# Patient Record
Sex: Female | Born: 1960
Health system: Southern US, Community
[De-identification: ages and names within clinical notes are randomized; demographics above are authoritative.]

## PROBLEM LIST (undated history)

## (undated) DIAGNOSIS — D219 Benign neoplasm of connective and other soft tissue, unspecified: Secondary | ICD-10-CM

## (undated) DIAGNOSIS — M1990 Unspecified osteoarthritis, unspecified site: Secondary | ICD-10-CM

## (undated) DIAGNOSIS — Z87898 Personal history of other specified conditions: Secondary | ICD-10-CM

## (undated) DIAGNOSIS — E119 Type 2 diabetes mellitus without complications: Secondary | ICD-10-CM

## (undated) DIAGNOSIS — Z8619 Personal history of other infectious and parasitic diseases: Secondary | ICD-10-CM

## (undated) DIAGNOSIS — N200 Calculus of kidney: Secondary | ICD-10-CM

## (undated) DIAGNOSIS — D649 Anemia, unspecified: Secondary | ICD-10-CM

## (undated) DIAGNOSIS — R519 Headache, unspecified: Secondary | ICD-10-CM

## (undated) DIAGNOSIS — K219 Gastro-esophageal reflux disease without esophagitis: Secondary | ICD-10-CM

## (undated) DIAGNOSIS — K828 Other specified diseases of gallbladder: Secondary | ICD-10-CM

## (undated) DIAGNOSIS — Z87440 Personal history of urinary (tract) infections: Secondary | ICD-10-CM

## (undated) DIAGNOSIS — C50911 Malignant neoplasm of unspecified site of right female breast: Secondary | ICD-10-CM

## (undated) DIAGNOSIS — Z87442 Personal history of urinary calculi: Secondary | ICD-10-CM

## (undated) DIAGNOSIS — E039 Hypothyroidism, unspecified: Secondary | ICD-10-CM

## (undated) DIAGNOSIS — L219 Seborrheic dermatitis, unspecified: Secondary | ICD-10-CM

## (undated) DIAGNOSIS — E079 Disorder of thyroid, unspecified: Secondary | ICD-10-CM

## (undated) DIAGNOSIS — I10 Essential (primary) hypertension: Secondary | ICD-10-CM

## (undated) DIAGNOSIS — R42 Dizziness and giddiness: Secondary | ICD-10-CM

## (undated) HISTORY — DX: Seborrheic dermatitis, unspecified: L21.9

## (undated) HISTORY — DX: Type 2 diabetes mellitus without complications: E11.9

## (undated) HISTORY — PX: BREAST SURGERY: SHX581

## (undated) HISTORY — DX: Essential (primary) hypertension: I10

## (undated) HISTORY — PX: COLONOSCOPY: SHX174

## (undated) HISTORY — DX: Personal history of urinary calculi: Z87.442

## (undated) HISTORY — PX: TUBAL LIGATION: SHX77

## (undated) HISTORY — DX: Personal history of urinary (tract) infections: Z87.440

## (undated) HISTORY — DX: Dizziness and giddiness: R42

## (undated) HISTORY — DX: Personal history of other infectious and parasitic diseases: Z86.19

## (undated) HISTORY — DX: Benign neoplasm of connective and other soft tissue, unspecified: D21.9

## (undated) HISTORY — PX: LITHOTRIPSY: SUR834

## (undated) HISTORY — DX: Disorder of thyroid, unspecified: E07.9

## (undated) HISTORY — DX: Personal history of other specified conditions: Z87.898

---

## 2001-12-17 HISTORY — PX: LAPAROSCOPIC CHOLECYSTECTOMY: SUR755

## 2004-12-17 HISTORY — PX: VAGINAL HYSTERECTOMY: SUR661

## 2008-02-12 ENCOUNTER — Ambulatory Visit: Payer: Self-pay | Admitting: Family Medicine

## 2008-02-12 DIAGNOSIS — L219 Seborrheic dermatitis, unspecified: Secondary | ICD-10-CM

## 2008-02-12 DIAGNOSIS — I10 Essential (primary) hypertension: Secondary | ICD-10-CM | POA: Insufficient documentation

## 2008-05-07 ENCOUNTER — Telehealth: Payer: Self-pay | Admitting: Family Medicine

## 2008-05-17 ENCOUNTER — Ambulatory Visit: Payer: Self-pay | Admitting: Family Medicine

## 2008-05-17 DIAGNOSIS — R51 Headache: Secondary | ICD-10-CM

## 2008-05-17 DIAGNOSIS — R519 Headache, unspecified: Secondary | ICD-10-CM | POA: Insufficient documentation

## 2008-05-17 DIAGNOSIS — M545 Low back pain: Secondary | ICD-10-CM

## 2008-05-18 LAB — CONVERTED CEMR LAB
Albumin: 4 g/dL (ref 3.5–5.2)
BUN: 18 mg/dL (ref 6–23)
Basophils Relative: 0.3 % (ref 0.0–1.0)
CO2: 31 meq/L (ref 19–32)
Calcium: 9.2 mg/dL (ref 8.4–10.5)
Chloride: 104 meq/L (ref 96–112)
Cholesterol: 146 mg/dL (ref 0–200)
Eosinophils Absolute: 0.2 10*3/uL (ref 0.0–0.7)
GFR calc non Af Amer: 64 mL/min
HCT: 42.7 % (ref 36.0–46.0)
Hemoglobin: 15 g/dL (ref 12.0–15.0)
Lymphocytes Relative: 22.7 % (ref 12.0–46.0)
MCHC: 35.2 g/dL (ref 30.0–36.0)
MCV: 93.1 fL (ref 78.0–100.0)
RBC: 4.59 M/uL (ref 3.87–5.11)
RDW: 12.5 % (ref 11.5–14.6)
Total Bilirubin: 1 mg/dL (ref 0.3–1.2)
Triglycerides: 45 mg/dL (ref 0–149)
WBC: 5.7 10*3/uL (ref 4.5–10.5)

## 2008-06-24 ENCOUNTER — Ambulatory Visit: Payer: Self-pay | Admitting: Family Medicine

## 2008-07-29 ENCOUNTER — Telehealth (INDEPENDENT_AMBULATORY_CARE_PROVIDER_SITE_OTHER): Payer: Self-pay | Admitting: *Deleted

## 2009-04-29 ENCOUNTER — Telehealth: Payer: Self-pay | Admitting: Internal Medicine

## 2009-06-16 ENCOUNTER — Ambulatory Visit: Payer: Self-pay | Admitting: Family Medicine

## 2009-06-21 LAB — CONVERTED CEMR LAB
Albumin: 3.9 g/dL (ref 3.5–5.2)
Alkaline Phosphatase: 58 units/L (ref 39–117)
BUN: 15 mg/dL (ref 6–23)
CO2: 32 meq/L (ref 19–32)
Chloride: 102 meq/L (ref 96–112)
Creatinine, Ser: 0.9 mg/dL (ref 0.4–1.2)
GFR calc non Af Amer: 71.47 mL/min (ref >60)
Lymphocytes Relative: 20.6 % (ref 12.0–46.0)
Neutro Abs: 5.1 10*3/uL (ref 1.4–7.7)
Neutrophils Relative %: 69.4 % (ref 43.0–77.0)
Potassium: 4.2 meq/L (ref 3.5–5.1)
RDW: 13.2 % (ref 11.5–14.6)
Sodium: 141 meq/L (ref 135–145)
TSH: 2.97 microintl units/mL (ref 0.35–5.50)
WBC: 7.4 10*3/uL (ref 4.5–10.5)

## 2009-07-12 ENCOUNTER — Encounter: Admission: RE | Admit: 2009-07-12 | Discharge: 2009-07-12 | Payer: Self-pay | Admitting: Obstetrics and Gynecology

## 2009-07-15 ENCOUNTER — Telehealth: Payer: Self-pay | Admitting: Family Medicine

## 2009-07-15 ENCOUNTER — Ambulatory Visit: Payer: Self-pay | Admitting: Family Medicine

## 2009-07-15 DIAGNOSIS — R3915 Urgency of urination: Secondary | ICD-10-CM | POA: Insufficient documentation

## 2009-07-15 LAB — CONVERTED CEMR LAB
Bacteria, UA: 0
Bilirubin Urine: NEGATIVE
Casts: 0 /lpf
Nitrite: NEGATIVE
RBC / HPF: 0
Specific Gravity, Urine: >=1.03
Urine crystals, microscopic: 0 /hpf
Urobilinogen, UA: 0.2
WBC Urine, dipstick: NEGATIVE
WBC, UA: 0 cells/hpf
Yeast, UA: 0

## 2009-08-01 ENCOUNTER — Ambulatory Visit: Payer: Self-pay | Admitting: Family Medicine

## 2009-08-01 DIAGNOSIS — R42 Dizziness and giddiness: Secondary | ICD-10-CM

## 2009-08-02 ENCOUNTER — Encounter: Payer: Self-pay | Admitting: Family Medicine

## 2009-08-24 ENCOUNTER — Encounter: Payer: Self-pay | Admitting: Family Medicine

## 2009-09-21 ENCOUNTER — Ambulatory Visit: Payer: Self-pay | Admitting: Urology

## 2009-09-21 ENCOUNTER — Encounter: Payer: Self-pay | Admitting: Family Medicine

## 2010-06-15 ENCOUNTER — Telehealth: Payer: Self-pay | Admitting: Family Medicine

## 2010-07-07 ENCOUNTER — Ambulatory Visit: Payer: Self-pay | Admitting: Family Medicine

## 2010-07-07 DIAGNOSIS — B009 Herpesviral infection, unspecified: Secondary | ICD-10-CM | POA: Insufficient documentation

## 2010-07-10 LAB — CONVERTED CEMR LAB
AST: 19 units/L (ref 0–37)
Albumin: 3.9 g/dL (ref 3.5–5.2)
Alkaline Phosphatase: 66 units/L (ref 39–117)
BUN: 13 mg/dL (ref 6–23)
Basophils Absolute: 0 10*3/uL (ref 0.0–0.1)
Bilirubin, Direct: 0.2 mg/dL (ref 0.0–0.3)
CO2: 32 meq/L (ref 19–32)
Calcium: 9.2 mg/dL (ref 8.4–10.5)
Chloride: 103 meq/L (ref 96–112)
Cholesterol: 151 mg/dL (ref 0–200)
Eosinophils Relative: 2.8 % (ref 0.0–5.0)
Glucose, Bld: 86 mg/dL (ref 70–99)
HDL: 51.8 mg/dL (ref >39.00)
Hemoglobin: 14.4 g/dL (ref 12.0–15.0)
LDL Cholesterol: 86 mg/dL (ref 0–99)
MCHC: 34.1 g/dL (ref 30.0–36.0)
Phosphorus: 2.6 mg/dL (ref 2.3–4.6)
RDW: 13.6 % (ref 11.5–14.6)
Total Bilirubin: 0.4 mg/dL (ref 0.3–1.2)
Total CHOL/HDL Ratio: 3
Triglycerides: 67 mg/dL (ref 0.0–149.0)
VLDL: 13.4 mg/dL (ref 0.0–40.0)

## 2010-07-28 ENCOUNTER — Encounter: Admission: RE | Admit: 2010-07-28 | Discharge: 2010-07-28 | Payer: Self-pay | Admitting: Obstetrics and Gynecology

## 2011-01-16 NOTE — Assessment & Plan Note (Signed)
Summary: MED REFILL/DLO   Vital Signs:  Patient profile:   50 year old female Height:      64 inches Weight:      200.75 pounds BMI:     34.58 Temp:     98.1 degrees F oral Pulse rate:   68 / minute Pulse rhythm:   regular BP sitting:   104 / 72  (left arm) Cuff size:   regular  Vitals Entered By: Lewanda Rife LPN (July 07, 2010 8:24 AM) CC: Med refills   History of Present Illness: here for f/u of HTN   wt is up 16 lb due to family and chaos  now plans to loose wt since she does not have to cook for them does some exercise -- does some aerobics and walking    is on benicar hct no problems with control - bp is 104/72 today is due for some labs  also on med for gerd   has been doing well overall  working a lot  finally got spare family to move out of her house   Allergies (verified): No Known Drug Allergies  Past History:  Past Surgical History: Last updated: 05/17/2008 CCY 2003 hyst 2006 - ? 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 [employer]  G3P3 walks the dog  2 grown children in [place], one in [employer] in [place]  Past Medical History: GERD anemia hx of HTN kidney stones- lithotripsy in past for L sided stone hx of R pelvic pain/back pain --- neg eval by gyn and urologist-- and back eval by primary care in past  frequent utis  hx of abn pap- has had leep in past  fever blisters - valtrex seborrheic dermatitis- local on R scalp and in ear (failed mult meds- uses steroid spray) intermittent vertigo  gyn-- Dr Vickey Sages  urologist - [place] - Victorino Dike (prev Dr Wanda Plump)   Review of Systems General:  Denies fatigue, fever, loss of appetite, and malaise. Eyes:  Denies blurring and eye irritation. CV:  Denies chest pain  or discomfort and lightheadness. Resp:  Denies cough, shortness of breath, and wheezing. GI:  Denies abdominal pain, change in bowel habits, and indigestion. GU:  Complains of urinary frequency; denies dysuria; on enablex from her Janetta Hora- works well. MS:  Denies muscle aches and cramps. Derm:  Denies itching, poor wound healing, and rash; small cyst on chest comes and goes. Neuro:  Denies numbness and tingling. Psych:  Denies anxiety and depression. Endo:  Denies cold intolerance, excessive thirst, excessive urination, and heat intolerance. Heme:  Denies abnormal bruising and bleeding.  Physical Exam  General:  overweight but generally well appearing  Head:  normocephalic, atraumatic, and no abnormalities observed.   Eyes:  vision grossly intact, pupils equal, pupils round, and pupils reactive to light.   Mouth:  pharynx pink and moist.   Neck:  supple with full rom and no masses or thyromegally, no JVD or carotid bruit  Lungs:  Normal respiratory effort, chest expands symmetrically. Lungs are clear to auscultation, no crackles or wheezes. Heart:  Normal rate and regular rhythm. S1 and S2 normal without gallop, murmur, click, rub or other extra sounds. Abdomen:  no renal bruits  Msk:  No deformity or scoliosis noted of thoracic or lumbar spine.   Pulses:  R and L carotid,radial,femoral,dorsalis pedis and posterior tibial pulses are full and equal bilaterally Extremities:  No clubbing, cyanosis, edema, or deformity noted with normal full range of motion of all joints.   Neurologic:  sensation intact to light touch, gait normal, and DTRs symmetrical and normal.   Skin:  Intact without suspicious lesions or rashes small cyst mid chest - <1cm and soft  lentigos diffusely  Cervical Nodes:  No lymphadenopathy noted Psych:  normal affect, talkative and pleasant    Impression & Recommendations:  Problem # 1:  ESSENTIAL HYPERTENSION (ICD-401.9) Assessment Improved  this is improved with  benicar  disc lifestyle change and need for wt loss- pt is ready to get started  lab today and update Her updated medication list for this problem includes:    Benicar Hct 20-12.5 Mg Tabs (Olmesartan medoxomil-hctz) .[name] Kitchen... Take one by mouth daily  Orders: Venipuncture (16109) TLB-Lipid Panel (80061-LIPID) TLB-Renal Function Panel (80069-RENAL) TLB-CBC Platelet - w/Differential (85025-CBCD) TLB-Hepatic/Liver Function Pnl (80076-HEPATIC) TLB-TSH (Thyroid Stimulating Hormone) (60454-UJW) Prescription Created Electronically [phone])  BP today: 104/72 Prior BP: 112/78 (08/01/2009)  Labs Reviewed: K+: 4.2 (06/16/2009) Creat: : 0.9 (06/16/2009)   Chol: 146 (05/17/2008)   HDL: 48.6 (05/17/2008)   LDL: 88 (05/17/2008)   TG: 45 (05/17/2008)  Problem # 2:  COLD SORE (ICD-054.9) Assessment: Unchanged  occ cold sores - valtrex works well- px written  urged to use sunscreen in her lip products   Orders: Prescription Created Electronically [phone])  Complete Medication List: 1)  Omeprazole 20 Mg Cpdr (Omeprazole) .... Take one by mouth  daily in am 2)  Valtrex 500 Mg Tabs (Valacyclovir hcl) .[name] Kitchen.. 1 by mouth two times a day as needed cold sore 3)  Benicar Hct 20-12.5 Mg Tabs (Olmesartan medoxomil-hctz) .... Take one by mouth daily 4)  Kenalog Aers (Triamcinolone acetonide) .... Use one spray on affected area two times a day as needed 5)  Excedrin Migraine 250-250-65 Mg Tabs (Aspirin-acetaminophen-caffeine) .... Otc as directed for headache. 6)  Antivert 25 Mg Tabs (Meclizine hcl) .[name] Kitchen.. 1 by mouth up to three times a day as needed dizziness  warn- can sedate 7)  Enablex 7.5 Mg Xr24h-tab (Darifenacin hydrobromide) .... Take one twice a week as needed.  Patient Instructions: 1)  no change in medicine- I sent it to your pharmacy  2)  labs today 3)  work hard on diet and exercise  Prescriptions: VALTREX 500 MG  TABS (VALACYCLOVIR HCL) 1 by mouth two times a day as needed cold sore  #30 x 3    Entered and Authorized by:   Judith Part MD   Signed by:   Judith Part MD on 07/07/2010   Method used:   Electronically to        [id number]       [address]       [place], [place]  [address]       Ph: [phone]       Fax: [phone]   RxID:   [id number] BENICAR HCT 20-12.5 MG  TABS (OLMESARTAN MEDOXOMIL-HCTZ) Take one by mouth daily  #30 x 11   Entered and Authorized by:   Judith Part MD   Signed by:   Judith Part MD on 07/07/2010   Method used:   Electronically to        [id number] ([facility]       [address]       [place], [place]  [address]       Ph: [phone]       Fax: [phone]   RxID:   [id number]   Current Allergies (reviewed today): No known allergies

## 2011-01-16 NOTE — Progress Notes (Signed)
Summary: refill request for valtrex  Phone Note Refill Request Call back at [phone] Message from:  Patient  Refills Requested: Medication #1:  VALTREX 500 MG  TABS 1 by mouth two times a day as needed cold sore Pt is going on vacation and is asking for a refill to be sent to [facility].  Initial call taken by: Lowella Petties CMA,  June 15, 2010 12:24 PM  Follow-up for Phone Call        px written on EMR for call in  Follow-up by: Judith Part MD,  June 15, 2010 1:11 PM    Prescriptions: VALTREX 500 MG  TABS (VALACYCLOVIR HCL) 1 by mouth two times a day as needed cold sore  #30 x 0   Entered by:   Delilah Shan CMA (AAMA)   Authorized by:   Judith Part MD   Signed by:   Delilah Shan CMA (AAMA) on 06/15/2010   Method used:   Electronically to        [id number] ([facility]       [address]       [place], [place]  [address]       Ph: [phone]       Fax: [phone]   RxID:   [id number]

## 2011-03-09 ENCOUNTER — Other Ambulatory Visit: Payer: Self-pay | Admitting: Family Medicine

## 2011-03-09 NOTE — Telephone Encounter (Signed)
That is ok to refil She uses infrequency for her seb dermatitis Will do px order

## 2011-04-30 ENCOUNTER — Other Ambulatory Visit: Payer: Self-pay | Admitting: *Deleted

## 2011-04-30 MED ORDER — VALACYCLOVIR HCL 500 MG PO TABS: 500.0000 mg | ORAL_TABLET | Freq: Two times a day (BID) | ORAL | Status: AC | PRN

## 2011-04-30 NOTE — Telephone Encounter (Signed)
[phone] as instructed.

## 2011-04-30 NOTE — Telephone Encounter (Signed)
Yes Px written for call in

## 2011-04-30 NOTE — Telephone Encounter (Signed)
Can this be refilled? 

## 2011-06-01 ENCOUNTER — Telehealth: Payer: Self-pay | Admitting: *Deleted

## 2011-06-01 NOTE — Telephone Encounter (Signed)
Thanks -- done and in IN box Please add norvasc to her intolerance/ all list in epic--thanks

## 2011-06-01 NOTE — Telephone Encounter (Signed)
Pt has taken BP med before but she could not remember the name. I checked in [facility] and 06/24/08 Norvasc 5mg  was stopped due to causing swelling. Pt said she had no allergies to her knowledge.

## 2011-06-01 NOTE — Telephone Encounter (Signed)
I have the form Please ask her if she has been on other med for HTN in the past or has all to something I will hold for to finish-thanks

## 2011-06-01 NOTE — Telephone Encounter (Signed)
Completed form faxed to [phone] as instructed. Norvasc added to pt's allergy and intolerance list of meds as instructed. Form sent to be scanned.

## 2011-06-01 NOTE — Telephone Encounter (Signed)
Prior Berkley Harvey is needed for benicar, per [employer]'s step therapy plan,  form is on your shelf.

## 2011-06-11 ENCOUNTER — Other Ambulatory Visit: Payer: Self-pay | Admitting: *Deleted

## 2011-06-12 ENCOUNTER — Telehealth: Payer: Self-pay | Admitting: *Deleted

## 2011-06-12 MED ORDER — OLMESARTAN MEDOXOMIL-HCTZ 20-12.5 MG PO TABS: 1.0000 | ORAL_TABLET | Freq: Every day | ORAL | Status: DC

## 2011-06-12 NOTE — Telephone Encounter (Signed)
Pt states she received a letter stating prior auth for benicar was denied by her insurance. They are requiring that she try their preferred meds- losartan, irbesartan, enalapril, benazepril, captopril, fosinopril, lisinopril, ramipril.  [facility].  Please let pt know.

## 2011-06-12 NOTE — Telephone Encounter (Signed)
Called pharmacy so they could notify patient that she needs a follow up appt for further refills.

## 2011-06-12 NOTE — Telephone Encounter (Signed)
Needs follow up with Dr. Milinda Antis for another refill

## 2011-06-12 NOTE — Telephone Encounter (Signed)
Received a phone call from Daphine from [facility] stating that prior auth is needed for Benicar or Dr. Milinda Antis can change Rx altogether.  Please advise.

## 2011-06-13 MED ORDER — LOSARTAN POTASSIUM-HCTZ 50-12.5 MG PO TABS: 1.0000 | ORAL_TABLET | Freq: Every day | ORAL | Status: DC

## 2011-06-13 NOTE — Telephone Encounter (Signed)
Pt said Losartan is OK with her. Pt is going out of town over the weekend and Wynelle Link is her last day of med. Pt will ck with [facility] on Friday morning 06/15/11 and if med is not there pt will call our office. Pt understands Dr Milinda Antis is out of the office this week.

## 2011-06-13 NOTE — Telephone Encounter (Signed)
I would switch her to losartan hct (hyzaar)- please ask pt if that is ok before I do it --- because I do not think the benicar will get covered  If she is ok with it - I will go ahead and px

## 2011-06-13 NOTE — Telephone Encounter (Signed)
Addended by: Roxy Manns A on: 06/13/2011 04:16 PM   Modules accepted: Orders

## 2011-06-13 NOTE — Telephone Encounter (Signed)
I sent new px electronically Needs bp check here (or visit if she has not been seen in a year) in about a month to see how it is working

## 2011-06-13 NOTE — Telephone Encounter (Signed)
Patient notified as instructed by telephone. Pt said she would call back for appt to see Dr Milinda Antis for BP f/u when she returns from pt's vacation.

## 2012-05-27 ENCOUNTER — Ambulatory Visit (INDEPENDENT_AMBULATORY_CARE_PROVIDER_SITE_OTHER): Payer: BC Managed Care – HMO | Admitting: Family Medicine

## 2012-05-27 ENCOUNTER — Encounter: Payer: Self-pay | Admitting: Family Medicine

## 2012-05-27 ENCOUNTER — Telehealth: Payer: Self-pay

## 2012-05-27 VITALS — BP 102/72 | HR 74 | Temp 97.8°F | Ht 65.0 in | Wt 218.5 lb

## 2012-05-27 DIAGNOSIS — N318 Other neuromuscular dysfunction of bladder: Secondary | ICD-10-CM

## 2012-05-27 DIAGNOSIS — I10 Essential (primary) hypertension: Secondary | ICD-10-CM

## 2012-05-27 DIAGNOSIS — L309 Dermatitis, unspecified: Secondary | ICD-10-CM | POA: Insufficient documentation

## 2012-05-27 DIAGNOSIS — M79672 Pain in left foot: Secondary | ICD-10-CM

## 2012-05-27 DIAGNOSIS — M79609 Pain in unspecified limb: Secondary | ICD-10-CM

## 2012-05-27 DIAGNOSIS — L259 Unspecified contact dermatitis, unspecified cause: Secondary | ICD-10-CM

## 2012-05-27 DIAGNOSIS — N3281 Overactive bladder: Secondary | ICD-10-CM

## 2012-05-27 LAB — CBC WITH DIFFERENTIAL/PLATELET
Basophils Absolute: 0 10*3/uL (ref 0.0–0.1)
Eosinophils Absolute: 0.1 10*3/uL (ref 0.0–0.7)
Hemoglobin: 14.6 g/dL (ref 12.0–15.0)
MCHC: 33.6 g/dL (ref 30.0–36.0)
Monocytes Absolute: 0.5 10*3/uL (ref 0.1–1.0)
Monocytes Relative: 8.9 % (ref 3.0–12.0)
Neutro Abs: 3.9 10*3/uL (ref 1.4–7.7)
Platelets: 214 10*3/uL (ref 150.0–400.0)
RBC: 4.71 Mil/uL (ref 3.87–5.11)
WBC: 6.1 10*3/uL (ref 4.5–10.5)

## 2012-05-27 LAB — COMPREHENSIVE METABOLIC PANEL
ALT: 38 U/L — ABNORMAL HIGH (ref 0–35)
Alkaline Phosphatase: 67 U/L (ref 39–117)
BUN: 19 mg/dL (ref 6–23)
CO2: 29 mEq/L (ref 19–32)
Calcium: 8.9 mg/dL (ref 8.4–10.5)
Chloride: 104 mEq/L (ref 96–112)
GFR: 78.59 mL/min (ref >60.00)
Potassium: 4 mEq/L (ref 3.5–5.1)
Sodium: 140 mEq/L (ref 135–145)
Total Protein: 7.2 g/dL (ref 6.0–8.3)

## 2012-05-27 LAB — LIPID PANEL
Triglycerides: 65 mg/dL (ref 0.0–149.0)
VLDL: 13 mg/dL (ref 0.0–40.0)

## 2012-05-27 MED ORDER — VALACYCLOVIR HCL 500 MG PO TABS: 500.0000 mg | ORAL_TABLET | Freq: Two times a day (BID) | ORAL | Status: DC | PRN

## 2012-05-27 MED ORDER — TRIAMCINOLONE ACETONIDE 0.147 MG/GM EX AERS: INHALATION_SPRAY | Freq: Two times a day (BID) | CUTANEOUS | Status: DC

## 2012-05-27 MED ORDER — LOSARTAN POTASSIUM-HCTZ 50-12.5 MG PO TABS: 1.0000 | ORAL_TABLET | Freq: Every day | ORAL | Status: DC

## 2012-05-27 MED ORDER — DARIFENACIN HYDROBROMIDE ER 7.5 MG PO TB24: 7.5000 mg | ORAL_TABLET | Freq: Every day | ORAL | Status: DC

## 2012-05-27 NOTE — Assessment & Plan Note (Signed)
Arch pain with ankle sprain and poor physics from old leg fx On mobic-much improved May end up needing orthotics Urged to stop flip flops Will refer to Dr Patsy Lager if pain returns

## 2012-05-27 NOTE — Assessment & Plan Note (Signed)
Controlled with hyzaar bp in fair control at this time  No changes needed  Disc lifstyle change with low sodium diet and exercise  Lab today PE in nov

## 2012-05-27 NOTE — Assessment & Plan Note (Signed)
occ on back of head Refilled kenalog spray that works well for her  No active areas today

## 2012-05-27 NOTE — Assessment & Plan Note (Signed)
refil enablex- she uses prn -works well Neg urology w/u in past

## 2012-05-27 NOTE — Telephone Encounter (Signed)
Enablex and Kenalog too expensive and pt request different meds called to [facility].Please advise.

## 2012-05-27 NOTE — Progress Notes (Signed)
Subjective:    Patient ID: Diana Deleon, female    DOB: April 14, 1961, 51 y.o.   MRN: [id number]  HPI Here for f/u of chronic problems and new foot pain  Went to urgent care for foot pain - jabbing and burning sensation in arch of L foot (1-2 mo)_, felt like a nerve problem That comes and goes  Then 2 wk ago twisted her ankle when she tripped  That was waking up the middle of the night with arch pain and a little ankle pain  They gave her mobic at urgent care  Did xray- no fracture  Interestingly - did have old fx of L leg in HS -- and decided that did not heal well    Wt is up 16 lb  bmi is 34  prilosec for gerd- that works very well   Valtrex for cold sores- takes prn and that helps a lot   Losartan- hct for htn bp is  102/72   Today No cp or palpitations or headaches or edema  No side effects to medicines   ? Last labs Is over due for labs   Has PE appt for nov   Also needs refil of enablex for her overactive bladder - and takes it intermittently Had neg urol w/u with Dr Wanda Plump - then he left the practice    Patient Active Problem List  Diagnoses  . COLD SORE  . ESSENTIAL HYPERTENSION  . DERMATITIS, SEBORRHEIC  . LOW BACK PAIN, CHRONIC  . VERTIGO  . HEADACHE, CHRONIC  . URINARY URGENCY   No past medical history on file. No past surgical history on file. History  Substance Use Topics  . Smoking status: Never Smoker   . Smokeless tobacco: Not on file  . Alcohol Use: Not on file   No family history on file. Allergies  Allergen Reactions  . Norvasc (Amlodipine Besylate) Swelling   Current Outpatient Prescriptions on File Prior to Visit  Medication Sig Dispense Refill  . KENALOG topical spray USE 1 SPRAY ON AFFECTED AREA TWICE A DAY AS NEEDED  63 g  1  . losartan-hydrochlorothiazide (HYZAAR) 50-12.5 MG per tablet Take 1 tablet by mouth daily.  30 tablet  11  . omeprazole (PRILOSEC OTC) 20 MG tablet Take 20 mg by mouth daily.         Review of  Systems Review of Systems  Constitutional: Negative for fever, appetite change, fatigue and unexpected weight change.  Eyes: Negative for pain and visual disturbance.  Respiratory: Negative for cough and shortness of breath.   Cardiovascular: Negative for cp or palpitations    Gastrointestinal: Negative for nausea, diarrhea and constipation.  Genitourinary: Negative for urgency and frequency.  Skin: Negative for pallor or rash   MSK pos for foot pain that is much improved with meloxicam  Neurological: Negative for weakness, light-headedness, numbness and headaches.  Hematological: Negative for adenopathy. Does not bruise/bleed easily.  Psychiatric/Behavioral: Negative for dysphoric mood. The patient is not nervous/anxious.         Objective:   Physical Exam  Constitutional: She appears well-developed and well-nourished. No distress.       Obese and well appearing   HENT:  Head: Normocephalic and atraumatic.  Mouth/Throat: Oropharynx is clear and moist.  Eyes: Conjunctivae and EOM are normal. Pupils are equal, round, and reactive to light. No scleral icterus.  Neck: Normal range of motion. Neck supple. No JVD present. Carotid bruit is not present. No thyromegaly present.  Cardiovascular: Normal rate, regular  rhythm, normal heart sounds and intact distal pulses.  Exam reveals no gallop.   Pulmonary/Chest: Effort normal and breath sounds normal. No respiratory distress. She has no wheezes.  Abdominal: Soft. Bowel sounds are normal. She exhibits no distension, no abdominal bruit and no mass. There is no tenderness.  Musculoskeletal: Normal range of motion. She exhibits no edema and no tenderness.       No deformity or tenderness of L foot today Nl gait and rom   Lymphadenopathy:    She has no cervical adenopathy.  Neurological: She is alert. She has normal strength and normal reflexes. She displays no atrophy and no tremor. No sensory deficit. She exhibits normal muscle tone.  Coordination normal.  Skin: Skin is warm and dry. No rash noted. No erythema. No pallor.  Psychiatric: She has a normal mood and affect.          Assessment & Plan:

## 2012-05-27 NOTE — Telephone Encounter (Signed)
Ask her to call her insurance co and tell them what she is on - have them tell her what is preferred with her plan and get back to me so I can px  thanks

## 2012-05-27 NOTE — Patient Instructions (Signed)
I sent medicines to pharmacy If foot pain re occurs - please call for appt with Dr Patsy Lager See you in November (you will  Not need labs before your appt)

## 2012-05-28 NOTE — Telephone Encounter (Signed)
Left vm for pt to callback 

## 2012-05-30 NOTE — Telephone Encounter (Signed)
Left vm for pt to callback 

## 2012-06-02 NOTE — Telephone Encounter (Signed)
Left vm for pt to callback 

## 2012-06-03 ENCOUNTER — Encounter: Payer: Self-pay | Admitting: Family Medicine

## 2012-06-03 ENCOUNTER — Ambulatory Visit (INDEPENDENT_AMBULATORY_CARE_PROVIDER_SITE_OTHER): Payer: BC Managed Care – HMO | Admitting: Family Medicine

## 2012-06-03 VITALS — BP 120/74 | HR 73 | Temp 97.5°F | Ht 65.0 in | Wt 220.8 lb

## 2012-06-03 DIAGNOSIS — R7989 Other specified abnormal findings of blood chemistry: Secondary | ICD-10-CM

## 2012-06-03 DIAGNOSIS — E039 Hypothyroidism, unspecified: Secondary | ICD-10-CM | POA: Insufficient documentation

## 2012-06-03 DIAGNOSIS — R6889 Other general symptoms and signs: Secondary | ICD-10-CM

## 2012-06-03 NOTE — Assessment & Plan Note (Signed)
Very slt elevation of alt at 38- suspect poss fatty liver plus recent use of mobic Will watch this  Pt working on wt loss No abd pain or other symptoms S/p ccy in past

## 2012-06-03 NOTE — Progress Notes (Signed)
Subjective:    Patient ID: Diana Deleon, female    DOB: 04-17-1961, 51 y.o.   MRN: [id number]  HPI Here for f/u of labs  tsh is slt high at 5.89 Has felt bloated lately  Has gained some weight in the past few months   (even with change in eating habits)  Alt is 38 Never had liver tests in the past  fam hx of cirrhosis of liver- pos hepatitis in gmother  No tylenol use for the most part  Almost no alcohol  Right now takes mobic for foot which is better   Patient Active Problem List  Diagnosis  . COLD SORE  . ESSENTIAL HYPERTENSION  . DERMATITIS, SEBORRHEIC  . LOW BACK PAIN, CHRONIC  . VERTIGO  . HEADACHE, CHRONIC  . URINARY URGENCY  . Left foot pain  . Overactive bladder  . Dermatitis  . Abnormal TSH   No past medical history on file. No past surgical history on file. History  Substance Use Topics  . Smoking status: Never Smoker   . Smokeless tobacco: Not on file  . Alcohol Use: Not on file   No family history on file. Allergies  Allergen Reactions  . Norvasc (Amlodipine Besylate) Swelling   Current Outpatient Prescriptions on File Prior to Visit  Medication Sig Dispense Refill  . losartan-hydrochlorothiazide (HYZAAR) 50-12.5 MG per tablet Take 1 tablet by mouth daily.  30 tablet  11  . meloxicam (MOBIC) 15 MG tablet Take 15 mg by mouth daily as needed.      [name] Kitchen omeprazole (PRILOSEC OTC) 20 MG tablet Take 20 mg by mouth daily.      . valACYclovir (VALTREX) 500 MG tablet Take 1 tablet (500 mg total) by mouth 2 (two) times daily as needed. Cold Sores.  30 tablet  0  . darifenacin (ENABLEX) 7.5 MG 24 hr tablet Take 1 tablet (7.5 mg total) by mouth daily.  30 tablet  11  . triamcinolone (KENALOG) topical spray Apply topically 2 (two) times daily. To affected area  63 g  1      Review of Systems Review of Systems  Constitutional: Negative for fever, appetite change,  and unexpected weight change. neg for goiter  Eyes: Negative for pain and visual disturbance.    Respiratory: Negative for cough and shortness of breath.   Cardiovascular: Negative for cp or palpitations    Gastrointestinal: Negative for nausea, diarrhea and constipation. neg for abd pain or jaundice  Genitourinary: Negative for urgency and frequency.  Skin: Negative for pallor or rash   Neurological: Negative for weakness, light-headedness, numbness and headaches.  Hematological: Negative for adenopathy. Does not bruise/bleed easily.  Psychiatric/Behavioral: Negative for dysphoric mood. The patient is not nervous/anxious.         Objective:   Physical Exam  Constitutional: She appears well-developed and well-nourished. No distress.  HENT:  Head: Normocephalic and atraumatic.  Mouth/Throat: Oropharynx is clear and moist.  Eyes: Conjunctivae and EOM are normal. Pupils are equal, round, and reactive to light. No scleral icterus.  Neck: Normal range of motion. Neck supple. No thyromegaly present.  Cardiovascular: Normal rate and regular rhythm.   Pulmonary/Chest: Effort normal and breath sounds normal.  Abdominal: Soft. Bowel sounds are normal. She exhibits no distension and no mass. There is no tenderness.       No HSM   Musculoskeletal: She exhibits no edema.  Neurological: She is alert. She has normal reflexes. No cranial nerve deficit. She exhibits normal muscle tone. Coordination normal.  Skin: Skin is warm and dry. No rash noted. No erythema. No pallor.       No jaundice   Psychiatric: She has a normal mood and affect.          Assessment & Plan:

## 2012-06-03 NOTE — Patient Instructions (Addendum)
Keep working on weight loss for liver health and overall health  If abdominal pain or other symptoms let me know  Thyroid labs today  Avoid over the counter herbs and medicines as much as you can  Call your insurance about the enablex and the kenalog spray --- find out what drugs are covered better for each one and let me know

## 2012-06-03 NOTE — Assessment & Plan Note (Signed)
Mildly high tsh - disc poss of hypothyroidism  Disc symptoms  Check thyroid profile today Will tx if needed

## 2012-06-04 LAB — T4, FREE: Free T4: 0.9 ng/dL (ref 0.60–1.60)

## 2012-06-04 LAB — TSH: TSH: 3.36 u[IU]/mL (ref 0.35–5.50)

## 2012-06-04 NOTE — Telephone Encounter (Signed)
Pt seen by Dr Milinda Antis 06/03/12 and Dr Milinda Antis informed pt to contact insurance co.

## 2012-06-05 ENCOUNTER — Encounter: Payer: Self-pay | Admitting: *Deleted

## 2012-07-11 ENCOUNTER — Other Ambulatory Visit: Payer: Self-pay | Admitting: Obstetrics and Gynecology

## 2012-07-11 DIAGNOSIS — R928 Other abnormal and inconclusive findings on diagnostic imaging of breast: Secondary | ICD-10-CM

## 2012-08-05 ENCOUNTER — Other Ambulatory Visit: Payer: Self-pay | Admitting: Obstetrics and Gynecology

## 2012-08-05 ENCOUNTER — Ambulatory Visit
Admission: RE | Admit: 2012-08-05 | Discharge: 2012-08-05 | Disposition: A | Discharge status: Home / Self Care | Payer: BC Managed Care – HMO | Source: Ambulatory Visit | Attending: Obstetrics and Gynecology | Admitting: Obstetrics and Gynecology

## 2012-08-05 DIAGNOSIS — R928 Other abnormal and inconclusive findings on diagnostic imaging of breast: Secondary | ICD-10-CM

## 2012-10-12 ENCOUNTER — Telehealth: Payer: Self-pay | Admitting: Family Medicine

## 2012-10-12 DIAGNOSIS — Z0000 Encounter for general adult medical examination without abnormal findings: Secondary | ICD-10-CM | POA: Insufficient documentation

## 2012-10-12 NOTE — Telephone Encounter (Signed)
Message copied by Judy Pimple on Sun Oct 12, 2012  5:47 PM ------      Message from: Alvina Chou      Created: Wed Oct 08, 2012  2:59 PM      Regarding: Lab orders for Monday 10.28.13       Patient is scheduled for CPX labs, please order future labs, Thanks , Camelia Eng

## 2012-10-13 ENCOUNTER — Other Ambulatory Visit (INDEPENDENT_AMBULATORY_CARE_PROVIDER_SITE_OTHER): Payer: BC Managed Care – HMO

## 2012-10-13 DIAGNOSIS — Z0000 Encounter for general adult medical examination without abnormal findings: Secondary | ICD-10-CM

## 2012-10-13 LAB — CBC WITH DIFFERENTIAL/PLATELET
Basophils Absolute: 0 10*3/uL (ref 0.0–0.1)
Basophils Relative: 0.3 % (ref 0.0–3.0)
Eosinophils Absolute: 0.1 10*3/uL (ref 0.0–0.7)
Eosinophils Relative: 1.9 % (ref 0.0–5.0)
HCT: 45 % (ref 36.0–46.0)
Hemoglobin: 15.1 g/dL — ABNORMAL HIGH (ref 12.0–15.0)
Lymphocytes Relative: 23.6 % (ref 12.0–46.0)
Lymphs Abs: 1.7 10*3/uL (ref 0.7–4.0)
MCHC: 33.7 g/dL (ref 30.0–36.0)
MCV: 92.6 fl (ref 78.0–100.0)
Monocytes Absolute: 0.4 10*3/uL (ref 0.1–1.0)
Monocytes Relative: 6.2 % (ref 3.0–12.0)
Neutro Abs: 4.8 10*3/uL (ref 1.4–7.7)
Neutrophils Relative %: 68 % (ref 43.0–77.0)
Platelets: 244 10*3/uL (ref 150.0–400.0)
RBC: 4.86 Mil/uL (ref 3.87–5.11)
RDW: 12.9 % (ref 11.5–14.6)
WBC: 7.1 10*3/uL (ref 4.5–10.5)

## 2012-10-13 LAB — COMPREHENSIVE METABOLIC PANEL
Albumin: 3.6 g/dL (ref 3.5–5.2)
Alkaline Phosphatase: 67 U/L (ref 39–117)
CO2: 31 mEq/L (ref 19–32)
Chloride: 101 mEq/L (ref 96–112)
GFR: 63.88 mL/min (ref >60.00)
Potassium: 3.4 mEq/L — ABNORMAL LOW (ref 3.5–5.1)
Total Bilirubin: 0.5 mg/dL (ref 0.3–1.2)
Total Protein: 7.3 g/dL (ref 6.0–8.3)

## 2012-10-13 LAB — LIPID PANEL
Cholesterol: 174 mg/dL (ref 0–200)
HDL: 47.4 mg/dL (ref >39.00)
LDL Cholesterol: 93 mg/dL (ref 0–99)
Total CHOL/HDL Ratio: 4
Triglycerides: 170 mg/dL — ABNORMAL HIGH (ref 0.0–149.0)
VLDL: 34 mg/dL (ref 0.0–40.0)

## 2012-10-13 LAB — TSH: TSH: 3.96 u[IU]/mL (ref 0.35–5.50)

## 2012-10-17 ENCOUNTER — Encounter: Payer: Self-pay | Admitting: *Deleted

## 2012-10-20 ENCOUNTER — Encounter: Payer: Self-pay | Admitting: Family Medicine

## 2012-10-20 DIAGNOSIS — Z0289 Encounter for other administrative examinations: Secondary | ICD-10-CM

## 2013-04-28 ENCOUNTER — Other Ambulatory Visit: Payer: Self-pay | Admitting: *Deleted

## 2013-04-28 MED ORDER — LOSARTAN POTASSIUM-HCTZ 50-12.5 MG PO TABS: 1.0000 | ORAL_TABLET | Freq: Every day | ORAL | Status: DC

## 2013-07-06 ENCOUNTER — Telehealth: Payer: Self-pay | Admitting: Family Medicine

## 2013-07-06 DIAGNOSIS — Z0000 Encounter for general adult medical examination without abnormal findings: Secondary | ICD-10-CM

## 2013-07-06 NOTE — Telephone Encounter (Signed)
Message copied by Judy Pimple on Mon Jul 06, 2013  9:33 PM ------      Message from: Alvina Chou      Created: Tue Jun 30, 2013 11:07 AM      Regarding: Lab orders for Tuesday, 7.22.14       Patient is scheduled for CPX labs, please order future labs, Thanks , Terri       ------

## 2013-07-07 ENCOUNTER — Other Ambulatory Visit (INDEPENDENT_AMBULATORY_CARE_PROVIDER_SITE_OTHER): Payer: BC Managed Care – HMO

## 2013-07-07 DIAGNOSIS — Z0000 Encounter for general adult medical examination without abnormal findings: Secondary | ICD-10-CM

## 2013-07-07 LAB — TSH: TSH: 5.86 u[IU]/mL — ABNORMAL HIGH (ref 0.35–5.50)

## 2013-07-07 LAB — CBC WITH DIFFERENTIAL/PLATELET
Basophils Absolute: 0 10*3/uL (ref 0.0–0.1)
Eosinophils Absolute: 0.1 10*3/uL (ref 0.0–0.7)
Hemoglobin: 14.5 g/dL (ref 12.0–15.0)
Lymphocytes Relative: 19.3 % (ref 12.0–46.0)
MCHC: 34.5 g/dL (ref 30.0–36.0)
Neutro Abs: 4.5 10*3/uL (ref 1.4–7.7)
Neutrophils Relative %: 72.1 % (ref 43.0–77.0)
Platelets: 236 10*3/uL (ref 150.0–400.0)
RDW: 12.9 % (ref 11.5–14.6)

## 2013-07-07 LAB — COMPREHENSIVE METABOLIC PANEL
ALT: 19 U/L (ref 0–35)
AST: 18 U/L (ref 0–37)
Calcium: 8.9 mg/dL (ref 8.4–10.5)
Chloride: 102 mEq/L (ref 96–112)
Creatinine, Ser: 0.9 mg/dL (ref 0.4–1.2)
Sodium: 137 mEq/L (ref 135–145)
Total Protein: 7.2 g/dL (ref 6.0–8.3)

## 2013-07-07 LAB — LIPID PANEL
HDL: 57.8 mg/dL (ref >39.00)
Total CHOL/HDL Ratio: 3
VLDL: 28.6 mg/dL (ref 0.0–40.0)

## 2013-07-09 DIAGNOSIS — N890 Mild vaginal dysplasia: Secondary | ICD-10-CM | POA: Insufficient documentation

## 2013-07-14 ENCOUNTER — Encounter: Payer: Self-pay | Admitting: Family Medicine

## 2013-07-14 ENCOUNTER — Ambulatory Visit (INDEPENDENT_AMBULATORY_CARE_PROVIDER_SITE_OTHER): Payer: BC Managed Care – HMO | Admitting: Family Medicine

## 2013-07-14 VITALS — BP 128/86 | HR 104 | Temp 98.2°F | Ht 64.25 in | Wt 214.5 lb

## 2013-07-14 DIAGNOSIS — R739 Hyperglycemia, unspecified: Secondary | ICD-10-CM

## 2013-07-14 DIAGNOSIS — I10 Essential (primary) hypertension: Secondary | ICD-10-CM

## 2013-07-14 DIAGNOSIS — Z0000 Encounter for general adult medical examination without abnormal findings: Secondary | ICD-10-CM

## 2013-07-14 DIAGNOSIS — R7309 Other abnormal glucose: Secondary | ICD-10-CM

## 2013-07-14 DIAGNOSIS — R7989 Other specified abnormal findings of blood chemistry: Secondary | ICD-10-CM

## 2013-07-14 DIAGNOSIS — R6889 Other general symptoms and signs: Secondary | ICD-10-CM

## 2013-07-14 DIAGNOSIS — E119 Type 2 diabetes mellitus without complications: Secondary | ICD-10-CM | POA: Insufficient documentation

## 2013-07-14 NOTE — Progress Notes (Signed)
Subjective:    Patient ID: Diana Deleon, female    DOB: 03/09/1961, 52 y.o.   MRN: [id number]  HPI Here for health maintenance exam and to review chronic medical problems    Working a lot this year  Doing ok overall   Wt is down 6 lb with bmi of 36  More vertigo lately  Esp when turning over in bed  Not severe- and does not last all day ? If related to her new hormones    Went to gyn - last Thursday and had exam - still doing pap smears  On HRT for hot flashes now - along with a baby aspirin and she does not smoke Understands risks  Had partial hysterectomy in 06 for fibroids   Mammogram had on Thursday  Self exam-no new lumps  Hx of fibrocystic breasts   Colon cancer screening - last colonoscopy years ago for some blood in stool (hemorrhoids) Given a stool card at the gyn  She is not quite ready for colonoscopy    Flu vaccine - did not get this fall - forgot   Td 7/10  Mood -ok , no depression or loss of interest   Hx of high tsh Lab Results  Component Value Date   TSH 5.86* 07/07/2013  no wt gain (lost 6 lb) She has had dry skin all of her life  Energy level is low with menopause   Stress- daughter moved in with her 2 small children    Last thyroid profile nl- watching   Lab Results  Component Value Date   WBC 6.3 07/07/2013   HGB 14.5 07/07/2013   HCT 42.0 07/07/2013   MCV 90.6 07/07/2013   PLT 236.0 07/07/2013    Lab Results  Component Value Date   CHOL 183 07/07/2013   CHOL 174 10/13/2012   CHOL 166 05/27/2012   Lab Results  Component Value Date   HDL 57.80 07/07/2013   HDL 69.62 10/13/2012   HDL 60.80 05/27/2012   Lab Results  Component Value Date   LDLCALC 97 07/07/2013   LDLCALC 93 10/13/2012   LDLCALC 92 05/27/2012   Lab Results  Component Value Date   TRIG 143.0 07/07/2013   TRIG 170.0* 10/13/2012   TRIG 65.0 05/27/2012   Lab Results  Component Value Date   CHOLHDL 3 07/07/2013   CHOLHDL 4 10/13/2012   CHOLHDL 3 05/27/2012   No  results found for this basename: LDLDIRECT   looks good !- overall profile - does watch diet   Blood glucose 143 fasting  Has a sweet tooth sometimes -not every day but loves cranberry juice   Exercises - walking 3-4 times per week   . Patient Active Problem List   Diagnosis Date Noted  . Routine general medical examination at a health care facility 10/12/2012  . Abnormal TSH 06/03/2012  . Elevated transaminase level 06/03/2012  . Left foot pain 05/27/2012  . Overactive bladder 05/27/2012  . Dermatitis 05/27/2012  . COLD SORE 07/07/2010  . VERTIGO 08/01/2009  . URINARY URGENCY 07/15/2009  . LOW BACK PAIN, CHRONIC 05/17/2008  . HEADACHE, CHRONIC 05/17/2008  . ESSENTIAL HYPERTENSION 02/12/2008  . DERMATITIS, SEBORRHEIC 02/12/2008   Past Medical History  Diagnosis Date  . History of gastroesophageal reflux (GERD)   . History of anemia   . History of kidney stones     lithotripsy in past for L sised stone  . History of recurrent UTIs   . History of abnormal Pap smear  has had leep in past  . History of cold sores     has used valtrex in past  . Seborrheic dermatitis     local on R scalp and in ear (failed mult meds- uses steroid spray)  . Intermittent vertigo   . Fibroids     hyst. 2006   Past Surgical History  Procedure Laterality Date  . Partial hysterectomy  2006    fibroids  . Cholecystectomy  2003   History  Substance Use Topics  . Smoking status: Never Smoker   . Smokeless tobacco: Not on file  . Alcohol Use: No   Family History  Problem Relation Age of Onset  . Alcohol abuse Father   . COPD Father   . Hypertension Father   . Diabetes Father   . Hypertension Mother    Allergies  Allergen Reactions  . Norvasc (Amlodipine Besylate) Swelling   Current Outpatient Prescriptions on File Prior to Visit  Medication Sig Dispense Refill  . omeprazole (PRILOSEC OTC) 20 MG tablet Take 20 mg by mouth daily.      . valACYclovir (VALTREX) 500 MG tablet Take 1  tablet (500 mg total) by mouth 2 (two) times daily as needed. Cold Sores.  30 tablet  0  . losartan-hydrochlorothiazide (HYZAAR) 50-12.5 MG per tablet Take 1 tablet by mouth daily.  90 tablet  0   No current facility-administered medications on file prior to visit.    Review of Systems Review of Systems  Constitutional: Negative for fever, appetite change, fatigue and unexpected weight change. pos for hot flashes  Eyes: Negative for pain and visual disturbance.  Respiratory: Negative for cough and shortness of breath.   Cardiovascular: Negative for cp or palpitations    Gastrointestinal: Negative for nausea, diarrhea and constipation.  Genitourinary: Negative for urgency and frequency.  Skin: Negative for pallor or rash   Neurological: Negative for weakness, light-headedness, numbness and headaches. pos for occas vertigo  Hematological: Negative for adenopathy. Does not bruise/bleed easily.  Psychiatric/Behavioral: Negative for dysphoric mood. The patient is not nervous/anxious.         Objective:   Physical Exam  Constitutional: She appears well-developed and well-nourished. No distress.  obese and well appearing   HENT:  Head: Normocephalic and atraumatic.  Right Ear: External ear normal.  Left Ear: External ear normal.  Nose: Nose normal.  Mouth/Throat: Oropharynx is clear and moist.  Eyes: Conjunctivae and EOM are normal. Pupils are equal, round, and reactive to light. Right eye exhibits no discharge. Left eye exhibits no discharge. No scleral icterus.  Neck: Normal range of motion. Neck supple. No JVD present. Carotid bruit is not present. No thyromegaly present.  Cardiovascular: Normal rate, regular rhythm, normal heart sounds and intact distal pulses.  Exam reveals no gallop.   Pulmonary/Chest: Effort normal and breath sounds normal. No respiratory distress. She has no wheezes. She has no rales. She exhibits no tenderness.  Abdominal: Soft. Bowel sounds are normal. She  exhibits no distension, no abdominal bruit and no mass. There is no tenderness.  Musculoskeletal: She exhibits no edema and no tenderness.  Lymphadenopathy:    She has no cervical adenopathy.  Neurological: She is alert. She has normal reflexes. No cranial nerve deficit. She exhibits normal muscle tone. Coordination normal.  Skin: Skin is warm and dry. No rash noted. No erythema. No pallor.  Psychiatric: She has a normal mood and affect.          Assessment & Plan:

## 2013-07-14 NOTE — Patient Instructions (Addendum)
Work hard on healthy diet and exercise for weight loss Cut out simple sugar and reduce carbohydrates Aim 5 days of exercise per week Follow up in 3 months for non fasting lab and then office visit  Do your stool test for gyn

## 2013-07-16 NOTE — Assessment & Plan Note (Signed)
Intermittent Last profile neg Pt unsure if she is symptomatic at this time  Will check thyroid prof in 3 mo and f/u

## 2013-07-16 NOTE — Assessment & Plan Note (Signed)
Sugar is mildly high Rev need for wt loss and low glycemic diet (rev this in detail)  Also exercise  F/u 3 mo and will check A1C- make plan - if over 6.5 will ref to DM teaching

## 2013-07-16 NOTE — Assessment & Plan Note (Signed)
bp in fair control at this time  No changes needed  Disc lifstyle change with low sodium diet and exercise  Lab rev

## 2013-07-16 NOTE — Assessment & Plan Note (Signed)
Reviewed health habits including diet and exercise and skin cancer prevention Also reviewed health mt list, fam hx and immunizations  Wellness lab rev in detail  Rev need for wt loss

## 2013-07-29 ENCOUNTER — Other Ambulatory Visit: Payer: Self-pay | Admitting: Family Medicine

## 2013-10-07 ENCOUNTER — Other Ambulatory Visit (INDEPENDENT_AMBULATORY_CARE_PROVIDER_SITE_OTHER): Payer: BC Managed Care – HMO

## 2013-10-07 DIAGNOSIS — R7309 Other abnormal glucose: Secondary | ICD-10-CM

## 2013-10-07 DIAGNOSIS — R739 Hyperglycemia, unspecified: Secondary | ICD-10-CM

## 2013-10-07 DIAGNOSIS — R6889 Other general symptoms and signs: Secondary | ICD-10-CM

## 2013-10-07 DIAGNOSIS — R7989 Other specified abnormal findings of blood chemistry: Secondary | ICD-10-CM

## 2013-10-07 LAB — TSH: TSH: 6.73 u[IU]/mL — ABNORMAL HIGH (ref 0.35–5.50)

## 2013-10-07 LAB — T4, FREE: Free T4: 0.95 ng/dL (ref 0.60–1.60)

## 2013-10-14 ENCOUNTER — Ambulatory Visit (INDEPENDENT_AMBULATORY_CARE_PROVIDER_SITE_OTHER): Payer: BC Managed Care – HMO | Admitting: Family Medicine

## 2013-10-14 ENCOUNTER — Encounter: Payer: Self-pay | Admitting: Family Medicine

## 2013-10-14 VITALS — BP 136/86 | HR 78 | Temp 98.4°F | Ht 64.25 in | Wt 218.5 lb

## 2013-10-14 DIAGNOSIS — R739 Hyperglycemia, unspecified: Secondary | ICD-10-CM

## 2013-10-14 DIAGNOSIS — R7309 Other abnormal glucose: Secondary | ICD-10-CM

## 2013-10-14 DIAGNOSIS — R6889 Other general symptoms and signs: Secondary | ICD-10-CM

## 2013-10-14 DIAGNOSIS — R7989 Other specified abnormal findings of blood chemistry: Secondary | ICD-10-CM

## 2013-10-14 DIAGNOSIS — Z23 Encounter for immunization: Secondary | ICD-10-CM

## 2013-10-14 MED ORDER — LEVOTHYROXINE SODIUM 25 MCG PO TABS: 25.0000 ug | ORAL_TABLET | Freq: Every day | ORAL | Status: DC

## 2013-10-14 NOTE — Progress Notes (Signed)
Subjective:    Patient ID: Diana Deleon, female    DOB: 05/08/1961, 52 y.o.   MRN: [id number]  HPI Here for f/u of chronic medical problems  Wt is up 4 lb with bmi of 37 -obese  Flu vaccine- wants to get it today   Hyperglycemia Last visit sugar was elevated Lab Results  Component Value Date   HGBA1C 5.3 10/07/2013   this is reassuring She has watched diet more carefully- did cut her bread intake quite a bit  Not a big sweet eater One soda per day - fountain drink -not diet  Takes care of grandkids- always on the go/ walking No exercise program - she wants to get started   Also has had abn tsh Lab Results  Component Value Date   TSH 6.73* 10/07/2013   Mildly high with nl free T4  She has dry skin baseline - no worse than usual She does feel sluggish-thought from schedule ?  Patient Active Problem List   Diagnosis Date Noted  . Hyperglycemia 07/14/2013  . Routine general medical examination at a health care facility 10/12/2012  . Abnormal TSH 06/03/2012  . Elevated transaminase level 06/03/2012  . Left foot pain 05/27/2012  . Overactive bladder 05/27/2012  . Dermatitis 05/27/2012  . COLD SORE 07/07/2010  . VERTIGO 08/01/2009  . URINARY URGENCY 07/15/2009  . LOW BACK PAIN, CHRONIC 05/17/2008  . HEADACHE, CHRONIC 05/17/2008  . ESSENTIAL HYPERTENSION 02/12/2008  . DERMATITIS, SEBORRHEIC 02/12/2008   Past Medical History  Diagnosis Date  . History of gastroesophageal reflux (GERD)   . History of anemia   . History of kidney stones     lithotripsy in past for L sised stone  . History of recurrent UTIs   . History of abnormal Pap smear     has had leep in past  . History of cold sores     has used valtrex in past  . Seborrheic dermatitis     local on R scalp and in ear (failed mult meds- uses steroid spray)  . Intermittent vertigo   . Fibroids     hyst. 2006   Past Surgical History  Procedure Laterality Date  . Partial hysterectomy  2006    fibroids  .  Cholecystectomy  2003   History  Substance Use Topics  . Smoking status: Never Smoker   . Smokeless tobacco: Not on file  . Alcohol Use: No   Family History  Problem Relation Age of Onset  . Alcohol abuse Father   . COPD Father   . Hypertension Father   . Diabetes Father   . Hypertension Mother    Allergies  Allergen Reactions  . Norvasc [Amlodipine Besylate] Swelling   Current Outpatient Prescriptions on File Prior to Visit  Medication Sig Dispense Refill  . estradiol (ESTRACE) 1 MG tablet Take 1 tablet by mouth daily.      [name] Kitchen losartan-hydrochlorothiazide (HYZAAR) 50-12.5 MG per tablet TAKE 1 TABLET BY MOUTH DAILY.  90 tablet  0  . omeprazole (PRILOSEC OTC) 20 MG tablet Take 20 mg by mouth daily.      . valACYclovir (VALTREX) 500 MG tablet Take 1 tablet (500 mg total) by mouth 2 (two) times daily as needed. Cold Sores.  30 tablet  0   No current facility-administered medications on file prior to visit.        Review of Systems Review of Systems  Constitutional: Negative for fever, appetite change,  and unexpected weight change. pos for fatigue Eyes:  Negative for pain and visual disturbance.  Respiratory: Negative for cough and shortness of breath.   Cardiovascular: Negative for cp or palpitations    Gastrointestinal: Negative for nausea, diarrhea and constipation.  Genitourinary: Negative for urgency and frequency.  Skin: Negative for pallor or rash pos for dry skin/ neg for hair loss   Neurological: Negative for weakness, light-headedness, numbness and headaches.  Hematological: Negative for adenopathy. Does not bruise/bleed easily.  Psychiatric/Behavioral: Negative for dysphoric mood. The patient is not nervous/anxious.         Objective:   Physical Exam  Constitutional: She appears well-developed and well-nourished. No distress.  obese and well appearing   HENT:  Head: Normocephalic and atraumatic.  Mouth/Throat: Oropharynx is clear and moist.  Eyes:  Conjunctivae and EOM are normal. Pupils are equal, round, and reactive to light. Right eye exhibits no discharge. Left eye exhibits no discharge. No scleral icterus.  Neck: Normal range of motion. Neck supple. No JVD present. No thyromegaly present.  Cardiovascular: Normal rate, regular rhythm, normal heart sounds and intact distal pulses.  Exam reveals no gallop.   Pulmonary/Chest: Effort normal and breath sounds normal. No respiratory distress. She has no wheezes. She has no rales.  Musculoskeletal: She exhibits no edema and no tenderness.  Lymphadenopathy:    She has no cervical adenopathy.  Neurological: She is alert. She has normal reflexes. No cranial nerve deficit. She exhibits normal muscle tone. Coordination normal.  Skin: Skin is warm and dry. No rash noted. No erythema. No pallor.  Psychiatric: She has a normal mood and affect.          Assessment & Plan:

## 2013-10-14 NOTE — Patient Instructions (Signed)
For borderline high blood sugar-watch sugar in diet/ and starches Give up the soda if you can  Flu vaccine today If you are interested in a shingles/zoster vaccine - call your insurance to check on coverage,( you should not get it within 1 month of other vaccines) , then call us for a prescription  for it to take to a pharmacy that gives the shot , or make a nurse visit to get it here depending on your coverage  For hypothyroidism (subclinical)- start generic synthroid one pill daily Schedule non fasting lab in about 6 weeks and we will watch thyroid function

## 2013-10-15 NOTE — Assessment & Plan Note (Signed)
Pt has symptoms Lab Results  Component Value Date   TSH 6.73* 10/07/2013   Start synthroid generic 25 mcg Re check 6 wk update

## 2013-10-15 NOTE — Assessment & Plan Note (Signed)
Lab Results  Component Value Date   HGBA1C 5.3 10/07/2013   Counseled on wt loss/ risk of DM and low glycemic diet Disc exercise plan

## 2013-10-29 ENCOUNTER — Other Ambulatory Visit: Payer: Self-pay | Admitting: Family Medicine

## 2013-11-25 ENCOUNTER — Other Ambulatory Visit: Payer: BC Managed Care – HMO

## 2013-11-25 ENCOUNTER — Other Ambulatory Visit (INDEPENDENT_AMBULATORY_CARE_PROVIDER_SITE_OTHER): Payer: BC Managed Care – HMO

## 2013-11-25 ENCOUNTER — Encounter: Payer: Self-pay | Admitting: Family Medicine

## 2013-11-25 ENCOUNTER — Ambulatory Visit (INDEPENDENT_AMBULATORY_CARE_PROVIDER_SITE_OTHER): Payer: BC Managed Care – HMO | Admitting: Family Medicine

## 2013-11-25 VITALS — BP 128/86 | HR 79 | Temp 98.3°F | Ht 64.25 in | Wt 213.5 lb

## 2013-11-25 DIAGNOSIS — J069 Acute upper respiratory infection, unspecified: Secondary | ICD-10-CM | POA: Insufficient documentation

## 2013-11-25 DIAGNOSIS — R6889 Other general symptoms and signs: Secondary | ICD-10-CM

## 2013-11-25 DIAGNOSIS — R7989 Other specified abnormal findings of blood chemistry: Secondary | ICD-10-CM

## 2013-11-25 LAB — TSH: TSH: 2.27 u[IU]/mL (ref 0.35–5.50)

## 2013-11-25 NOTE — Progress Notes (Signed)
Subjective:    Patient ID: Diana Deleon, female    DOB: [birth year], 52 y.o.   MRN: [id number]  HPI Here for uri symptoms Started on Sunday (after a GI virus thurs or Friday)  She is congested and coughing and having headache (like a toothache or earache)  Taking excedrin Sinus pressure in am   Mucous - ? Color , she does not look at it   No fever   Husband was recently sick -now tx for a sinus infection  Patient Active Problem List   Diagnosis Date Noted  . Viral URI with cough 11/25/2013  . Hyperglycemia 07/14/2013  . Routine general medical examination at a health care facility 10/12/2012  . Subclinical hypothyroidism 06/03/2012  . Elevated transaminase level 06/03/2012  . Left foot pain 05/27/2012  . Overactive bladder 05/27/2012  . Dermatitis 05/27/2012  . COLD SORE 07/07/2010  . VERTIGO 08/01/2009  . URINARY URGENCY 07/15/2009  . LOW BACK PAIN, CHRONIC 05/17/2008  . HEADACHE, CHRONIC 05/17/2008  . ESSENTIAL HYPERTENSION 02/12/2008  . DERMATITIS, SEBORRHEIC 02/12/2008   Past Medical History  Diagnosis Date  . History of gastroesophageal reflux (GERD)   . History of anemia   . History of kidney stones     lithotripsy in past for L sised stone  . History of recurrent UTIs   . History of abnormal Pap smear     has had leep in past  . History of cold sores     has used valtrex in past  . Seborrheic dermatitis     local on R scalp and in ear (failed mult meds- uses steroid spray)  . Intermittent vertigo   . Fibroids     hyst. 2006   Past Surgical History  Procedure Laterality Date  . Partial hysterectomy  2006    fibroids  . Cholecystectomy  2003   History  Substance Use Topics  . Smoking status: Never Smoker   . Smokeless tobacco: Not on file  . Alcohol Use: No   Family History  Problem Relation Age of Onset  . Alcohol abuse Father   . COPD Father   . Hypertension Father   . Diabetes Father   . Hypertension Mother    Allergies  Allergen  Reactions  . Norvasc [Amlodipine Besylate] Swelling   Current Outpatient Prescriptions on File Prior to Visit  Medication Sig Dispense Refill  . aspirin 81 MG tablet Take 81 mg by mouth daily.      [name] Kitchen estradiol (ESTRACE) 1 MG tablet Take 1 tablet by mouth daily.      [name] Kitchen levothyroxine (SYNTHROID, LEVOTHROID) 25 MCG tablet Take 1 tablet (25 mcg total) by mouth daily before breakfast.  30 tablet  11  . losartan-hydrochlorothiazide (HYZAAR) 50-12.5 MG per tablet TAKE 1 TABLET BY MOUTH DAILY.  90 tablet  1  . omeprazole (PRILOSEC OTC) 20 MG tablet Take 20 mg by mouth daily.      . valACYclovir (VALTREX) 500 MG tablet Take 1 tablet (500 mg total) by mouth 2 (two) times daily as needed. Cold Sores.  30 tablet  0   No current facility-administered medications on file prior to visit.    Review of Systems    Review of Systems  Constitutional: Negative for fever, appetite change,  and unexpected weight change.  ENT pos for cong/rhinorrhea and drip and sinus pressure Eyes: Negative for pain and visual disturbance.  Respiratory: Negative for wheeze and shortness of breath.   Cardiovascular: Negative for cp or palpitations  Gastrointestinal: Negative for nausea, diarrhea and constipation.  Genitourinary: Negative for urgency and frequency.  Skin: Negative for pallor or rash   Neurological: Negative for weakness, light-headedness, numbness and headaches.  Hematological: Negative for adenopathy. Does not bruise/bleed easily.  Psychiatric/Behavioral: Negative for dysphoric mood. The patient is not nervous/anxious.      Objective:   Physical Exam  Constitutional: She appears well-developed and well-nourished. No distress.  obese and well appearing   HENT:  Head: Normocephalic and atraumatic.  Right Ear: External ear normal.  Left Ear: External ear normal.  Mouth/Throat: Oropharynx is clear and moist. No oropharyngeal exudate.  Nares are injected and congested  No sinus tenderness Clear  rhinorrhea   Eyes: Conjunctivae and EOM are normal. Pupils are equal, round, and reactive to light. Right eye exhibits no discharge. Left eye exhibits no discharge. No scleral icterus.  Neck: Normal range of motion. Neck supple.  Cardiovascular: Normal rate and regular rhythm.   Pulmonary/Chest: Effort normal and breath sounds normal. No respiratory distress. She has no wheezes. She has no rales.  Lymphadenopathy:    She has no cervical adenopathy.  Neurological: She is alert.  Skin: Skin is warm and dry. No rash noted.  Psychiatric: She has a normal mood and affect.          Assessment & Plan:

## 2013-11-25 NOTE — Assessment & Plan Note (Signed)
Disc symptomatic care - see instructions on AVS Will try mucinex DM Update if not starting to improve in a week or if worsening  - disc s/s of sinusitis to watch for in detail Handout given

## 2013-11-25 NOTE — Patient Instructions (Signed)
I think you have a viral head and chest cold  Drink lots of fluids/ rest / breathe steam and try nasal saline spray and mucinex DM for congestion and cough  excedrin or ibuprofen are ok for fever and headache  If worse or not improving in 7 days- especially if sinus pain or fever- let us know Upper Respiratory Infection, Adult An upper respiratory infection (URI) is also sometimes known as the common cold. The upper respiratory tract includes the nose, sinuses, throat, trachea, and bronchi. Bronchi are the airways leading to the lungs. Most people improve within 1 week, but symptoms can last up to 2 weeks. A residual cough may last even longer.  CAUSES Many different viruses can infect the tissues lining the upper respiratory tract. The tissues become irritated and inflamed and often become very moist. Mucus production is also common. A cold is contagious. You can easily spread the virus to others by oral contact. This includes kissing, sharing a glass, coughing, or sneezing. Touching your mouth or nose and then touching a surface, which is then touched by another person, can also spread the virus. SYMPTOMS  Symptoms typically develop 1 to 3 days after you come in contact with a cold virus. Symptoms vary from person to person. They may include:  Runny nose.  Sneezing.  Nasal congestion.  Sinus irritation.  Sore throat.  Loss of voice (laryngitis).  Cough.  Fatigue.  Muscle aches.  Loss of appetite.  Headache.  Low-grade fever. DIAGNOSIS  You might diagnose your own cold based on familiar symptoms, since most people get a cold 2 to 3 times a year. Your caregiver can confirm this based on your exam. Most importantly, your caregiver can check that your symptoms are not due to another disease such as strep throat, sinusitis, pneumonia, asthma, or epiglottitis. Blood tests, throat tests, and X-rays are not necessary to diagnose a common cold, but they may sometimes be helpful in  excluding other more serious diseases. Your caregiver will decide if any further tests are required. RISKS AND COMPLICATIONS  You may be at risk for a more severe case of the common cold if you smoke cigarettes, have chronic heart disease (such as heart failure) or lung disease (such as asthma), or if you have a weakened immune system. The very young and very old are also at risk for more serious infections. Bacterial sinusitis, middle ear infections, and bacterial pneumonia can complicate the common cold. The common cold can worsen asthma and chronic obstructive pulmonary disease (COPD). Sometimes, these complications can require emergency medical care and may be life-threatening. PREVENTION  The best way to protect against getting a cold is to practice good hygiene. Avoid oral or hand contact with people with cold symptoms. Wash your hands often if contact occurs. There is no clear evidence that vitamin C, vitamin E, echinacea, or exercise reduces the chance of developing a cold. However, it is always recommended to get plenty of rest and practice good nutrition. TREATMENT  Treatment is directed at relieving symptoms. There is no cure. Antibiotics are not effective, because the infection is caused by a virus, not by bacteria. Treatment may include:  Increased fluid intake. Sports drinks offer valuable electrolytes, sugars, and fluids.  Breathing heated mist or steam (vaporizer or shower).  Eating chicken soup or other clear broths, and maintaining good nutrition.  Getting plenty of rest.  Using gargles or lozenges for comfort.  Controlling fevers with ibuprofen or acetaminophen as directed by your caregiver.  Increasing  usage of your inhaler if you have asthma. Zinc gel and zinc lozenges, taken in the first 24 hours of the common cold, can shorten the duration and lessen the severity of symptoms. Pain medicines may help with fever, muscle aches, and throat pain. A variety of non-prescription  medicines are available to treat congestion and runny nose. Your caregiver can make recommendations and may suggest nasal or lung inhalers for other symptoms.  HOME CARE INSTRUCTIONS   Only take over-the-counter or prescription medicines for pain, discomfort, or fever as directed by your caregiver.  Use a warm mist humidifier or inhale steam from a shower to increase air moisture. This may keep secretions moist and make it easier to breathe.  Drink enough water and fluids to keep your urine clear or pale yellow.  Rest as needed.  Return to work when your temperature has returned to normal or as your caregiver advises. You may need to stay home longer to avoid infecting others. You can also use a face mask and careful hand washing to prevent spread of the virus. SEEK MEDICAL CARE IF:   After the first few days, you feel you are getting worse rather than better.  You need your caregiver's advice about medicines to control symptoms.  You develop chills, worsening shortness of breath, or brown or red sputum. These may be signs of pneumonia.  You develop yellow or brown nasal discharge or pain in the face, especially when you bend forward. These may be signs of sinusitis.  You develop a fever, swollen neck glands, pain with swallowing, or white areas in the back of your throat. These may be signs of strep throat. SEEK IMMEDIATE MEDICAL CARE IF:   You have a fever.  You develop severe or persistent headache, ear pain, sinus pain, or chest pain.  You develop wheezing, a prolonged cough, cough up blood, or have a change in your usual mucus (if you have chronic lung disease).  You develop sore muscles or a stiff neck. Document Released: 05/29/2001 Document Revised: 02/25/2012 Document Reviewed: 04/06/2011 [facility] Patient Information 2014 [place], [place].

## 2013-11-25 NOTE — Progress Notes (Signed)
Pre-visit discussion using our clinic review tool. No additional management support is needed unless otherwise documented below in the visit note.  

## 2013-11-26 ENCOUNTER — Encounter: Payer: Self-pay | Admitting: *Deleted

## 2013-12-17 DIAGNOSIS — C50911 Malignant neoplasm of unspecified site of right female breast: Secondary | ICD-10-CM

## 2013-12-17 HISTORY — DX: Malignant neoplasm of unspecified site of right female breast: C50.911

## 2014-01-04 ENCOUNTER — Encounter: Payer: Self-pay | Admitting: Family Medicine

## 2014-01-04 ENCOUNTER — Ambulatory Visit (INDEPENDENT_AMBULATORY_CARE_PROVIDER_SITE_OTHER): Payer: BC Managed Care – HMO | Admitting: Family Medicine

## 2014-01-04 VITALS — BP 128/84 | HR 105 | Temp 98.3°F | Ht 64.25 in | Wt 214.0 lb

## 2014-01-04 DIAGNOSIS — B9789 Other viral agents as the cause of diseases classified elsewhere: Principal | ICD-10-CM

## 2014-01-04 DIAGNOSIS — J069 Acute upper respiratory infection, unspecified: Secondary | ICD-10-CM

## 2014-01-04 NOTE — Patient Instructions (Signed)
Drink lots of fluids and get extra rest if you can Continue mucinex DM Update if not starting to improve in a week or if worsening  - if fever over 101 or body aches or shortness of breath let me know

## 2014-01-04 NOTE — Progress Notes (Signed)
Subjective:    Patient ID: Diana Deleon, female    DOB: 1961-08-18, 53 y.o.   MRN: [id number]  HPI Here for cough   Got better from uri in dec -only for short time  Then was exp to some sick kids Started coughing - productive - mucous is clear  No wheezing - but she can hear herself breathing  No fever - temp ok at home   Throat is a bit raw from coughing  Ears are ok  Had a headache-that went away Not a lot of nasal symptoms so far   She was exp to flu   Patient Active Problem List   Diagnosis Date Noted  . Viral URI with cough 11/25/2013  . Hyperglycemia 07/14/2013  . Routine general medical examination at a health care facility 10/12/2012  . Subclinical hypothyroidism 06/03/2012  . Elevated transaminase level 06/03/2012  . Left foot pain 05/27/2012  . Overactive bladder 05/27/2012  . Dermatitis 05/27/2012  . COLD SORE 07/07/2010  . VERTIGO 08/01/2009  . URINARY URGENCY 07/15/2009  . LOW BACK PAIN, CHRONIC 05/17/2008  . HEADACHE, CHRONIC 05/17/2008  . ESSENTIAL HYPERTENSION 02/12/2008  . DERMATITIS, SEBORRHEIC 02/12/2008   Past Medical History  Diagnosis Date  . History of gastroesophageal reflux (GERD)   . History of anemia   . History of kidney stones     lithotripsy in past for L sised stone  . History of recurrent UTIs   . History of abnormal Pap smear     has had leep in past  . History of cold sores     has used valtrex in past  . Seborrheic dermatitis     local on R scalp and in ear (failed mult meds- uses steroid spray)  . Intermittent vertigo   . Fibroids     hyst. 2006   Past Surgical History  Procedure Laterality Date  . Partial hysterectomy  2006    fibroids  . Cholecystectomy  2003   History  Substance Use Topics  . Smoking status: Never Smoker   . Smokeless tobacco: Not on file  . Alcohol Use: No   Family History  Problem Relation Age of Onset  . Alcohol abuse Father   . COPD Father   . Hypertension Father   . Diabetes Father     . Hypertension Mother    Allergies  Allergen Reactions  . Norvasc [Amlodipine Besylate] Swelling   Current Outpatient Prescriptions on File Prior to Visit  Medication Sig Dispense Refill  . aspirin 81 MG tablet Take 81 mg by mouth daily.      [name] Kitchen estradiol (ESTRACE) 1 MG tablet Take 1 tablet by mouth daily.      [name] Kitchen levothyroxine (SYNTHROID, LEVOTHROID) 25 MCG tablet Take 1 tablet (25 mcg total) by mouth daily before breakfast.  30 tablet  11  . losartan-hydrochlorothiazide (HYZAAR) 50-12.5 MG per tablet TAKE 1 TABLET BY MOUTH DAILY.  90 tablet  1  . omeprazole (PRILOSEC OTC) 20 MG tablet Take 20 mg by mouth daily.      . valACYclovir (VALTREX) 500 MG tablet Take 1 tablet (500 mg total) by mouth 2 (two) times daily as needed. Cold Sores.  30 tablet  0   No current facility-administered medications on file prior to visit.    Review of Systems Review of Systems  Constitutional: Negative for fever, appetite change,  and unexpected weight change.  ENT pos for ear fullness and post nasal drip Eyes: Negative for pain and visual disturbance.  Respiratory: Negative for wheeze and shortness of breath.   Cardiovascular: Negative for cp or palpitations    Gastrointestinal: Negative for nausea, diarrhea and constipation.  Genitourinary: Negative for urgency and frequency.  Skin: Negative for pallor or rash   Neurological: Negative for weakness, light-headedness, numbness and headaches.  Hematological: Negative for adenopathy. Does not bruise/bleed easily.  Psychiatric/Behavioral: Negative for dysphoric mood. The patient is not nervous/anxious.         Objective:   Physical Exam  Constitutional: She appears well-developed and well-nourished. No distress.  obese and well appearing   HENT:  Head: Normocephalic and atraumatic.  Right Ear: External ear normal.  Left Ear: External ear normal.  Mouth/Throat: Oropharynx is clear and moist. No oropharyngeal exudate.  Nares are boggy- mild clear  rhinorrhea No sinus tenderness   Eyes: Conjunctivae and EOM are normal. Pupils are equal, round, and reactive to light. Right eye exhibits no discharge. Left eye exhibits no discharge.  Neck: Normal range of motion. Neck supple.  Cardiovascular: Regular rhythm and normal heart sounds.   Pulmonary/Chest: Effort normal and breath sounds normal. No respiratory distress. She has no wheezes. She has no rales. She exhibits no tenderness.  Lymphadenopathy:    She has no cervical adenopathy.  Neurological: She is alert.  Skin: Skin is warm and dry. No rash noted.  Psychiatric: She has a normal mood and affect.          Assessment & Plan:

## 2014-01-04 NOTE — Progress Notes (Signed)
Pre-visit discussion using our clinic review tool. No additional management support is needed unless otherwise documented below in the visit note.  

## 2014-01-04 NOTE — Assessment & Plan Note (Signed)
Prod cough/no fever and reassuring exam  Disc symptomatic care - see instructions on AVS  Update if not starting to improve in a week or if worsening  -esp if fever or worse cough

## 2014-05-02 ENCOUNTER — Other Ambulatory Visit: Payer: Self-pay | Admitting: Family Medicine

## 2014-05-05 ENCOUNTER — Other Ambulatory Visit: Payer: Self-pay | Admitting: Family Medicine

## 2014-08-13 ENCOUNTER — Other Ambulatory Visit: Payer: Self-pay | Admitting: Family Medicine

## 2014-08-13 NOTE — Telephone Encounter (Signed)
Please refill for 6 mo  Schedule a PE when appt is avail

## 2014-08-13 NOTE — Telephone Encounter (Signed)
Electronic refill request, no recent/future appt., please advise  

## 2014-09-26 ENCOUNTER — Other Ambulatory Visit: Payer: Self-pay | Admitting: Family Medicine

## 2014-09-27 NOTE — Telephone Encounter (Signed)
Electronic refill request, no recent/future appt., please advise  

## 2014-09-27 NOTE — Telephone Encounter (Signed)
done

## 2014-09-27 NOTE — Telephone Encounter (Signed)
Please refill for 6 mo 

## 2014-10-01 ENCOUNTER — Other Ambulatory Visit: Payer: Self-pay | Admitting: Obstetrics and Gynecology

## 2014-10-01 DIAGNOSIS — R928 Other abnormal and inconclusive findings on diagnostic imaging of breast: Secondary | ICD-10-CM

## 2014-10-13 ENCOUNTER — Encounter (INDEPENDENT_AMBULATORY_CARE_PROVIDER_SITE_OTHER): Payer: Self-pay

## 2014-10-13 ENCOUNTER — Ambulatory Visit
Admission: RE | Admit: 2014-10-13 | Discharge: 2014-10-13 | Disposition: A | Discharge status: Home / Self Care | Payer: BC Managed Care – HMO | Source: Ambulatory Visit | Attending: Obstetrics and Gynecology | Admitting: Obstetrics and Gynecology

## 2014-10-13 ENCOUNTER — Other Ambulatory Visit: Payer: Self-pay | Admitting: Obstetrics and Gynecology

## 2014-10-13 DIAGNOSIS — R928 Other abnormal and inconclusive findings on diagnostic imaging of breast: Secondary | ICD-10-CM

## 2014-10-13 DIAGNOSIS — N6310 Unspecified lump in the right breast, unspecified quadrant: Secondary | ICD-10-CM

## 2014-10-18 ENCOUNTER — Other Ambulatory Visit: Payer: Self-pay | Admitting: Obstetrics and Gynecology

## 2014-10-18 DIAGNOSIS — N6310 Unspecified lump in the right breast, unspecified quadrant: Secondary | ICD-10-CM

## 2014-10-20 ENCOUNTER — Ambulatory Visit
Admission: RE | Admit: 2014-10-20 | Discharge: 2014-10-20 | Disposition: A | Discharge status: Home / Self Care | Payer: BC Managed Care – HMO | Source: Ambulatory Visit | Attending: Obstetrics and Gynecology | Admitting: Obstetrics and Gynecology

## 2014-10-20 DIAGNOSIS — N6310 Unspecified lump in the right breast, unspecified quadrant: Secondary | ICD-10-CM

## 2014-10-21 ENCOUNTER — Other Ambulatory Visit: Payer: Self-pay | Admitting: Obstetrics and Gynecology

## 2014-10-21 DIAGNOSIS — C50911 Malignant neoplasm of unspecified site of right female breast: Secondary | ICD-10-CM

## 2014-10-22 ENCOUNTER — Ambulatory Visit
Admission: RE | Admit: 2014-10-22 | Discharge: 2014-10-22 | Disposition: A | Discharge status: Home / Self Care | Payer: BC Managed Care – HMO | Source: Ambulatory Visit | Attending: Obstetrics and Gynecology | Admitting: Obstetrics and Gynecology

## 2014-10-22 DIAGNOSIS — C50911 Malignant neoplasm of unspecified site of right female breast: Secondary | ICD-10-CM

## 2014-10-22 MED ORDER — GADOBENATE DIMEGLUMINE 529 MG/ML IV SOLN
19.0000 mL | Freq: Once | INTRAVENOUS | Status: AC | PRN
  Administered 2014-10-22: 19 mL via INTRAVENOUS

## 2014-10-25 ENCOUNTER — Telehealth: Payer: Self-pay | Admitting: *Deleted

## 2014-10-25 DIAGNOSIS — Z853 Personal history of malignant neoplasm of breast: Secondary | ICD-10-CM | POA: Insufficient documentation

## 2014-10-25 DIAGNOSIS — C50411 Malignant neoplasm of upper-outer quadrant of right female breast: Secondary | ICD-10-CM

## 2014-10-25 NOTE — Telephone Encounter (Signed)
Confirmed [facility] for 10/27/14 at 1230pm .  Instructions and contact information given.

## 2014-10-26 ENCOUNTER — Other Ambulatory Visit: Payer: Self-pay | Admitting: Obstetrics and Gynecology

## 2014-10-26 DIAGNOSIS — R928 Other abnormal and inconclusive findings on diagnostic imaging of breast: Secondary | ICD-10-CM

## 2014-10-27 ENCOUNTER — Encounter: Payer: Self-pay | Admitting: Radiation Oncology

## 2014-10-27 ENCOUNTER — Ambulatory Visit: Payer: BC Managed Care – HMO

## 2014-10-27 ENCOUNTER — Ambulatory Visit (HOSPITAL_BASED_OUTPATIENT_CLINIC_OR_DEPARTMENT_OTHER): Payer: BC Managed Care – HMO | Admitting: Hematology and Oncology

## 2014-10-27 ENCOUNTER — Encounter: Payer: Self-pay | Admitting: Hematology and Oncology

## 2014-10-27 ENCOUNTER — Ambulatory Visit: Payer: BC Managed Care – HMO | Admitting: Physical Therapy

## 2014-10-27 ENCOUNTER — Ambulatory Visit: Payer: BC Managed Care – PPO | Attending: General Surgery | Admitting: Physical Therapy

## 2014-10-27 ENCOUNTER — Ambulatory Visit
Admission: RE | Admit: 2014-10-27 | Discharge: 2014-10-27 | Disposition: A | Discharge status: Home / Self Care | Payer: BC Managed Care – HMO | Source: Ambulatory Visit | Attending: Radiation Oncology | Admitting: Radiation Oncology

## 2014-10-27 ENCOUNTER — Other Ambulatory Visit (HOSPITAL_BASED_OUTPATIENT_CLINIC_OR_DEPARTMENT_OTHER): Payer: BC Managed Care – HMO

## 2014-10-27 ENCOUNTER — Encounter: Payer: Self-pay | Admitting: Physical Therapy

## 2014-10-27 VITALS — BP 152/85 | HR 82 | Temp 98.2°F | Resp 18 | Ht 65.0 in | Wt 215.3 lb

## 2014-10-27 DIAGNOSIS — C50411 Malignant neoplasm of upper-outer quadrant of right female breast: Secondary | ICD-10-CM

## 2014-10-27 DIAGNOSIS — Z170 Estrogen receptor positive status [ER+]: Secondary | ICD-10-CM

## 2014-10-27 DIAGNOSIS — Z5189 Encounter for other specified aftercare: Secondary | ICD-10-CM | POA: Insufficient documentation

## 2014-10-27 LAB — CBC WITH DIFFERENTIAL/PLATELET
BASO%: 0.4 % (ref 0.0–2.0)
Basophils Absolute: 0 10*3/uL (ref 0.0–0.1)
EOS%: 1.9 % (ref 0.0–7.0)
Eosinophils Absolute: 0.1 10*3/uL (ref 0.0–0.5)
HCT: 43.9 % (ref 34.8–46.6)
HGB: 14.5 g/dL (ref 11.6–15.9)
LYMPH%: 19.8 % (ref 14.0–49.7)
MCH: 30.1 pg (ref 25.1–34.0)
MCHC: 33.1 g/dL (ref 31.5–36.0)
MCV: 91 fL (ref 79.5–101.0)
MONO#: 0.5 10*3/uL (ref 0.1–0.9)
MONO%: 6.9 % (ref 0.0–14.0)
NEUT%: 71 % (ref 38.4–76.8)
NEUTROS ABS: 5.1 10*3/uL (ref 1.5–6.5)
Platelets: 226 10*3/uL (ref 145–400)
RBC: 4.82 10*6/uL (ref 3.70–5.45)
RDW: 12.8 % (ref 11.2–14.5)
WBC: 7.2 10*3/uL (ref 3.9–10.3)
lymph#: 1.4 10*3/uL (ref 0.9–3.3)

## 2014-10-27 LAB — COMPREHENSIVE METABOLIC PANEL (CC13)
ALBUMIN: 3.7 g/dL (ref 3.5–5.0)
ALT: 22 U/L (ref 0–55)
ANION GAP: 8 meq/L (ref 3–11)
AST: 18 U/L (ref 5–34)
Alkaline Phosphatase: 77 U/L (ref 40–150)
BUN: 11.9 mg/dL (ref 7.0–26.0)
CO2: 29 meq/L (ref 22–29)
Calcium: 9.3 mg/dL (ref 8.4–10.4)
Chloride: 101 mEq/L (ref 98–109)
Creatinine: 0.9 mg/dL (ref 0.6–1.1)
Glucose: 134 mg/dl (ref 70–140)
POTASSIUM: 3.8 meq/L (ref 3.5–5.1)
SODIUM: 138 meq/L (ref 136–145)
TOTAL PROTEIN: 7 g/dL (ref 6.4–8.3)
Total Bilirubin: 0.64 mg/dL (ref 0.20–1.20)

## 2014-10-27 NOTE — Therapy (Signed)
Physical Therapy Evaluation  Patient Details  Name: Diana Deleon MRN: [id number] Date of Birth: August 17, 1961  Encounter Date: 10/27/2014      PT End of Session - 10/27/14 1819    Visit Number 1   Number of Visits 1   PT Start Time 1310   PT Stop Time 1337   PT Time Calculation (min) 27 min   Activity Tolerance Patient tolerated treatment well   Behavior During Therapy [facility] for tasks assessed/performed      Past Medical History  Diagnosis Date  . History of gastroesophageal reflux (GERD)   . History of anemia   . History of kidney stones     lithotripsy in past for L sised stone  . History of recurrent UTIs   . History of abnormal Pap smear     has had leep in past  . History of cold sores     has used valtrex in past  . Seborrheic dermatitis     local on R scalp and in ear (failed mult meds- uses steroid spray)  . Intermittent vertigo   . Fibroids     hyst. 2006  . Breast cancer   . Hypertension   . Thyroid disease     Past Surgical History  Procedure Laterality Date  . Partial hysterectomy  2006    fibroids  . Cholecystectomy  2003    There were no vitals taken for this visit.  Visit Diagnosis:  Carcinoma of upper-outer quadrant of right female breast      Subjective Assessment - 10/27/14 1808    Symptoms Patient was seen today at the breast multidisciplinary clinic for her new diagnosis of right breast cancer.     Pertinent History Patient was diagnosed with right ER/PR positive breast cancer on 10/21/14.  Proliferation rate is 30%.  She was seen today for an evaluation and baseline assessment.   Patient Stated Goals Education on lymphedema risk reduction practices and a shoulder range of motion post op home exercise program   Currently in Pain? No/denies          [facility] PT Assessment - 10/27/14 0001    Assessment   Medical Diagnosis right upper outer quadrant right breasr cancer   Onset Date 10/21/14   Precautions   Precautions Other (comment)  active  right breast cancer   Balance Screen   Has the patient fallen in the past 6 months No   Has the patient had a decrease in activity level because of a fear of falling?  No   Is the patient reluctant to leave their home because of a fear of falling?  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   Leisure Patient does not exercise   Cognition   Overall Cognitive Status Within Functional Limits for tasks assessed   Posture/Postural Control   Posture/Postural Control No significant limitations   AROM   Right Shoulder Extension 62 Degrees   Right Shoulder Flexion 149 Degrees   Right Shoulder ABduction 171 Degrees   Right Shoulder Internal Rotation 70 Degrees   Right Shoulder External Rotation 83 Degrees   Left Shoulder Extension 67 Degrees   Left Shoulder Flexion 150 Degrees   Left Shoulder ABduction 164 Degrees   Left Shoulder Internal Rotation 68 Degrees  Left Shoulder External Rotation 93 Degrees            PT Education - 10/27/14 1819    Education provided Yes   Education Details Post op shoulder range of motion home exercise program   Person(s) Educated Patient;Spouse   Methods Explanation;Demonstration;Handout   Comprehension Verbalized understanding              Plan - 10/27/14 1820    Clinical Impression Statement Patient was seen today for a baseline assessment of her right breast cancer.  She is planning to have a right lumpectomy and sentinel node biopsy followed by radiation and anti-estrogen therapy.  She may benefit from physical therapy after srugery to regain function.     Pt will benefit from skilled therapeutic intervention in order to improve on the following deficits Decreased range of motion;Increased edema;Decreased knowledge of  precautions;Impaired UE functional use   Rehab Potential Excellent   Clinical Impairments Affecting Rehab Potential none   PT Frequency One time visit   PT Treatment/Interventions Therapeutic exercise;Patient/family education   Consulted and Agree with Plan of Care Patient        Problem List Patient Active Problem List   Diagnosis Date Noted  . Breast cancer of upper-outer quadrant of right female breast 10/25/2014  . Viral URI with cough 11/25/2013  . Hyperglycemia 07/14/2013  . Routine general medical examination at a health care facility 10/12/2012  . Subclinical hypothyroidism 06/03/2012  . Elevated transaminase level 06/03/2012  . Left foot pain 05/27/2012  . Overactive bladder 05/27/2012  . Dermatitis 05/27/2012  . COLD SORE 07/07/2010  . VERTIGO 08/01/2009  . URINARY URGENCY 07/15/2009  . LOW BACK PAIN, CHRONIC 05/17/2008  . HEADACHE, CHRONIC 05/17/2008  . ESSENTIAL HYPERTENSION 02/12/2008  . DERMATITIS, SEBORRHEIC 02/12/2008            LYMPHEDEMA/ONCOLOGY QUESTIONNAIRE - 10/27/14 1817    Type   Cancer Type Right upper outer quadrant breast cancer   Lymphedema Assessments   Lymphedema Assessments Upper extremities   Right Upper Extremity Lymphedema   10 cm Proximal to Olecranon Process 32 cm   Olecranon Process 26.3 cm   10 cm Proximal to Ulnar Styloid Process 22.8 cm   Just Proximal to Ulnar Styloid Process 16.1 cm   Across Hand at [employer] 18.9 cm   At [place] of 2nd Digit 6.2 cm   Left Upper Extremity Lymphedema   10 cm Proximal to Olecranon Process 32.5 cm   Olecranon Process 26.3 cm   10 cm Proximal to Ulnar Styloid Process 23.6 cm   Just Proximal to Ulnar Styloid Process 16.8 cm   Across Hand at [employer] 18.8 cm   At [place] of 2nd Digit 6.2 cm           Breast Clinic Goals - 10/27/14 1823    Patient will be able to verbalize understanding of pertinent lymphedema risk reduction practices relevant to her diagnosis specifically  related to skin care.   Time 1   Period Days   Status Achieved   Patient will be able to return demonstrate and/or verbalize understanding of the post-op home exercise program related to regaining shoulder range of motion.   Time 1   Period Days   Status Achieved   Patient will be able to verbalize understanding of the importance of attending the postoperative After Breast Cancer Class for further lymphedema risk reduction education and therapeutic exercise.   Time 1   Period Days   Status  Achieved       Jesalyn Finazzo,MARTI COOPER, PT 10/27/2014, 6:24 PM

## 2014-10-27 NOTE — Progress Notes (Signed)
[facility] NOTE  Patient Care Team: Abner Greenspan, MD as PCP - General Rulon Eisenmenger, MD as Consulting Physician (Hematology and Oncology) Excell Seltzer, MD as Consulting Physician (General Surgery) Eppie Gibson, MD as Attending Physician (Radiation Oncology) Trinda Pascal, NP as Nurse Practitioner (Nurse Practitioner)  CHIEF COMPLAINTS/PURPOSE OF CONSULTATION:  Newly diagnosed breast cancer  HISTORY OF PRESENTING ILLNESS:  Diana Deleon 53 y.o. female is here because of recent diagnosis of right-sided breast cancer. She had a screening mammogram that revealed distortion the right breast. She underwent ultrasound which revealed a 4.2 cm area of distortion. It appears that this extent of distortion was because the ultrasound could not differentiate breast density versus a tumor. She underwent a right-sided breast biopsy that revealed DCIS with invasive ductal carcinoma there were small areas of lobular cancer as well. ER was 70% PR 80% Ki-67 30% and HER-2 was negative. MRI of the breasts was performed which revealed a 2.3 cm area of abnormality which is more accurate than the ultrasound reading. In addition there was anemia of scarring measuring 2.3 cm it was not seen separate from this primary tumor. She is scheduled to undergo biopsy on the CT of scarring on 11/02/2014. She was presented at the multidisciplinary breast tumor board and she is here today to discuss treatment options.   I reviewed her records extensively and collaborated the history with the patient.  SUMMARY OF ONCOLOGIC HISTORY:   Breast cancer of upper-outer quadrant of right female breast   10/20/2014 Initial Diagnosis Right breast invasive and in situ mammary cancer grade 2 E-cadherin positive, invasive ductal carcinoma with DCIS focal atypical cells negative for atypia and ER 70% PR 80% HER-2 negative ratio 1.26 Ki-67 30%   10/22/2014 Breast MRI Right breast upper outer quadrant 2.3 cm  biopsy-proven malignancy    In terms of breast cancer risk profile:  She menarched at early age of 59  She had 3 pregnancy, her first child was born at age 48  She has not received birth control pills.  She was never exposed to fertility medications or hormone replacement therapy.  She has family history of Breast/GYN/GI cancer  MEDICAL HISTORY:  Past Medical History  Diagnosis Date  . History of gastroesophageal reflux (GERD)   . History of anemia   . History of kidney stones     lithotripsy in past for L sised stone  . History of recurrent UTIs   . History of abnormal Pap smear     has had leep in past  . History of cold sores     has used valtrex in past  . Seborrheic dermatitis     local on R scalp and in ear (failed mult meds- uses steroid spray)  . Intermittent vertigo   . Fibroids     hyst. 2006  . Breast cancer   . Hypertension   . Thyroid disease     SURGICAL HISTORY: Past Surgical History  Procedure Laterality Date  . Partial hysterectomy  2006    fibroids  . Cholecystectomy  2003    SOCIAL HISTORY: History   Social History  . Marital Status: Married    Spouse Name: N/A    Number of Children: N/A  . Years of Education: N/A   Occupational History  . Not on file.   Social History Main Topics  . Smoking status: Never Smoker   . Smokeless tobacco: Not on file  . Alcohol Use: No  . Drug Use:  No  . Sexual Activity: Not on file   Other Topics Concern  . Not on file   Social History Narrative    FAMILY HISTORY: Family History  Problem Relation Age of Onset  . Alcohol abuse Father   . COPD Father   . Hypertension Father   . Diabetes Father   . Hypertension Mother     ALLERGIES:  is allergic to norvasc.  MEDICATIONS:  Current Outpatient Prescriptions  Medication Sig Dispense Refill  . aspirin 81 MG tablet Take 81 mg by mouth daily.    [name] Kitchen estradiol (ESTRACE) 1 MG tablet Take 1 tablet by mouth daily.    [name] Kitchen levothyroxine (SYNTHROID,  LEVOTHROID) 25 MCG tablet TAKE 1 TABLET BY MOUTH DAILY BEFORE BREAKFAST. 30 tablet 5  . losartan-hydrochlorothiazide (HYZAAR) 50-12.5 MG per tablet TAKE 1 TABLET BY MOUTH EVERY DAY. NEED APPT FOR MORE REFILLS 90 tablet 0  . omeprazole (PRILOSEC OTC) 20 MG tablet Take 20 mg by mouth daily.     No current facility-administered medications for this visit.    REVIEW OF SYSTEMS:   Constitutional: Denies fevers, chills or abnormal night sweats Eyes: Denies blurriness of vision, double vision or watery eyes Ears, nose, mouth, throat, and face: Denies mucositis or sore throat Respiratory: Denies cough, dyspnea or wheezes Cardiovascular: Denies palpitation, chest discomfort or lower extremity swelling Gastrointestinal:  Denies nausea, heartburn or change in bowel habits Skin: Denies abnormal skin rashes Lymphatics: Denies new lymphadenopathy or easy bruising Neurological:Denies numbness, tingling or new weaknesses Behavioral/Psych: Mood is stable, no new changes  Breast: Denies any palpable lumps or discharge All other systems were reviewed with the patient and are negative.  PHYSICAL EXAMINATION: ECOG PERFORMANCE STATUS: 0 - Asymptomatic  Filed Vitals:   10/27/14 1322  BP: 152/85  Pulse: 82  Temp: 98.2 F (36.8 C)  Resp: 18   Filed Weights   10/27/14 1322  Weight: 215 lb 4.8 oz (97.659 kg)    GENERAL:alert, no distress and comfortable SKIN: skin color, texture, turgor are normal, no rashes or significant lesions EYES: normal, conjunctiva are pink and non-injected, sclera clear OROPHARYNX:no exudate, no erythema and lips, buccal mucosa, and tongue normal  NECK: supple, thyroid normal size, non-tender, without nodularity LYMPH:  no palpable lymphadenopathy in the cervical, axillary or inguinal LUNGS: clear to auscultation and percussion with normal breathing effort HEART: regular rate & rhythm and no murmurs and no lower extremity edema ABDOMEN:abdomen soft, non-tender and normal  bowel sounds Musculoskeletal:no cyanosis of digits and no clubbing  PSYCH: alert & oriented x 3 with fluent speech NEURO: no focal motor/sensory deficits BREAST:area breast biopsy slightly tender. No palpable axillary or supraclavicular lymphadenopathy  LABORATORY DATA:  I have reviewed the data as listed Lab Results  Component Value Date   WBC 7.2 10/27/2014   HGB 14.5 10/27/2014   HCT 43.9 10/27/2014   MCV 91.0 10/27/2014   PLT 226 10/27/2014   Lab Results  Component Value Date   NA 138 10/27/2014   K 3.8 10/27/2014   CL 102 07/07/2013   CO2 29 10/27/2014    RADIOGRAPHIC STUDIES: I have personally reviewed the radiological reports and agreed with the findings in the report. The results are summarized as above  ASSESSMENT AND PLAN:  Breast cancer of upper-outer quadrant of right female breast Right breast invasive ductal carcinoma with DCIS 2.3 cm by MRI T2, N0, M0 clinical stage II A., EF 30%, PR 80%, HER-2 negative, Ki-67 30% with additional area of 2.3 cm anemia enhancement  biopsy to be done on 11/02/2014  Radiologist pathology counseling:Discussed with the patient, the details of pathology including the type of breast cancer,the clinical staging, the significance of ER, PR and HER-2/neu receptors and the implications for treatment. After reviewing the pathology in detail, we proceeded to discuss the different treatment options between surgery, radiation, chemotherapy, antiestrogen therapies.  Recommendation: Surgery followed by evaluation with Oncotype DX to determine if she would benefit from chemotherapy followed by radiation followed by antiestrogen therapy with tamoxifen for 10 years.  Oncotype DX counseling:I discussed Oncotype DX test. I explained to the patient that this is a 21 gene panel to evaluate patient tumors DNA to calculate recurrence score. This would help determine whether patient has high risk or intermediate risk or low risk breast cancer. She understands  that if her tumor was found to be high risk, she would benefit from systemic chemotherapy. If low risk, no need of chemotherapy. If she was found to be intermediate risk, we would need to evaluate the score as well as other risk factors and determine if an abbreviated chemotherapy may be of benefit.  Tamoxifen counseling: We discussed the risks and benefits of tamoxifen. These include but not limited to insomnia, hot flashes, mood changes, vaginal dryness, and weight gain. Although rare, serious side effects including endometrial cancer, risk of blood clots were also discussed. Beneficial effect of tamoxifen on prevention of osteoporosis was also discussed. Planned treatment duration is 10 years.  Return to clinic after surgery to discuss treatment options and to decide if Oncotype DX needs to be sent to    All questions were answered. The patient knows to call the clinic with any problems, questions or concerns. I spent 40 minutes counseling the patient face to face. The total time spent in the appointment was 60 minutes and more than 50% was on counseling.     Rulon Eisenmenger, MD 10/27/2014 4:26 PM

## 2014-10-27 NOTE — Progress Notes (Signed)
Ms. Hodsdon is a very pleasant 53 y.o.[name] Kitchen female from [place], [place] with newly diagnosed invasive mammary carcinoma and DCIS of the right breast.  Biopsy results also revealed pathology indicating the tumor is ER positive, PR positive, and HER2/neu negative.  She presents today with her family to the [facility]) for treatment consideration and recommendations from the breast surgeon, radiation oncologist, and medical oncologist.     I briefly met with Ms. Lengyel and her family  during her [facility] visit today. We discussed the purpose of the Survivorship Clinic, which will include monitoring for recurrence, coordinating completion of age and gender-appropriate cancer screenings, promotion of overall wellness, as well as managing potential late/long-term side effects of anti-cancer treatments.    As of today, the treatment plan for Ms. Whitebread will likely include surgery, radiation therapy, and/or chemotherapy pending her Oncotype Dx score and final pathology at the time of surgery.   Endocrine therapy with an aromatase inhibitor will also be considered as well.The intent of treatment for Ms. Reta is cure, therefore she is will be eligible for the Survivorship Clinic upon her completion of treatment.  Her survivorship care plan (SCP) document has been drafted and will be updated throughout the course of her treatment trajectory.  She will receive the SCP in an office visit with myself in the Survivorship Clinic once she has completed treatment.   Ms. Cathy was encouraged to ask questions and all questions were answered to her satisfaction.  She was given my business card and encouraged to contact me with any concerns regarding survivorship.  I look forward to  participating in her care.

## 2014-10-27 NOTE — Assessment & Plan Note (Signed)
Right breast invasive ductal carcinoma with DCIS 2.3 cm by MRI T2, N0, M0 clinical stage II A., EF 30%, PR 80%, HER-2 negative, Ki-67 30% with additional area of 2.3 cm anemia enhancement biopsy to be done on 11/02/2014  Radiologist pathology counseling:Discussed with the patient, the details of pathology including the type of breast cancer,the clinical staging, the significance of ER, PR and HER-2/neu receptors and the implications for treatment. After reviewing the pathology in detail, we proceeded to discuss the different treatment options between surgery, radiation, chemotherapy, antiestrogen therapies.  Recommendation: Surgery followed by evaluation with Oncotype DX to determine if she would benefit from chemotherapy followed by radiation followed by antiestrogen therapy with tamoxifen for 10 years.  Oncotype DX counseling:I discussed Oncotype DX test. I explained to the patient that this is a 21 gene panel to evaluate patient tumors DNA to calculate recurrence score. This would help determine whether patient has high risk or intermediate risk or low risk breast cancer. She understands that if her tumor was found to be high risk, she would benefit from systemic chemotherapy. If low risk, no need of chemotherapy. If she was found to be intermediate risk, we would need to evaluate the score as well as other risk factors and determine if an abbreviated chemotherapy may be of benefit.  Tamoxifen counseling: We discussed the risks and benefits of tamoxifen. These include but not limited to insomnia, hot flashes, mood changes, vaginal dryness, and weight gain. Although rare, serious side effects including endometrial cancer, risk of blood clots were also discussed. Beneficial effect of tamoxifen on prevention of osteoporosis was also discussed. Planned treatment duration is 10 years.  Return to clinic after surgery to discuss treatment options and to decide if Oncotype DX needs to be sent to

## 2014-10-27 NOTE — Patient Instructions (Signed)
Patient was instructed today in a home exercise program today for post op shoulder range of motion. These included active assist shoulder flexion in sitting, scapular retraction, wall walking with shoulder abduction, and hands behind head external rotation.  She was encouraged to do these twice a day, holding 3 seconds and repeating 5 times when permitted by her physician.  Physical Therapy Information for After Breast Cancer Surgery/Treatment:   Lymphedema is a swelling condition that you may be at risk for in your arm if you have lymph nodes removed from the armpit area.  After a sentinel node biopsy, the risk is approximately 5-9% and is higher after an axillary node dissection.  There is treatment available for this condition and it is not life-threatening.  Contact your physician or physical therapist with concerns.  You may begin the 4 shoulder/posture exercises (see additional sheet) when permitted by your physician (typically a week after surgery).  If you have drains, you may need to wait until those are removed before beginning range of motion exercises.  A general recommendation is to not lift your arms above shoulder height until drains are removed.  These exercises should be done to your tolerance and gently.  This is not a "no pain/no gain" type of recovery so listen to your body and stretch into the range of motion that you can tolerate, stopping if you have pain.  If you are having immediate reconstruction, ask your plastic surgeon about doing exercises as he or she may want you to wait.  We encourage you to attend the free one time ABC (After Breast Cancer) class offered by [facility].  You will learn information related to lymphedema risk, prevention and treatment and additional exercises to regain mobility following surgery.  You can call [phone] for more information.  This is offered the 1st and 3rd Monday of each month.  You only attend the class one  time.  While undergoing any medical procedure or treatment, try to avoid blood pressure being taken or needle sticks from occurring on the arm on the side of cancer.   This recommendation begins after surgery and continues for the rest of your life.  This may help reduce your risk of getting lymphedema (swelling in your arm).  An excellent resource for those seeking information on lymphedema is the National Lymphedema Network's web site. It can be accessed at [place].org  If you notice swelling in your hand, arm or breast at any time following surgery (even if it is many years from now), please contact your doctor or physical therapist to discuss this.  Lymphedema can be treated at any time but it is easier for you if it is treated early on.  If you feel like your shoulder motion is not returning to normal in a reasonable amount of time, please contact your surgeon or physical therapist.  [facility]. [place], [place], [place] [phone]; [address] [place], [place] [address] ABC CLASS After Breast Cancer Class  After Breast Cancer Class is a specially designed exercise class to assist you in a safe recover after having breast cancer surgery.  In this class you will learn how to get back to full function whether your drains were just removed or if you had surgery a month ago.  This one-time class is held the 1st and 3rd Monday of every month from 11:00 a.m. until 12:00 noon at the [facility] located at [address] [place], [place] [address]  This class is FREE and space is limited. For more information or to register for the next available class, call [phone].  Class Goals   Understand specific stretches to improve the flexibility of you chest and shoulder.  Learn ways to safely strengthen your upper body and improve your posture.  Understand the warning signs of infection and why you may be at risk for an arm infection.  Learn about Lymphedema and  prevention.  ** You do not attend this class until after surgery.  Drains mus be removed to participate

## 2014-10-27 NOTE — Progress Notes (Signed)
Checked in new pt with no financial concerns at this time. I informed pt that Raquel will call her ins for auth if chemo is part of her treatment as well as get in touch with foundations that offer copay assistance for chemo if needed. She has Raquel's card for any questions or concerns she may have in the future.

## 2014-10-27 NOTE — Progress Notes (Signed)
Radiation Oncology         [phone]) [phone] ________________________________  Initial outpatient Consultation  Name: Diana Deleon MRN: [id number]  Date: 10/27/2014  DOB: [birth year]  CC:Loura Pardon, MD  Excell Seltzer, MD   REFERRING PHYSICIAN: Excell Seltzer, MD  DIAGNOSIS:    ICD-9-CM ICD-10-CM   1. Breast cancer of upper-outer quadrant of right female breast 174.4 C50.411      Stage II T2N0M0 Breast UOQ Invasive Ductal Carcinoma, ER90% / PR90% / Her2neg, Ki67 10%   HISTORY OF PRESENT ILLNESS::Diana Deleon is a 53 y.o. female who presented with right breast distortion on mammogram.  On [place], this was 4.2cm at 10:00.  Biopsy showed invasive and in situ mammary carcinoma.  ER 70%, PR80%, HER2 Negative.  Ki67 30%.  MRI showed a 2.3 cm enhancing mass (which is felt to be a more accurate measurement than her [place].)  Also, linear enhancement of 2.3cm in right breast noted separately - bx pending on 11/17 by MRI guidance.  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 She is on Estradiol for hot flashes and I told her to stop this as of today.  PREVIOUS RADIATION THERAPY: No  PAST MEDICAL HISTORY:  has a past medical history of History of gastroesophageal reflux (GERD); History of anemia; History of kidney stones; History of recurrent UTIs; History of abnormal Pap smear; History of cold sores; Seborrheic dermatitis; Intermittent vertigo; Fibroids; Breast cancer; Hypertension; and Thyroid disease.    PAST SURGICAL HISTORY: Past Surgical History  Procedure Laterality Date  . Partial hysterectomy  2006    fibroids  . Cholecystectomy  2003    FAMILY HISTORY: family history includes Alcohol abuse in her father; COPD in her father; Diabetes in her father; Hypertension in her father and mother.  SOCIAL HISTORY:  reports that she has never smoked. She does not have any smokeless tobacco history on file. She reports that she does not drink alcohol or use illicit drugs.  ALLERGIES:  Norvasc  MEDICATIONS:  Current Outpatient Prescriptions  Medication Sig Dispense Refill  . aspirin 81 MG tablet Take 81 mg by mouth daily.    [name] Kitchen estradiol (ESTRACE) 1 MG tablet Take 1 tablet by mouth daily.    [name] Kitchen levothyroxine (SYNTHROID, LEVOTHROID) 25 MCG tablet TAKE 1 TABLET BY MOUTH DAILY BEFORE BREAKFAST. 30 tablet 5  . losartan-hydrochlorothiazide (HYZAAR) 50-12.5 MG per tablet TAKE 1 TABLET BY MOUTH EVERY DAY. NEED APPT FOR MORE REFILLS 90 tablet 0  . omeprazole (PRILOSEC OTC) 20 MG tablet Take 20 mg by mouth daily.     No current facility-administered medications for this encounter.    REVIEW OF SYSTEMS:  Notable for that above.   PHYSICAL EXAM:   Vitals with Age-Percentiles 10/27/2014  Length 258.5 cm  Systolic 277  Diastolic 85  Pulse 82  Respiration 18  Weight 97.659 kg  BMI 35.9  VISIT REPORT    General: Alert and oriented, in no acute distress HEENT: Head is normocephalic. Extraocular movements are intact. Oropharynx is clear. Neck: Neck is supple, no palpable cervical or supraclavicular lymphadenopathy. Heart: Regular in rate and rhythm with no murmurs, rubs, or gallops. Chest: Clear to auscultation bilaterally, with no rhonchi, wheezes, or rales. Abdomen: Soft, nontender, nondistended, with no rigidity or guarding. Extremities: No cyanosis or edema. Lymphatics: see HEENT Skin: No concerning lesions. Musculoskeletal: symmetric strength and muscle tone throughout. Neurologic: Cranial nerves II through XII are grossly intact. No obvious focalities. Speech is fluent. Coordination is intact. Psychiatric: Judgment and insight  are intact. Affect is appropriate. BREASTS: No lesions palpated in left breast or axillary regions bilaterally. Right breast: UOQ area of post biopsy firmness, about 3cm   ECOG = 0  0 - Asymptomatic (Fully active, able to carry on all predisease activities without restriction)  1 - Symptomatic but completely ambulatory (Restricted in  physically strenuous activity but ambulatory and able to carry out work of a light or sedentary nature. For example, light housework, office work)  2 - Symptomatic, <50% in bed during the day (Ambulatory and capable of all self care but unable to carry out any work activities. Up and about more than 50% of waking hours)  3 - Symptomatic, >50% in bed, but not bedbound (Capable of only limited self-care, confined to bed or chair 50% or more of waking hours)  4 - Bedbound (Completely disabled. Cannot carry on any self-care. Totally confined to bed or chair)  5 - Death   Eustace Pen MM, Creech RH, Tormey DC, et al. [phone]). "Toxicity and response criteria of the [facility] Group". [place] Oncol. 5 (6): 649-55   LABORATORY DATA:  Lab Results  Component Value Date   WBC 7.2 10/27/2014   HGB 14.5 10/27/2014   HCT 43.9 10/27/2014   MCV 91.0 10/27/2014   PLT 226 10/27/2014   CMP     Component Value Date/Time   NA 138 10/27/2014 1236   NA 137 07/07/2013 0859   K 3.8 10/27/2014 1236   K 3.6 07/07/2013 0859   CL 102 07/07/2013 0859   CO2 29 10/27/2014 1236   CO2 31 07/07/2013 0859   GLUCOSE 134 10/27/2014 1236   GLUCOSE 143* 07/07/2013 0859   BUN 11.9 10/27/2014 1236   BUN 14 07/07/2013 0859   CREATININE 0.9 10/27/2014 1236   CREATININE 0.9 07/07/2013 0859   CALCIUM 9.3 10/27/2014 1236   CALCIUM 8.9 07/07/2013 0859   PROT 7.0 10/27/2014 1236   PROT 7.2 07/07/2013 0859   ALBUMIN 3.7 10/27/2014 1236   ALBUMIN 3.6 07/07/2013 0859   AST 18 10/27/2014 1236   AST 18 07/07/2013 0859   ALT 22 10/27/2014 1236   ALT 19 07/07/2013 0859   ALKPHOS 77 10/27/2014 1236   ALKPHOS 78 07/07/2013 0859   BILITOT 0.64 10/27/2014 1236   BILITOT 0.6 07/07/2013 0859   GFRNONAA 75.99 07/07/2010 0835   GFRAA 77 05/17/2008 0834         RADIOGRAPHY: Mr Breast Bilateral W Wo Contrast  10/22/2014   CLINICAL DATA:  52 year old female with newly diagnosed invasive right breast cancer.   LABS:  BUN and creatinine were obtained on site at [facility] at  [address]  Results:  BUN 12 mg/dL,  Creatinine 1.0 mg/dL.  EXAM: BILATERAL BREAST MRI WITH AND WITHOUT CONTRAST  TECHNIQUE: Multiplanar, multisequence MR images of both breasts were obtained prior to and following the intravenous administration of 32m of MultiHance.  THREE-DIMENSIONAL MR IMAGE RENDERING ON INDEPENDENT WORKSTATION:  Three-dimensional MR images were rendered by post-processing of the original MR data on an independent workstation. The three-dimensional MR images were interpreted, and findings are reported in the following complete MRI report for this study. Three dimensional images were evaluated at the independent DynaCad workstation  COMPARISON:  Previous exams  FINDINGS: Breast composition: d.  Extreme fibroglandular tissue.  Background parenchymal enhancement: Moderate  Right breast: A 2.2 x 2.3 x 2.3 cm irregular enhancing mass with plateau kinetics is identified within the upper outer right breast (middle third) compatible  with biopsy proven carcinoma. Biopsy clip artifact lies along the very anterior superior aspect of the neoplasm.  A 2.3 cm linear area of stippled nodular enhancement within the upper inner right breast (middle -posterior third junction) exhibits plateau type kinetics and may represent DCIS.  No other suspicious areas of enhancement within the right breast noted.  Left breast: No mass or abnormal enhancement.  Lymph nodes: No abnormal appearing lymph nodes.  Ancillary findings:  None.  IMPRESSION: 2.3 cm biopsy-proven malignancy within the upper-outer right breast with biopsy clip artifact along the very anterior superior aspect of this mass. Please note that the sonographic measurements of this mass are an over estimation due to adjacent very dense fibroglandular tissue.  2.3 cm linear stippled nodular enhancement within the upper inner right breast which may represent DCIS. Recommend MR  guided tissue sampling if breast conservation will be pursued.  No evidence of left breast malignancy or abnormal appearing lymph nodes.  RECOMMENDATION: Recommend MR guided right breast biopsy of linear stippled nodular enhancement within the upper inner right breast if breast conservation will be pursued.  Otherwise treatment plan.  BI-RADS CATEGORY  4: Suspicious.   Electronically Signed   By: Hassan Rowan M.D.   On: 10/22/2014 14:09   Mm Digital Diagnostic Unilat R  10/20/2014   CLINICAL DATA:  Status post right breast ultrasound-guided core needle biopsy.  EXAM: DIAGNOSTIC RIGHT MAMMOGRAM POST ULTRASOUND BIOPSY  COMPARISON:  Previous exams  FINDINGS: Mammographic images were obtained following ultrasound guided biopsy of a 4.2 cm area of dense acoustical shadowing in the 10 o'clock position of the right breast. These demonstrate a heart shaped biopsy marker clip at the anterior, superior aspect of the recently demonstrated area of acoustical shadowing in the upper outer right breast.  IMPRESSION: Clip deployment at the anterior, superior aspect of the area of architectural distortion in the upper outer right breast.  Final Assessment: Post Procedure Mammograms for Marker Placement   Electronically Signed   By: Enrique Sack M.D.   On: 10/20/2014 14:47   Mm Digital Diagnostic Unilat R  10/13/2014   CLINICAL DATA:  The patient returns after screening study for evaluation of possible distortion in the right breast.  EXAM: DIGITAL DIAGNOSTIC  RIGHT MAMMOGRAM  ULTRASOUND RIGHT BREAST  COMPARISON:  09/29/2014  ACR Breast Density Category d: The breast tissue is extremely dense, which lowers the sensitivity of mammography.  FINDINGS: Additional views are performed showing persistent distortion in the upper-outer quadrant of the right breast. Within the area of distortion there are punctate faint calcifications.  On physical exam, I palpate soft thickening in the 10 o'clock location of the right breast.  Ultrasound is  performed, showing a large area of acoustic shadowing in the 10 o'clock location of the right breast 7 cm from the nipple, difficult to measure but estimated to be at least 4.2 x 3.3 cm. Evaluation of the right axilla is negative for adenopathy.  IMPRESSION: Suspicious area of distortion and acoustic shadowing in the 10 o'clock location of the right breast. Tissue diagnosis is recommended.  RECOMMENDATION: Ultrasound-guided core biopsy is recommended and scheduled for the patient on 10/20/2014 at 1 o'clock p.m. The patient takes 1 baby aspirin daily as a preventive measure. She will discontinue aspirin up to 5 days prior to the biopsy.  I have discussed the findings and recommendations with the patient. Results were also provided in writing at the conclusion of the visit. If applicable, a reminder letter will be sent to the  patient regarding the next appointment.  BI-RADS CATEGORY  5: Highly suggestive of malignancy.   Electronically Signed   By: Shon Hale M.D.   On: 10/13/2014 11:18   US Breast Ltd Uni Right Inc Axilla  10/19/2014   CLINICAL DATA:  The patient returns after screening study for evaluation of possible distortion in the right breast.  EXAM: DIGITAL DIAGNOSTIC  RIGHT MAMMOGRAM  ULTRASOUND RIGHT BREAST  COMPARISON:  09/29/2014  ACR Breast Density Category d: The breast tissue is extremely dense, which lowers the sensitivity of mammography.  FINDINGS: Additional views are performed showing persistent distortion in the upper-outer quadrant of the right breast. Within the area of distortion there are punctate faint calcifications.  On physical exam, I palpate soft thickening in the 10 o'clock location of the right breast.  Ultrasound is performed, showing a large area of acoustic shadowing in the 10 o'clock location of the right breast 7 cm from the nipple, difficult to measure but estimated to be at least 4.2 x 3.3 cm. Evaluation of the right axilla is negative for adenopathy.  IMPRESSION: Suspicious  area of distortion and acoustic shadowing in the 10 o'clock location of the right breast. Tissue diagnosis is recommended.  RECOMMENDATION: Ultrasound-guided core biopsy is recommended and scheduled for the patient on 10/20/2014 at 1 o'clock p.m. The patient takes 1 baby aspirin daily as a preventive measure. She will discontinue aspirin up to 5 days prior to the biopsy.  I have discussed the findings and recommendations with the patient. Results were also provided in writing at the conclusion of the visit. If applicable, a reminder letter will be sent to the patient regarding the next appointment.  BI-RADS CATEGORY  5: Highly suggestive of malignancy.   Electronically Signed   By: Shon Hale M.D.   On: 10/13/2014 11:18   [place] Rt Breast Bx W Loc Dev 1st Lesion Img Bx Spec US Guide  10/21/2014   ADDENDUM REPORT: 10/21/2014 10:18  ADDENDUM: Pathology revealed grade II invasive mammary carcinoma and mammary carcinoma in situ in the right breast. This was found to be concordant by Dr. Shon Hale. Pathology was discussed with the patient by telephone. She reported doing well after the biopsy. Post biopsy instructions were reviewed and her questions were answered. A bilateral breast MRI has been scheduled for October 22, 2014. She is [facility] at [facility] on October 27, 2014. She is encouraged to come to [facility] for educational materials. My number is provided for future questions and concerns.  Pathology results reported by Susa Raring RN, BSN on October 21, 2014.   Electronically Signed   By: Enrique Sack M.D.   On: 10/21/2014 10:18   10/21/2014   CLINICAL DATA:  4.2 cm area of dense acoustical shadowing and architectural distortion in the 10 o'clock position of the right breast at recent ultrasound and mammography.  EXAM: ULTRASOUND GUIDED RIGHT BREAST CORE NEEDLE BIOPSY  COMPARISON:  Previous exams.  FINDINGS: I met with the patient and we  discussed the procedure of ultrasound-guided biopsy, including benefits and alternatives. We discussed the high likelihood of a successful procedure. We discussed the risks of the procedure, including infection, bleeding, tissue injury, clip migration, and inadequate sampling. Informed written consent was given. The usual time-out protocol was performed immediately prior to the procedure.  Using sterile technique and 2% Lidocaine as local anesthetic, under direct ultrasound visualization, a 12 gauge spring-loaded device was used to perform biopsy of  the recently demonstrated 4.2 cm area of the dense acoustical shadowing in the 10 o'clock position of the right breast, 7 cm from the nipple, using an inferior approach. At the conclusion of the procedure a heart shaped tissue marker clip was deployed into the biopsy cavity. Follow up 2 view mammogram was performed and dictated separately.  IMPRESSION: Ultrasound guided biopsy of a 4.2 cm area of dense acoustical shadowing in the 10 o'clock position of the right breast. No apparent complications.  Electronically Signed: By: Enrique Sack M.D. On: 10/20/2014 14:45      IMPRESSION/PLAN:Today, I talked to the patient about the findings and work-up thus far. We discussed the patient's diagnosis of right breast cancer and general treatment for this, highlighting the role of radiotherapy in the management. We discussed the available radiation techniques, and focused on the details of logistics and delivery.    She has been discussed at our multidisciplinary tumor board.  The consensus is that she is likely a good candidate for breast conservation. The area of linear enhancement will need to be biopsied in her breast and she may need two lumpectomies is this is positive, versus mastectomy, if cosmesis would be unacceptable.  I talked to her about the option of a mastectomy and informed her that her expected overall survival would be equivalent between mastectomy and  breast conservation, based upon randomized controlled data. She is enthusiastic about breast conservation.  Adjuvant radiotherapy should decrease her risk of in-breast recurrences by 2/3.  Chemotherapy plans will pend the final path. If she received chemotherapy, this will follow surgery and precede radiotherapy.  It was a pleasure meeting the patient today. We spoke about acute effects including skin irritation and fatigue as well as much less common late effects including lung irritation. We spoke about the latest technology that is used to minimize the risk of late effects for breast cancer patients undergoing radiotherapy. No guarantees of treatment were given. The patient is enthusiastic about proceeding with treatment. I look forward to participating in the patient's care.  I instructed her to stop Estradiol. She will discuss her hot flashes with Dr. Lindi Adie and our nurse practitioner who specializes in survivorship.  __________________________________________   Eppie Gibson, MD

## 2014-10-28 ENCOUNTER — Encounter: Payer: Self-pay | Admitting: General Practice

## 2014-10-28 NOTE — Progress Notes (Signed)
[facility] Psychosocial Distress Screening Spiritual Care   with Tanga, husband, and daughter Jinny Blossom (spelling?) in breast clinic to introduce [facility] team/resources, and to reveiw  distress screen per protocol.  The patient scored a 6 on the Psychosocial Distress Thermometer which indicates moderate distress.  Also assessed for distress and other psychosocial needs.   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  Per pt, her top concerns are financial, related to work and insurance, and how tx will affect family's financial situation and long-term planning.  Provided pastoral presence, reflective listening, affirmation, and encouragement.  Follow up needed: No.  Family aware of ongoing chaplain/support team availability and contact info, but please also page as needs arise:  [phone].  Thank you.  [place], [place]

## 2014-10-28 NOTE — Progress Notes (Signed)
Note created by Dr. Gudena during office visit. Copy to patient, original to scan. 

## 2014-11-02 ENCOUNTER — Ambulatory Visit
Admission: RE | Admit: 2014-11-02 | Discharge: 2014-11-02 | Disposition: A | Discharge status: Home / Self Care | Payer: BC Managed Care – PPO | Source: Ambulatory Visit | Attending: Obstetrics and Gynecology | Admitting: Obstetrics and Gynecology

## 2014-11-02 ENCOUNTER — Encounter: Payer: Self-pay | Admitting: Family Medicine

## 2014-11-02 ENCOUNTER — Ambulatory Visit (INDEPENDENT_AMBULATORY_CARE_PROVIDER_SITE_OTHER): Payer: BC Managed Care – PPO | Admitting: Family Medicine

## 2014-11-02 VITALS — BP 128/86 | HR 83 | Temp 98.3°F | Ht 65.0 in | Wt 213.8 lb

## 2014-11-02 DIAGNOSIS — E039 Hypothyroidism, unspecified: Secondary | ICD-10-CM

## 2014-11-02 DIAGNOSIS — R928 Other abnormal and inconclusive findings on diagnostic imaging of breast: Secondary | ICD-10-CM

## 2014-11-02 DIAGNOSIS — E038 Other specified hypothyroidism: Secondary | ICD-10-CM

## 2014-11-02 DIAGNOSIS — I10 Essential (primary) hypertension: Secondary | ICD-10-CM

## 2014-11-02 DIAGNOSIS — R739 Hyperglycemia, unspecified: Secondary | ICD-10-CM

## 2014-11-02 LAB — LIPID PANEL
CHOL/HDL RATIO: 3
CHOLESTEROL: 188 mg/dL (ref 0–200)
HDL: 68.7 mg/dL (ref >39.00)
LDL Cholesterol: 99 mg/dL (ref 0–99)
NonHDL: 119.3
TRIGLYCERIDES: 101 mg/dL (ref 0.0–149.0)
VLDL: 20.2 mg/dL (ref 0.0–40.0)

## 2014-11-02 LAB — HEMOGLOBIN A1C: Hgb A1c MFr Bld: 5.3 % (ref 4.6–6.5)

## 2014-11-02 LAB — TSH: TSH: 3.05 u[IU]/mL (ref 0.35–4.50)

## 2014-11-02 MED ORDER — LEVOTHYROXINE SODIUM 25 MCG PO TABS: ORAL_TABLET | ORAL | Status: DC

## 2014-11-02 MED ORDER — GADOBENATE DIMEGLUMINE 529 MG/ML IV SOLN
19.0000 mL | Freq: Once | INTRAVENOUS | Status: AC | PRN
  Administered 2014-11-02: 19 mL via INTRAVENOUS

## 2014-11-02 MED ORDER — LOSARTAN POTASSIUM-HCTZ 50-12.5 MG PO TABS: ORAL_TABLET | ORAL | Status: DC

## 2014-11-02 NOTE — Assessment & Plan Note (Signed)
On low dose levothyroxine No clinical changes tsh today Med refilled -printed

## 2014-11-02 NOTE — Progress Notes (Signed)
Subjective:    Patient ID: Diana Deleon, female    DOB: 1961-08-30, 53 y.o.   MRN: [id number]  HPI Here for f/u of chronic medical problems  Had 2nd biopsy for breast cancer upcoming  Waiting for results today  ? Lumpectomy or mastectomy  Nobody in the family has cancer at all   There is financial stress from that    HTN and hypothyroidism  bp is stable today  No cp or palpitations or headaches or edema  No side effects to medicines  BP Readings from Last 3 Encounters:  11/02/14 128/86  10/27/14 152/85  01/04/14 128/84    Just had labs for oncol   Chemistry      Component Value Date/Time   NA 138 10/27/2014 1236   NA 137 07/07/2013 0859   K 3.8 10/27/2014 1236   K 3.6 07/07/2013 0859   CL 102 07/07/2013 0859   CO2 29 10/27/2014 1236   CO2 31 07/07/2013 0859   BUN 11.9 10/27/2014 1236   BUN 14 07/07/2013 0859   CREATININE 0.9 10/27/2014 1236   CREATININE 0.9 07/07/2013 0859      Component Value Date/Time   CALCIUM 9.3 10/27/2014 1236   CALCIUM 8.9 07/07/2013 0859   ALKPHOS 77 10/27/2014 1236   ALKPHOS 78 07/07/2013 0859   AST 18 10/27/2014 1236   AST 18 07/07/2013 0859   ALT 22 10/27/2014 1236   ALT 19 07/07/2013 0859   BILITOT 0.64 10/27/2014 1236   BILITOT 0.6 07/07/2013 0859      Glucose was 134 No symptoms of DM Will check A1C today  Is obese  Does watch sugar in diet some of the time  No finger/toe numbness   Hypothyroidism  Pt has no clinical changes No change in energy level/ hair or skin/ edema and no tremor Lab Results  Component Value Date   TSH 2.27 11/25/2013    Due for check   She had to get off the estradiol- doing ok so far  Some hot flashes-expects that to get worse   Wt is down 2 lb with bmi of 35   Had her flu shot   Diet is so/so -picky eater    Patient Active Problem List   Diagnosis Date Noted  . Breast cancer of upper-outer quadrant of right female breast 10/25/2014  . Viral URI with cough 11/25/2013  .  Hyperglycemia 07/14/2013  . Routine general medical examination at a health care facility 10/12/2012  . Subclinical hypothyroidism 06/03/2012  . Elevated transaminase level 06/03/2012  . Left foot pain 05/27/2012  . Overactive bladder 05/27/2012  . Dermatitis 05/27/2012  . COLD SORE 07/07/2010  . VERTIGO 08/01/2009  . URINARY URGENCY 07/15/2009  . LOW BACK PAIN, CHRONIC 05/17/2008  . HEADACHE, CHRONIC 05/17/2008  . Essential hypertension 02/12/2008  . DERMATITIS, SEBORRHEIC 02/12/2008   Past Medical History  Diagnosis Date  . History of gastroesophageal reflux (GERD)   . History of anemia   . History of kidney stones     lithotripsy in past for L sised stone  . History of recurrent UTIs   . History of abnormal Pap smear     has had leep in past  . History of cold sores     has used valtrex in past  . Seborrheic dermatitis     local on R scalp and in ear (failed mult meds- uses steroid spray)  . Intermittent vertigo   . Fibroids     hyst. 2006  .  Breast cancer   . Hypertension   . Thyroid disease    Past Surgical History  Procedure Laterality Date  . Partial hysterectomy  2006    fibroids  . Cholecystectomy  2003   History  Substance Use Topics  . Smoking status: Never Smoker   . Smokeless tobacco: Not on file  . Alcohol Use: No   Family History  Problem Relation Age of Onset  . Alcohol abuse Father   . COPD Father   . Hypertension Father   . Diabetes Father   . Hypertension Mother    Allergies  Allergen Reactions  . Norvasc [Amlodipine Besylate] Swelling   Current Outpatient Prescriptions on File Prior to Visit  Medication Sig Dispense Refill  . levothyroxine (SYNTHROID, LEVOTHROID) 25 MCG tablet TAKE 1 TABLET BY MOUTH DAILY BEFORE BREAKFAST. 30 tablet 5  . losartan-hydrochlorothiazide (HYZAAR) 50-12.5 MG per tablet TAKE 1 TABLET BY MOUTH EVERY DAY. NEED APPT FOR MORE REFILLS 90 tablet 0  . omeprazole (PRILOSEC OTC) 20 MG tablet Take 20 mg by mouth  daily.     No current facility-administered medications on file prior to visit.    Review of Systems Review of Systems  Constitutional: Negative for fever, appetite change, and unexpected weight change.  Eyes: Negative for pain and visual disturbance.  Respiratory: Negative for cough and shortness of breath.   Cardiovascular: Negative for cp or palpitations    Gastrointestinal: Negative for nausea, diarrhea and constipation.  Genitourinary: Negative for urgency and frequency.  Skin: Negative for pallor or rash   Neurological: Negative for weakness, light-headedness, numbness and headaches.  Hematological: Negative for adenopathy. Does not bruise/bleed easily.  Psychiatric/Behavioral: Negative for dysphoric mood. The patient is not nervous/anxious. Pos for stressors (new dx of breast ca)- but overall thinks she is doing ok emotionally         Objective:   Physical Exam  Constitutional: She appears well-developed and well-nourished. No distress.  obese and well appearing   HENT:  Head: Normocephalic and atraumatic.  Mouth/Throat: Oropharynx is clear and moist.  Eyes: Conjunctivae and EOM are normal. Pupils are equal, round, and reactive to light. No scleral icterus.  Neck: Normal range of motion. Neck supple. No JVD present. Carotid bruit is not present. No thyromegaly present.  Cardiovascular: Normal rate, regular rhythm, normal heart sounds and intact distal pulses.  Exam reveals no gallop.   No murmur heard. Pulmonary/Chest: Effort normal and breath sounds normal. No respiratory distress. She has no wheezes. She has no rales.  Abdominal: Soft. Bowel sounds are normal. She exhibits no distension, no abdominal bruit and no mass. There is no tenderness.  Musculoskeletal: She exhibits no edema.  Lymphadenopathy:    She has no cervical adenopathy.  Neurological: She is alert. She has normal reflexes. She displays no atrophy and no tremor. No cranial nerve deficit. She exhibits normal  muscle tone. Coordination normal.  Skin: Skin is warm and dry. No rash noted. No pallor.  Psychiatric: She has a normal mood and affect.          Assessment & Plan:   Problem List Items Addressed This Visit      Cardiovascular and Mediastinum   Essential hypertension - Primary    bp in fair control at this time  BP Readings from Last 1 Encounters:  11/02/14 128/86   No changes needed Disc lifstyle change with low sodium diet and exercise  Rev chem labs from recent hosp draw  Lipid check today  Relevant Medications      losartan-hydrochlorothiazide (HYZAAR) 50-12.5 MG per tablet   Other Relevant Orders      Lipid panel (Completed)     Endocrine   Subclinical hypothyroidism    On low dose levothyroxine No clinical changes tsh today Med refilled -printed     Relevant Medications      levothyroxine (SYNTHROID, LEVOTHROID) tablet   Other Relevant Orders      TSH (Completed)     Other   Hyperglycemia    A1C today  Glucose was in the 130s non fasting at the hospital    Relevant Orders      Hemoglobin A1c (Completed)

## 2014-11-02 NOTE — Assessment & Plan Note (Signed)
A1C today  Glucose was in the 130s non fasting at the hospital

## 2014-11-02 NOTE — Assessment & Plan Note (Addendum)
bp in fair control at this time  BP Readings from Last 1 Encounters:  11/02/14 128/86   No changes needed Disc lifstyle change with low sodium diet and exercise  Rev chem labs from recent hosp draw  Lipid check today

## 2014-11-02 NOTE — Patient Instructions (Signed)
Lab today for thyroid and cholesterol  Eat healthy Take care of yourself  Looking forward to some more info re: the breast cancer  Here are printed px

## 2014-11-02 NOTE — Progress Notes (Signed)
Pre visit review using our clinic review tool, if applicable. No additional management support is needed unless otherwise documented below in the visit note. 

## 2014-11-03 ENCOUNTER — Encounter: Payer: Self-pay | Admitting: *Deleted

## 2014-11-04 ENCOUNTER — Telehealth: Payer: Self-pay | Admitting: *Deleted

## 2014-11-04 NOTE — Telephone Encounter (Signed)
Spoke with patient from [facility] 10/27/14.  She is doing well.  Her second biopsy is positive for DCIS with necrosis.  Patient saw Dr. Excell Seltzer today and she will now require a mastectomy.  She will be seeing Dr. Harlow Mares today as well to discuss reconstruction.  We will await her surgery date.

## 2014-11-15 ENCOUNTER — Other Ambulatory Visit (INDEPENDENT_AMBULATORY_CARE_PROVIDER_SITE_OTHER): Payer: Self-pay | Admitting: General Surgery

## 2014-11-15 DIAGNOSIS — C50911 Malignant neoplasm of unspecified site of right female breast: Secondary | ICD-10-CM

## 2014-11-17 ENCOUNTER — Telehealth: Payer: Self-pay | Admitting: Hematology and Oncology

## 2014-11-19 ENCOUNTER — Other Ambulatory Visit: Payer: Self-pay | Admitting: Family Medicine

## 2014-11-19 MED ORDER — LEVOTHYROXINE SODIUM 25 MCG PO TABS: ORAL_TABLET | ORAL | Status: DC

## 2014-11-19 NOTE — Telephone Encounter (Signed)
Pt left v/m; the rx for levothyroxine and losartan HCTZ that pt was given at last appt were lost and pt request refills sent to [facility].Please advise.

## 2014-11-19 NOTE — Telephone Encounter (Signed)
Please refill 90 d with 3 refills Thanks

## 2014-11-22 ENCOUNTER — Other Ambulatory Visit: Payer: Self-pay | Admitting: Plastic Surgery

## 2014-11-23 ENCOUNTER — Encounter (HOSPITAL_COMMUNITY)
Admission: RE | Admit: 2014-11-23 | Discharge: 2014-11-23 | Disposition: A | Discharge status: Home / Self Care | Payer: BC Managed Care – PPO | Source: Ambulatory Visit | Attending: General Surgery | Admitting: General Surgery

## 2014-11-23 ENCOUNTER — Encounter (HOSPITAL_COMMUNITY): Payer: Self-pay

## 2014-11-23 HISTORY — DX: Hypothyroidism, unspecified: E03.9

## 2014-11-23 HISTORY — DX: Gastro-esophageal reflux disease without esophagitis: K21.9

## 2014-11-23 HISTORY — DX: Other specified diseases of gallbladder: K82.8

## 2014-11-23 HISTORY — DX: Unspecified osteoarthritis, unspecified site: M19.90

## 2014-11-23 LAB — BASIC METABOLIC PANEL
Anion gap: 13 (ref 5–15)
BUN: 10 mg/dL (ref 6–23)
CHLORIDE: 97 meq/L (ref 96–112)
CO2: 28 meq/L (ref 19–32)
Calcium: 9.5 mg/dL (ref 8.4–10.5)
Creatinine, Ser: 0.77 mg/dL (ref 0.50–1.10)
GFR calc non Af Amer: >90 mL/min (ref >90)
Glucose, Bld: 96 mg/dL (ref 70–99)
Potassium: 3.5 mEq/L — ABNORMAL LOW (ref 3.7–5.3)
Sodium: 138 mEq/L (ref 137–147)

## 2014-11-23 LAB — CBC
HEMATOCRIT: 42 % (ref 36.0–46.0)
HEMOGLOBIN: 14.7 g/dL (ref 12.0–15.0)
MCH: 31 pg (ref 26.0–34.0)
MCHC: 35 g/dL (ref 30.0–36.0)
MCV: 88.6 fL (ref 78.0–100.0)
Platelets: 254 10*3/uL (ref 150–400)
RBC: 4.74 MIL/uL (ref 3.87–5.11)
RDW: 12.7 % (ref 11.5–15.5)
WBC: 7.9 10*3/uL (ref 4.0–10.5)

## 2014-11-23 NOTE — Progress Notes (Signed)
PCP- M. Tower, pt. Reports that she doesn't recall ever having an EKG in the past.

## 2014-11-23 NOTE — Pre-Procedure Instructions (Signed)
Diana Deleon  11/23/2014   Your procedure is scheduled on: 11/25/2014  Report to [facility] Admitting   ENTRANCE A   at 05:30 AM.  Call this number if you have problems the morning of surgery: [phone]   Remember:   Do not eat food or drink liquids after midnight. On Wednesday  Take these medicines the morning of surgery with A SIP OF WATER: Levothyroxine, Omeprazole, If needed take Tylenol for any discomfort   Do not wear jewelry, make-up or nail polish.   Do not wear lotions, powders, or perfumes. You may wear deodorant.   Do not shave 48 hours prior to surgery.   Do not bring valuables to the hospital.  [facility] is not responsible     for any belongings or valuables.               Contacts, dentures or bridgework may not be worn into surgery.   Leave suitcase in the car. After surgery it may be brought to your room.   For patients admitted to the hospital, discharge time is determined by your                treatment team.               Patients discharged the day of surgery will not be allowed to drive  Home.   Name and phone number of your driver: /w family  Special Instructions: Special Instructions: [facility] - Preparing for Surgery  Before surgery, you can play an important role.  Because skin is not sterile, your skin needs to be as free of germs as possible.  You can reduce the number of germs on you skin by washing with CHG (chlorahexidine gluconate) soap before surgery.  CHG is an antiseptic cleaner which kills germs and bonds with the skin to continue killing germs even after washing.  Please DO NOT use if you have an allergy to CHG or antibacterial soaps.  If your skin becomes reddened/irritated stop using the CHG and inform your nurse when you arrive at Short Stay.  Do not shave (including legs and underarms) for at least 48 hours prior to the first CHG shower.  You may shave your face.  Please follow these instructions carefully:   1.  Shower  with CHG Soap the night before surgery and the  morning of Surgery.  2.  If you choose to wash your hair, wash your hair first as usual with your  normal shampoo.  3.  After you shampoo, rinse your hair and body thoroughly to remove the  Shampoo.  4.  Use CHG as you would any other liquid soap.  You can apply chg directly to the skin and wash gently with scrungie or a clean washcloth.  5.  Apply the CHG Soap to your body ONLY FROM THE NECK DOWN.    Do not use on open wounds or open sores.  Avoid contact with your eyes, ears, mouth and genitals (private parts).  Wash genitals (private parts)   with your normal soap.  6.  Wash thoroughly, paying special attention to the area where your surgery will be performed.  7.  Thoroughly rinse your body with warm water from the neck down.  8.  DO NOT shower/wash with your normal soap after using and rinsing off   the CHG Soap.  9.  Pat yourself dry with a clean towel.  10.  Wear clean pajamas.            11.  Place clean sheets on your bed the night of your first shower and do not sleep with pets.  Day of Surgery  Do not apply any lotions/deodorants the morning of surgery.  Please wear clean clothes to the hospital/surgery center.   Please read over the following fact sheets that you were given: Pain Booklet, Coughing and Deep Breathing and Surgical Site Infection Prevention

## 2014-11-24 MED ORDER — CHLORHEXIDINE GLUCONATE 4 % EX LIQD
1.0000 "application " | Freq: Once | CUTANEOUS | Status: DC
  Filled 2014-11-24: qty 15

## 2014-11-24 MED ORDER — HEPARIN SODIUM (PORCINE) 5000 UNIT/ML IJ SOLN: 5000.0000 [IU] | Freq: Once | INTRAMUSCULAR | Status: DC

## 2014-11-24 MED ORDER — CEFAZOLIN SODIUM-DEXTROSE 2-3 GM-% IV SOLR
2.0000 g | INTRAVENOUS | Status: AC
  Administered 2014-11-25 (×2): 2 g via INTRAVENOUS

## 2014-11-24 MED ORDER — CEFAZOLIN SODIUM-DEXTROSE 2-3 GM-% IV SOLR
2.0000 g | INTRAVENOUS | Status: DC
  Filled 2014-11-24: qty 50

## 2014-11-25 ENCOUNTER — Ambulatory Visit (HOSPITAL_COMMUNITY): Payer: BC Managed Care – PPO | Admitting: Certified Registered"

## 2014-11-25 ENCOUNTER — Inpatient Hospital Stay (HOSPITAL_COMMUNITY)
Admission: RE | Admit: 2014-11-25 | Discharge: 2014-11-27 | DRG: 581 | Disposition: A | Discharge status: Home / Self Care | Payer: BC Managed Care – PPO | Source: Ambulatory Visit | Attending: Plastic Surgery | Admitting: Plastic Surgery

## 2014-11-25 ENCOUNTER — Encounter (HOSPITAL_COMMUNITY)
Admission: RE | Admit: 2014-11-25 | Discharge: 2014-11-25 | Disposition: A | Discharge status: Home / Self Care | Payer: BC Managed Care – PPO | Source: Ambulatory Visit | Attending: General Surgery | Admitting: General Surgery

## 2014-11-25 ENCOUNTER — Encounter (HOSPITAL_COMMUNITY)
Admission: RE | Disposition: A | Discharge status: Home / Self Care | Payer: Self-pay | Source: Ambulatory Visit | Attending: Plastic Surgery

## 2014-11-25 ENCOUNTER — Encounter (HOSPITAL_COMMUNITY): Payer: Self-pay | Admitting: *Deleted

## 2014-11-25 DIAGNOSIS — Z01810 Encounter for preprocedural cardiovascular examination: Secondary | ICD-10-CM

## 2014-11-25 DIAGNOSIS — Z170 Estrogen receptor positive status [ER+]: Secondary | ICD-10-CM

## 2014-11-25 DIAGNOSIS — I10 Essential (primary) hypertension: Secondary | ICD-10-CM | POA: Diagnosis present

## 2014-11-25 DIAGNOSIS — C50411 Malignant neoplasm of upper-outer quadrant of right female breast: Secondary | ICD-10-CM | POA: Diagnosis present

## 2014-11-25 DIAGNOSIS — Z01812 Encounter for preprocedural laboratory examination: Secondary | ICD-10-CM | POA: Diagnosis not present

## 2014-11-25 DIAGNOSIS — K219 Gastro-esophageal reflux disease without esophagitis: Secondary | ICD-10-CM | POA: Diagnosis present

## 2014-11-25 DIAGNOSIS — C50911 Malignant neoplasm of unspecified site of right female breast: Secondary | ICD-10-CM | POA: Insufficient documentation

## 2014-11-25 HISTORY — PX: SIMPLE MASTECTOMY WITH AXILLARY SENTINEL NODE BIOPSY: SHX6098

## 2014-11-25 HISTORY — PX: BREAST RECONSTRUCTION WITH PLACEMENT OF TISSUE EXPANDER AND FLEX HD (ACELLULAR HYDRATED DERMIS): SHX6295

## 2014-11-25 HISTORY — DX: Anemia, unspecified: D64.9

## 2014-11-25 HISTORY — DX: Calculus of kidney: N20.0

## 2014-11-25 SURGERY — SIMPLE MASTECTOMY WITH AXILLARY SENTINEL NODE BIOPSY
Anesthesia: Regional | Site: Chest | Laterality: Right

## 2014-11-25 MED ORDER — PROPOFOL 10 MG/ML IV BOLUS
INTRAVENOUS | Status: DC | PRN
  Administered 2014-11-25: 50 mg via INTRAVENOUS
  Administered 2014-11-25: 120 mg via INTRAVENOUS

## 2014-11-25 MED ORDER — MIDAZOLAM HCL 5 MG/5ML IJ SOLN
INTRAMUSCULAR | Status: DC | PRN
  Administered 2014-11-25: 2 mg via INTRAVENOUS

## 2014-11-25 MED ORDER — TECHNETIUM TC 99M SULFUR COLLOID FILTERED
1.0000 | Freq: Once | INTRAVENOUS | Status: AC | PRN
  Administered 2014-11-25: 1 via INTRADERMAL

## 2014-11-25 MED ORDER — LACTATED RINGERS IV SOLN
INTRAVENOUS | Status: DC | PRN
  Administered 2014-11-25 (×3): via INTRAVENOUS

## 2014-11-25 MED ORDER — METHYLENE BLUE 1 % INJ SOLN
INTRAMUSCULAR | Status: AC
  Filled 2014-11-25: qty 10

## 2014-11-25 MED ORDER — ONDANSETRON HCL 4 MG/2ML IJ SOLN
INTRAMUSCULAR | Status: DC | PRN
  Administered 2014-11-25: 4 mg via INTRAVENOUS

## 2014-11-25 MED ORDER — PHENYLEPHRINE 40 MCG/ML (10ML) SYRINGE FOR IV PUSH (FOR BLOOD PRESSURE SUPPORT)
PREFILLED_SYRINGE | INTRAVENOUS | Status: AC
  Filled 2014-11-25: qty 10

## 2014-11-25 MED ORDER — PROPOFOL 10 MG/ML IV BOLUS
INTRAVENOUS | Status: AC
  Filled 2014-11-25: qty 20

## 2014-11-25 MED ORDER — DEXAMETHASONE SODIUM PHOSPHATE 10 MG/ML IJ SOLN
INTRAMUSCULAR | Status: DC | PRN
  Administered 2014-11-25: 4 mg via INTRAVENOUS

## 2014-11-25 MED ORDER — LOSARTAN POTASSIUM-HCTZ 50-12.5 MG PO TABS: 1.0000 | ORAL_TABLET | Freq: Every day | ORAL | Status: DC

## 2014-11-25 MED ORDER — FENTANYL CITRATE 0.05 MG/ML IJ SOLN
INTRAMUSCULAR | Status: AC
  Filled 2014-11-25: qty 5

## 2014-11-25 MED ORDER — LOSARTAN POTASSIUM 50 MG PO TABS
50.0000 mg | ORAL_TABLET | Freq: Every day | ORAL | Status: DC
  Administered 2014-11-26 – 2014-11-27 (×2): 50 mg via ORAL
  Filled 2014-11-25 (×4): qty 1

## 2014-11-25 MED ORDER — SUCCINYLCHOLINE CHLORIDE 20 MG/ML IJ SOLN
INTRAMUSCULAR | Status: DC | PRN
  Administered 2014-11-25: 100 mg via INTRAVENOUS

## 2014-11-25 MED ORDER — OXYCODONE HCL 5 MG PO TABS: 5.0000 mg | ORAL_TABLET | Freq: Once | ORAL | Status: DC | PRN

## 2014-11-25 MED ORDER — SODIUM CHLORIDE 0.9 % IJ SOLN
INTRAMUSCULAR | Status: DC | PRN
  Administered 2014-11-25: 5 mL via SUBCUTANEOUS

## 2014-11-25 MED ORDER — HYDROMORPHONE HCL 1 MG/ML IJ SOLN
0.2500 mg | INTRAMUSCULAR | Status: DC | PRN
  Administered 2014-11-25 (×2): 0.5 mg via INTRAVENOUS

## 2014-11-25 MED ORDER — OMEPRAZOLE MAGNESIUM 20 MG PO TBEC
20.0000 mg | DELAYED_RELEASE_TABLET | Freq: Every day | ORAL | Status: DC
  Filled 2014-11-25 (×2): qty 1

## 2014-11-25 MED ORDER — SCOPOLAMINE 1 MG/3DAYS TD PT72
MEDICATED_PATCH | TRANSDERMAL | Status: DC | PRN
  Administered 2014-11-25: 1 via TRANSDERMAL

## 2014-11-25 MED ORDER — SODIUM CHLORIDE 0.9 % IJ SOLN
INTRAMUSCULAR | Status: AC
  Filled 2014-11-25: qty 10

## 2014-11-25 MED ORDER — DEXTROSE-NACL 5-0.45 % IV SOLN
INTRAVENOUS | Status: DC
  Administered 2014-11-25 – 2014-11-26 (×3): via INTRAVENOUS

## 2014-11-25 MED ORDER — SODIUM CHLORIDE 0.9 % IV SOLN
Freq: Once | INTRAVENOUS | Status: AC
  Administered 2014-11-25: 1000 mL
  Filled 2014-11-25: qty 1

## 2014-11-25 MED ORDER — PROPOFOL INFUSION 10 MG/ML OPTIME
INTRAVENOUS | Status: DC | PRN
  Administered 2014-11-25: 25 ug/kg/min via INTRAVENOUS

## 2014-11-25 MED ORDER — PHENYLEPHRINE HCL 10 MG/ML IJ SOLN
INTRAMUSCULAR | Status: DC | PRN
  Administered 2014-11-25: 80 ug via INTRAVENOUS
  Administered 2014-11-25 (×3): 40 ug via INTRAVENOUS

## 2014-11-25 MED ORDER — FENTANYL CITRATE 0.05 MG/ML IJ SOLN
INTRAMUSCULAR | Status: DC | PRN
  Administered 2014-11-25: 200 ug via INTRAVENOUS
  Administered 2014-11-25 (×2): 25 ug via INTRAVENOUS
  Administered 2014-11-25: 100 ug via INTRAVENOUS
  Administered 2014-11-25: 25 ug via INTRAVENOUS
  Administered 2014-11-25: 50 ug via INTRAVENOUS
  Administered 2014-11-25: 25 ug via INTRAVENOUS

## 2014-11-25 MED ORDER — METHOCARBAMOL 500 MG PO TABS
500.0000 mg | ORAL_TABLET | Freq: Four times a day (QID) | ORAL | Status: DC
  Administered 2014-11-25 – 2014-11-27 (×7): 500 mg via ORAL
  Filled 2014-11-25 (×13): qty 1

## 2014-11-25 MED ORDER — LEVOTHYROXINE SODIUM 25 MCG PO TABS
25.0000 ug | ORAL_TABLET | Freq: Every day | ORAL | Status: DC
  Administered 2014-11-26 – 2014-11-27 (×2): 25 ug via ORAL
  Filled 2014-11-25 (×3): qty 1

## 2014-11-25 MED ORDER — HYDROCHLOROTHIAZIDE 12.5 MG PO CAPS
12.5000 mg | ORAL_CAPSULE | Freq: Every day | ORAL | Status: DC
  Administered 2014-11-26 – 2014-11-27 (×2): 12.5 mg via ORAL
  Filled 2014-11-25 (×4): qty 1

## 2014-11-25 MED ORDER — 0.9 % SODIUM CHLORIDE (POUR BTL) OPTIME
TOPICAL | Status: DC | PRN
  Administered 2014-11-25 (×3): 1000 mL

## 2014-11-25 MED ORDER — DOCUSATE SODIUM 100 MG PO CAPS
100.0000 mg | ORAL_CAPSULE | Freq: Every day | ORAL | Status: DC
  Administered 2014-11-25 – 2014-11-27 (×3): 100 mg via ORAL
  Filled 2014-11-25 (×3): qty 1

## 2014-11-25 MED ORDER — LIDOCAINE HCL (CARDIAC) 20 MG/ML IV SOLN
INTRAVENOUS | Status: DC | PRN
  Administered 2014-11-25: 60 mg via INTRAVENOUS

## 2014-11-25 MED ORDER — MIDAZOLAM HCL 2 MG/2ML IJ SOLN
INTRAMUSCULAR | Status: AC
  Filled 2014-11-25: qty 2

## 2014-11-25 MED ORDER — HYDROMORPHONE HCL 2 MG PO TABS
2.0000 mg | ORAL_TABLET | ORAL | Status: DC | PRN
  Administered 2014-11-25 – 2014-11-27 (×10): 2 mg via ORAL
  Administered 2014-11-27: 4 mg via ORAL
  Filled 2014-11-25 (×8): qty 1
  Filled 2014-11-25: qty 2
  Filled 2014-11-25 (×2): qty 1

## 2014-11-25 MED ORDER — PROMETHAZINE HCL 25 MG/ML IJ SOLN: 6.2500 mg | INTRAMUSCULAR | Status: DC | PRN

## 2014-11-25 MED ORDER — EPHEDRINE SULFATE 50 MG/ML IJ SOLN
INTRAMUSCULAR | Status: DC | PRN
  Administered 2014-11-25 (×2): 10 mg via INTRAVENOUS
  Administered 2014-11-25 (×4): 5 mg via INTRAVENOUS

## 2014-11-25 MED ORDER — NEOSTIGMINE METHYLSULFATE 10 MG/10ML IV SOLN
INTRAVENOUS | Status: DC | PRN
  Administered 2014-11-25: 4 mg via INTRAVENOUS

## 2014-11-25 MED ORDER — ACETAMINOPHEN 325 MG PO TABS: 650.0000 mg | ORAL_TABLET | Freq: Four times a day (QID) | ORAL | Status: DC | PRN

## 2014-11-25 MED ORDER — ROCURONIUM BROMIDE 50 MG/5ML IV SOLN
INTRAVENOUS | Status: AC
  Filled 2014-11-25: qty 1

## 2014-11-25 MED ORDER — OXYCODONE HCL 5 MG/5ML PO SOLN: 5.0000 mg | Freq: Once | ORAL | Status: DC | PRN

## 2014-11-25 MED ORDER — HEPARIN SODIUM (PORCINE) 5000 UNIT/ML IJ SOLN
5000.0000 [IU] | Freq: Three times a day (TID) | INTRAMUSCULAR | Status: DC
  Administered 2014-11-26 – 2014-11-27 (×4): 5000 [IU] via SUBCUTANEOUS
  Filled 2014-11-25 (×9): qty 1

## 2014-11-25 MED ORDER — ROCURONIUM BROMIDE 100 MG/10ML IV SOLN
INTRAVENOUS | Status: DC | PRN
  Administered 2014-11-25: 5 mg via INTRAVENOUS
  Administered 2014-11-25: 35 mg via INTRAVENOUS
  Administered 2014-11-25: 10 mg via INTRAVENOUS

## 2014-11-25 MED ORDER — CEFAZOLIN SODIUM 1-5 GM-% IV SOLN
1.0000 g | Freq: Four times a day (QID) | INTRAVENOUS | Status: DC
  Administered 2014-11-25 – 2014-11-27 (×7): 1 g via INTRAVENOUS
  Filled 2014-11-25 (×9): qty 50

## 2014-11-25 MED ORDER — HYDROMORPHONE BOLUS VIA INFUSION
0.5000 mg | INTRAVENOUS | Status: DC | PRN
  Filled 2014-11-25: qty 1

## 2014-11-25 MED ORDER — HYDROMORPHONE HCL 1 MG/ML IJ SOLN
INTRAMUSCULAR | Status: AC
  Filled 2014-11-25: qty 1

## 2014-11-25 MED ORDER — SUCCINYLCHOLINE CHLORIDE 20 MG/ML IJ SOLN
INTRAMUSCULAR | Status: AC
  Filled 2014-11-25: qty 1

## 2014-11-25 MED ORDER — GLYCOPYRROLATE 0.2 MG/ML IJ SOLN
INTRAMUSCULAR | Status: DC | PRN
  Administered 2014-11-25: .6 mg via INTRAVENOUS

## 2014-11-25 SURGICAL SUPPLY — 84 items
APPLIER CLIP 9.375 MED OPEN (MISCELLANEOUS)
ATCH SMKEVC FLXB CAUT HNDSWH (FILTER) ×2 IMPLANT
BAG DECANTER FOR FLEXI CONT (MISCELLANEOUS) ×3 IMPLANT
BINDER BREAST LRG (GAUZE/BANDAGES/DRESSINGS) IMPLANT
BINDER BREAST XLRG (GAUZE/BANDAGES/DRESSINGS) ×3 IMPLANT
BIOPATCH RED 1 DISK 7.0 (GAUZE/BANDAGES/DRESSINGS) ×6 IMPLANT
BLADE 10 SAFETY STRL DISP (BLADE) ×3 IMPLANT
BLADE SURG 15 STRL LF DISP TIS (BLADE) ×2 IMPLANT
BLADE SURG 15 STRL SS (BLADE) ×1
CANISTER SUCTION 2500CC (MISCELLANEOUS) ×9 IMPLANT
CHLORAPREP W/TINT 26ML (MISCELLANEOUS) ×6 IMPLANT
CLIP APPLIE 9.375 MED OPEN (MISCELLANEOUS) IMPLANT
CLIP TI MEDIUM 6 (CLIP) ×3 IMPLANT
CONT SPEC 4OZ CLIKSEAL STRL BL (MISCELLANEOUS) ×3 IMPLANT
COUNTER NEEDLE 20 DBL MAG RED (NEEDLE) ×3 IMPLANT
COVER PROBE W GEL 5X96 (DRAPES) ×3 IMPLANT
COVER SURGICAL LIGHT HANDLE (MISCELLANEOUS) ×6 IMPLANT
DERMABOND ADVANCED (GAUZE/BANDAGES/DRESSINGS)
DERMABOND ADVANCED .7 DNX12 (GAUZE/BANDAGES/DRESSINGS) IMPLANT
DEVICE DISSECT PLASMABLAD 3.0S (MISCELLANEOUS) ×2 IMPLANT
DRAIN CHANNEL 19F RND (DRAIN) ×6 IMPLANT
DRAPE CHEST BREAST 15X10 FENES (DRAPES) ×3 IMPLANT
DRAPE ORTHO SPLIT 77X108 STRL (DRAPES) ×2
DRAPE PROXIMA HALF (DRAPES) ×9 IMPLANT
DRAPE SURG 17X23 STRL (DRAPES) ×6 IMPLANT
DRAPE SURG ORHT 6 SPLT 77X108 (DRAPES) ×4 IMPLANT
DRAPE UTILITY XL STRL (DRAPES) ×6 IMPLANT
DRAPE WARM FLUID 44X44 (DRAPE) ×3 IMPLANT
DRSG PAD ABDOMINAL 8X10 ST (GAUZE/BANDAGES/DRESSINGS) ×6 IMPLANT
DRSG SORBAVIEW 3.5X5-5/16 MED (GAUZE/BANDAGES/DRESSINGS) ×6 IMPLANT
DRSG TEGADERM 4X4.75 (GAUZE/BANDAGES/DRESSINGS) ×3 IMPLANT
ELECT BLADE 6.5 EXT (BLADE) IMPLANT
ELECT CAUTERY BLADE 6.4 (BLADE) ×6 IMPLANT
ELECT REM PT RETURN 9FT ADLT (ELECTROSURGICAL) ×9
ELECTRODE REM PT RTRN 9FT ADLT (ELECTROSURGICAL) ×6 IMPLANT
EVACUATOR SILICONE 100CC (DRAIN) ×6 IMPLANT
EVACUATOR SMOKE ACCUVAC VALLEY (FILTER) ×1
EXPANDER BREAST CONT 800CC (Breast) ×3 IMPLANT
GLOVE BIO SURGEON STRL SZ7 (GLOVE) ×3 IMPLANT
GLOVE BIO SURGEON STRL SZ7.5 (GLOVE) ×3 IMPLANT
GLOVE BIO SURGEON STRL SZ8 (GLOVE) ×3 IMPLANT
GLOVE BIOGEL PI IND STRL 7.0 (GLOVE) ×4 IMPLANT
GLOVE BIOGEL PI IND STRL 8 (GLOVE) ×4 IMPLANT
GLOVE BIOGEL PI INDICATOR 7.0 (GLOVE) ×2
GLOVE BIOGEL PI INDICATOR 8 (GLOVE) ×2
GLOVE SS BIOGEL STRL SZ 7.5 (GLOVE) ×2 IMPLANT
GLOVE SUPERSENSE BIOGEL SZ 7.5 (GLOVE) ×1
GLOVE SURG SS PI 7.0 STRL IVOR (GLOVE) ×3 IMPLANT
GOWN STRL REUS W/ TWL LRG LVL3 (GOWN DISPOSABLE) ×10 IMPLANT
GOWN STRL REUS W/ TWL XL LVL3 (GOWN DISPOSABLE) ×4 IMPLANT
GOWN STRL REUS W/TWL LRG LVL3 (GOWN DISPOSABLE) ×5
GOWN STRL REUS W/TWL XL LVL3 (GOWN DISPOSABLE) ×2
GRAFT FLEX HD 8X16 THICK (Tissue Mesh) ×3 IMPLANT
KIT BASIN OR (CUSTOM PROCEDURE TRAY) ×6 IMPLANT
KIT ROOM TURNOVER OR (KITS) ×6 IMPLANT
LIQUID BAND (GAUZE/BANDAGES/DRESSINGS) ×6 IMPLANT
MARKER SKIN DUAL TIP RULER LAB (MISCELLANEOUS) ×3 IMPLANT
NEEDLE 18GX1X1/2 (RX/OR ONLY) (NEEDLE) ×3 IMPLANT
NEEDLE HYPO 25GX1X1/2 BEV (NEEDLE) ×3 IMPLANT
NS IRRIG 1000ML POUR BTL (IV SOLUTION) ×9 IMPLANT
PACK GENERAL/GYN (CUSTOM PROCEDURE TRAY) ×6 IMPLANT
PAD ARMBOARD 7.5X6 YLW CONV (MISCELLANEOUS) ×6 IMPLANT
PLASMABLADE 3.0S (MISCELLANEOUS) ×3
PREFILTER EVAC NS 1 1/3-3/8IN (MISCELLANEOUS) ×3 IMPLANT
SET ASEPTIC TRANSFER (MISCELLANEOUS) ×3 IMPLANT
SPECIMEN JAR X LARGE (MISCELLANEOUS) ×3 IMPLANT
SPONGE LAP 18X18 X RAY DECT (DISPOSABLE) ×3 IMPLANT
STAPLER VISISTAT 35W (STAPLE) ×3 IMPLANT
SUT ETHILON 2 0 FS 18 (SUTURE) IMPLANT
SUT MNCRL AB 3-0 PS2 18 (SUTURE) ×6 IMPLANT
SUT MNCRL AB 4-0 PS2 18 (SUTURE) ×3 IMPLANT
SUT PDS AB 3-0 SH 27 (SUTURE) ×12 IMPLANT
SUT PROLENE 3 0 PS 2 (SUTURE) ×6 IMPLANT
SUT SILK 2 0 SH (SUTURE) ×3 IMPLANT
SUT VIC AB 2-0 BRD 54 (SUTURE) ×3 IMPLANT
SUT VIC AB 3-0 54X BRD REEL (SUTURE) IMPLANT
SUT VIC AB 3-0 BRD 54 (SUTURE)
SUT VIC AB 3-0 SH 18 (SUTURE) ×3 IMPLANT
SYR BULB IRRIGATION 50ML (SYRINGE) ×3 IMPLANT
SYR CONTROL 10ML LL (SYRINGE) ×3 IMPLANT
TOWEL OR 17X24 6PK STRL BLUE (TOWEL DISPOSABLE) ×6 IMPLANT
TOWEL OR 17X26 10 PK STRL BLUE (TOWEL DISPOSABLE) ×6 IMPLANT
TRAY FOLEY CATH 14FRSI W/METER (CATHETERS) ×3 IMPLANT
TUBE CONNECTING 12X1/4 (SUCTIONS) ×9 IMPLANT

## 2014-11-25 NOTE — Transfer of Care (Signed)
Immediate Anesthesia Transfer of Care Note  Patient: Diana Deleon  Procedure(s) Performed: Procedure(s): RIGHT TOTAL  MASTECTOMY WITH AXILLARY SENTINEL NODE BIOPSY  (Right) RIGHT BREAST RECONSTRUCTION WITH PLACEMENT OF TISSUE EXPANDER AND USE OF ACELLULAR [place]  (Right)  Patient Location: PACU  Anesthesia Type:General  Level of Consciousness: awake, alert , oriented and sedated  Airway & Oxygen Therapy: Patient connected to nasal cannula oxygen  Post-op Assessment: Report given to PACU RN  Post vital signs: stable  Complications: No apparent anesthesia complications

## 2014-11-25 NOTE — Op Note (Signed)
Preoperative Diagnosis: cancer of right breast  Postoprative Diagnosis: cancer of right breast  Procedure: Procedure(s): RIGHT TOTAL  MASTECTOMY WITH AXILLARY SENTINEL NODE BIOPSY    Surgeon: Excell Seltzer T   Assistants: None  Anesthesia:  General endotracheal anesthesia  Indications: Patient is a 53 year old female with a recent diagnosis of multicentric cancer of the right breast with invasive and DCIS in the upper outer quadrant and a second area of DCIS discovered on MI and confirmed on biopsy in the medial right breast. After discussion of options and risks detailed elsewhere we have elected to proceed with right total mastectomy with axillary sentinel lymph node biopsy and plan to proceed with immediate reconstruction by Dr. Harlow Mares.    Procedure Detail:  Preoperatively the patient underwent injection of 1 mCi Of technetium sulfur colloid intradermally in the right nipple under sedation. She was taken to the operating room, placed in the supine position on the operating table and general endotracheal anesthesia induced. Foley catheter was placed. She was carefully positioned with arms extended. Under sterile technique after patient timeout I injected 5 mL of dilute methylene blue subcutaneously beneath the right nipple and massaged this for several minutes. Following this the entire right breast, anterior chest, axilla and upper arm were widely sterilely prepped and draped. She received preoperative IV antibiotics. PAS replaced. Patient timeout was again performed and correct procedure verified. I made a skin sparing elliptical transverse incision encompassing the nipple areolar complex using the plasma blade. Skin and subcutaneous flaps were then developed with the plasma blade superiorly up toward the clavicle, medially toward the edge of the sternum, inferiorly to the inframammary crease which was marked preoperatively and laterally out toward the anterior border of the latissimus  dorsi which was completely defined. At these points the dissection was deepened down to the chest wall. The breast was then reflected up off the chest wall with the plasma blade preserving the pectoralis fascia as much as possible. Dissection progressed laterally and the specimen was freed off of the serratus and the lateral border of the pectoralis was defined and dissected and mobilized. Using the neoprobe I then localized a hot area in the right axilla. The clavipectoral fascia was incised over this and using blunt dissection I was able to dissected down upon and identify a bright blue lymph node with very high counts of approximately 4000. It was removed intact and sent for touch prep as hot blue right axillary sentinel lymph node. This ultimately returned as negative for malignancy. A second node with somewhat higher counts was identified in the axilla and sent for permanent section as sentinel lymph node #2. The final attachments along the serratus and latissimus were dissected up toward the axilla until the specimen was freed to the low axilla and I came across this with clamps and the specimen was divided and removed and the lower axillary tissue tied with 3-0 Vicryl. The specimen was oriented and sent for permanent section. The wound was thoroughly irrigated and hemostasis obtained. Dr. Harlow Mares and proceeded with reconstruction is planned.    Findings: As above  Estimated Blood Loss:  less than 100 mL         Drains: per Dr. Harlow Mares  Blood Given: none          Specimens: #1 right total mastectomy    #2 hot blue right axillary sentinel lymph node   #3 hot non-blue right axillary sentinel lymph node   #4 additional tissue superior flap  Complications:  * No complications entered in OR log *         Disposition: PACU - hemodynamically stable.         Condition: stable

## 2014-11-25 NOTE — Anesthesia Procedure Notes (Signed)
Procedure Name: Intubation Date/Time: 11/25/2014 7:47 AM Performed by: Maeola Harman Pre-anesthesia Checklist: Patient identified, Emergency Drugs available, Suction available, Patient being monitored and Timeout performed Patient Re-evaluated:Patient Re-evaluated prior to inductionOxygen Delivery Method: Circle system utilized Preoxygenation: Pre-oxygenation with 100% oxygen Intubation Type: IV induction Ventilation: Mask ventilation without difficulty Laryngoscope Size: Mac and 3 Grade View: Grade I Tube type: Oral Tube size: 7.5 mm Number of attempts: 1 Airway Equipment and Method: Stylet Placement Confirmation: ETT inserted through vocal cords under direct vision,  positive ETCO2 and breath sounds checked- equal and bilateral Secured at: 22 cm Tube secured with: Tape Dental Injury: Teeth and Oropharynx as per pre-operative assessment  Comments: Easy atraumatic induction and intubation with MAC 3 blade.  Dr. Glennon Mac verified placement.  Waldron Session, CRNA

## 2014-11-25 NOTE — Interval H&P Note (Signed)
History and Physical Interval Note:  11/25/2014 7:32 AM  Diana Deleon  has presented today for surgery, with the diagnosis of cancer of right breast  The various methods of treatment have been discussed with the patient and family. After consideration of risks, benefits and other options for treatment, the patient has consented to  Procedure(s): RIGHT TOTAL  MASTECTOMY WITH AXILLARY SENTINEL NODE BIOPSY POSSIBLE RIGHT AXILLARY DISSECTION (Right) RIGHT BREAST RECONSTRUCTION WITH PLACEMENT OF TISSUE EXPANDER AND POSSIBLE USE OF ACELLULAR [place]  (Right) as a surgical intervention .  The patient's history has been reviewed, patient examined, no change in status, stable for surgery.  I have reviewed the patient's chart and labs.  Questions were answered to the patient's satisfaction.     Riddik Senna T

## 2014-11-25 NOTE — Brief Op Note (Signed)
11/25/2014  12:40 PM  PATIENT:  Diana Deleon  53 y.o. female  PRE-OPERATIVE DIAGNOSIS:  cancer of right breast  POST-OPERATIVE DIAGNOSIS:  cancer of right breast  PROCEDURE:  Procedure(s): RIGHT TOTAL  MASTECTOMY WITH AXILLARY SENTINEL NODE BIOPSY  (Right) RIGHT BREAST RECONSTRUCTION WITH PLACEMENT OF TISSUE EXPANDER AND USE OF ACELLULAR [place]  (Right)  SURGEON:  Surgeon(s) and Role: Panel 1:    * Excell Seltzer, MD - Primary  Panel 2:    * Crissie Reese, MD - Primary  PHYSICIAN ASSISTANT:   ASSISTANTS: none   ANESTHESIA:   general  EBL:  Total I/O In: 2400 [I.V.:2400] Out: 495 [Urine:295; Blood:200]  BLOOD ADMINISTERED:none  DRAINS: (2) Jackson-Pratt drain(s) with closed bulb suction in the right mastectomy space   LOCAL MEDICATIONS USED:  NONE  SPECIMEN:  No Specimen  DISPOSITION OF SPECIMEN:  N/A  COUNTS:  YES  TOURNIQUET:  * No tourniquets in log *  DICTATION: .Other Dictation: Dictation Number [id number]  PLAN OF CARE: Admit to inpatient   PATIENT DISPOSITION:  PACU - hemodynamically stable.   Delay start of Pharmacological VTE agent (>24hrs) due to surgical blood loss or risk of bleeding: no

## 2014-11-25 NOTE — H&P (Signed)
History of Present Illness Diana Kitchen [name] MD; 11/04/2014 9:44 Deleon) Patient words: Dx of Breast CA.  The patient is a 53 year old female who presents with breast cancer. She is a 53 YO post menopausal female referred by Diana Deleon for evaluation of recently diagnosed carcinoma of the right breast. She recently presented for a screening mamogram revealing a new area of distortion in the upper outer quadrant of the right breast. Subsequent imaging included diagnostic mamogram showing persistent distortton in the upper outer quadrant of the right breast with an area of punctate calcifications and ultrasound showing aa large area of acoustic shadowing at the 10:0e position of the right breast 7 cm from the nipple estimated at 4.2 cm.. An ultrasound guided breast biopsy was performed on 10/20/2014 with pathology revealing iinvasive and in situ carcinoma of the breast. ssubsequent bilateral breast MRI was performed showing a 2.3 cm irregular enhancing mass in psy-proven carcinomat breast compatible with a biopsy-proven carcinoma. Also a 2.3 cm linear area of neastar enhancement within the upper inner right breast at the junction of the middle and posterior third was seen which raised concern for DCIS. MRI guided biopsy of this second area of linear enhancement this has revealed ductal carcinoma in situ. Findings at that time were the following: Tumor size: 2.3 cm Tumor grade: 2 Estrogen Receptor:positive Progesterone Receptor: as attentive Her-2 neu: negative Lymph node status: negative   Other Problems Diana Deleon) Breast Cancer Cholelithiasis Gastroesophageal Reflux Disease Hemorrhoids High blood pressure Kidney Stone Lump In Breast Thyroid Disease  Past Surgical History Diana Deleon) Breast Biopsy Right. Gallbladder Surgery - Laparoscopic Hysterectomy (not due to cancer) - Partial  Diagnostic Studies  History Diana Deleon) Colonoscopy >10 years ago 5-10 years ago Mammogram within last year  Allergies Diana Deleon) Norvasc *CALCIUM CHANNEL BLOCKERS*  Medication History Diana Deleon) Levothyroxine Sodium (25MCG Tablet, Oral daily) Active. Losartan Potassium-HCTZ (50-12.5MG Tablet, Oral daily) Active. PriLOSEC (20MG Capsule DR, Oral daily) Active. Medications Reconciled  Social History Multimedia programmer, RN, Deleon; 11/04/2014 9:30 Deleon) Alcohol use Occasional alcohol use. Caffeine use Carbonated beverages. No drug use Tobacco use Never smoker.  Family History (Diana Deleon) Alcohol Abuse Father. Hypertension Father, Mother. Respiratory Condition Father.  Pregnancy / Birth History (Diana Deleon) Age at menarche 16 years. Age of menopause [age] Gravida 3 Irregular periods Maternal age 12-25 Para 3  Review of Systems Diana Deleon) General Not Present- Appetite Loss, Chills, Fatigue, Fever, Night Sweats, Weight Gain and Weight Loss. Skin Present- Dryness. Not Present- Change in Wart/Mole, Hives, Jaundice, New Lesions, Non-Healing Wounds, Rash and Ulcer. HEENT Present- Wears glasses/contact lenses. Not Present- Earache, Hearing Loss, Hoarseness, Nose Bleed, Oral Ulcers, Ringing in the Ears, Seasonal Allergies, Sinus Pain, Sore Throat, Visual Disturbances and Yellow Eyes. Respiratory Not Present- Bloody sputum, Chronic Cough, Difficulty Breathing, Snoring and Wheezing. Breast Present- Breast Mass. Not Present- Breast Pain, Nipple Discharge and Skin Changes. Cardiovascular Not Present- Chest Pain, Difficulty Breathing Lying Down, Leg Cramps, Palpitations, Rapid Heart Rate, Shortness of Breath and Swelling of Extremities. Gastrointestinal Not Present- Abdominal Pain, Bloating,  Bloody Stool, Change in Bowel Habits, Chronic diarrhea, Constipation, Difficulty Swallowing, Excessive gas, Gets full quickly at meals, Hemorrhoids, Indigestion, Nausea, Rectal Pain and Vomiting. Female Genitourinary Not Present- Frequency, Nocturia, Painful Urination, Pelvic  Pain and Urgency. Musculoskeletal Not Present- Back Pain, Joint Pain, Joint Stiffness, Muscle Pain, Muscle Weakness and Swelling of Extremities. Neurological Not Present- Decreased Memory, Fainting, Headaches, Numbness, Seizures, Tingling, Tremor, Trouble walking and Weakness. Psychiatric Not Present- Anxiety, Bipolar, Change in Sleep Pattern, Depression, Fearful and Frequent crying. Endocrine Not Present- Cold Intolerance, Excessive Hunger, Hair Changes, Heat Intolerance, Hot flashes and New Diabetes. Hematology Not Present- Easy Bruising, Excessive bleeding, Gland problems, HIV and Persistent Infections.   Vitals Diana hygienist, Deleon; 11/04/2014 9:34 Deleon) 11/04/2014 9:32 Deleon Weight: 213.8 lb Height: 64in Body Surface Area: 2.09 m Body Mass Index: 36.7 kg/m Temp.: 98.13F(Oral)  Pulse: 84 (Regular)  Resp.: 22 (Unlabored)  BP: 140/92 (Sitting, Left Arm, Standard)    Physical Exam Diana Kitchen T. Donnelle Rubey MD; 11/04/2014 9:56 Deleon) General Note: General: Alert, well-developed and well nourished Caucasian female, in no distress Skin: Warm and dry without rash or infection. HEENT: No palpable masses or thyromegaly. Sclera nonicteric. Pupils equal round and reactive. Oropharynx clear. Lymph nodes: No cervical, supraclavicular, or inguinal nodes palpable. Lungs: Breath sounds clear and equal. No wheezing or increased work of breathing. Breasts: Thickening lateral right breast possibly postbiopsy change. Cardiovascular: Regular rate and rhythm without murmer. Abdomen: Nondistended. Soft and nontender. Extremities: No edema or joint swelling or deformity. No chronic venous stasis changes. Neurologic: Alert and  fully oriented. Gait normal. No focal weakness. Psychiatric: Normal mood and affect. Thought content appropriate with normal judgement and insight     Assessment & Plan Diana Kitchen T. Alaysha Jefcoat MD; 11/04/2014 Deleon Deleon) BREAST CANCER, RIGHT (174.9  C50.911) Impression: 53 year old female with a new diagnosis of cancer of the right breast, upper outer quadrant. Clinical stage 1B, ER+, PR+, HER-2-. Now the biopsy of the second area remotely in her right breast has shown high-grade ductal carcinoma in situ. Both of these areas are at least 2.3 cm and there was concern that the invasive area might be larger on ultrasound. She has multicentric cancer and I believe her best choice would be total mastectomy. I discussed this with her and she understands and is in agreement. She is interested in reconstruction and I believe would be a candidate for immediate reconstruction. We will make a referral for plastic surgery and try to get her scheduled for surgery as soon as possible. I discussed total mastectomy with sentinel lymph node biopsy and possible axillary dissection and all her questions were answered. Current Plans  Referred to Surgery ? Plastic, for evaluation and follow up (Plastic Surgery).

## 2014-11-25 NOTE — Anesthesia Preprocedure Evaluation (Addendum)
Anesthesia Evaluation  Patient identified by MRN, date of birth, ID band Patient awake    Reviewed: Allergy & Precautions, H&P , NPO status , Patient's Chart, lab work & pertinent test results  History of Anesthesia Complications Negative for: history of anesthetic complications  Airway Mallampati: I  TM Distance: >3 FB Neck ROM: Full    Dental  (+) Teeth Intact, Dental Advisory Given   Pulmonary neg pulmonary ROS,  breath sounds clear to auscultation        Cardiovascular hypertension, Pt. on medications - angina- Past MI and - CHF Rhythm:Regular     Neuro/Psych  Headaches, negative psych ROS   GI/Hepatic Neg liver ROS, GERD-  Medicated and Controlled,  Endo/Other  Hypothyroidism   Renal/GU negative Renal ROS     Musculoskeletal  (+) Arthritis -,   Abdominal (+)  Abdomen: soft. Bowel sounds: normal.  Peds  Hematology negative hematology ROS (+)   Anesthesia Other Findings   Reproductive/Obstetrics                           Anesthesia Physical Anesthesia Plan  ASA: II  Anesthesia Plan: General and Regional   Post-op Pain Management:    Induction: Intravenous  Airway Management Planned: LMA and Oral ETT  Additional Equipment: None  Intra-op Plan:   Post-operative Plan: Extubation in OR  Informed Consent: I have reviewed the patients History and Physical, chart, labs and discussed the procedure including the risks, benefits and alternatives for the proposed anesthesia with the patient or authorized representative who has indicated his/her understanding and acceptance.   Dental advisory given  Plan Discussed with: CRNA and Surgeon  Anesthesia Plan Comments:         Anesthesia Quick Evaluation

## 2014-11-26 ENCOUNTER — Encounter (HOSPITAL_COMMUNITY): Payer: Self-pay | Admitting: General Surgery

## 2014-11-26 MED ORDER — OMEPRAZOLE 20 MG PO CPDR
20.0000 mg | DELAYED_RELEASE_CAPSULE | Freq: Every day | ORAL | Status: DC
  Administered 2014-11-26 – 2014-11-27 (×2): 20 mg via ORAL
  Filled 2014-11-26 (×3): qty 1

## 2014-11-26 NOTE — Progress Notes (Signed)
Pt ambulated around unit twice this morning and tolerated well.  Will continue to monitor and ambulate pt/. Diana Deleon

## 2014-11-26 NOTE — Op Note (Signed)
NAMEREMY, DIA                 ACCOUNT NO.:  [id number]  MEDICAL RECORD NO.:  [id number]  LOCATION:                                 FACILITY:  PHYSICIAN:  Crissie Reese, M.D.     DATE OF BIRTH:  October 08, 1961  DATE OF PROCEDURE:  11/25/2014 DATE OF DISCHARGE:  11/25/2014                              OPERATIVE REPORT   PREOPERATIVE DIAGNOSIS:  Right breast cancer.  POSTOPERATIVE DIAGNOSIS:  Right breast cancer.  PROCEDURE PERFORMED: 1. Right breast reconstruction immediate with tissue expander. 2. Distinct procedure right chest wall reconstruction for inadequate     muscle and resetting of inframammary fold using acellular dermal     matrix greater than 100 cm2.  SURGEON:  Crissie Reese, M.D.  ANESTHESIA:  General.  ESTIMATED BLOOD LOSS:  10 mL.  DRAINS:  Two 19-French.  CLINICAL NOTE:  A 53 year old woman has right breast cancer and is having a right mastectomy with a sentinel lymph node and desires reconstruction.  The options for reconstruction were discussed.  She selected tissue expander placement as a planned staged procedure for eventual placement of an implant.  The procedure was discussed with her in great detail.  We did discuss the fact that it did not appear that she would need postoperative radiation that if she did it would change her reconstruction and might even require further surgeries due to problems with radiation over a reconstruction with a prosthesis.  The risks and possible complications of the procedure were discussed.  These included, but were not limited to, bleeding, infection, healing problems, scarring, loss of sensation, fluid accumulations, anesthesia risk complications, pneumothorax, DVT, PE, contour deformities, contour deformities of the periphery of the reconstruction, loss of tissue including loss of skin of the mastectomy flaps, failure of device, capsular contracture, displacement device, wrinkles and ripples, asymmetry, chronic pain,  and overall disappointment.  She understood all of this as well as the possible use of acellular dermal matrix in her reconstruction.  She wished to proceed.  DESCRIPTION OF PROCEDURE:  The patient was in the operating room, and the right mastectomy had been completed.  The mastectomy sites were inspected and noted to be in excellent condition and good color with bright red bleeding at skin edges consistent viability.  Irrigation with saline as well as antibiotic solution.  On evaluation, it was felt that there was not adequate muscle and the acellular dermal matrix will be needed for adequate coverage as well as for re-setting of the inframammary fold.  The acellular dermal matrix was placed in saline to soak.  This was an 8 x 16 piece thick.  The pectoralis muscle was then lifted along the flap orders and released medially as well as some inferiorly and then laterally the serratus anterior was lifted for short distance and the irrigation with saline as well as antibiotic solution. Great care taken to avoid damage to underlying chest cavity and the space was developed to the dimensions of the planned expander.  The expander was soaked in antibiotic solution.  After thoroughly cleaning gloves, it was prepared by removing air and then placing 150 mL of sterile saline using a closed filling system.  Attention then directed back to the space which was noted to be with good hemostasis.  The acellular dermal matrix was then placed in antibiotic solution and pie crusting was performed in a limited fashion throughout the acellular dermal matrix.  Again, the gloves cleaned thoroughly and the expander was positioned.  The inferior tab was used with a 3-0 Vicryl suture through it and into the tissues to stabilize the expander in position. The expander was having been positioned and care was taken to ensure there were no folds or creases or wrinkles.  It was positioned for medial as possible.  The  acellular dermal matrix was then brought into the field.  It soaked for approximately 15 minutes of saline and 15 minutes antibiotic solution.  It was irrigated 1 more time with antibiotic solution prior to the position.  Care was taken to make sure it was positioned with the dermal side facing upwards towards the overlying mastectomy flaps and the epidermal side downward towards the tissue expander.  The piece was positioned inferomedially and secured around its periphery using 3-0 PDS sutures with great care taken to avoid damage to underlying tissue expander which was kept under direct vision all times.  Laterally, it was also covered with even larger piece of acellular dermal matrix again with the dermal side facing up towards the mastectomy flaps and the epidermal side down to the tissue expander. It was secured to the border of the pectoralis major muscle as well as the edges of the serratus muscle that had been lifted for short distance again using 3-0 PDS sutures.  Great care was again taken to avoid damage to underlying tissue expander which was kept under direct vision at all times.  The 19-French drains were positioned, brought through separate stab wounds, one inferomedial and the other inferolateral and this avoided a  drain right in the area of the inframammary fold where the acellular dermal matrix had been secured.  These drains were secured with 3-0 Prolene sutures.  The skin was redraped and a small dog-ear was removed at the medial aspect.  Thorough irrigation with saline and as well as antibiotic solution and then meticulous hemostasis again with the electrocautery.  The skin closure with 3-0 Monocryl interrupted inverted deep dermal sutures, followed by Dermabond.  The drains were dressed with BioPatch and the SorbaView sterile dressing.  Dry sterile dressings were then applied over the mastectomy site and the chest vest was then positioned and she was transferred to  the recovery room in stable having tolerated procedure well.     Crissie Reese, M.D.     DB/MEDQ  D:  11/25/2014  T:  11/25/2014  Job:  [id number]

## 2014-11-26 NOTE — Progress Notes (Signed)
Subjective: Sore but excellent pain control. No nausea. Voiding well.  Objective: Vital signs in last 24 hours: Temp:  [97.7 F (36.5 C)-98.8 F (37.1 C)] 98.8 F (37.1 C) (12/11 0626) Pulse Rate:  [73-101] 85 (12/11 0626) Resp:  [11-21] 16 (12/11 0626) BP: (113-137)/(60-77) 122/72 mmHg (12/11 0626) SpO2:  [93 %-97 %] 97 % (12/11 0626)  Intake/Output from previous day: 12/10 0701 - 12/11 0700 In: 3310 [P.O.:240; I.V.:3070] Out: 1300 [Urine:895; Drains:205; Blood:200] Intake/Output this shift:    Operative sites: Mastectomy flaps viable. Tissue expander appears to be in good position. Drains functioning. Drainage thin. No evidence of bleeding or infection. Cyst over manubrium looks much better..   Recent Labs  11/23/14 1541  WBC 7.9  HGB 14.7  HCT 42.0  NA 138  K 3.5*  CL 97  CO2 28  BUN 10  CREATININE 0.77    Studies/Results: Nm Sentinel Node Inj-no Rpt (breast)  11/25/2014   CLINICAL DATA: Cancer right breast   Sulfur colloid was injected intradermally by the nuclear medicine  technologist for breast cancer sentinel node localization.     Assessment/Plan: Ambulate. DVT prophylaxis continues. Antibiotic prophylaxis continues.   LOS: 1 day    Harlow Mares, Brenee Gajda M 11/26/2014 8:26 AM

## 2014-11-26 NOTE — Progress Notes (Signed)
Patient ID: Diana Deleon, female   DOB: 09-20-1961, 53 y.o.   MRN: [id number] 1 Day Post-Op  Subjective: No C/O, good pain control. Up and about  Objective: Vital signs in last 24 hours: Temp:  [97.7 F (36.5 C)-98.8 F (37.1 C)] 98.2 F (36.8 C) (12/11 1310) Pulse Rate:  [73-101] 86 (12/11 1310) Resp:  [16-18] 18 (12/11 1310) BP: (113-137)/(60-77) 114/68 mmHg (12/11 1310) SpO2:  [95 %-100 %] 100 % (12/11 1310) Last BM Date:  (PTA)  Intake/Output from previous day: 12/10 0701 - 12/11 0700 In: 3310 [P.O.:240; I.V.:3070] Out: 1300 [Urine:895; Drains:205; Blood:200] Intake/Output this shift:    General appearance: alert, cooperative and no distress Incision/Wound: Clean and dry, no,evidence of bleeding, drainage clearing  Lab Results:   Recent Labs  11/23/14 1541  WBC 7.9  HGB 14.7  HCT 42.0  PLT 254   BMET  Recent Labs  11/23/14 1541  NA 138  K 3.5*  CL 97  CO2 28  GLUCOSE 96  BUN 10  CREATININE 0.77  CALCIUM 9.5     Studies/Results: Nm Sentinel Node Inj-no Rpt (breast)  11/25/2014   CLINICAL DATA: Cancer right breast   Sulfur colloid was injected intradermally by the nuclear medicine  technologist for breast cancer sentinel node localization.     Anti-infectives: Anti-infectives    Start     Dose/Rate Route Frequency Ordered Stop   11/25/14 1700  ceFAZolin (ANCEF) IVPB 1 g/50 mL premix     1 g100 mL/hr over 30 Minutes Intravenous 4 times per day 11/25/14 1428 11/28/14 1759   11/25/14 0730  bacitracin 50,000 Units, gentamicin (GARAMYCIN) 80 mg, ceFAZolin (ANCEF) 1 g in sodium chloride 0.9 % 1,000 mL      Irrigation Once 11/25/14 0721 11/25/14 1010   11/25/14 0600  ceFAZolin (ANCEF) IVPB 2 g/50 mL premix  Status:  Discontinued     2 g100 mL/hr over 30 Minutes Intravenous On call to O.R. 11/24/14 1348 11/25/14 1419   11/25/14 0600  ceFAZolin (ANCEF) IVPB 2 g/50 mL premix     2 g100 mL/hr over 30 Minutes Intravenous On call to O.R. 11/24/14 1958 11/25/14  1150      Assessment/Plan: s/p Procedure(s): RIGHT TOTAL  MASTECTOMY WITH AXILLARY SENTINEL NODE BIOPSY  RIGHT BREAST RECONSTRUCTION WITH PLACEMENT OF TISSUE EXPANDER AND USE OF ACELLULAR DERMRAL MATRIX  Doing well without apparent complication Probable discharge tomorrow   LOS: 1 day    Trueman Worlds T 11/26/2014

## 2014-11-27 MED ORDER — CEPHALEXIN 500 MG PO CAPS: 500.0000 mg | ORAL_CAPSULE | Freq: Four times a day (QID) | ORAL | Status: DC

## 2014-11-27 MED ORDER — METHOCARBAMOL 500 MG PO TABS: 500.0000 mg | ORAL_TABLET | Freq: Four times a day (QID) | ORAL | Status: DC

## 2014-11-27 MED ORDER — DSS 100 MG PO CAPS: 100.0000 mg | ORAL_CAPSULE | Freq: Every day | ORAL | Status: DC

## 2014-11-27 MED ORDER — ENOXAPARIN SODIUM 40 MG/0.4ML ~~LOC~~ SOLN: 40.0000 mg | SUBCUTANEOUS | Status: DC

## 2014-11-27 MED ORDER — HYDROMORPHONE HCL 2 MG PO TABS: 2.0000 mg | ORAL_TABLET | ORAL | Status: DC | PRN

## 2014-11-27 NOTE — Progress Notes (Signed)
D/c to home with family. D/c instructions given and patient and family demonstrated understanding, Rx's given and explain to patient and family.  Pain med given at d/c

## 2014-11-27 NOTE — Anesthesia Postprocedure Evaluation (Signed)
  Anesthesia Post-op Note  Patient: Diana Deleon  Procedure(s) Performed: Procedure(s): RIGHT TOTAL  MASTECTOMY WITH AXILLARY SENTINEL NODE BIOPSY  (Right) RIGHT BREAST RECONSTRUCTION WITH PLACEMENT OF TISSUE EXPANDER AND USE OF ACELLULAR Rodessa  (Right)  Patient Location: PACU  Anesthesia Type:General  Level of Consciousness: awake  Airway and Oxygen Therapy: Patient Spontanous Breathing  Post-op Pain: mild  Post-op Assessment: Post-op Vital signs reviewed, Patient's Cardiovascular Status Stable, Respiratory Function Stable, Patent Airway, No signs of Nausea or vomiting and Pain level controlled  Post-op Vital Signs: Reviewed and stable  Last Vitals:  Filed Vitals:   11/27/14 0555  BP: 142/82  Pulse: 105  Temp: 36.9 C  Resp: 16    Complications: No apparent anesthesia complications

## 2014-11-27 NOTE — Progress Notes (Signed)
Patient ID: Diana Deleon, female   DOB: 1961-07-25, 53 y.o.   MRN: [id number] 2 Days Post-Op  Subjective: No complaints this morning  Objective: Vital signs in last 24 hours: Temp:  [97.9 F (36.6 C)-98.5 F (36.9 C)] 98.4 F (36.9 C) (12/12 0555) Pulse Rate:  [86-105] 105 (12/12 0555) Resp:  [16-18] 16 (12/12 0555) BP: (114-142)/(68-82) 142/82 mmHg (12/12 0555) SpO2:  [90 %-100 %] 90 % (12/12 0555) Last BM Date: 11/24/14  Intake/Output from previous day: 12/11 0701 - 12/12 0700 In: 240 [P.O.:240] Out: 105 [Drains:105] Intake/Output this shift:    General appearance: alert, cooperative and no distress Breasts: normal appearance, no masses or tenderness, skin flaps are healthy with no  unusual bleeding or bruising and JP drainage is clearing  Lab Results:  No results for input(s): WBC, HGB, HCT, PLT in the last 72 hours. BMET No results for input(s): NA, K, CL, CO2, GLUCOSE, BUN, CREATININE, CALCIUM in the last 72 hours.   Studies/Results: No results found.  Anti-infectives: Anti-infectives    Start     Dose/Rate Route Frequency Ordered Stop   11/25/14 1700  ceFAZolin (ANCEF) IVPB 1 g/50 mL premix     1 g100 mL/hr over 30 Minutes Intravenous 4 times per day 11/25/14 1428 11/28/14 1759   11/25/14 0730  bacitracin 50,000 Units, gentamicin (GARAMYCIN) 80 mg, ceFAZolin (ANCEF) 1 g in sodium chloride 0.9 % 1,000 mL      Irrigation Once 11/25/14 0721 11/25/14 1010   11/25/14 0600  ceFAZolin (ANCEF) IVPB 2 g/50 mL premix  Status:  Discontinued     2 g100 mL/hr over 30 Minutes Intravenous On call to O.R. 11/24/14 1348 11/25/14 1419   11/25/14 0600  ceFAZolin (ANCEF) IVPB 2 g/50 mL premix     2 g100 mL/hr over 30 Minutes Intravenous On call to O.R. 11/24/14 1958 11/25/14 1150      Assessment/Plan: s/p Procedure(s): RIGHT TOTAL  MASTECTOMY WITH AXILLARY SENTINEL NODE BIOPSY  RIGHT BREAST RECONSTRUCTION WITH PLACEMENT OF TISSUE EXPANDER AND USE OF ACELLULAR DERMRAL MATRIX   Doing well without apparent complication Mild hypertension and tachycardia but has not taken her BP meds yet this morning. Probable discharge today per Dr. Harlow Mares   LOS: 2 days    Loretta Doutt T 11/27/2014

## 2014-11-27 NOTE — Discharge Instructions (Addendum)
No lifting for 6 weeks No vigorous activity for 6 weeks (including outdoor walks) No driving for 4 weeks OK to walk up stairs slowly Stay propped up Use incentive spirometer at home every hour while awake No shower while drains are in place Empty drains at least three times a day and record the amounts separately Change drain dressings every third day if instructed to do so by Dr. Harlow Mares  Apply Bacitracin antibiotic ointment to the drain sites  Place gauze dressing over drains  Secure the gauze with tape Take an over-the-counter Probiotic while on antibiotics Take an over-the-counter stool softener (such as Colace) while on pain medication Begin Lovenox injection (for blood clot prevention) at home this evening See Dr. Harlow Mares next week as scheduled For questions call [phone] or [phone]

## 2014-11-27 NOTE — Discharge Summary (Signed)
Physician Discharge Summary  Patient ID: Diana Deleon MRN: [id number] DOB/AGE: [id number] y.o.  Admit date: 11/25/2014 Discharge date: 11/27/2014  Admission Diagnoses:Right breast cancer  Discharge Diagnoses: Same Active Problems:   Breast cancer, right breast   Discharged Condition: good  Hospital Course: On the day of admission the patient was taken to surgery and had right mastectomy and reconstruction with tissue expander. The patient tolerated the procedures well. Postoperatively, the mastectomy flaps maintained excellent color and capillary refill. The patient was ambulatory and tolerating diet on the first postoperative day. DVT and antibiotic prophylaxis was continued.  Treatments: antibiotics: Ancef, anticoagulation: heparin and surgery: right mastectomy and sentinel node with reconstruction using tissue expander and acellular dermal matrix  Discharge Exam: Blood pressure 142/82, pulse 105, temperature 98.4 F (36.9 C), temperature source Oral, resp. rate 16, height 5\' 4"  (1.626 m), weight 214 lb 9.6 oz (97.342 kg), SpO2 90 %.  Operative sites: Mastectomy flaps viable. Tissue expander appears to be in good position. Drains functioning. Drainage thin. There is no evidence of bleeding, infection, or healing problem.  Disposition: Final discharge disposition not confirmed     Medication List    TAKE these medications        acetaminophen 500 MG tablet  Commonly known as:  TYLENOL  Take 1,000 mg by mouth every 6 (six) hours as needed.     cephALEXin 500 MG capsule  Commonly known as:  KEFLEX  Take 500 mg by mouth 4 (four) times daily.     cephALEXin 500 MG capsule  Commonly known as:  KEFLEX  Take 1 capsule (500 mg total) by mouth every 6 (six) hours.     DSS 100 MG Caps  Take 100 mg by mouth daily.     enoxaparin 40 MG/0.4ML injection  Commonly known as:  LOVENOX  Inject 0.4 mLs (40 mg total) into the skin daily.     HYDROmorphone 2 MG tablet  Commonly  known as:  DILAUDID  Take 1-2 tablets (2-4 mg total) by mouth every 4 (four) hours as needed for moderate pain.     levothyroxine 25 MCG tablet  Commonly known as:  SYNTHROID, LEVOTHROID  TAKE 1 TABLET BY MOUTH DAILY BEFORE BREAKFAST.     losartan-hydrochlorothiazide 50-12.5 MG per tablet  Commonly known as:  HYZAAR  Take 1 tablet by mouth daily.     methocarbamol 500 MG tablet  Commonly known as:  ROBAXIN  Take 1 tablet (500 mg total) by mouth 4 (four) times daily.     omeprazole 20 MG tablet  Commonly known as:  PRILOSEC OTC  Take 20 mg by mouth daily before breakfast.         Signed: Johnye Kist M 11/27/2014, 9:54 AM

## 2014-11-29 ENCOUNTER — Telehealth (INDEPENDENT_AMBULATORY_CARE_PROVIDER_SITE_OTHER): Payer: Self-pay | Admitting: General Surgery

## 2014-11-29 NOTE — Telephone Encounter (Signed)
Called and discussed path report

## 2014-12-06 ENCOUNTER — Ambulatory Visit: Payer: BC Managed Care – PPO | Admitting: Hematology and Oncology

## 2014-12-06 ENCOUNTER — Telehealth: Payer: Self-pay | Admitting: Hematology and Oncology

## 2014-12-06 ENCOUNTER — Encounter: Payer: Self-pay | Admitting: *Deleted

## 2014-12-06 ENCOUNTER — Other Ambulatory Visit: Payer: Self-pay | Admitting: *Deleted

## 2014-12-06 ENCOUNTER — Ambulatory Visit (HOSPITAL_BASED_OUTPATIENT_CLINIC_OR_DEPARTMENT_OTHER): Payer: BC Managed Care – PPO | Admitting: Hematology and Oncology

## 2014-12-06 VITALS — BP 115/72 | HR 75 | Temp 98.4°F | Resp 20 | Ht 64.0 in | Wt 210.9 lb

## 2014-12-06 DIAGNOSIS — Z170 Estrogen receptor positive status [ER+]: Secondary | ICD-10-CM

## 2014-12-06 DIAGNOSIS — C50411 Malignant neoplasm of upper-outer quadrant of right female breast: Secondary | ICD-10-CM

## 2014-12-06 NOTE — Progress Notes (Signed)
Patient Care Team: Abner Greenspan, MD as PCP - General Rulon Eisenmenger, MD as Consulting Physician (Hematology and Oncology) Excell Seltzer, MD as Consulting Physician (General Surgery) Eppie Gibson, MD as Attending Physician (Radiation Oncology) Trinda Pascal, NP as Nurse Practitioner (Nurse Practitioner)  DIAGNOSIS: Breast cancer of upper-outer quadrant of right female breast   Staging form: Breast, AJCC 7th Edition     Clinical: Stage IIA (T2, N0, M0) - Unsigned       Staging comments: Staged at breast conference on 11.11.15      Pathologic stage from 11/25/2014: Stage IIA (T2(2), N0, cM0) - Unsigned       Staging comments: Staged from final excisional pathology from Dr. Donato Heinz    SUMMARY OF ONCOLOGIC HISTORY:   Breast cancer of upper-outer quadrant of right female breast   10/20/2014 Initial Diagnosis Right breast invasive and in situ mammary cancer grade 2 E-cadherin positive, invasive ductal carcinoma with DCIS focal atypical cells negative for atypia and ER 70% PR 80% HER-2 negative ratio 1.26 Ki-67 30%   10/22/2014 Breast MRI Right breast upper outer quadrant 2.3 cm biopsy-proven malignancy   11/25/2014 Surgery Right simple mastectomy: Tumor #1 upper lateral invasive high-grade ductal carcinoma 2.8 cm plus high-grade DCIS plus ALS H and LCIS; tumor #2 upper medial DCIS 1.5 cm     CHIEF COMPLIANT: Follow-up after surgery   INTERVAL HISTORY: Diana Deleon is a 53 year old Caucasian lady with above-mentioned history of right-sided breast cancer she underwent right mastectomy that revealed invasive ductal carcinoma 2.87 m in size with a high-grade DCIS along with other abnormalities in the breast. She had a separate tumor nodule that was 1.5 cm and found to be DCIS. She is here today to discuss the final pathology report. She reports that she is recovering fairly well she is still very sore the drains had come out just recently. Denies any fevers or chills or nausea or vomiting.  Denies any bowel issues.  REVIEW OF SYSTEMS:   Constitutional: Denies fevers, chills or abnormal weight loss Eyes: Denies blurriness of vision Ears, nose, mouth, throat, and face: Denies mucositis or sore throat Respiratory: Denies cough, dyspnea or wheezes Cardiovascular: Denies palpitation, chest discomfort or lower extremity swelling Gastrointestinal:  Denies nausea, heartburn or change in bowel habits Skin: Denies abnormal skin rashes Lymphatics: Denies new lymphadenopathy or easy bruising Neurological:Denies numbness, tingling or new weaknesses Behavioral/Psych: Mood is stable, no new changes  Breast: Discomfort in the breast from recent surgery on the right side All other systems were reviewed with the patient and are negative.  I have reviewed the past medical history, past surgical history, social history and family history with the patient and they are unchanged from previous note.  ALLERGIES:  is allergic to norvasc.  MEDICATIONS:  Current Outpatient Prescriptions  Medication Sig Dispense Refill  . acetaminophen (TYLENOL) 500 MG tablet Take 1,000 mg by mouth every 6 (six) hours as needed.    . cephALEXin (KEFLEX) 500 MG capsule Take 1 capsule (500 mg total) by mouth every 6 (six) hours. 40 capsule 0  . docusate sodium 100 MG CAPS Take 100 mg by mouth daily. 10 capsule 0  . enoxaparin (LOVENOX) 40 MG/0.4ML injection Inject 0.4 mLs (40 mg total) into the skin daily. 12 Syringe 0  . HYDROmorphone (DILAUDID) 2 MG tablet Take 1-2 tablets (2-4 mg total) by mouth every 4 (four) hours as needed for moderate pain. 40 tablet 0  . levothyroxine (SYNTHROID, LEVOTHROID) 25 MCG tablet TAKE 1 TABLET  BY MOUTH DAILY BEFORE BREAKFAST. 90 tablet 3  . losartan-hydrochlorothiazide (HYZAAR) 50-12.5 MG per tablet Take 1 tablet by mouth daily. (Patient taking differently: Take 1 tablet by mouth daily before breakfast. ) 90 tablet 3  . methocarbamol (ROBAXIN) 500 MG tablet Take 1 tablet (500 mg total)  by mouth 4 (four) times daily. 40 tablet 1  . omeprazole (PRILOSEC OTC) 20 MG tablet Take 20 mg by mouth daily before breakfast.      No current facility-administered medications for this visit.    PHYSICAL EXAMINATION: ECOG PERFORMANCE STATUS: 1 - Symptomatic but completely ambulatory  Filed Vitals:   12/06/14 0931  BP: 115/72  Pulse: 75  Temp: 98.4 F (36.9 C)  Resp: 20   Filed Weights   12/06/14 0931  Weight: 210 lb 14.4 oz (95.664 kg)    GENERAL:alert, no distress and comfortable SKIN: skin color, texture, turgor are normal, no rashes or significant lesions EYES: normal, Conjunctiva are pink and non-injected, sclera clear OROPHARYNX:no exudate, no erythema and lips, buccal mucosa, and tongue normal  NECK: supple, thyroid normal size, non-tender, without nodularity LYMPH:  no palpable lymphadenopathy in the cervical, axillary or inguinal LUNGS: clear to auscultation and percussion with normal breathing effort HEART: regular rate & rhythm and no murmurs and no lower extremity edema ABDOMEN:abdomen soft, non-tender and normal bowel sounds Musculoskeletal:no cyanosis of digits and no clubbing  NEURO: alert & oriented x 3 with fluent speech, no focal motor/sensory deficits  LABORATORY DATA:  I have reviewed the data as listed   Chemistry      Component Value Date/Time   NA 138 11/23/2014 1541   NA 138 10/27/2014 1236   K 3.5* 11/23/2014 1541   K 3.8 10/27/2014 1236   CL 97 11/23/2014 1541   CO2 28 11/23/2014 1541   CO2 29 10/27/2014 1236   BUN 10 11/23/2014 1541   BUN 11.9 10/27/2014 1236   CREATININE 0.77 11/23/2014 1541   CREATININE 0.9 10/27/2014 1236      Component Value Date/Time   CALCIUM 9.5 11/23/2014 1541   CALCIUM 9.3 10/27/2014 1236   ALKPHOS 77 10/27/2014 1236   ALKPHOS 78 07/07/2013 0859   AST 18 10/27/2014 1236   AST 18 07/07/2013 0859   ALT 22 10/27/2014 1236   ALT 19 07/07/2013 0859   BILITOT 0.64 10/27/2014 1236   BILITOT 0.6 07/07/2013  0859       Lab Results  Component Value Date   WBC 7.9 11/23/2014   HGB 14.7 11/23/2014   HCT 42.0 11/23/2014   MCV 88.6 11/23/2014   PLT 254 11/23/2014   NEUTROABS 5.1 10/27/2014    ASSESSMENT & PLAN:  Breast cancer of upper-outer quadrant of right female breast Right breast invasive ductal carcinoma 2.8 cm status post mastectomy grade 3 with high-grade DCIS, a H and LCIS; separate tumor which was DCIS 1.5 cm, 1 SLN negative, ER 86%, PR 95%, HER-2 negative ratio 1.52, Ki-67 27% T2 N0 M0 stage II a  Pathology counseling: I reviewed the pathology report in great detail and provided her with a copy of this report. Recommendation: Oncotype DX testing to determine chemotherapy benefit. If she does not need chemotherapy, then she will go on antiestrogen therapy.  Return to clinic in 2-3 weeks for follow-up after Oncotype DX test results are available.   No orders of the defined types were placed in this encounter.   The patient has a good understanding of the overall plan. she agrees with it. She will call  with any problems that may develop before her next visit here.   Rulon Eisenmenger, MD 12/06/2014 9:53 AM

## 2014-12-06 NOTE — Assessment & Plan Note (Signed)
Right breast invasive ductal carcinoma 2.8 cm status post mastectomy grade 3 with high-grade DCIS, a H and LCIS; separate tumor which was DCIS 1.5 cm, 1 SLN negative, ER 86%, PR 95%, HER-2 negative ratio 1.52, Ki-67 27% T2 N0 M0 stage II a  Pathology counseling: I reviewed the pathology report in great detail and provided her with a copy of this report. Recommendation: Oncotype DX testing to determine chemotherapy benefit. If she does not need chemotherapy, then she will go on antiestrogen therapy.  Return to clinic in 2-3 weeks for follow-up after Oncotype DX test results are available.

## 2014-12-06 NOTE — Progress Notes (Signed)
Received order for oncotype testing from Dr. Lindi Adie. Requisition sent to pathology. Received by Tammy. PAC sent to [facility].

## 2014-12-06 NOTE — Telephone Encounter (Signed)
per pof to sch pt appt-gave pt copy of sch °

## 2014-12-15 ENCOUNTER — Encounter: Payer: Self-pay | Admitting: *Deleted

## 2014-12-15 ENCOUNTER — Encounter (HOSPITAL_COMMUNITY): Payer: Self-pay

## 2014-12-15 NOTE — Progress Notes (Signed)
Received Oncotype Dx results of 18.  Gave a copy to Dr. Lindi Adie and took a copy to HIM to scan.

## 2014-12-22 ENCOUNTER — Ambulatory Visit: Payer: 59 | Attending: General Surgery | Admitting: Physical Therapy

## 2014-12-22 DIAGNOSIS — Z5189 Encounter for other specified aftercare: Secondary | ICD-10-CM | POA: Insufficient documentation

## 2014-12-22 DIAGNOSIS — C50411 Malignant neoplasm of upper-outer quadrant of right female breast: Secondary | ICD-10-CM | POA: Diagnosis not present

## 2014-12-22 DIAGNOSIS — M25611 Stiffness of right shoulder, not elsewhere classified: Secondary | ICD-10-CM

## 2014-12-22 DIAGNOSIS — M25511 Pain in right shoulder: Secondary | ICD-10-CM

## 2014-12-22 NOTE — Patient Instructions (Signed)
Supine cane exercise

## 2014-12-22 NOTE — Therapy (Signed)
[place], [place], [address] Phone: [phone]   Fax:  [phone]  Physical Therapy Evaluation  Patient Details  Name: Diana Deleon MRN: [id number] Date of Birth: [birth year]  Encounter Date: 12/22/2014      PT End of Session - 12/22/14 1415    Visit Number 1   Number of Visits 9   Date for PT Re-Evaluation 01/19/15   PT Start Time 1300   PT Stop Time 1345   PT Time Calculation (min) 45 min   Activity Tolerance Patient tolerated treatment well   Behavior During Therapy [facility] for tasks assessed/performed      Past Medical History  Diagnosis Date  . History of recurrent UTIs   . History of abnormal Pap smear     has had leep in past  . History of cold sores     has used valtrex in past  . Seborrheic dermatitis     local on R scalp and in ear (failed mult meds- uses steroid spray)  . Intermittent vertigo   . Fibroids     hyst. 2006  . Hypertension   . Thyroid disease   . Hypothyroidism   . GERD (gastroesophageal reflux disease)   . Cyst of cystic duct     chest- central location, taking Cephalexin currently  . Kidney stones   . Anemia     hx  . Arthritis     "right toes" (11/25/2014)  . Breast cancer 2015    Past Surgical History  Procedure Laterality Date  . Tubal ligation    . Vaginal delivery      x3  . Mastectomy complete / simple w/ sentinel node biopsy Right 11/25/2014    sentinel  . Breast reconstruction with placement of tissue expander and flex hd (acellular hydrated dermis) Right 11/25/2014  . Cholecystectomy  2003  . Vaginal hysterectomy  2006    "fibroids"  . Breast biopsy Right 10/2014; 11/2014  . Lithotripsy  ~ 2007  . Simple mastectomy with axillary sentinel node biopsy Right 11/25/2014    Procedure: RIGHT TOTAL  MASTECTOMY WITH AXILLARY SENTINEL NODE BIOPSY ;  Surgeon: Excell Seltzer, MD;  Location: [facility];  Service: General;  Laterality: Right;  . Breast  reconstruction with placement of tissue expander and flex hd (acellular hydrated dermis) Right 11/25/2014    Procedure: RIGHT BREAST RECONSTRUCTION WITH PLACEMENT OF TISSUE EXPANDER AND USE OF ACELLULAR DERMRAL MATRIX ;  Surgeon: Crissie Reese, MD;  Location: [facility];  Service: Plastics;  Laterality: Right;    There were no vitals taken for this visit.  Visit Diagnosis:  Breast cancer of upper-outer quadrant of right female breast  Stiffness of joint, shoulder region, right  Pain in shoulder region, right      Subjective Assessment - 12/22/14 1307    Symptoms pain in right anterior and posterior axilla with decreasead range of motion of right shoulder after mastectomy with immediate expander placed on 11/25/2014   Pertinent History no plans for chemo or radiaton, unsure about hormone therapy.  Pt to have intermittent expander fills with saline implant in 3-5 months.   Patient Stated Goals wants to learn as much as she can to prevent lymphedema.  wants to imrove shoulder movement and return to work in a few weeks.   Currently in Pain? Yes   Pain Score 2    Pain Location Axilla  just above the top of expander.   Pain Orientation Anterior;Right   Pain Descriptors /  Indicators Sharp   Pain Type Acute pain   Aggravating Factors  movement and range of motion   Pain Relieving Factors rest          [facility] PT Assessment - 12/22/14 1324    Assessment   Medical Diagnosis right upper outer quadrant breast cancer   Onset Date 10/21/14   Precautions   Precautions Other (comment)  no massage   Restrictions   Weight Bearing Restrictions No   Other Position/Activity Restrictions limited activity per dr Harlow Mares   Balance Screen   Has the patient fallen in the past 6 months No   Has the patient had a decrease in activity level because of a fear of falling?  No   Is the patient reluctant to leave their home because of a fear of falling?  No   Home Environment   Living Enviornment Private  residence   Living Arrangements Spouse/significant other   Available Help at Discharge Available PRN/intermittently   Type of [place] Access Level entry   [place] seat   Prior Function   Level of Independence Independent with basic ADLs;Independent with homemaking with ambulation;Independent with gait;Independent with transfers   Vocation Full time employment  working part time at home   [employer] work    Leisure clean house    Cognition   Overall Cognitive Status Within [place] for tasks assessed   Observation/Other Assessments   Observations cyst on anterior chest. still wearing post op elastasized tube top. She reports this slides down and she has family pull iit up.  She noticed some swelling at posterior axialla lateral trunk last week that she felt might have been caused by tank top sliding down.  Swelling reduced when she pulled the top up. She feels more comfortable wearing the top and plans to wear a bra when she returns to work later this month.    Skin Integrity healing incision over right chest, 4x4 cm cyst on anterior chest that is raised with dark red perimeter over whitish center.  Full expander at right chest with skin fold and fleshy area above it. odor and whitish exudate at base of skin fold. pt to mention to Dr. Excell Seltzer today.   Posture/Postural Control   Posture/Postural Control No significant limitations   Posture Comments pt reports some core deconditioning since surgery    AROM   Right Shoulder Extension 55 Degrees   Right Shoulder Flexion 93 Degrees  pulling in anterior axilla   Right Shoulder ABduction 103 Degrees  easier if she bends her elbow   Right Shoulder Internal Rotation 65 Degrees   Right Shoulder External Rotation 87 Degrees   Strength   Overall Strength Deficits  decreased generalized strength from deconditioning            LYMPHEDEMA/ONCOLOGY QUESTIONNAIRE -  12/22/14 1309    Type   Cancer Type breast   Surgeries   Mastectomy Date 11/25/14   Sentinel Lymph Node Biopsy Date 11/25/14  2 removed    Other Surgery Date 11/25/14  expander placed. saline implant 3-5 month   Treatment   Active Chemotherapy Treatment No   Past Chemotherapy Treatment No   Active Radiation Treatment No   Past Radiation Treatment No   Current Hormone Treatment No   Past Hormone Therapy No   What other symptoms do you have   Are you Having Heaviness or Tightness Yes  right under arm and  anterior axilla area    Are you having Pain No   Are you having pitting edema No   Do you have infections No  4x4 cm longstandingcyst ant chest.white center/red perimeter           Katina Dung - 12/22/14 1408    Open a tight or new jar Mild difficulty   Do heavy household chores (wash walls, wash floors) Mild difficulty   Carry a shopping bag or briefcase No difficulty   Wash your back Mild difficulty   Use a knife to cut food No difficulty   Recreational activities in which you take some force or impact through your arm, shoulder, or hand (golf, hammering, tennis) Moderate difficulty   During the past week, to what extent has your arm, shoulder or hand problem interfered with your normal social activities with family, friends, neighbors, or groups? Slightly   During the past week, to what extent has your arm, shoulder or hand problem limited your work or other regular daily activities Slightly   Arm, shoulder, or hand pain. Mild   Tingling (pins and needles) in your arm, shoulder, or hand None   Difficulty Sleeping No difficulty   DASH Score 18.18 %            [facility] Adult PT Treatment/Exercise - 12/22/14 1324    Shoulder Exercises: Supine   Other Supine Exercises supine cane exercise with cues to keep pelvis stablaized                PT Education - 12/22/14 1414    Education provided Yes   Education Details supine cane exercise   Person(s) Educated  Patient   Methods Explanation;Demonstration   Comprehension Verbalized understanding;Returned demonstration           Short Term Clinic Goals - 12/22/14 1428    CC Short Term Goal  #1   Title short term goals= long term goals           Breast Clinic Goals - 10/27/14 1823    Patient will be able to verbalize understanding of pertinent lymphedema risk reduction practices relevant to her diagnosis specifically related to skin care.   Time 1   Period Days   Status Achieved   Patient will be able to return demonstrate and/or verbalize understanding of the post-op home exercise program related to regaining shoulder range of motion.   Time 1   Period Days   Status Achieved   Patient will be able to verbalize understanding of the importance of attending the postoperative After Breast Cancer Class for further lymphedema risk reduction education and therapeutic exercise.   Time 1   Period Days   Status Achieved          Long Term Clinic Goals - 12/22/14 1429    CC Long Term Goal  #1   Title verbalize understanding of lymphedema risk reduction practices   Time 4   Period Weeks   Status New   CC Long Term Goal  #2   Title increase right shoulder flexion to 135 degrees   Time 4   Period Weeks   Status New   CC Long Term Goal  #3   Title increase right shoulder abduction to 135    Time 4   Period Weeks   Status New   CC Long Term Goal  #4   Title independent in home exercise program   Time 4   Period Weeks   Status New  Plan - 12/22/14 1417    Clinical Impression Statement Patient now post op with expander placement and presents with decreased shoulder range of motion and core    PT Frequency 2x / week   PT Duration 4 weeks   PT Treatment/Interventions Therapeutic exercise;Patient/family education;Therapeutic activities   PT Next Visit Plan AROM to right shoulder, NO MASSAGE. Review dowel exercise and progress.  Emphasize core activation and stability    Recommended Other Services ABC Class.  Pt signed up to attend Jan 18   Consulted and Agree with Plan of Care Patient         Problem List Patient Active Problem List   Diagnosis Date Noted  . Breast cancer, right breast 11/25/2014  . Breast cancer of upper-outer quadrant of right female breast 10/25/2014  . Viral URI with cough 11/25/2013  . Hyperglycemia 07/14/2013  . Routine general medical examination at a health care facility 10/12/2012  . Subclinical hypothyroidism 06/03/2012  . Elevated transaminase level 06/03/2012  . Left foot pain 05/27/2012  . Overactive bladder 05/27/2012  . Dermatitis 05/27/2012  . COLD SORE 07/07/2010  . VERTIGO 08/01/2009  . URINARY URGENCY 07/15/2009  . LOW BACK PAIN, CHRONIC 05/17/2008  . HEADACHE, CHRONIC 05/17/2008  . Essential hypertension 02/12/2008  . DERMATITIS, SEBORRHEIC 02/12/2008   Donato Heinz. Owens Shark, PT   12/22/2014, 2:36 PM  [place], [place], [address] Phone: [phone]   Fax:  [phone]

## 2014-12-27 ENCOUNTER — Ambulatory Visit (HOSPITAL_BASED_OUTPATIENT_CLINIC_OR_DEPARTMENT_OTHER): Payer: 59 | Admitting: Hematology and Oncology

## 2014-12-27 ENCOUNTER — Telehealth: Payer: Self-pay | Admitting: Hematology and Oncology

## 2014-12-27 VITALS — BP 148/95 | HR 91 | Temp 98.4°F | Resp 20 | Wt 216.2 lb

## 2014-12-27 DIAGNOSIS — C50911 Malignant neoplasm of unspecified site of right female breast: Secondary | ICD-10-CM

## 2014-12-27 DIAGNOSIS — Z170 Estrogen receptor positive status [ER+]: Secondary | ICD-10-CM | POA: Diagnosis not present

## 2014-12-27 DIAGNOSIS — C50411 Malignant neoplasm of upper-outer quadrant of right female breast: Secondary | ICD-10-CM

## 2014-12-27 MED ORDER — TAMOXIFEN CITRATE 20 MG PO TABS: 20.0000 mg | ORAL_TABLET | Freq: Every day | ORAL | Status: DC

## 2014-12-27 NOTE — Telephone Encounter (Signed)
per pof to sch pt appt-gave pt copy of sch °

## 2014-12-27 NOTE — Progress Notes (Signed)
Patient Care Team: Abner Greenspan, MD as PCP - General Rulon Eisenmenger, MD as Consulting Physician (Hematology and Oncology) Excell Seltzer, MD as Consulting Physician (General Surgery) Eppie Gibson, MD as Attending Physician (Radiation Oncology) Trinda Pascal, NP as Nurse Practitioner (Nurse Practitioner)  DIAGNOSIS: Breast cancer of upper-outer quadrant of right female breast   Staging form: Breast, AJCC 7th Edition     Clinical: Stage IIA (T2, N0, M0) - Unsigned       Staging comments: Staged at breast conference on 11.11.15      Pathologic stage from 11/25/2014: Stage IIA (T2(2), N0, cM0) - Unsigned       Staging comments: Staged from final excisional pathology from Dr. Donato Heinz    SUMMARY OF ONCOLOGIC HISTORY:   Breast cancer of upper-outer quadrant of right female breast   10/20/2014 Initial Diagnosis Right breast invasive and in situ mammary cancer grade 2 E-cadherin positive, invasive ductal carcinoma with DCIS focal atypical cells negative for atypia and ER 70% PR 80% HER-2 negative ratio 1.26 Ki-67 30%   10/22/2014 Breast MRI Right breast upper outer quadrant 2.3 cm biopsy-proven malignancy   11/25/2014 Surgery Right simple mastectomy: Tumor #1 upper lateral invasive high-grade ductal carcinoma 2.8 cm plus high-grade DCIS plus ALS H and LCIS; tumor #2 upper medial DCIS 1.5 cm    11/25/2014 Oncotype testing 18 (11% 10-year risk of distant recurrence on Tamoxifen alone).     CHIEF COMPLIANT: Patient is here to discuss Oncotype DX test  INTERVAL HISTORY: Diana Deleon is a 54 year old lady with a history of right-sided breast cancer underwent a mastectomy. She had Oncotype DX testing and see her today to discuss the result. She is not having any problems from the recent surgery. Denies any new problems or concerns. She reports her insurance is changing and she does not have the new insurance cards.  REVIEW OF SYSTEMS:   Constitutional: Denies fevers, chills or abnormal weight  loss Eyes: Denies blurriness of vision Ears, nose, mouth, throat, and face: Denies mucositis or sore throat Respiratory: Denies cough, dyspnea or wheezes Cardiovascular: Denies palpitation, chest discomfort or lower extremity swelling Gastrointestinal:  Denies nausea, heartburn or change in bowel habits Skin: Denies abnormal skin rashes Lymphatics: Denies new lymphadenopathy or easy bruising Neurological:Denies numbness, tingling or new weaknesses Behavioral/Psych: Mood is stable, no new changes  Breast: Healing very well from surgery on the breast All other systems were reviewed with the patient and are negative.  I have reviewed the past medical history, past surgical history, social history and family history with the patient and they are unchanged from previous note.  ALLERGIES:  is allergic to norvasc.  MEDICATIONS:  Current Outpatient Prescriptions  Medication Sig Dispense Refill  . acetaminophen (TYLENOL) 500 MG tablet Take 1,000 mg by mouth every 6 (six) hours as needed.    . docusate sodium 100 MG CAPS Take 100 mg by mouth daily. 10 capsule 0  . levothyroxine (SYNTHROID, LEVOTHROID) 25 MCG tablet TAKE 1 TABLET BY MOUTH DAILY BEFORE BREAKFAST. 90 tablet 3  . losartan-hydrochlorothiazide (HYZAAR) 50-12.5 MG per tablet Take 1 tablet by mouth daily. (Patient taking differently: Take 1 tablet by mouth daily before breakfast. ) 90 tablet 3  . omeprazole (PRILOSEC OTC) 20 MG tablet Take 20 mg by mouth daily before breakfast.     . tamoxifen (NOLVADEX) 20 MG tablet Take 1 tablet (20 mg total) by mouth daily. 90 tablet 3   No current facility-administered medications for this visit.    PHYSICAL  EXAMINATION: ECOG PERFORMANCE STATUS: 0 - Asymptomatic  Filed Vitals:   12/27/14 0815  BP: 148/95  Pulse: 91  Temp: 98.4 F (36.9 C)  Resp: 20   Filed Weights   12/27/14 0815  Weight: 216 lb 3 oz (98.062 kg)    GENERAL:alert, no distress and comfortable SKIN: skin color, texture,  turgor are normal, no rashes or significant lesions EYES: normal, Conjunctiva are pink and non-injected, sclera clear OROPHARYNX:no exudate, no erythema and lips, buccal mucosa, and tongue normal  NECK: supple, thyroid normal size, non-tender, without nodularity LYMPH:  no palpable lymphadenopathy in the cervical, axillary or inguinal LUNGS: clear to auscultation and percussion with normal breathing effort HEART: regular rate & rhythm and no murmurs and no lower extremity edema ABDOMEN:abdomen soft, non-tender and normal bowel sounds Musculoskeletal:no cyanosis of digits and no clubbing  NEURO: alert & oriented x 3 with fluent speech, no focal motor/sensory deficits  LABORATORY DATA:  I have reviewed the data as listed   Chemistry      Component Value Date/Time   NA 138 11/23/2014 1541   NA 138 10/27/2014 1236   K 3.5* 11/23/2014 1541   K 3.8 10/27/2014 1236   CL 97 11/23/2014 1541   CO2 28 11/23/2014 1541   CO2 29 10/27/2014 1236   BUN 10 11/23/2014 1541   BUN 11.9 10/27/2014 1236   CREATININE 0.77 11/23/2014 1541   CREATININE 0.9 10/27/2014 1236      Component Value Date/Time   CALCIUM 9.5 11/23/2014 1541   CALCIUM 9.3 10/27/2014 1236   ALKPHOS 77 10/27/2014 1236   ALKPHOS 78 07/07/2013 0859   AST 18 10/27/2014 1236   AST 18 07/07/2013 0859   ALT 22 10/27/2014 1236   ALT 19 07/07/2013 0859   BILITOT 0.64 10/27/2014 1236   BILITOT 0.6 07/07/2013 0859       Lab Results  Component Value Date   WBC 7.9 11/23/2014   HGB 14.7 11/23/2014   HCT 42.0 11/23/2014   MCV 88.6 11/23/2014   PLT 254 11/23/2014   NEUTROABS 5.1 10/27/2014   ASSESSMENT & PLAN:  Breast cancer of upper-outer quadrant of right female breast Right breast invasive ductal carcinoma 2.8 cm status post mastectomy grade 3 with high-grade DCIS, ALH and LCIS; separate tumor which was DCIS 1.5 cm, 1 SLN negative, ER 86%, PR 95%, HER-2 negative ratio 1.52, Ki-67 27% T2 N0 M0 stage II a, Oncotype Dx score 18  (11% ROR) Low risk  Oncotype DX counseling: Discussed Oncotype DX score being low risk, she does not need systemic chemotherapy. Since her mastectomy she would not need radiation.  Recommendation: Adjuvant antiestrogen therapy with tamoxifen X 10 yrs  Tamoxifen counseling: We discussed the risks and benefits of tamoxifen. These include but not limited to insomnia, hot flashes, mood changes, vaginal dryness, and weight gain. Although rare, serious side effects including endometrial cancer, risk of blood clots were also discussed. We strongly believe that the benefits far outweigh the risks. Patient understands these risks and consented to starting treatment. Planned treatment duration is 10 years. I discussed the improvement of relapse free survival at 10 years of tamoxifen versus 5 years. The patient has been sent and patient will start treatment soon.  Return to clinic in 3 months for follow-up    No orders of the defined types were placed in this encounter.   The patient has a good understanding of the overall plan. she agrees with it. She will call with any problems that may develop  before her next visit here.   Rulon Eisenmenger, MD 12/27/2014 8:51 AM

## 2014-12-27 NOTE — Assessment & Plan Note (Signed)
Right breast invasive ductal carcinoma 2.8 cm status post mastectomy grade 3 with high-grade DCIS, ALH and LCIS; separate tumor which was DCIS 1.5 cm, 1 SLN negative, ER 86%, PR 95%, HER-2 negative ratio 1.52, Ki-67 27% T2 N0 M0 stage II a, Oncotype Dx score 18 (11% ROR) Low risk  Oncotype DX counseling: Discussed Oncotype DX score being low risk, she does not need systemic chemotherapy. Since her mastectomy she would not need radiation. Recommendation: Adjuvant antiestrogen therapy with tamoxifen  Tamoxifen counseling: We discussed the risks and benefits of tamoxifen. These include but not limited to insomnia, hot flashes, mood changes, vaginal dryness, and weight gain. Although rare, serious side effects including endometrial cancer, risk of blood clots were also discussed. We strongly believe that the benefits far outweigh the risks. Patient understands these risks and consented to starting treatment. Planned treatment duration is 10 years. I discussed the improvement of relapse free survival at 10 years of tamoxifen versus 5 years. The patient has been sent and patient will start treatment soon.  Return to clinic in 3 months for follow-up

## 2014-12-28 ENCOUNTER — Ambulatory Visit: Payer: 59

## 2014-12-28 DIAGNOSIS — Z5189 Encounter for other specified aftercare: Secondary | ICD-10-CM | POA: Diagnosis not present

## 2014-12-28 DIAGNOSIS — M25611 Stiffness of right shoulder, not elsewhere classified: Secondary | ICD-10-CM

## 2014-12-28 DIAGNOSIS — M25511 Pain in right shoulder: Secondary | ICD-10-CM

## 2014-12-28 DIAGNOSIS — C50411 Malignant neoplasm of upper-outer quadrant of right female breast: Secondary | ICD-10-CM

## 2014-12-28 NOTE — Therapy (Signed)
[place], [place], [address] Phone: [phone]   Fax:  [phone]  Physical Therapy Treatment  Patient Details  Name: Diana Deleon MRN: [id number] Date of Birth: 1961-01-05 Referring Provider:  Abner Greenspan, MD  Encounter Date: 12/28/2014      PT End of Session - 12/28/14 0919    Visit Number 2   Number of Visits 9   Date for PT Re-Evaluation 01/19/15   PT Start Time 0848   PT Stop Time 0930   PT Time Calculation (min) 42 min      Past Medical History  Diagnosis Date  . History of recurrent UTIs   . History of abnormal Pap smear     has had leep in past  . History of cold sores     has used valtrex in past  . Seborrheic dermatitis     local on R scalp and in ear (failed mult meds- uses steroid spray)  . Intermittent vertigo   . Fibroids     hyst. 2006  . Hypertension   . Thyroid disease   . Hypothyroidism   . GERD (gastroesophageal reflux disease)   . Cyst of cystic duct     chest- central location, taking Cephalexin currently  . Kidney stones   . Anemia     hx  . Arthritis     "right toes" (11/25/2014)  . Breast cancer 2015    Past Surgical History  Procedure Laterality Date  . Tubal ligation    . Vaginal delivery      x3  . Mastectomy complete / simple w/ sentinel node biopsy Right 11/25/2014    sentinel  . Breast reconstruction with placement of tissue expander and flex hd (acellular hydrated dermis) Right 11/25/2014  . Cholecystectomy  2003  . Vaginal hysterectomy  2006    "fibroids"  . Breast biopsy Right 10/2014; 11/2014  . Lithotripsy  ~ 2007  . Simple mastectomy with axillary sentinel node biopsy Right 11/25/2014    Procedure: RIGHT TOTAL  MASTECTOMY WITH AXILLARY SENTINEL NODE BIOPSY ;  Surgeon: Excell Seltzer, MD;  Location: [facility];  Service: General;  Laterality: Right;  . Breast reconstruction with placement of tissue expander and flex hd (acellular  hydrated dermis) Right 11/25/2014    Procedure: RIGHT BREAST RECONSTRUCTION WITH PLACEMENT OF TISSUE EXPANDER AND USE OF ACELLULAR DERMRAL MATRIX ;  Surgeon: Crissie Reese, MD;  Location: [facility];  Service: Plastics;  Laterality: Right;    There were no vitals taken for this visit.  Visit Diagnosis:  Breast cancer of upper-outer quadrant of right female breast  Stiffness of joint, shoulder region, right  Pain in shoulder region, right  Carcinoma of upper-outer quadrant of right female breast      Subjective Assessment - 12/28/14 0856    Symptoms The stretch Helene Kelp showed me lsat time has really helped, already feel like my ROM is better.                    [facility] Adult PT Treatment/Exercise - 12/28/14 0001    Shoulder Exercises: Supine   Horizontal ABduction AROM;Both;10 reps  On towel roll   Other Supine Exercises Scaption "V" also on towel roll 10 reps   Shoulder Exercises: Pulleys   Flexion 2 minutes   Flexion Limitations Some discomfort reported at expander, but minimal.   ABduction 2 minutes   Shoulder Exercises: Therapy Ball   Flexion 10 reps  Roll yellow ball up  wall   Shoulder Exercises: ROM/Strengthening   Other ROM/Strengthening Exercises Finger Ladder Rt UE abduction 8 reps   Other ROM/Strengthening Exercises Reviewed cane exercises Flexion with slight scaption to alleviate pain at pectoralis insertion and abduction 10 reps each.   Shoulder Exercises: Stretch   External Rotation Stretch 5 reps;10 seconds  In door frame, Rt UE   Other Shoulder Stretches Scapular Retraction 15 reps   Manual Therapy   Passive ROM In supine to Rt shoulder into flexion, abduction, and D2 to pts tolerance only.                   Short Term Clinic Goals - 12/22/14 1428    CC Short Term Goal  #1   Title short term goals= long term goals           Breast Clinic Goals - 10/27/14 1823    Patient will be able to verbalize understanding of pertinent lymphedema  risk reduction practices relevant to her diagnosis specifically related to skin care.   Time 1   Period Days   Status Achieved   Patient will be able to return demonstrate and/or verbalize understanding of the post-op home exercise program related to regaining shoulder range of motion.   Time 1   Period Days   Status Achieved   Patient will be able to verbalize understanding of the importance of attending the postoperative After Breast Cancer Class for further lymphedema risk reduction education and therapeutic exercise.   Time 1   Period Days   Status Achieved          Long Term Clinic Goals - 12/22/14 1429    CC Long Term Goal  #1   Title verbalize understanding of lymphedema risk reduction practices   Time 4   Period Weeks   Status New   CC Long Term Goal  #2   Title increase right shoulder flexion to 135 degrees   Time 4   Period Weeks   Status New   CC Long Term Goal  #3   Title increase right shoulder abduction to 135    Time 4   Period Weeks   Status New   CC Long Term Goal  #4   Title independent in home exercise program   Time 4   Period Weeks   Status New            Plan - 12/28/14 0920    Clinical Impression Statement Pt doing well with stretching at home and also uses good technique, not pushing into pain but only stretch.    Pt will benefit from skilled therapeutic intervention in order to improve on the following deficits Decreased range of motion;Increased edema;Decreased knowledge of precautions;Impaired UE functional use   Rehab Potential Excellent   Clinical Impairments Affecting Rehab Potential none   PT Frequency 2x / week   PT Duration 4 weeks   PT Treatment/Interventions Therapeutic exercise;Patient/family education;Therapeutic activities   PT Next Visit Plan AROM to right shoulder, NO MASSAGE. Review dowel exercise and progress.  Emphasize core activation and stability        Problem List Patient Active Problem List   Diagnosis Date  Noted  . Breast cancer, right breast 11/25/2014  . Breast cancer of upper-outer quadrant of right female breast 10/25/2014  . Viral URI with cough 11/25/2013  . Hyperglycemia 07/14/2013  . Routine general medical examination at a health care facility 10/12/2012  . Subclinical hypothyroidism 06/03/2012  . Elevated transaminase level 06/03/2012  .  Left foot pain 05/27/2012  . Overactive bladder 05/27/2012  . Dermatitis 05/27/2012  . COLD SORE 07/07/2010  . VERTIGO 08/01/2009  . URINARY URGENCY 07/15/2009  . LOW BACK PAIN, CHRONIC 05/17/2008  . HEADACHE, CHRONIC 05/17/2008  . Essential hypertension 02/12/2008  . DERMATITIS, SEBORRHEIC 02/12/2008    Otelia Limes, PTA 12/28/2014, 9:31 AM  [place], [place], [address] Phone: [phone]   Fax:  [phone]

## 2014-12-31 ENCOUNTER — Ambulatory Visit: Payer: 59 | Admitting: Physical Therapy

## 2014-12-31 ENCOUNTER — Telehealth: Payer: Self-pay | Admitting: Adult Health

## 2014-12-31 DIAGNOSIS — M25511 Pain in right shoulder: Secondary | ICD-10-CM

## 2014-12-31 DIAGNOSIS — C50411 Malignant neoplasm of upper-outer quadrant of right female breast: Secondary | ICD-10-CM

## 2014-12-31 DIAGNOSIS — M25611 Stiffness of right shoulder, not elsewhere classified: Secondary | ICD-10-CM

## 2014-12-31 DIAGNOSIS — Z5189 Encounter for other specified aftercare: Secondary | ICD-10-CM | POA: Diagnosis not present

## 2014-12-31 NOTE — Therapy (Signed)
[place], [place], [address] Phone: [phone]   Fax:  [phone]  Physical Therapy Treatment  Patient Details  Name: Diana Deleon MRN: [id number] Date of Birth: 17-Aug-1961 Referring Provider:  Abner Greenspan, MD  Encounter Date: 12/31/2014      PT End of Session - 12/31/14 0941    Visit Number 3   Number of Visits 9   Date for PT Re-Evaluation 01/19/15   PT Start Time 0852   PT Stop Time 0935   PT Time Calculation (min) 43 min      Past Medical History  Diagnosis Date  . History of recurrent UTIs   . History of abnormal Pap smear     has had leep in past  . History of cold sores     has used valtrex in past  . Seborrheic dermatitis     local on R scalp and in ear (failed mult meds- uses steroid spray)  . Intermittent vertigo   . Fibroids     hyst. 2006  . Hypertension   . Thyroid disease   . Hypothyroidism   . GERD (gastroesophageal reflux disease)   . Cyst of cystic duct     chest- central location, taking Cephalexin currently  . Kidney stones   . Anemia     hx  . Arthritis     "right toes" (11/25/2014)  . Breast cancer 2015    Past Surgical History  Procedure Laterality Date  . Tubal ligation    . Vaginal delivery      x3  . Mastectomy complete / simple w/ sentinel node biopsy Right 11/25/2014    sentinel  . Breast reconstruction with placement of tissue expander and flex hd (acellular hydrated dermis) Right 11/25/2014  . Cholecystectomy  2003  . Vaginal hysterectomy  2006    "fibroids"  . Breast biopsy Right 10/2014; 11/2014  . Lithotripsy  ~ 2007  . Simple mastectomy with axillary sentinel node biopsy Right 11/25/2014    Procedure: RIGHT TOTAL  MASTECTOMY WITH AXILLARY SENTINEL NODE BIOPSY ;  Surgeon: Excell Seltzer, MD;  Location: [facility];  Service: General;  Laterality: Right;  . Breast reconstruction with placement of tissue expander and flex hd (acellular  hydrated dermis) Right 11/25/2014    Procedure: RIGHT BREAST RECONSTRUCTION WITH PLACEMENT OF TISSUE EXPANDER AND USE OF ACELLULAR DERMRAL MATRIX ;  Surgeon: Crissie Reese, MD;  Location: [facility];  Service: Plastics;  Laterality: Right;    There were no vitals taken for this visit.  Visit Diagnosis:  Breast cancer of upper-outer quadrant of right female breast  Stiffness of joint, shoulder region, right  Pain in shoulder region, right      Subjective Assessment - 12/31/14 0856    Symptoms "my shoulder's a little sore--feels like the bone is sore"   Currently in Pain? Yes   Pain Score 3    Pain Location Shoulder   Pain Orientation Right   Pain Descriptors / Indicators Sore   Aggravating Factors  unsure; moving it certain movements   Pain Relieving Factors at rest   Multiple Pain Sites No          [facility] PT Assessment - 12/31/14 0001    AROM   Right Shoulder Flexion 130 Degrees   Right Shoulder ABduction 170 Degrees                  [facility] Adult PT Treatment/Exercise - 12/31/14 0001    Shoulder  Exercises: Supine   Horizontal ABduction AROM  on towel roll, once   Other Supine Exercises scaption "V" x 10 reps to stretch   Shoulder Exercises: Standing   Other Standing Exercises Finger ladder right UE abduction x5   Shoulder Exercises: Pulleys   Flexion 2 minutes   ABduction 2 minutes   Shoulder Exercises: Therapy Ball   Flexion 10 reps  yellow ball up wall   Shoulder Exercises: Stretch   Corner Stretch 5 reps;10 seconds  done in doorway, not corner   External Rotation Stretch 5 reps;10 seconds   Manual Therapy   Manual Therapy Myofascial release   Myofascial Release Right UE myofascial pulling.   Passive ROM In supine, right shoulder IR, ER, abduction, and flexion stretches to patient tolerance.                   Short Term Clinic Goals - 12/22/14 1428    CC Short Term Goal  #1   Title short term goals= long term goals           Breast  Clinic Goals - 10/27/14 1823    Patient will be able to verbalize understanding of pertinent lymphedema risk reduction practices relevant to her diagnosis specifically related to skin care.   Time 1   Period Days   Status Achieved   Patient will be able to return demonstrate and/or verbalize understanding of the post-op home exercise program related to regaining shoulder range of motion.   Time 1   Period Days   Status Achieved   Patient will be able to verbalize understanding of the importance of attending the postoperative After Breast Cancer Class for further lymphedema risk reduction education and therapeutic exercise.   Time 1   Period Days   Status Achieved          Long Term Clinic Goals - 12/31/14 0909    CC Long Term Goal  #1   Status On-going   CC Long Term Goal  #2   Baseline 130 on 12/31/14   Status On-going   CC Long Term Goal  #3   Status Achieved   CC Long Term Goal  #4   Status On-going            Plan - 12/31/14 0941    Pt will benefit from skilled therapeutic intervention in order to improve on the following deficits Decreased range of motion;Decreased knowledge of precautions;Impaired UE functional use   Rehab Potential Excellent   PT Frequency 2x / week   PT Duration 4 weeks   PT Treatment/Interventions Therapeutic exercise;Manual techniques;Patient/family education   PT Next Visit Plan P/AA/AROM right shoulder.  Patient hopes to come to ABC class, but if she's not able, begin instruction in lymphedema risk reduction practices.   Consulted and Agree with Plan of Care Patient        Problem List Patient Active Problem List   Diagnosis Date Noted  . Breast cancer, right breast 11/25/2014  . Breast cancer of upper-outer quadrant of right female breast 10/25/2014  . Viral URI with cough 11/25/2013  . Hyperglycemia 07/14/2013  . Routine general medical examination at a health care facility 10/12/2012  . Subclinical hypothyroidism 06/03/2012  .  Elevated transaminase level 06/03/2012  . Left foot pain 05/27/2012  . Overactive bladder 05/27/2012  . Dermatitis 05/27/2012  . COLD SORE 07/07/2010  . VERTIGO 08/01/2009  . URINARY URGENCY 07/15/2009  . LOW BACK PAIN, CHRONIC 05/17/2008  . HEADACHE, CHRONIC 05/17/2008  .  Essential hypertension 02/12/2008  . DERMATITIS, SEBORRHEIC 02/12/2008    SALISBURY,DONNA 12/31/2014, 9:46 AM  [place], [place], [address] Phone: [phone]   Fax:  [phone]  Serafina Royals, [place]

## 2014-12-31 NOTE — Telephone Encounter (Signed)
I spoke with Diana Deleon and we scheduled her [facility]orship [facility] appointment for Monday, 01/03/2015 at 1:30pm.  Patient verbalized understanding of this new appointment and where she should check in.   Mike Craze, NP [place] [phone]

## 2015-01-03 ENCOUNTER — Ambulatory Visit (HOSPITAL_BASED_OUTPATIENT_CLINIC_OR_DEPARTMENT_OTHER): Payer: 59 | Admitting: Adult Health

## 2015-01-03 ENCOUNTER — Encounter: Payer: Self-pay | Admitting: Adult Health

## 2015-01-03 ENCOUNTER — Ambulatory Visit: Payer: 59 | Admitting: Physical Therapy

## 2015-01-03 ENCOUNTER — Telehealth: Payer: Self-pay | Admitting: Adult Health

## 2015-01-03 VITALS — BP 151/94 | HR 79 | Temp 97.8°F | Resp 16

## 2015-01-03 DIAGNOSIS — C50411 Malignant neoplasm of upper-outer quadrant of right female breast: Secondary | ICD-10-CM

## 2015-01-03 DIAGNOSIS — N951 Menopausal and female climacteric states: Secondary | ICD-10-CM

## 2015-01-03 DIAGNOSIS — Z79810 Long term (current) use of selective estrogen receptor modulators (SERMs): Secondary | ICD-10-CM

## 2015-01-03 DIAGNOSIS — C50911 Malignant neoplasm of unspecified site of right female breast: Secondary | ICD-10-CM

## 2015-01-03 NOTE — Telephone Encounter (Signed)
Diana Deleon expressed concern during her office visit with with me today that she was not able to pick up a 90-day supply of her Tamoxifen from her retail pharmacy ([facility]).  I called the pharmacy to inquire about this on behalf of the patient.  The pharmacist let me know that Diana Deleon's insurance only allows a 30-day supply of the medication to be dispensed.  I called Diana Deleon to give her an update per her request.  She was encouraged to call with further questions or concerns.   Mike Craze, NP [place] [phone]

## 2015-01-03 NOTE — Progress Notes (Signed)
CLINIC:  Cancer Survivorship   REASON FOR VISIT:  Routine follow-up post-treatment for a recent history of breast cancer.  HISTORY OF PRESENT ILLNESS:  Diana Deleon is a very pleasant 54 y.o. female with a history of invasive ductal carcinoma with foci of ductal carcinoma in situ of the right breast.  Her oncologic history dates back to November 2015 when she underwent a biopsy of a right breast mass which revealed right breast invasive and in situ mammary cancer, grade 2, E-cadherin positive, ER positive, PR positive, and HER-2 not amplified, with a Ki67 of 27%.  Upon her diagnosis of breast cancer, Diana Deleon presented to [facility] for treatment options discussion and recommendations.  She underwent right mastectomy with sentinel lymph node biopsy on 11/25/2014 with surgical pathology revealing high grade IDC, with high-grade DCIS, as well as ALH and LCIS.  Surgical margins were negative.  Per the recommendations of Dr. Lindi Adie, she began anti-estrogen therapy with Tamoxifen on 01/03/2015.  Her planned duration of anti-estrogen therapy is 10 years.  Diana Deleon presents to the [facility] today for our initial meeting to review her survivorship care plan detailing her treatment course for breast cancer, as well as monitoring long-term side effects of that treatment, education regarding health maintenance, screening, and overall wellness and health promotion.     Overall, Diana Deleon reports feeling quite well since completing her breast surgery.  She is currently in physical therapy for right shoulder range of motion.  She reports minimal pain since her surgery, with the most bothersome complaint being "soreness" to her right axilla for which she has not required any pain medications.  She states that PT is helping improve this discomfort.  She states that she has been coping quite well and states, "It's hard to believe that I am already finished with  my cancer treatment."  She is working with her Psychiatric nurse, Dr. Harlow Mares, for her reconstruction of the right breast and saw him today for an "expansion" visit and reports being a little sore from that treatment.  She started the Tamoxifen today, but states that she has been having some "very mild" hot flashes before beginning the Tamoxifen and is concerned if the hot flashes will get worse with this new medication.  She also is seeing Dr. Harlow Mares later this week to have a cyst on her chest excised that has been painful and concerning her.   REVIEW OF SYSTEMS:  General: Denies fever, chills, unintentional weight loss, or generalized fatigue.  Cardiac: Denies palpitations, chest pain, and lower extremity edema.  Respiratory: Denies cough, shortness of breath, and dyspnea on exertion.  GI: Denies abdominal pain, constipation, diarrhea, nausea, or vomiting.  GU: Denies dysuria, hematuria, vaginal bleeding, vaginal discharge, or vaginal dryness.  Musculoskeletal: Denies joint or bone pain.  Neuro: Denies headache or recent falls. Denies peripheral neuropathy. Skin: Denies rash or pruritis.   Breast: Denies any new nodularity, masses, tenderness, nipple changes, or nipple discharge.  Psych: Denies depression, anxiety, insomnia, or memory loss.   A 14-point review of systems was completed and was negative, except as noted above.  BRIEF ONCOLOGIC HISTORY:    Breast cancer of upper-outer quadrant of right female breast   10/20/2014 Initial Diagnosis Right breast invasive and in situ mammary cancer grade 2 E-cadherin positive, invasive ductal carcinoma with DCIS focal atypical cells negative for atypia and ER 70% PR 80% HER-2 negative ratio 1.26 Ki-67 30%   10/22/2014 Breast MRI Right breast upper outer  quadrant 2.3 cm biopsy-proven malignancy   11/25/2014 Surgery Right simple mastectomy: Tumor #1 upper lateral invasive high-grade ductal carcinoma 2.8 cm plus high-grade DCIS plus ALH and LCIS; tumor #2  upper medial DCIS 1.5 cm. Margins negative.   11/25/2014 Oncotype testing 18 (11% 10-year risk of distant recurrence on Tamoxifen alone).    12/27/2014 -  Anti-estrogen oral therapy Tamoxifen started. Planned duration of treatment is 10 years (end date 12/2024)   1. Diagnosis  date: 10/20/2014 2. Reconstruction?: Yes, tissue expanders placed at time of mastectomy.  ONCOLOGY TREATMENT TEAM:  1. Surgeon:  Dr. Excell Seltzer at [facility] 2. Plastic Surgeon: Dr. Harlow Mares 3. Medical Oncologist: Dr. Lindi Adie     PAST MEDICAL/SURGICAL HISTORY:  Past Medical History  Diagnosis Date  . History of recurrent UTIs   . History of abnormal Pap smear     has had leep in past  . History of cold sores     has used valtrex in past  . Seborrheic dermatitis     local on R scalp and in ear (failed mult meds- uses steroid spray)  . Intermittent vertigo   . Fibroids     hyst. 2006  . Hypertension   . Thyroid disease   . Hypothyroidism   . GERD (gastroesophageal reflux disease)   . Cyst of cystic duct     chest- central location, taking Cephalexin currently  . Kidney stones   . Anemia     hx  . Arthritis     "right toes" (11/25/2014)  . Breast cancer 2015   Past Surgical History  Procedure Laterality Date  . Tubal ligation    . Vaginal delivery      x3  . Mastectomy complete / simple w/ sentinel node biopsy Right 11/25/2014    sentinel  . Breast reconstruction with placement of tissue expander and flex hd (acellular hydrated dermis) Right 11/25/2014  . Cholecystectomy  2003  . Vaginal hysterectomy  2006    "fibroids"  . Breast biopsy Right 10/2014; 11/2014  . Lithotripsy  ~ 2007  . Simple mastectomy with axillary sentinel node biopsy Right 11/25/2014    Procedure: RIGHT TOTAL  MASTECTOMY WITH AXILLARY SENTINEL NODE BIOPSY ;  Surgeon: Excell Seltzer, MD;  Location: [place];  Service: General;  Laterality: Right;  . Breast reconstruction with placement of tissue expander and flex hd  (acellular hydrated dermis) Right 11/25/2014    Procedure: RIGHT BREAST RECONSTRUCTION WITH PLACEMENT OF TISSUE EXPANDER AND USE OF ACELLULAR DERMRAL MATRIX ;  Surgeon: Crissie Reese, MD;  Location: [place];  Service: Plastics;  Laterality: Right;     HEALTH MAINTENANCE/WELLNESS HISTORY:  1. Last colonoscopy: 2005 and was normal per patient.  Recommendation for repeat testing in 10 years. Patient will coordinate with PCP.  2. Last pap smear: 07/09/2013 per Dr. Julien Girt, OB/GYN 3. Flu vaccine given: 09/29/2014 4. Last DEXA scan: Never had DEXA scan per patient.    CURRENT MEDICATIONS:  Outpatient Encounter Prescriptions as of 01/03/2015  Medication Sig  . acetaminophen (TYLENOL) 500 MG tablet Take 1,000 mg by mouth every 6 (six) hours as needed.  . docusate sodium 100 MG CAPS Take 100 mg by mouth daily.  [name] Kitchen levothyroxine (SYNTHROID, LEVOTHROID) 25 MCG tablet TAKE 1 TABLET BY MOUTH DAILY BEFORE BREAKFAST.  [name] Kitchen losartan-hydrochlorothiazide (HYZAAR) 50-12.5 MG per tablet Take 1 tablet by mouth daily. (Patient taking differently: Take 1 tablet by mouth daily before breakfast. )  . omeprazole (PRILOSEC OTC) 20 MG tablet Take 20 mg by  mouth daily before breakfast.   . tamoxifen (NOLVADEX) 20 MG tablet Take 1 tablet (20 mg total) by mouth daily.     ONCOLOGIC FAMILY HISTORY:  None    SOCIAL HISTORY:  Edee Nifong is married and lives with her husband in [place], [place].   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  She denies any current or history of tobacco, alcohol, or illicit drug use.     PHYSICAL EXAMINATION:  Vital Signs:   Filed Vitals:   01/03/15 1400  BP: 151/94  Pulse: 79  Temp: 97.8 F (36.6 C)  Resp: 16   General: Well-nourished, well-appearing female in no acute distress.  She is accompanied in clinic by her mother today.   HEENT: Head is atraumatic and normocephalic.  Pupils equal and reactive to light and  accomodation. Conjunctivae clear without exudate.  Sclerae anicteric. Oral mucosa is pink, moist, and intact without lesions.  Oropharynx is pink without lesions or erythema.  Lymph: No cervical, supraclavicular, infraclavicular, or axillary lymphadenopathy noted on palpation.  Cardiovascular: Regular rate and rhythm without murmurs, rubs, or gallops. Respiratory: Clear to auscultation bilaterally. Chest expansion symmetric without accessory muscle use on inspiration or expiration.  Breast: Exam deferred today as patient saw her Plastic Surgeon today for an "expansion" and is sore.   GI: Abdomen soft and round. No tenderness to palpation. Bowel sounds normoactive in 4 quadrants. No hepatosplenomegaly.   GU: Deferred.  Musculoskeletal: Muscle strength 5/5 in all extremities. No tenderness to palpation to spine, scapula, or clavicles. Full ROM noted in all extremities.  Neuro: No focal deficits. Steady gait.  Psych: Mood and affect normal and appropriate for situation.  Extremities: No edema, cyanosis, or clubbing.  Skin: Warm and dry. No open lesions noted.   LABORATORY DATA:  None for this visit.   DIAGNOSTIC IMAGING:  None for this visit.    ASSESSMENT AND PLAN:  1. History of breast cancer:  Diana Deleon will follow-up with her medical oncologist, Dr. Lindi Adie in his clinic in April 2016 with history and physical exam per surveillance protocol.  She will continue her endocrine therapy with Tamoxifen as prescribed by Dr. Lindi Adie. She was instructed to make Dr. Lindi Adie or myself aware if she begins to experience any side effects of the medication and I could see her back in clinic to help manage those side effects, as needed. Side effects of Tamoxifen were  reviewed with her as well. A comprehensive survivorship care plan and treatment summary was reviewed with the patient today detailing her breast cancer diagnosis, treatment course, potential late/long-term effects of treatment, appropriate  follow-up care with recommendations for the future, and patient education resources.  A copy of this summary, along with a letter will be sent to the patient's primary care provider, will be mailed/routed after today's visit.  Diana Deleon is welcome to return to the Survivorship Clinic in the future, as needed; no follow-up will be scheduled at this time.    2. Mild hot flashes:  Diana Deleon is likely going through natural menopause. She has not had a menstrual cycle since her hysterectomy in 2006.  She expresses what appears to be mild hot flashes, not consistently but worse at night.  She was encouraged to avoid common triggers for hot flashes including hot/spicy foods, caffeine, and alcohol.  She was also encouraged to keep her bedroom cool and to dress in layers.  Diana Deleon was encouraged to  contact me or Dr. Lindi Adie if her complaints of hot flashes worsens with the addition of Tamoxifen to her medication regimen.    3. Bone health:  Given Diana Deleon's history of breast cancer and reported family history of osteoporosis, she is at risk for bone demineralization.  Tamoxifen has been shown to actually offer some improvement in bone demineralization, which will be important to monitor over time. Per our records and per patient's report, she has never received a DEXA scan.  It was recommended that she receive a DEXA scan in about 1 year after initiating Tamoxifen, in order to receive a baseline assessment of her bone density.   In addition, she was encouraged to increase her consumption of foods rich in calcium, as well as increase her weight-bearing activities.  She was given education on specific activities to promote bone health.  4. Cancer screening:  Due to Diana Deleon's history and her age, she should receive screening for skin cancers, colon cancer, and gynecologic cancers.  The information and recommendations are listed on the patient's comprehensive care plan/treatment summary and were reviewed in detail  with the patient.    5. Health maintenance and wellness promotion: Diana Deleon was encouraged to consume 5-7 servings of fruits and vegetables per day. She was also encouraged to engage in moderate to vigorous exercise for 30 minutes per day most days of the week. She was instructed to limit her alcohol consumption and continue to abstain from tobacco use/was encouraged stop smoking.  Her vaccinations are currently up-to-date.     6. Support services/counseling: It is not uncommon for this period of the patient's cancer care trajectory to be one of many emotions and stressors.  Diana Deleon was encouraged to take advantage of our many support services programs, support groups, and/or counseling in coping with her new life as a cancer survivor after completing anti-cancer treatment.  She was offered support today through active listening and expressive supportive counseling.  She was given information regarding our available services and encouraged to contact me with any questions or for help enrolling in any of our support group/programs.    A total of 65 minutes of face-to-face time was spent with this patient with greater than 50% of that time in counseling and care-coordination.   Mike Craze, NP Survivorship Program [facility] [phone]   Note: PRIMARY CARE PROVIDER Loura Pardon, [place] [phone]

## 2015-01-03 NOTE — Progress Notes (Signed)
Correction to #3 Bone Health of Assessment and Plan:   The patient will be eligible for DEXA baseline screening at age 54 per guidelines and recommendations.   Mike Craze, NP [place] [phone]

## 2015-01-05 ENCOUNTER — Ambulatory Visit: Payer: 59

## 2015-01-05 DIAGNOSIS — M25611 Stiffness of right shoulder, not elsewhere classified: Secondary | ICD-10-CM

## 2015-01-05 DIAGNOSIS — C50411 Malignant neoplasm of upper-outer quadrant of right female breast: Secondary | ICD-10-CM

## 2015-01-05 DIAGNOSIS — Z5189 Encounter for other specified aftercare: Secondary | ICD-10-CM | POA: Diagnosis not present

## 2015-01-05 DIAGNOSIS — M25511 Pain in right shoulder: Secondary | ICD-10-CM

## 2015-01-05 NOTE — Therapy (Signed)
[place], [place], [address] Phone: [phone]   Fax:  [phone]  Physical Therapy Treatment  Patient Details  Name: Diana Deleon MRN: [id number] Date of Birth: 09-Nov-1961 Referring Provider:  Abner Greenspan, MD  Encounter Date: 01/05/2015      PT End of Session - 01/05/15 0932    Visit Number 4   Number of Visits 9   Date for PT Re-Evaluation 01/19/15   PT Start Time 0848   PT Stop Time 0931   PT Time Calculation (min) 43 min      Past Medical History  Diagnosis Date  . History of recurrent UTIs   . History of abnormal Pap smear     has had leep in past  . History of cold sores     has used valtrex in past  . Seborrheic dermatitis     local on R scalp and in ear (failed mult meds- uses steroid spray)  . Intermittent vertigo   . Fibroids     hyst. 2006  . Hypertension   . Thyroid disease   . Hypothyroidism   . GERD (gastroesophageal reflux disease)   . Cyst of cystic duct     chest- central location, taking Cephalexin currently  . Kidney stones   . Anemia     hx  . Arthritis     "right toes" (11/25/2014)  . Breast cancer 2015    Past Surgical History  Procedure Laterality Date  . Tubal ligation    . Vaginal delivery      x3  . Mastectomy complete / simple w/ sentinel node biopsy Right 11/25/2014    sentinel  . Breast reconstruction with placement of tissue expander and flex hd (acellular hydrated dermis) Right 11/25/2014  . Cholecystectomy  2003  . Vaginal hysterectomy  2006    "fibroids"  . Breast biopsy Right 10/2014; 11/2014  . Lithotripsy  ~ 2007  . Simple mastectomy with axillary sentinel node biopsy Right 11/25/2014    Procedure: RIGHT TOTAL  MASTECTOMY WITH AXILLARY SENTINEL NODE BIOPSY ;  Surgeon: Excell Seltzer, MD;  Location: [place];  Service: General;  Laterality: Right;  . Breast reconstruction with placement of tissue expander and flex hd (acellular  hydrated dermis) Right 11/25/2014    Procedure: RIGHT BREAST RECONSTRUCTION WITH PLACEMENT OF TISSUE EXPANDER AND USE OF ACELLULAR DERMRAL MATRIX ;  Surgeon: Crissie Reese, MD;  Location: [place];  Service: Plastics;  Laterality: Right;    There were no vitals taken for this visit.  Visit Diagnosis:  Breast cancer of upper-outer quadrant of right female breast  Stiffness of joint, shoulder region, right  Pain in shoulder region, right  Carcinoma of upper-outer quadrant of right female breast      Subjective Assessment - 01/05/15 0849    Symptoms Getting the cyst removed from my sternum today, might have to cancel my Friday appt. here. Dr. Harlow Mares injected 250 cc's into my expanders on Monday. Was sore yesterday, but feel better today.          [facility] PT Assessment - 01/05/15 0001    AROM   Right Shoulder Flexion 146 Degrees   Right Shoulder ABduction 155 Degrees  Pt tight from being expanded Monday                  [facility] Adult PT Treatment/Exercise - 01/05/15 0001    Shoulder Exercises: Pulleys   Flexion 2 minutes   ABduction 2 minutes  Shoulder Exercises: Therapy Ball   Flexion 10 reps  Roll yellow ball up wall   Shoulder Exercises: ROM/Strengthening   "W" Arms 5 reps   Other ROM/Strengthening Exercises Finger Ladder Rt UE abduction 10 reps   Shoulder Exercises: Stretch   Corner Stretch 5 reps;10 seconds   Manual Therapy   Manual Therapy Myofascial release;Passive ROM   Myofascial Release Rt UE myofascial pulling throughout PROM                   Short Term Clinic Goals - 12/22/14 1428    CC Short Term Goal  #1   Title short term goals= long term goals           Breast Clinic Goals - 10/27/14 1823    Patient will be able to verbalize understanding of pertinent lymphedema risk reduction practices relevant to her diagnosis specifically related to skin care.   Time 1   Period Days   Status Achieved   Patient will be able to return  demonstrate and/or verbalize understanding of the post-op home exercise program related to regaining shoulder range of motion.   Time 1   Period Days   Status Achieved   Patient will be able to verbalize understanding of the importance of attending the postoperative After Breast Cancer Class for further lymphedema risk reduction education and therapeutic exercise.   Time 1   Period Days   Status Achieved          Long Term Clinic Goals - 01/05/15 0934    CC Long Term Goal  #1   Title verbalize understanding of lymphedema risk reduction practices   Status On-going   CC Long Term Goal  #2   Title increase right shoulder flexion to 135 degrees   Baseline 146 degrees on 01/05/15   Status Achieved   CC Long Term Goal  #4   Title independent in home exercise program   Status On-going            Plan - 01/05/15 0933    Clinical Impression Statement Pt had good improvements with AROM flexion, some limitations today with abduction.    Pt will benefit from skilled therapeutic intervention in order to improve on the following deficits Decreased range of motion;Decreased knowledge of precautions;Impaired UE functional use   Rehab Potential Excellent   Clinical Impairments Affecting Rehab Potential none   PT Frequency 2x / week   PT Duration 4 weeks   PT Treatment/Interventions Therapeutic exercise;Manual techniques;Patient/family education   PT Next Visit Plan P/AA/AROM right shoulder.  Patient hopes to come to ABC class, but if she's not able, begin instruction in lymphedema risk reduction practices.         Problem List Patient Active Problem List   Diagnosis Date Noted  . Breast cancer, right breast 11/25/2014  . Breast cancer of upper-outer quadrant of right female breast 10/25/2014  . Viral URI with cough 11/25/2013  . Hyperglycemia 07/14/2013  . Routine general medical examination at a health care facility 10/12/2012  . Subclinical hypothyroidism 06/03/2012  . Elevated  transaminase level 06/03/2012  . Left foot pain 05/27/2012  . Overactive bladder 05/27/2012  . Dermatitis 05/27/2012  . COLD SORE 07/07/2010  . VERTIGO 08/01/2009  . URINARY URGENCY 07/15/2009  . LOW BACK PAIN, CHRONIC 05/17/2008  . HEADACHE, CHRONIC 05/17/2008  . Essential hypertension 02/12/2008  . DERMATITIS, SEBORRHEIC 02/12/2008    Otelia Limes, PTA 01/05/2015, 9:35 AM  [place] [phone]  [place], [place], [address] Phone: [phone]   Fax:  [phone]

## 2015-01-07 ENCOUNTER — Ambulatory Visit: Payer: 59 | Admitting: Physical Therapy

## 2015-01-10 ENCOUNTER — Ambulatory Visit: Payer: 59

## 2015-01-13 ENCOUNTER — Ambulatory Visit: Payer: 59 | Admitting: Physical Therapy

## 2015-01-13 ENCOUNTER — Other Ambulatory Visit: Payer: Self-pay | Admitting: Plastic Surgery

## 2015-01-17 ENCOUNTER — Ambulatory Visit: Payer: 59 | Attending: General Surgery

## 2015-01-17 DIAGNOSIS — M25511 Pain in right shoulder: Secondary | ICD-10-CM

## 2015-01-17 DIAGNOSIS — Z5189 Encounter for other specified aftercare: Secondary | ICD-10-CM | POA: Diagnosis not present

## 2015-01-17 DIAGNOSIS — M25611 Stiffness of right shoulder, not elsewhere classified: Secondary | ICD-10-CM

## 2015-01-17 DIAGNOSIS — C50411 Malignant neoplasm of upper-outer quadrant of right female breast: Secondary | ICD-10-CM

## 2015-01-17 NOTE — Therapy (Signed)
[place], [place], [address] Phone: [phone]   Fax:  [phone]  Physical Therapy Treatment  Patient Details  Name: Diana Deleon MRN: [id number] Date of Birth: 12-25-1960 Referring Provider:  Abner Greenspan, MD  Encounter Date: 01/17/2015      PT End of Session - 01/17/15 0818    Visit Number 5   Number of Visits 9   Date for PT Re-Evaluation 01/19/15   PT Start Time 0801   PT Stop Time 0846   PT Time Calculation (min) 45 min      Past Medical History  Diagnosis Date  . History of recurrent UTIs   . History of abnormal Pap smear     has had leep in past  . History of cold sores     has used valtrex in past  . Seborrheic dermatitis     local on R scalp and in ear (failed mult meds- uses steroid spray)  . Intermittent vertigo   . Fibroids     hyst. 2006  . Hypertension   . Thyroid disease   . Hypothyroidism   . GERD (gastroesophageal reflux disease)   . Cyst of cystic duct     chest- central location, taking Cephalexin currently  . Kidney stones   . Anemia     hx  . Arthritis     "right toes" (11/25/2014)  . Breast cancer 2015    Past Surgical History  Procedure Laterality Date  . Tubal ligation    . Vaginal delivery      x3  . Mastectomy complete / simple w/ sentinel node biopsy Right 11/25/2014    sentinel  . Breast reconstruction with placement of tissue expander and flex hd (acellular hydrated dermis) Right 11/25/2014  . Cholecystectomy  2003  . Vaginal hysterectomy  2006    "fibroids"  . Breast biopsy Right 10/2014; 11/2014  . Lithotripsy  ~ 2007  . Simple mastectomy with axillary sentinel node biopsy Right 11/25/2014    Procedure: RIGHT TOTAL  MASTECTOMY WITH AXILLARY SENTINEL NODE BIOPSY ;  Surgeon: Excell Seltzer, MD;  Location: [place];  Service: General;  Laterality: Right;  . Breast reconstruction with placement of tissue expander and flex hd (acellular hydrated  dermis) Right 11/25/2014    Procedure: RIGHT BREAST RECONSTRUCTION WITH PLACEMENT OF TISSUE EXPANDER AND USE OF ACELLULAR DERMRAL MATRIX ;  Surgeon: Crissie Reese, MD;  Location: [place];  Service: Plastics;  Laterality: Right;    There were no vitals taken for this visit.  Visit Diagnosis:  Breast cancer of upper-outer quadrant of right female breast  Stiffness of joint, shoulder region, right  Pain in shoulder region, right  Carcinoma of upper-outer quadrant of right female breast      Subjective Assessment - 01/17/15 0806    Symptoms Had the cyst removed last Wednesday (they had to change the original date) and its doing well. Dr. Harlow Mares was worried it would get infected but its doing good!   Currently in Pain? No/denies                    [facility] Adult PT Treatment/Exercise - 01/17/15 0001    Shoulder Exercises: Pulleys   Flexion 2 minutes   ABduction 2 minutes   Shoulder Exercises: Therapy Ball   Flexion 10 reps  Roll yellow ball up wall   Shoulder Exercises: ROM/Strengthening   Other ROM/Strengthening Exercises Finger Ladder Rt UE abduction 10 reps up to 26  Manual Therapy   Passive ROM In Supine to Rt shoulder into flexion, abduction, D2 and neural tension stretch all to pts tolerance.   PT REPORTED NO DISCOMFORT WITH ACTIVITIES TODAY                   Short Term Clinic Goals - 12/22/14 1428    CC Short Term Goal  #1   Title short term goals= long term goals           Breast Clinic Goals - 10/27/14 1823    Patient will be able to verbalize understanding of pertinent lymphedema risk reduction practices relevant to her diagnosis specifically related to skin care.   Time 1   Period Days   Status Achieved   Patient will be able to return demonstrate and/or verbalize understanding of the post-op home exercise program related to regaining shoulder range of motion.   Time 1   Period Days   Status Achieved   Patient will be able to verbalize  understanding of the importance of attending the postoperative After Breast Cancer Class for further lymphedema risk reduction education and therapeutic exercise.   Time 1   Period Days   Status Achieved          Long Term Clinic Goals - 01/05/15 0934    CC Long Term Goal  #1   Title verbalize understanding of lymphedema risk reduction practices   Status On-going   CC Long Term Goal  #2   Title increase right shoulder flexion to 135 degrees   Baseline 146 degrees on 01/05/15   Status Achieved   CC Long Term Goal  #4   Title independent in home exercise program   Status On-going            Plan - 01/17/15 0847    Clinical Impression Statement Pt with some increased tightness today from not being here last few weeks due to weather and surgery.   Pt will benefit from skilled therapeutic intervention in order to improve on the following deficits Decreased range of motion;Decreased knowledge of precautions;Impaired UE functional use   Rehab Potential Excellent   Clinical Impairments Affecting Rehab Potential none   PT Frequency 2x / week   PT Duration 4 weeks   PT Treatment/Interventions Therapeutic exercise;Manual techniques;Patient/family education   PT Next Visit Plan P/AA/AROM right shoulder.  Patient hopes to come to ABC class, but if she's not able, begin instruction in lymphedema risk reduction practices.         Problem List Patient Active Problem List   Diagnosis Date Noted  . Breast cancer, right breast 11/25/2014  . Breast cancer of upper-outer quadrant of right female breast 10/25/2014  . Viral URI with cough 11/25/2013  . Hyperglycemia 07/14/2013  . Routine general medical examination at a health care facility 10/12/2012  . Subclinical hypothyroidism 06/03/2012  . Elevated transaminase level 06/03/2012  . Left foot pain 05/27/2012  . Overactive bladder 05/27/2012  . Dermatitis 05/27/2012  . COLD SORE 07/07/2010  . VERTIGO 08/01/2009  . URINARY URGENCY  07/15/2009  . LOW BACK PAIN, CHRONIC 05/17/2008  . HEADACHE, CHRONIC 05/17/2008  . Essential hypertension 02/12/2008  . DERMATITIS, SEBORRHEIC 02/12/2008    Otelia Limes, PTA 01/17/2015, 8:49 AM  [place], [place], [address] Phone: [phone]   Fax:  [phone]

## 2015-01-20 ENCOUNTER — Ambulatory Visit: Payer: 59 | Admitting: Physical Therapy

## 2015-01-20 DIAGNOSIS — M25611 Stiffness of right shoulder, not elsewhere classified: Secondary | ICD-10-CM

## 2015-01-20 DIAGNOSIS — Z5189 Encounter for other specified aftercare: Secondary | ICD-10-CM | POA: Diagnosis not present

## 2015-01-20 DIAGNOSIS — M25511 Pain in right shoulder: Secondary | ICD-10-CM

## 2015-01-20 DIAGNOSIS — C50411 Malignant neoplasm of upper-outer quadrant of right female breast: Secondary | ICD-10-CM

## 2015-01-20 NOTE — Therapy (Signed)
[place], [place], [address] Phone: [phone]   Fax:  [phone]  Physical Therapy Treatment  Patient Details  Name: Diana Deleon MRN: [id number] Date of Birth: 08-18-1961 Referring Provider:  Abner Greenspan, MD  Encounter Date: 01/20/2015    Past Medical History  Diagnosis Date  . History of recurrent UTIs   . History of abnormal Pap smear     has had leep in past  . History of cold sores     has used valtrex in past  . Seborrheic dermatitis     local on R scalp and in ear (failed mult meds- uses steroid spray)  . Intermittent vertigo   . Fibroids     hyst. 2006  . Hypertension   . Thyroid disease   . Hypothyroidism   . GERD (gastroesophageal reflux disease)   . Cyst of cystic duct     chest- central location, taking Cephalexin currently  . Kidney stones   . Anemia     hx  . Arthritis     "right toes" (11/25/2014)  . Breast cancer 2015    Past Surgical History  Procedure Laterality Date  . Tubal ligation    . Vaginal delivery      x3  . Mastectomy complete / simple w/ sentinel node biopsy Right 11/25/2014    sentinel  . Breast reconstruction with placement of tissue expander and flex hd (acellular hydrated dermis) Right 11/25/2014  . Cholecystectomy  2003  . Vaginal hysterectomy  2006    "fibroids"  . Breast biopsy Right 10/2014; 11/2014  . Lithotripsy  ~ 2007  . Simple mastectomy with axillary sentinel node biopsy Right 11/25/2014    Procedure: RIGHT TOTAL  MASTECTOMY WITH AXILLARY SENTINEL NODE BIOPSY ;  Surgeon: Excell Seltzer, MD;  Location: [place];  Service: General;  Laterality: Right;  . Breast reconstruction with placement of tissue expander and flex hd (acellular hydrated dermis) Right 11/25/2014    Procedure: RIGHT BREAST RECONSTRUCTION WITH PLACEMENT OF TISSUE EXPANDER AND USE OF ACELLULAR DERMRAL MATRIX ;  Surgeon: Crissie Reese, MD;  Location: [place];  Service:  Plastics;  Laterality: Right;    There were no vitals taken for this visit.  Visit Diagnosis:  No diagnosis found.      Subjective Assessment - 01/20/15 0843    Symptoms pt said she was feeling a little stiff after surgery and missing a few sessiion  "its getting better"  Pt states she has a pulley system at home that she is using too.  She has been having some pain in anterior shoulder with dowel rod exercise and states that she has not been able to sleep in her bed due to cyst removal and pain.  She has been sleeping in a recliner chair.    Currently in Pain? Yes   Pain Score 8   when she raises her arm over her head with the dowel rod. pain has been increasing over the past 3 weeks.                    [facility] Adult PT Treatment/Exercise - 01/20/15 1749    Lumbar Exercises: Supine   Bridge 5 reps   Other Supine Lumbar Exercises lower trunk rotation   Lumbar Exercises: Sidelying   Clam 5 reps   Shoulder Exercises: Pulleys   Flexion 2 minutes   ABduction 2 minutes   Shoulder Exercises: ROM/Strengthening   Other ROM/Strengthening Exercises sidelying right external  and internal rotation   Shoulder Exercises: Stretch   Cross Chest Stretch 1 rep   Wall Stretch - Flexion 1 rep   Wall Stretch - ABduction Limitations unable due to "pulling"at anterior chest                PT Education - 01/20/15 1338    Education Details Strength ABC and logging    Person(s) Educated Patient   Methods Explanation;Demonstration;Verbal cues;Handout   Comprehension Need further instruction           Short Term Clinic Goals - 12/22/14 1428    CC Short Term Goal  #1   Title short term goals= long term goals           Breast Clinic Goals - 10/27/14 1823    Patient will be able to verbalize understanding of pertinent lymphedema risk reduction practices relevant to her diagnosis specifically related to skin care.   Time 1   Period Days   Status Achieved   Patient will be  able to return demonstrate and/or verbalize understanding of the post-op home exercise program related to regaining shoulder range of motion.   Time 1   Period Days   Status Achieved   Patient will be able to verbalize understanding of the importance of attending the postoperative After Breast Cancer Class for further lymphedema risk reduction education and therapeutic exercise.   Time 1   Period Days   Status Achieved          Long Term Clinic Goals - 01/05/15 0934    CC Long Term Goal  #1   Title verbalize understanding of lymphedema risk reduction practices   Status On-going   CC Long Term Goal  #2   Title increase right shoulder flexion to 135 degrees   Baseline 146 degrees on 01/05/15   Status Achieved   CC Long Term Goal  #4   Title independent in home exercise program   Status On-going            Plan - 01/20/15 1756    Clinical Impression Statement Pt currently limited in stretching by pain at anterior chest and is developing some anterior shoulder pain from dowel exercise (advised pt to stop these)  She is still restricted from doing weight training, but she did active rotator cuff  exercises today to help with better position of humeral head with shoudler elevation.  She will benefit  from contiued therapy as she recovers to help with shoulder movement and instruct in general exercise program that she will eventually be able to do with weight to increase her strength Issued link for lymphedema education session as pt has not been able to attend our class   Pt will benefit from skilled therapeutic intervention in order to improve on the following deficits Decreased range of motion;Decreased knowledge of precautions;Impaired UE functional use;Pain   Rehab Potential Excellent   PT Frequency 2x / week   PT Duration 4 weeks   PT Treatment/Interventions Therapeutic exercise;Manual techniques;Patient/family education   PT Next Visit Plan Continue with instruction in Strength  ABC program  without weights. continue to modify exercise progression by her pain from healing anterior chest.        Problem List Patient Active Problem List   Diagnosis Date Noted  . Breast cancer, right breast 11/25/2014  . Breast cancer of upper-outer quadrant of right female breast 10/25/2014  . Viral URI with cough 11/25/2013  . Hyperglycemia 07/14/2013  . Routine general medical examination  at a health care facility 10/12/2012  . Subclinical hypothyroidism 06/03/2012  . Elevated transaminase level 06/03/2012  . Left foot pain 05/27/2012  . Overactive bladder 05/27/2012  . Dermatitis 05/27/2012  . COLD SORE 07/07/2010  . VERTIGO 08/01/2009  . URINARY URGENCY 07/15/2009  . LOW BACK PAIN, CHRONIC 05/17/2008  . HEADACHE, CHRONIC 05/17/2008  . Essential hypertension 02/12/2008  . DERMATITIS, SEBORRHEIC 02/12/2008   Donato Heinz. Owens Shark, [place]   01/20/2015, 6:03 PM  [place], [place], [address] Phone: [phone]   Fax:  [phone]

## 2015-01-20 NOTE — Patient Instructions (Signed)
[email or web address] C[employer] Online strength ABC Right hand side of page : lymphedema education session

## 2015-01-25 ENCOUNTER — Ambulatory Visit: Payer: 59

## 2015-01-25 DIAGNOSIS — M25511 Pain in right shoulder: Secondary | ICD-10-CM

## 2015-01-25 DIAGNOSIS — Z5189 Encounter for other specified aftercare: Secondary | ICD-10-CM | POA: Diagnosis not present

## 2015-01-25 DIAGNOSIS — C50411 Malignant neoplasm of upper-outer quadrant of right female breast: Secondary | ICD-10-CM

## 2015-01-25 DIAGNOSIS — M25611 Stiffness of right shoulder, not elsewhere classified: Secondary | ICD-10-CM

## 2015-01-25 NOTE — Therapy (Signed)
[place], [place], [address] Phone: [phone]   Fax:  [phone]  Physical Therapy Treatment  Patient Details  Name: Diana Deleon MRN: [id number] Date of Birth: 01/23/1961 Referring Provider:  Abner Greenspan, MD  Encounter Date: 01/25/2015      PT End of Session - 01/25/15 1006    Visit Number 6   Number of Visits 9   Date for PT Re-Evaluation 01/19/15   PT Start Time 0939   PT Stop Time 1018   PT Time Calculation (min) 39 min      Past Medical History  Diagnosis Date  . History of recurrent UTIs   . History of abnormal Pap smear     has had leep in past  . History of cold sores     has used valtrex in past  . Seborrheic dermatitis     local on R scalp and in ear (failed mult meds- uses steroid spray)  . Intermittent vertigo   . Fibroids     hyst. 2006  . Hypertension   . Thyroid disease   . Hypothyroidism   . GERD (gastroesophageal reflux disease)   . Cyst of cystic duct     chest- central location, taking Cephalexin currently  . Kidney stones   . Anemia     hx  . Arthritis     "right toes" (11/25/2014)  . Breast cancer 2015    Past Surgical History  Procedure Laterality Date  . Tubal ligation    . Vaginal delivery      x3  . Mastectomy complete / simple w/ sentinel node biopsy Right 11/25/2014    sentinel  . Breast reconstruction with placement of tissue expander and flex hd (acellular hydrated dermis) Right 11/25/2014  . Cholecystectomy  2003  . Vaginal hysterectomy  2006    "fibroids"  . Breast biopsy Right 10/2014; 11/2014  . Lithotripsy  ~ 2007  . Simple mastectomy with axillary sentinel node biopsy Right 11/25/2014    Procedure: RIGHT TOTAL  MASTECTOMY WITH AXILLARY SENTINEL NODE BIOPSY ;  Surgeon: Excell Seltzer, MD;  Location: [place];  Service: General;  Laterality: Right;  . Breast reconstruction with placement of tissue expander and flex hd (acellular hydrated  dermis) Right 11/25/2014    Procedure: RIGHT BREAST RECONSTRUCTION WITH PLACEMENT OF TISSUE EXPANDER AND USE OF ACELLULAR DERMRAL MATRIX ;  Surgeon: Crissie Reese, MD;  Location: [place];  Service: Plastics;  Laterality: Right;    There were no vitals taken for this visit.  Visit Diagnosis:  Breast cancer of upper-outer quadrant of right female breast  Stiffness of joint, shoulder region, right  Pain in shoulder region, right  Carcinoma of upper-outer quadrant of right female breast      Subjective Assessment - 01/25/15 0940    Symptoms My Rt shoulder isnt hurting right now, just in certain positions. Havent tried the dowel exercise in a few days so not sure if its better. My stitches should come out Monday after my appt here.    Currently in Pain? No/denies                    [facility] Adult PT Treatment/Exercise - 01/25/15 0001    Shoulder Exercises: Therapy Ball   Flexion 10 reps  Roll yellow ball up wall   Shoulder Exercises: ROM/Strengthening   Other ROM/Strengthening Exercises All Stretches from Strength ABC Program; Bil chest, shoulder, tricep, calf, quadriceps, hamstrings and figure 4 piriformis,  and supine bil low trunk rotation all 15 sec and 1 rep.    Other ROM/Strengthening Exercises Strength Exrecises from Strength ABC Program: Bridging, Bil S/L clamshell, (demonstrated superwoman for pt but did not have her perform) Lt S/L ER for Rt shoulder, supine cane flexion 5 reps with no pain today, standing against wall "W", Rt UE row (pt with good technique), squats, bil hip abductions with cuing for technique/no hip hike, scaption against wall, bil 6" step ups, bil tricep keckbacks, calf raises, and bicep curls all 10 reps                PT Education - 01/25/15 1006    Education provided Yes   Education Details Completed Strength ABC Program instruction   Person(s) Educated Patient   Methods Explanation;Demonstration;Verbal cues   Comprehension Verbalized  understanding;Returned demonstration;Verbal cues required;Need further instruction           Short Term Clinic Goals - 12/22/14 1428    CC Short Term Goal  #1   Title short term goals= long term goals           Breast Clinic Goals - 10/27/14 1823    Patient will be able to verbalize understanding of pertinent lymphedema risk reduction practices relevant to her diagnosis specifically related to skin care.   Time 1   Period Days   Status Achieved   Patient will be able to return demonstrate and/or verbalize understanding of the post-op home exercise program related to regaining shoulder range of motion.   Time 1   Period Days   Status Achieved   Patient will be able to verbalize understanding of the importance of attending the postoperative After Breast Cancer Class for further lymphedema risk reduction education and therapeutic exercise.   Time 1   Period Days   Status Achieved          Long Term Clinic Goals - 01/05/15 0934    CC Long Term Goal  #1   Title verbalize understanding of lymphedema risk reduction practices   Status On-going   CC Long Term Goal  #2   Title increase right shoulder flexion to 135 degrees   Baseline 146 degrees on 01/05/15   Status Achieved   CC Long Term Goal  #4   Title independent in home exercise program   Status On-going            Plan - 01/25/15 1007    Clinical Impression Statement Pt still slightly limited with strethcing today due to healing incision at superior sternum, however less limited today as incision is healing well. Pt had good understanding of most exercises and able to return correct demo after PTA demonstrated.   Pt will benefit from skilled therapeutic intervention in order to improve on the following deficits Decreased range of motion;Decreased knowledge of precautions;Impaired UE functional use;Pain   Rehab Potential Excellent   Clinical Impairments Affecting Rehab Potential none   PT Frequency 2x / week   PT  Duration 4 weeks   PT Treatment/Interventions Therapeutic exercise;Manual techniques;Patient/family education   PT Next Visit Plan Continue with instruction in Strength ABC program  without weights. continue to modify exercise progression by her pain from healing anterior chest.   Consulted and Agree with Plan of Care Patient        Problem List Patient Active Problem List   Diagnosis Date Noted  . Breast cancer, right breast 11/25/2014  . Breast cancer of upper-outer quadrant of right female breast 10/25/2014  .  Viral URI with cough 11/25/2013  . Hyperglycemia 07/14/2013  . Routine general medical examination at a health care facility 10/12/2012  . Subclinical hypothyroidism 06/03/2012  . Elevated transaminase level 06/03/2012  . Left foot pain 05/27/2012  . Overactive bladder 05/27/2012  . Dermatitis 05/27/2012  . COLD SORE 07/07/2010  . VERTIGO 08/01/2009  . URINARY URGENCY 07/15/2009  . LOW BACK PAIN, CHRONIC 05/17/2008  . HEADACHE, CHRONIC 05/17/2008  . Essential hypertension 02/12/2008  . DERMATITIS, SEBORRHEIC 02/12/2008    Otelia Limes, PTA 01/25/2015, 10:19 AM  [place], [place], [address] Phone: [phone]   Fax:  [phone]

## 2015-01-31 ENCOUNTER — Ambulatory Visit: Payer: 59 | Admitting: Physical Therapy

## 2015-02-03 ENCOUNTER — Ambulatory Visit: Payer: 59 | Admitting: Physical Therapy

## 2015-02-03 ENCOUNTER — Encounter: Payer: Self-pay | Admitting: Physical Therapy

## 2015-02-03 DIAGNOSIS — M25511 Pain in right shoulder: Secondary | ICD-10-CM

## 2015-02-03 DIAGNOSIS — M25611 Stiffness of right shoulder, not elsewhere classified: Secondary | ICD-10-CM

## 2015-02-03 DIAGNOSIS — Z5189 Encounter for other specified aftercare: Secondary | ICD-10-CM | POA: Diagnosis not present

## 2015-02-03 NOTE — Patient Instructions (Signed)
  Strengthening: Resisted Internal Rotation   Hold tubing in left hand, elbow at side and forearm out. Rotate forearm in across body. Repeat ____ times per set. Do ____ sets per session. Do ____ sessions per day.  http://orth.exer.us/830   Copyright  VHI. All rights reserved.  Strengthening: Resisted External Rotation   Hold tubing in right hand, elbow at side and forearm across body. Rotate forearm out. Repeat ____ times per set. Do ____ sets per session. Do ____ sessions per day.  http://orth.exer.us/828   Copyright  VHI. All rights reserved.  Shoulder (Scapula) Retraction   Pull shoulders back, squeezing shoulder blades together. Repeat ____ times per session. Do ____ sessions per week. Position: Standing   Copyright  VHI. All rights reserved.

## 2015-02-03 NOTE — Therapy (Signed)
[place], [place], [address] Phone: [phone]   Fax:  [phone]  Physical Therapy Treatment  Patient Details  Name: Diana Deleon MRN: [id number] Date of Birth: June 20, 1961 Referring Provider:  Abner Greenspan, MD  Encounter Date: 02/03/2015      PT End of Session - 02/03/15 0849    Visit Number 7   Number of Visits 17   Date for PT Re-Evaluation 02/17/15   PT Start Time 0800   PT Stop Time 0845   PT Time Calculation (min) 45 min      Past Medical History  Diagnosis Date  . History of recurrent UTIs   . History of abnormal Pap smear     has had leep in past  . History of cold sores     has used valtrex in past  . Seborrheic dermatitis     local on R scalp and in ear (failed mult meds- uses steroid spray)  . Intermittent vertigo   . Fibroids     hyst. 2006  . Hypertension   . Thyroid disease   . Hypothyroidism   . GERD (gastroesophageal reflux disease)   . Cyst of cystic duct     chest- central location, taking Cephalexin currently  . Kidney stones   . Anemia     hx  . Arthritis     "right toes" (11/25/2014)  . Breast cancer 2015    Past Surgical History  Procedure Laterality Date  . Tubal ligation    . Vaginal delivery      x3  . Mastectomy complete / simple w/ sentinel node biopsy Right 11/25/2014    sentinel  . Breast reconstruction with placement of tissue expander and flex hd (acellular hydrated dermis) Right 11/25/2014  . Cholecystectomy  2003  . Vaginal hysterectomy  2006    "fibroids"  . Breast biopsy Right 10/2014; 11/2014  . Lithotripsy  ~ 2007  . Simple mastectomy with axillary sentinel node biopsy Right 11/25/2014    Procedure: RIGHT TOTAL  MASTECTOMY WITH AXILLARY SENTINEL NODE BIOPSY ;  Surgeon: Excell Seltzer, MD;  Location: [facility];  Service: General;  Laterality: Right;  . Breast reconstruction with placement of tissue expander and flex hd (acellular  hydrated dermis) Right 11/25/2014    Procedure: RIGHT BREAST RECONSTRUCTION WITH PLACEMENT OF TISSUE EXPANDER AND USE OF ACELLULAR DERMRAL MATRIX ;  Surgeon: Crissie Reese, MD;  Location: [facility];  Service: Plastics;  Laterality: Right;    There were no vitals taken for this visit.  Visit Diagnosis:  Stiffness of joint, shoulder region, right  Pain in shoulder region, right      Subjective Assessment - 02/03/15 0807    Symptoms Feeling really good got stitches out this week and got cleared to lift weights  Plans to have implant placed on right on April 5 and will have reduction on the left.   Currently in Pain? Yes   Pain Score 4   with movement , no pain at rest          [facility] PT Assessment - 02/03/15 0859    AROM   Right Shoulder Flexion 158 Degrees   Right Shoulder ABduction 165 Degrees                  [facility] Adult PT Treatment/Exercise - 02/03/15 0850    Lumbar Exercises: Supine   Bridge 5 reps;10 reps   Other Supine Lumbar Exercises lower trunk rotation   Other  Supine Lumbar Exercises spinal realignment program x 3 reps   Lumbar Exercises: Sidelying   Clam 10 reps   Knee/Hip Exercises: Stretches   Active Hamstring Stretch 2 reps;10 seconds   Gastroc Stretch 2 reps;10 seconds   Shoulder Exercises: Supine   Other Supine Exercises chest press 2 sets of 10 with 1#   Shoulder Exercises: Seated   Other Seated Exercises scapular retraction   Shoulder Exercises: Standing   External Rotation Strengthening;10 reps;Right   Theraband Level (Shoulder External Rotation) Level 2 (Red)   Internal Rotation Strengthening;Right;10 reps   Retraction Strengthening;10 reps   Theraband Level (Shoulder Retraction) Level 2 (Red)   Retraction Weight (lbs) verbal and tactil cues to keep scapula depressed and move smoothly.  Pt reports she 'cannot tell" if she is doing it right   Other Standing Exercises one arm row 2 sets of 10 with 1#   Other Standing Exercises tricep kickback 2  sets of 10 with 1# o   Shoulder Exercises: Pulleys   Flexion 2 minutes   ABduction 2 minutes   Shoulder Exercises: Stretch   Cross Chest Stretch 1 rep   Wall Stretch - Flexion 1 rep   Wall Stretch - ABduction 1 rep                PT Education - 02/03/15 0848    Education provided Yes   Education Details exercise review   Person(s) Educated Patient   Methods Explanation;Demonstration;Handout   Comprehension Need further instruction           Short Term Clinic Goals - 12/22/14 1428    CC Short Term Goal  #1   Title short term goals= long term goals           Breast Clinic Goals - 10/27/14 1823    Patient will be able to verbalize understanding of pertinent lymphedema risk reduction practices relevant to her diagnosis specifically related to skin care.   Time 1   Period Days   Status Achieved   Patient will be able to return demonstrate and/or verbalize understanding of the post-op home exercise program related to regaining shoulder range of motion.   Time 1   Period Days   Status Achieved   Patient will be able to verbalize understanding of the importance of attending the postoperative After Breast Cancer Class for further lymphedema risk reduction education and therapeutic exercise.   Time 1   Period Days   Status Achieved          Long Term Clinic Goals - 02/03/15 0900    CC Long Term Goal  #1   Title verbalize understanding of lymphedema risk reduction practices   Status On-going   CC Long Term Goal  #2   Title increase right shoulder flexion to 135 degrees   Status Achieved   CC Long Term Goal  #3   Title increase right shoulder abduction to 135    Status Achieved   CC Long Term Goal  #4   Title independent in home exercise program   Status On-going            Plan - 02/03/15 0856    Clinical Impression Statement pt is pogressing steadilly She continues to have some right shoulder pain and emphasis today was on posture, neuromusculare  re-eductaion for correct timing of scapular muscle activation to perform smooth movement for seating of humeral heal in glenoid fossa. She needs continued work on this as well and postural muscle strengthening  and general strengthening.    PT Next Visit Plan Continue with instruction in Strength ABC program  possibley progress ot 2# weights,  review [place] and spinal decompression series with attention to posture        Problem List Patient Active Problem List   Diagnosis Date Noted  . Breast cancer, right breast 11/25/2014  . Breast cancer of upper-outer quadrant of right female breast 10/25/2014  . Viral URI with cough 11/25/2013  . Hyperglycemia 07/14/2013  . Routine general medical examination at a health care facility 10/12/2012  . Subclinical hypothyroidism 06/03/2012  . Elevated transaminase level 06/03/2012  . Left foot pain 05/27/2012  . Overactive bladder 05/27/2012  . Dermatitis 05/27/2012  . COLD SORE 07/07/2010  . VERTIGO 08/01/2009  . URINARY URGENCY 07/15/2009  . LOW BACK PAIN, CHRONIC 05/17/2008  . HEADACHE, CHRONIC 05/17/2008  . Essential hypertension 02/12/2008  . DERMATITIS, SEBORRHEIC 02/12/2008   Donato Heinz. Owens Shark, PT 02/03/2015, 9:02 AM  [place], [place], [address] Phone: [phone]   Fax:  [phone]

## 2015-02-07 ENCOUNTER — Ambulatory Visit: Payer: 59 | Admitting: Physical Therapy

## 2015-02-07 ENCOUNTER — Encounter: Payer: 59 | Admitting: Physical Therapy

## 2015-02-07 DIAGNOSIS — M25511 Pain in right shoulder: Secondary | ICD-10-CM

## 2015-02-07 DIAGNOSIS — M25611 Stiffness of right shoulder, not elsewhere classified: Secondary | ICD-10-CM

## 2015-02-07 DIAGNOSIS — Z5189 Encounter for other specified aftercare: Secondary | ICD-10-CM | POA: Diagnosis not present

## 2015-02-07 NOTE — Therapy (Signed)
[place], [place], [address] Phone: [phone]   Fax:  [phone]  Physical Therapy Treatment  Patient Details  Name: Diana Deleon MRN: [id number] Date of Birth: 02-Sep-1961 Referring Provider:  Abner Greenspan, MD  Encounter Date: 02/07/2015      PT End of Session - 02/07/15 0853    Visit Number 8   Number of Visits 17   Date for PT Re-Evaluation 02/17/15   PT Start Time 0802   PT Stop Time 0845   PT Time Calculation (min) 43 min   Activity Tolerance Patient tolerated treatment well      Past Medical History  Diagnosis Date  . History of recurrent UTIs   . History of abnormal Pap smear     has had leep in past  . History of cold sores     has used valtrex in past  . Seborrheic dermatitis     local on R scalp and in ear (failed mult meds- uses steroid spray)  . Intermittent vertigo   . Fibroids     hyst. 2006  . Hypertension   . Thyroid disease   . Hypothyroidism   . GERD (gastroesophageal reflux disease)   . Cyst of cystic duct     chest- central location, taking Cephalexin currently  . Kidney stones   . Anemia     hx  . Arthritis     "right toes" (11/25/2014)  . Breast cancer 2015    Past Surgical History  Procedure Laterality Date  . Tubal ligation    . Vaginal delivery      x3  . Mastectomy complete / simple w/ sentinel node biopsy Right 11/25/2014    sentinel  . Breast reconstruction with placement of tissue expander and flex hd (acellular hydrated dermis) Right 11/25/2014  . Cholecystectomy  2003  . Vaginal hysterectomy  2006    "fibroids"  . Breast biopsy Right 10/2014; 11/2014  . Lithotripsy  ~ 2007  . Simple mastectomy with axillary sentinel node biopsy Right 11/25/2014    Procedure: RIGHT TOTAL  MASTECTOMY WITH AXILLARY SENTINEL NODE BIOPSY ;  Surgeon: Excell Seltzer, MD;  Location: [facility];  Service: General;  Laterality: Right;  . Breast reconstruction with  placement of tissue expander and flex hd (acellular hydrated dermis) Right 11/25/2014    Procedure: RIGHT BREAST RECONSTRUCTION WITH PLACEMENT OF TISSUE EXPANDER AND USE OF ACELLULAR DERMRAL MATRIX ;  Surgeon: Crissie Reese, MD;  Location: [facility];  Service: Plastics;  Laterality: Right;    There were no vitals taken for this visit.  Visit Diagnosis:  Stiffness of joint, shoulder region, right  Pain in shoulder region, right      Subjective Assessment - 02/07/15 0804    Symptoms "I think my shoulder is feeling a little better" " I haven't noticed the twingy feelings"   Currently in Pain? Yes  only occasaionally   Pain Score 3                     [facility] Adult PT Treatment/Exercise - 02/07/15 0835    Lumbar Exercises: Standing   Heel Raises 10 reps   Functional Squats 10 reps  2 sets   Other Standing Lumbar Exercises alternate arm and leg raise for back extension/stabalizeation with leaning forward onto armrests of chair    Other Standing Lumbar Exercises forward hip hinge 2 sets of 10 with 2#   Lumbar Exercises: Supine   Bridge 10 reps  Other Supine Lumbar Exercises lower trunk rotation   Lumbar Exercises: Sidelying   Clam 10 reps   Knee/Hip Exercises: Stretches   Active Hamstring Stretch 2 reps;10 seconds   Quad Stretch 2 reps   Hip Flexor Stretch 2 reps   Piriformis Stretch 2 reps   Gastroc Stretch 2 reps;10 seconds   Knee/Hip Exercises: Standing   Forward Step Up Step Height: 8";10 reps;2 sets;Both   Shoulder Exercises: Supine   Other Supine Exercises chest press 2 sets of 10 with 2#   Shoulder Exercises: Seated   Other Seated Exercises scapular retraction   Shoulder Exercises: Standing   External Rotation Strengthening;10 reps;Right   Theraband Level (Shoulder External Rotation) Level 2 (Red)   Retraction Strengthening;10 reps   Theraband Level (Shoulder Retraction) Level 2 (Red)   Retraction Weight (lbs) verbal and tactil cues to keep scapula depressed and  move smoothly.  Pt reports she 'cannot tell" if she is doing it right   Other Standing Exercises tricep kickback 2 sets of 10 with 2 #   Shoulder Exercises: ROM/Strengthening   "W" Arms 2 sets of 10 with back against the wall    Other ROM/Strengthening Exercises scaption with back agains the wall with 2# 2 sets of 10   Other ROM/Strengthening Exercises bicep curls 2 sets fof 10 with 2#   Shoulder Exercises: Stretch   Cross Chest Stretch 1 rep   Wall Stretch - Flexion 1 rep   Wall Stretch - ABduction 1 rep   Other Shoulder Stretches triceps stretch in standing   Manual Therapy   Manual Therapy Massage                PT Education - 02/07/15 0853    Education provided Yes   Education Details strength ABC exercise review   Person(s) Educated Patient   Methods Explanation;Demonstration;Handout   Comprehension Returned demonstration           Short Term Clinic Goals - 12/22/14 1428    CC Short Term Goal  #1   Title short term goals= long term goals           Breast Clinic Goals - 10/27/14 1823    Patient will be able to verbalize understanding of pertinent lymphedema risk reduction practices relevant to her diagnosis specifically related to skin care.   Time 1   Period Days   Status Achieved   Patient will be able to return demonstrate and/or verbalize understanding of the post-op home exercise program related to regaining shoulder range of motion.   Time 1   Period Days   Status Achieved   Patient will be able to verbalize understanding of the importance of attending the postoperative After Breast Cancer Class for further lymphedema risk reduction education and therapeutic exercise.   Time 1   Period Days   Status Achieved          Long Term Clinic Goals - 02/03/15 0900    CC Long Term Goal  #1   Title verbalize understanding of lymphedema risk reduction practices   Status On-going   CC Long Term Goal  #2   Title increase right shoulder flexion to 135  degrees   Status Achieved   CC Long Term Goal  #3   Title increase right shoulder abduction to 135    Status Achieved   CC Long Term Goal  #4   Title independent in home exercise program   Status On-going  Plan - 02/07/15 7078    PT Next Visit Plan assess her comfort with strength ABC program and progression.  Continue with with scapular mobilization and continues strengthening to scapular muscles. Remeasure ROM to address goals         Problem List Patient Active Problem List   Diagnosis Date Noted  . Breast cancer, right breast 11/25/2014  . Breast cancer of upper-outer quadrant of right female breast 10/25/2014  . Viral URI with cough 11/25/2013  . Hyperglycemia 07/14/2013  . Routine general medical examination at a health care facility 10/12/2012  . Subclinical hypothyroidism 06/03/2012  . Elevated transaminase level 06/03/2012  . Left foot pain 05/27/2012  . Overactive bladder 05/27/2012  . Dermatitis 05/27/2012  . COLD SORE 07/07/2010  . VERTIGO 08/01/2009  . URINARY URGENCY 07/15/2009  . LOW BACK PAIN, CHRONIC 05/17/2008  . HEADACHE, CHRONIC 05/17/2008  . Essential hypertension 02/12/2008  . DERMATITIS, SEBORRHEIC 02/12/2008   Donato Heinz. Owens Shark, PT   02/07/2015, 9:01 AM  [place], [place], [address] Phone: [phone]   Fax:  [phone]

## 2015-02-10 ENCOUNTER — Ambulatory Visit: Payer: 59 | Admitting: Physical Therapy

## 2015-02-10 DIAGNOSIS — M25611 Stiffness of right shoulder, not elsewhere classified: Secondary | ICD-10-CM

## 2015-02-10 DIAGNOSIS — M25511 Pain in right shoulder: Secondary | ICD-10-CM

## 2015-02-10 DIAGNOSIS — Z5189 Encounter for other specified aftercare: Secondary | ICD-10-CM | POA: Diagnosis not present

## 2015-02-10 NOTE — Therapy (Addendum)
[place], [place], [address] Phone: [phone]   Fax:  [phone]  Physical Therapy Treatment  Patient Details  Name: Diana Deleon MRN: [id number] Date of Birth: 04-29-1961 Referring Provider:  Abner Greenspan, MD  Encounter Date: 02/10/2015      PT End of Session - 02/10/15 0841    Visit Number 9   Number of Visits 17   Date for PT Re-Evaluation 02/17/15   PT Start Time 0805   PT Stop Time 0835   PT Time Calculation (min) 30 min   Activity Tolerance Patient tolerated treatment well   Behavior During Therapy [facility] for tasks assessed/performed      Past Medical History  Diagnosis Date  . History of recurrent UTIs   . History of abnormal Pap smear     has had leep in past  . History of cold sores     has used valtrex in past  . Seborrheic dermatitis     local on R scalp and in ear (failed mult meds- uses steroid spray)  . Intermittent vertigo   . Fibroids     hyst. 2006  . Hypertension   . Thyroid disease   . Hypothyroidism   . GERD (gastroesophageal reflux disease)   . Cyst of cystic duct     chest- central location, taking Cephalexin currently  . Kidney stones   . Anemia     hx  . Arthritis     "right toes" (11/25/2014)  . Breast cancer 2015    Past Surgical History  Procedure Laterality Date  . Tubal ligation    . Vaginal delivery      x3  . Mastectomy complete / simple w/ sentinel node biopsy Right 11/25/2014    sentinel  . Breast reconstruction with placement of tissue expander and flex hd (acellular hydrated dermis) Right 11/25/2014  . Cholecystectomy  2003  . Vaginal hysterectomy  2006    "fibroids"  . Breast biopsy Right 10/2014; 11/2014  . Lithotripsy  ~ 2007  . Simple mastectomy with axillary sentinel node biopsy Right 11/25/2014    Procedure: RIGHT TOTAL  MASTECTOMY WITH AXILLARY SENTINEL NODE BIOPSY ;  Surgeon: Excell Seltzer, MD;  Location: [facility];  Service:  General;  Laterality: Right;  . Breast reconstruction with placement of tissue expander and flex hd (acellular hydrated dermis) Right 11/25/2014    Procedure: RIGHT BREAST RECONSTRUCTION WITH PLACEMENT OF TISSUE EXPANDER AND USE OF ACELLULAR DERMRAL MATRIX ;  Surgeon: Crissie Reese, MD;  Location: [facility];  Service: Plastics;  Laterality: Right;    There were no vitals taken for this visit.  Visit Diagnosis:  Stiffness of joint, shoulder region, right  Pain in shoulder region, right      Subjective Assessment - 02/10/15 0839    Symptoms Pt reports she is doing better. she only has occasional pain in right shoulder now with extreme shoulder stretching   Currently in Pain? No/denies          [facility] PT Assessment - 02/10/15 0001    AROM   Right Shoulder Flexion 156 Degrees   Right Shoulder ABduction 174 Degrees              Quick Dash - 02/10/15 0001    Open a tight or new jar No difficulty   Do heavy household chores (wash walls, wash floors) No difficulty   Carry a shopping bag or briefcase No difficulty   Wash your back No difficulty  Use a knife to cut food No difficulty   Recreational activities in which you take some force or impact through your arm, shoulder, or hand (golf, hammering, tennis) No difficulty   During the past week, to what extent has your arm, shoulder or hand problem interfered with your normal social activities with family, friends, neighbors, or groups? Not at all   During the past week, to what extent has your arm, shoulder or hand problem limited your work or other regular daily activities Not at all   Arm, shoulder, or hand pain. None   Tingling (pins and needles) in your arm, shoulder, or hand None   Difficulty Sleeping No difficulty   DASH Score 0 %                    PT Education - 02/10/15 0840    Education provided Yes   Education Details lymphedema risk reduction, (issued handouts from ABC class)  potential compression  garments   Person(s) Educated Patient   Methods Explanation;Demonstration;Handout   Comprehension Verbalized understanding           Short Term Clinic Goals - 12/22/14 1428    CC Short Term Goal  #1   Title short term goals= long term goals           Breast Clinic Goals - 10/27/14 1823    Patient will be able to verbalize understanding of pertinent lymphedema risk reduction practices relevant to her diagnosis specifically related to skin care.   Time 1   Period Days   Status Achieved   Patient will be able to return demonstrate and/or verbalize understanding of the post-op home exercise program related to regaining shoulder range of motion.   Time 1   Period Days   Status Achieved   Patient will be able to verbalize understanding of the importance of attending the postoperative After Breast Cancer Class for further lymphedema risk reduction education and therapeutic exercise.   Time 1   Period Days   Status Achieved          Long Term Clinic Goals - 02/10/15 0809    CC Long Term Goal  #1   Title verbalize understanding of lymphedema risk reduction practices   Status Achieved   CC Long Term Goal  #2   Title increase right shoulder flexion to 135 degrees  156   Status Achieved   CC Long Term Goal  #3   Title increase right shoulder abduction to 135   174   Status Achieved   CC Long Term Goal  #4   Title independent in home exercise program   Status Achieved            Plan - 02/10/15 8786    Clinical Impression Statement Pt is ready for discharge from physical therapy.  she reports she is independent in home exercise program for strengthening and posture and will continue to do it at home.  She feels that she is knowledgable about how to manage her symptoms after upcoming sugery but knows to ask Dr.Bowers for a referral is she feels she needs to come back.   PT Next Visit Plan Discharge from this episode of care.         Problem List Patient Active  Problem List   Diagnosis Date Noted  . Breast cancer, right breast 11/25/2014  . Breast cancer of upper-outer quadrant of right female breast 10/25/2014  . Viral URI with cough 11/25/2013  . Hyperglycemia  07/14/2013  . Routine general medical examination at a health care facility 10/12/2012  . Subclinical hypothyroidism 06/03/2012  . Elevated transaminase level 06/03/2012  . Left foot pain 05/27/2012  . Overactive bladder 05/27/2012  . Dermatitis 05/27/2012  . COLD SORE 07/07/2010  . VERTIGO 08/01/2009  . URINARY URGENCY 07/15/2009  . LOW BACK PAIN, CHRONIC 05/17/2008  . HEADACHE, CHRONIC 05/17/2008  . Essential hypertension 02/12/2008  . DERMATITIS, SEBORRHEIC 02/12/2008     PHYSICAL THERAPY DISCHARGE SUMMARY  Visits from Start of Care: 9  Current functional level related to goals / functional outcomes: independent   Remaining deficits: Rare intermittent shoulder pain   Education / Equipment: Home exercise program, lymphedema risk reduction, possible need for compression garment Plan: Patient agrees to discharge.  Patient goals were met. Patient is being discharged due to meeting the stated rehab goals.  ?????        Donato Heinz. Owens Shark, PT  02/10/2015, 8:46 AM  [place], [place], [address] Phone: [phone]   Fax:  [phone]

## 2015-02-14 ENCOUNTER — Ambulatory Visit: Payer: 59 | Admitting: Physical Therapy

## 2015-02-17 ENCOUNTER — Ambulatory Visit: Payer: 59 | Admitting: Physical Therapy

## 2015-02-21 ENCOUNTER — Encounter: Payer: 59 | Admitting: Physical Therapy

## 2015-02-22 ENCOUNTER — Other Ambulatory Visit: Payer: Self-pay | Admitting: Family Medicine

## 2015-02-25 ENCOUNTER — Encounter: Payer: 59 | Admitting: Physical Therapy

## 2015-02-28 ENCOUNTER — Telehealth: Payer: Self-pay | Admitting: Hematology and Oncology

## 2015-02-28 ENCOUNTER — Encounter: Payer: 59 | Admitting: Physical Therapy

## 2015-02-28 ENCOUNTER — Other Ambulatory Visit (HOSPITAL_COMMUNITY): Payer: Self-pay | Admitting: Plastic Surgery

## 2015-02-28 NOTE — Telephone Encounter (Signed)
Ret pt call to r/s her appt  anne

## 2015-03-02 ENCOUNTER — Encounter: Payer: BC Managed Care – HMO | Admitting: Family Medicine

## 2015-03-04 ENCOUNTER — Encounter: Payer: 59 | Admitting: Physical Therapy

## 2015-03-11 ENCOUNTER — Encounter: Payer: Self-pay | Admitting: Adult Health

## 2015-03-11 ENCOUNTER — Other Ambulatory Visit: Payer: Self-pay | Admitting: Adult Health

## 2015-03-11 DIAGNOSIS — T451X5A Adverse effect of antineoplastic and immunosuppressive drugs, initial encounter: Principal | ICD-10-CM

## 2015-03-11 DIAGNOSIS — R232 Flushing: Secondary | ICD-10-CM

## 2015-03-11 MED ORDER — VENLAFAXINE HCL ER 37.5 MG PO CP24: 37.5000 mg | ORAL_CAPSULE | Freq: Every day | ORAL | Status: DC

## 2015-03-11 NOTE — Progress Notes (Signed)
Ms. Welcome sent me the below email:   "Martyn Ehrich,  I hope you are doing well, I am.  My last visit with you was 01/03/2015. Linell Meldrum - patient ID [id number] DOB - 05/26/1961  I am not sure if this is the appropriate method of contacting you about this and if an appointment is needed I will certainly be glad to make one.  In fact I have an appointment with Dr. Lindi Adie on the 4th.  I just thought I would try this first.    The hot flashes are driving me crazy!  I thought over time they would subside some but that is not the case.   My biggest problem with them is that they are effecting my sleep.  I have never been the best sleeper in that once I wake up I have a hard time getting back to sleep. The hot flashes are waking me 5+ times a night.  I have never been one to need a lot of sleep but this is getting ridiculous.   I see in the treatment summary you gave me in January that antidepressants can sometimes be prescribed for relief, as well as "gabapentin".  I want to see if I can get one of these and try to get some relief from this condition.  Is this something you can help me with or would this need to go through Dr. Lindi Adie?    If you need to contact me directly please don't hesitate to call me. [phone].  I probably should mention that my second surgery for reconstruction is scheduled for 03/24/15.  Getting close now.  [name]!  Thank you for all you do.  Janann August"  ------------------------------------------------------------------------  ~~I called Ms. Callaway and spoke to her regarding her hot flashes.  She tells me that she is having several bothersome hot flashes during the day and also 5+ hot flashes at night since beginning the Tamoxifen.  She reports that this is drastically affecting her ability to sleep.  We discussed 2 of the options to help women with hot flashes r/t Tamoxifen, which include gabapentin and Effexor XR.  I offered to call in some gabapentin 300 mg QHS to help  with the hot flashes at night, but after further discussion, she felt that she would prefer to try the low-dose Effexor instead as she hopes this may help her mood as well as the hot flashes.    She will see Dr. Lindi Adie on 03/21/15 and I encouraged her to let him or myself know if the hot flashes were still bothersome and we could potentially add on gabapentin at bedtime or potentially increase the Effexor XR, depending on her response to the medication.  I did educate her that with the initiation of medications like Effexor, she may experience some nausea as her body becomes acclimated to the medication. She is with verbal agreement of this plan and expressed gratitude for this new prescription.    I verified her pharmacy and  e-prescribed Effexor XR 37.5 mg once daily to [facility] in [place].  I encouraged her to call me if she has any additional questions or concerns and I would be more than happy to help!   I will let Dr. Lindi Adie know about her symptoms and our updated plan.   Mike Craze, NP [place] [phone]

## 2015-03-11 NOTE — Progress Notes (Signed)
Diana Deleon sent me the below email:   "Diana Deleon,  I hope you are doing well, I am.  My last visit with you was 01/03/2015. Diana Deleon - patient ID [id number] DOB - 11/21/1961  I am not sure if this is the appropriate method of contacting you about this and if an appointment is needed I will certainly be glad to make one. In fact I have an appointment with Diana Deleon on the 4th. I just thought I would try this first.   The hot flashes are driving me crazy! I thought over time they would subside some but that is not the case. My biggest problem with them is that they are effecting my sleep. I have never been the best sleeper in that once I wake up I have a hard time getting back to sleep. The hot flashes are waking me 5+ times a night. I have never been one to need a lot of sleep but this is getting ridiculous.   I see in the treatment summary you gave me in January that antidepressants can sometimes be prescribed for relief, as well as "gabapentin". I want to see if I can get one of these and try to get some relief from this condition. Is this something you can help me with or would this need to go through Diana Deleon?   If you need to contact me directly please don't hesitate to call me. [phone].  I probably should mention that my second surgery for reconstruction is scheduled for 03/24/15. Getting close now. Diana Deleon!  Thank you for all you do.  Diana Deleon"  ------------------------------------------------------------------------  ~~I called Diana Deleon and spoke to her regarding her hot flashes. She tells me that she is having several bothersome hot flashes during the day and also 5+ hot flashes at night since beginning the Tamoxifen. She reports that this is drastically affecting her ability to sleep. We discussed 2 of the options to help women with hot flashes r/t Tamoxifen, which include gabapentin and Effexor XR. I offered to call in some gabapentin 300 mg QHS to help  with the hot flashes at night, but after further discussion, she felt that she would prefer to try the low-dose Effexor instead as she hopes this may help her mood as well as the hot flashes.   She will see Diana Deleon on 03/21/15 and I encouraged her to let him or myself know if the hot flashes were still bothersome and we could potentially add on gabapentin at bedtime or potentially increase the Effexor XR, depending on her response to the medication. I did educate her that with the initiation of medications like Effexor, she may experience some nausea as her body becomes acclimated to the medication. She is with verbal agreement of this plan and expressed gratitude for this new prescription.   I verified her pharmacy and e-prescribed Effexor XR 37.5 mg once daily to [facility] in [place]. I encouraged her to call me if she has any additional questions or concerns and I would be more than happy to help!   I will let Diana Deleon know about her symptoms and our updated plan.   Mike Craze, NP [place] [phone]

## 2015-03-17 ENCOUNTER — Encounter (HOSPITAL_COMMUNITY)
Admission: RE | Admit: 2015-03-17 | Discharge: 2015-03-17 | Disposition: A | Discharge status: Home / Self Care | Payer: 59 | Source: Ambulatory Visit | Attending: Plastic Surgery | Admitting: Plastic Surgery

## 2015-03-17 ENCOUNTER — Other Ambulatory Visit (HOSPITAL_COMMUNITY): Payer: Self-pay | Admitting: *Deleted

## 2015-03-17 ENCOUNTER — Encounter (HOSPITAL_COMMUNITY): Payer: Self-pay

## 2015-03-17 DIAGNOSIS — Z01812 Encounter for preprocedural laboratory examination: Secondary | ICD-10-CM | POA: Insufficient documentation

## 2015-03-17 LAB — BASIC METABOLIC PANEL
ANION GAP: 7 (ref 5–15)
BUN: 12 mg/dL (ref 6–23)
CALCIUM: 9.3 mg/dL (ref 8.4–10.5)
CHLORIDE: 105 mmol/L (ref 96–112)
CO2: 29 mmol/L (ref 19–32)
Creatinine, Ser: 1.08 mg/dL (ref 0.50–1.10)
GFR, EST AFRICAN AMERICAN: 67 mL/min — AB (ref >90)
GFR, EST NON AFRICAN AMERICAN: 58 mL/min — AB (ref >90)
Glucose, Bld: 106 mg/dL — ABNORMAL HIGH (ref 70–99)
Potassium: 3.7 mmol/L (ref 3.5–5.1)
Sodium: 141 mmol/L (ref 135–145)

## 2015-03-17 LAB — CBC
HCT: 42 % (ref 36.0–46.0)
Hemoglobin: 13.9 g/dL (ref 12.0–15.0)
MCH: 29.8 pg (ref 26.0–34.0)
MCHC: 33.1 g/dL (ref 30.0–36.0)
MCV: 89.9 fL (ref 78.0–100.0)
Platelets: 214 10*3/uL (ref 150–400)
RBC: 4.67 MIL/uL (ref 3.87–5.11)
RDW: 13.2 % (ref 11.5–15.5)
WBC: 5.8 10*3/uL (ref 4.0–10.5)

## 2015-03-17 NOTE — Pre-Procedure Instructions (Signed)
Diana Deleon  03/17/2015   Your procedure is scheduled on:  Thursday, March 24, 2015 at 7:30 AM.   Report to [facility] Entrance "A" Admitting Office at 5:30 AM.   Call this number if you have problems the morning of surgery: [phone]               Any questions prior to day of surgery, please call [phone] between 8 & 4 PM.   Remember:   Do not eat food or drink liquids after midnight Wednesday, 03/23/15.   Take these medicines the morning of surgery with A SIP OF WATER: Levothyroxine (Synthroid), Omeprazole (Prilosec), Tamoxifen (Nolvadex), Venlafaxine XR (Effexor XR), Tylenol - if needed   Do not wear jewelry, make-up or nail polish.  Do not wear lotions, powders, or perfumes. You may NOT wear deodorant.  Do not shave 48 hours prior to surgery.   Do not bring valuables to the hospital.  [facility] is not responsible                  for any belongings or valuables.               Contacts, dentures or bridgework may not be worn into surgery.  Leave suitcase in the car. After surgery it may be brought to your room.  For patients admitted to the hospital, discharge time is determined by your                treatment team.               Patients discharged the day of surgery will not be allowed to drive home.    Special Instructions:  - Preparing for Surgery  Before surgery, you can play an important role.  Because skin is not sterile, your skin needs to be as free of germs as possible.  You can reduce the number of germs on you skin by washing with CHG (chlorahexidine gluconate) soap before surgery.  CHG is an antiseptic cleaner which kills germs and bonds with the skin to continue killing germs even after washing.  Please DO NOT use if you have an allergy to CHG or antibacterial soaps.  If your skin becomes reddened/irritated stop using the CHG and inform your nurse when you arrive at Short Stay.  Do not shave (including legs and underarms) for at least 48  hours prior to the first CHG shower.  You may shave your face.  Please follow these instructions carefully:   1.  Shower with CHG Soap the night before surgery and the                                morning of Surgery.  2.  If you choose to wash your hair, wash your hair first as usual with your       normal shampoo.  3.  After you shampoo, rinse your hair and body thoroughly to remove the                      Shampoo.  4.  Use CHG as you would any other liquid soap.  You can apply chg directly       to the skin and wash gently with scrungie or a clean washcloth.  5.  Apply the CHG Soap to your body ONLY FROM THE NECK DOWN.        Do  not use on open wounds or open sores.  Avoid contact with your eyes, ears, mouth and genitals (private parts).  Wash genitals (private parts) with your normal soap.  6.  Wash thoroughly, paying special attention to the area where your surgery        will be performed.  7.  Thoroughly rinse your body with warm water from the neck down.  8.  DO NOT shower/wash with your normal soap after using and rinsing off       the CHG Soap.  9.  Pat yourself dry with a clean towel.            10.  Wear clean pajamas.            11.  Place clean sheets on your bed the night of your first shower and do not        sleep with pets.  Day of Surgery  Do not apply any lotions/deodorants the morning of surgery.  Please wear clean clothes to the hospital.     Please read over the following fact sheets that you were given: Pain Booklet, Coughing and Deep Breathing and Surgical Site Infection Prevention

## 2015-03-17 NOTE — Progress Notes (Signed)
   03/17/15 0925  OBSTRUCTIVE SLEEP APNEA  Have you ever been diagnosed with sleep apnea through a sleep study? No  Do you snore loudly (loud enough to be heard through closed doors)?  1  Do you often feel tired, fatigued, or sleepy during the daytime? 1  Has anyone observed you stop breathing during your sleep? 0  Do you have, or are you being treated for high blood pressure? 1  BMI more than 35 kg/m2? 1  Age over 53 years old? 1  Neck circumference greater than 40 cm/16 inches? 0  Gender: 0

## 2015-03-17 NOTE — Progress Notes (Signed)
Stop Bang assessment sent to Dr. Glori Bickers, pt's PCP.

## 2015-03-21 ENCOUNTER — Other Ambulatory Visit: Payer: 59

## 2015-03-21 ENCOUNTER — Ambulatory Visit (HOSPITAL_BASED_OUTPATIENT_CLINIC_OR_DEPARTMENT_OTHER): Payer: 59 | Admitting: Hematology and Oncology

## 2015-03-21 ENCOUNTER — Telehealth: Payer: Self-pay | Admitting: Hematology and Oncology

## 2015-03-21 VITALS — BP 133/75 | HR 86 | Temp 98.7°F | Resp 18 | Ht 64.0 in | Wt 213.4 lb

## 2015-03-21 DIAGNOSIS — C50411 Malignant neoplasm of upper-outer quadrant of right female breast: Secondary | ICD-10-CM | POA: Diagnosis not present

## 2015-03-21 DIAGNOSIS — Z170 Estrogen receptor positive status [ER+]: Secondary | ICD-10-CM | POA: Diagnosis not present

## 2015-03-21 LAB — FOLLICLE STIMULATING HORMONE: FSH: 63.9 m[IU]/mL

## 2015-03-21 NOTE — Progress Notes (Signed)
Patient Care Team: Abner Greenspan, MD as PCP - General Nicholas Lose, MD as Consulting Physician (Hematology and Oncology) Excell Seltzer, MD as Consulting Physician (General Surgery) Eppie Gibson, MD as Attending Physician (Radiation Oncology) Holley Bouche, NP as Nurse Practitioner (Nurse Practitioner)  DIAGNOSIS: Breast cancer of upper-outer quadrant of right female breast   Staging form: Breast, AJCC 7th Edition     Clinical: Stage IIA (T2, N0, M0) - Unsigned       Staging comments: Staged at breast conference on 11.11.15      Pathologic stage from 11/25/2014: Stage IIA (T2(2), N0, cM0) - Unsigned       Staging comments: Staged from final excisional pathology from Dr. Donato Heinz  SUMMARY OF ONCOLOGIC HISTORY:   Breast cancer of upper-outer quadrant of right female breast   10/20/2014 Initial Diagnosis Right breast invasive and in situ mammary cancer grade 2 E-cadherin positive, invasive ductal carcinoma with DCIS focal atypical cells negative for atypia and ER 70% PR 80% HER-2 negative ratio 1.26 Ki-67 27%   10/22/2014 Breast MRI Right breast upper outer quadrant 2.3 cm biopsy-proven malignancy   11/25/2014 Surgery Right simple mastectomy: Tumor #1 upper lateral invasive high-grade ductal carcinoma 2.8 cm plus high-grade DCIS plus ALH and LCIS; tumor #2 upper medial DCIS 1.5 cm. Margins negative.   11/25/2014 Oncotype testing 18 (11% 10-year risk of distant recurrence on Tamoxifen alone).    01/03/2015 -  Anti-estrogen oral therapy Tamoxifen started. Planned duration of treatment is 10 years (end date 12/2024)    CHIEF COMPLIANT: follow-up on tamoxifen therapy  INTERVAL HISTORY: Diana Deleon is a 54 year old with above-mentioned history of right-sided breast cancer who has low risk disease based on Oncotype DX currently on tamoxifen she started 01/03/2015. She started noticing hot flashes that wake her up at night that last 20-30 minutes. She discussed this with our nurse practitioner who  started her on Effexor. She does not have any myalgias.  REVIEW OF SYSTEMS:   Constitutional: Denies fevers, chills or abnormal weight loss; severe hot flashes Eyes: Denies blurriness of vision Ears, nose, mouth, throat, and face: Denies mucositis or sore throat Respiratory: Denies cough, dyspnea or wheezes Cardiovascular: Denies palpitation, chest discomfort or lower extremity swelling Gastrointestinal:  Denies nausea, heartburn or change in bowel habits Skin: Denies abnormal skin rashes Lymphatics: Denies new lymphadenopathy or easy bruising Neurological:Denies numbness, tingling or new weaknesses Behavioral/Psych: Mood is stable, no new changes  Breast:  denies any pain or lumps or nodules in either breasts All other systems were reviewed with the patient and are negative.  I have reviewed the past medical history, past surgical history, social history and family history with the patient and they are unchanged from previous note.  ALLERGIES:  is allergic to norvasc.  MEDICATIONS:  Current Outpatient Prescriptions  Medication Sig Dispense Refill  . acetaminophen (TYLENOL) 500 MG tablet Take 1,000 mg by mouth every 6 (six) hours as needed for mild pain or moderate pain.     [name] Kitchen docusate sodium 100 MG CAPS Take 100 mg by mouth daily. 10 capsule 0  . levothyroxine (SYNTHROID, LEVOTHROID) 25 MCG tablet TAKE 1 TABLET BY MOUTH DAILY BEFORE BREAKFAST. 90 tablet 3  . levothyroxine (SYNTHROID, LEVOTHROID) 25 MCG tablet TAKE 1 TABLET BY MOUTH DAILY BEFORE BREAKFAST. 90 tablet 3  . losartan-hydrochlorothiazide (HYZAAR) 50-12.5 MG per tablet Take 1 tablet by mouth daily. 90 tablet 3  . omeprazole (PRILOSEC OTC) 20 MG tablet Take 20 mg by mouth daily before breakfast.     .  tamoxifen (NOLVADEX) 20 MG tablet Take 1 tablet (20 mg total) by mouth daily. 90 tablet 3  . venlafaxine XR (EFFEXOR XR) 37.5 MG 24 hr capsule Take 1 capsule (37.5 mg total) by mouth daily with breakfast. 30 capsule 6   No  current facility-administered medications for this visit.    PHYSICAL EXAMINATION: ECOG PERFORMANCE STATUS: 0 - Asymptomatic  Filed Vitals:   03/21/15 1352  BP: 133/75  Pulse: 86  Temp: 98.7 F (37.1 C)  Resp: 18   Filed Weights   03/21/15 1352  Weight: 213 lb 6.4 oz (96.798 kg)    GENERAL:alert, no distress and comfortable SKIN: skin color, texture, turgor are normal, no rashes or significant lesions EYES: normal, Conjunctiva are pink and non-injected, sclera clear OROPHARYNX:no exudate, no erythema and lips, buccal mucosa, and tongue normal  NECK: supple, thyroid normal size, non-tender, without nodularity LYMPH:  no palpable lymphadenopathy in the cervical, axillary or inguinal LUNGS: clear to auscultation and percussion with normal breathing effort HEART: regular rate & rhythm and no murmurs and no lower extremity edema ABDOMEN:abdomen soft, non-tender and normal bowel sounds Musculoskeletal:no cyanosis of digits and no clubbing  NEURO: alert & oriented x 3 with fluent speech, no focal motor/sensory deficits  LABORATORY DATA:  I have reviewed the data as listed   Chemistry      Component Value Date/Time   NA 141 03/17/2015 0927   NA 138 10/27/2014 1236   K 3.7 03/17/2015 0927   K 3.8 10/27/2014 1236   CL 105 03/17/2015 0927   CO2 29 03/17/2015 0927   CO2 29 10/27/2014 1236   BUN 12 03/17/2015 0927   BUN 11.9 10/27/2014 1236   CREATININE 1.08 03/17/2015 0927   CREATININE 0.9 10/27/2014 1236      Component Value Date/Time   CALCIUM 9.3 03/17/2015 0927   CALCIUM 9.3 10/27/2014 1236   ALKPHOS 77 10/27/2014 1236   ALKPHOS 78 07/07/2013 0859   AST 18 10/27/2014 1236   AST 18 07/07/2013 0859   ALT 22 10/27/2014 1236   ALT 19 07/07/2013 0859   BILITOT 0.64 10/27/2014 1236   BILITOT 0.6 07/07/2013 0859       Lab Results  Component Value Date   WBC 5.8 03/17/2015   HGB 13.9 03/17/2015   HCT 42.0 03/17/2015   MCV 89.9 03/17/2015   PLT 214 03/17/2015    NEUTROABS 5.1 10/27/2014    ASSESSMENT & PLAN:  Breast cancer of upper-outer quadrant of right female breast Right breast invasive ductal carcinoma 2.8 cm status post mastectomy grade 3 with high-grade DCIS, ALH and LCIS; separate tumor which was DCIS 1.5 cm, 1 SLN negative, ER 86%, PR 95%, HER-2 negative ratio 1.52, Ki-67 27% T2 N0 M0 stage II a, Oncotype Dx score 18 (11% ROR) Low risk; started tamoxifen 12/27/2014  Tamoxifen toxicities: Hot flashes that wake her up at night. She was started on Effexor by our nurse practitioner just a few days ago. So far she has not noticed any change. But I discussed with her that it could take 2 weeks for the medicine to be in her system. I would also send her blood work for [facility] and estradiol. If she is postmenopausal I will consider switching her from tamoxifen to anastrozole. I discussed the risks and benefits and differences between tamoxifen and anastrozole. She would certainly have increased risk of myalgias but much less risk of hot flashes.  Breast cancer surveillance: 1. Mammograms to be done October 2016 2. Breast exams every  6 months    Orders Placed This Encounter  Procedures  . Follicle stimulating hormone    Standing Status: Future     Number of Occurrences:      Standing Expiration Date: 04/24/2016  . Estradiol, Ultra Sens    Standing Status: Future     Number of Occurrences:      Standing Expiration Date: 03/20/2016   The patient has a good understanding of the overall plan. she agrees with it. She will call with any problems that may develop before her next visit here.   Rulon Eisenmenger, MD

## 2015-03-21 NOTE — Telephone Encounter (Signed)
per pof to sch pt appt-gave pt copy of sch °

## 2015-03-21 NOTE — Assessment & Plan Note (Signed)
Right breast invasive ductal carcinoma 2.8 cm status post mastectomy grade 3 with high-grade DCIS, ALH and LCIS; separate tumor which was DCIS 1.5 cm, 1 SLN negative, ER 86%, PR 95%, HER-2 negative ratio 1.52, Ki-67 27% T2 N0 M0 stage II a, Oncotype Dx score 18 (11% ROR) Low risk; started tamoxifen 12/27/2014  Tamoxifen toxicities: Hot flashes that wake her up at night. She was started on Effexor by our nurse practitioner just a few days ago. So far she has not noticed any change. But I discussed with her that it could take 2 weeks for the medicine to be in her system. I would also send her blood work for [facility] and estradiol. If she is postmenopausal I will consider switching her from tamoxifen to anastrozole. I discussed the risks and benefits and differences between tamoxifen and anastrozole. She would certainly have increased risk of myalgias but much less risk of hot flashes.  Breast cancer surveillance: 1. Mammograms to be done October 2016 2. Breast exams every 6 months

## 2015-03-23 MED ORDER — CEFAZOLIN SODIUM-DEXTROSE 2-3 GM-% IV SOLR
2.0000 g | INTRAVENOUS | Status: AC
  Administered 2015-03-24: 2 g via INTRAVENOUS
  Filled 2015-03-23: qty 50

## 2015-03-23 MED ORDER — HEPARIN SODIUM (PORCINE) 5000 UNIT/ML IJ SOLN
5000.0000 [IU] | Freq: Once | INTRAMUSCULAR | Status: AC
Start: 2015-03-23 — End: 2015-03-24
  Administered 2015-03-24: 5000 [IU] via SUBCUTANEOUS
  Filled 2015-03-23: qty 1

## 2015-03-24 ENCOUNTER — Encounter (HOSPITAL_COMMUNITY)
Admission: RE | Disposition: A | Discharge status: Home / Self Care | Payer: Self-pay | Source: Ambulatory Visit | Attending: Plastic Surgery

## 2015-03-24 ENCOUNTER — Ambulatory Visit (HOSPITAL_COMMUNITY): Payer: 59 | Admitting: Anesthesiology

## 2015-03-24 ENCOUNTER — Encounter (HOSPITAL_COMMUNITY): Payer: Self-pay | Admitting: *Deleted

## 2015-03-24 ENCOUNTER — Ambulatory Visit (HOSPITAL_COMMUNITY)
Admission: RE | Admit: 2015-03-24 | Discharge: 2015-03-25 | Disposition: A | Discharge status: Home / Self Care | Payer: 59 | Source: Ambulatory Visit | Attending: Plastic Surgery | Admitting: Plastic Surgery

## 2015-03-24 DIAGNOSIS — I10 Essential (primary) hypertension: Secondary | ICD-10-CM | POA: Insufficient documentation

## 2015-03-24 DIAGNOSIS — N62 Hypertrophy of breast: Secondary | ICD-10-CM | POA: Diagnosis not present

## 2015-03-24 DIAGNOSIS — E039 Hypothyroidism, unspecified: Secondary | ICD-10-CM | POA: Insufficient documentation

## 2015-03-24 DIAGNOSIS — R920 Mammographic microcalcification found on diagnostic imaging of breast: Secondary | ICD-10-CM | POA: Diagnosis not present

## 2015-03-24 DIAGNOSIS — K219 Gastro-esophageal reflux disease without esophagitis: Secondary | ICD-10-CM | POA: Diagnosis not present

## 2015-03-24 DIAGNOSIS — Z421 Encounter for breast reconstruction following mastectomy: Secondary | ICD-10-CM | POA: Diagnosis not present

## 2015-03-24 DIAGNOSIS — Z888 Allergy status to other drugs, medicaments and biological substances status: Secondary | ICD-10-CM | POA: Diagnosis not present

## 2015-03-24 DIAGNOSIS — C50911 Malignant neoplasm of unspecified site of right female breast: Secondary | ICD-10-CM | POA: Diagnosis not present

## 2015-03-24 HISTORY — DX: Malignant neoplasm of unspecified site of right female breast: C50.911

## 2015-03-24 HISTORY — PX: BREAST REDUCTION SURGERY: SHX8

## 2015-03-24 HISTORY — PX: REMOVAL OF TISSUE EXPANDER AND PLACEMENT OF IMPLANT: SHX6457

## 2015-03-24 HISTORY — PX: REMOVAL OF TISSUE EXPANDER: SHX6324

## 2015-03-24 SURGERY — REMOVAL, TISSUE EXPANDER, BREAST, WITH IMPLANT INSERTION
Anesthesia: General | Site: Breast | Laterality: Right

## 2015-03-24 MED ORDER — LEVOTHYROXINE SODIUM 25 MCG PO TABS
25.0000 ug | ORAL_TABLET | Freq: Every day | ORAL | Status: DC
  Administered 2015-03-25: 25 ug via ORAL
  Filled 2015-03-24: qty 1

## 2015-03-24 MED ORDER — DEXAMETHASONE SODIUM PHOSPHATE 4 MG/ML IJ SOLN
INTRAMUSCULAR | Status: AC
  Filled 2015-03-24: qty 1

## 2015-03-24 MED ORDER — METHOCARBAMOL 500 MG PO TABS
500.0000 mg | ORAL_TABLET | Freq: Four times a day (QID) | ORAL | Status: DC | PRN
  Administered 2015-03-24 (×2): 500 mg via ORAL
  Filled 2015-03-24: qty 1

## 2015-03-24 MED ORDER — NEOSTIGMINE METHYLSULFATE 10 MG/10ML IV SOLN
INTRAVENOUS | Status: AC
  Filled 2015-03-24: qty 1

## 2015-03-24 MED ORDER — HYDROMORPHONE HCL 1 MG/ML IJ SOLN: 0.2500 mg | INTRAMUSCULAR | Status: DC | PRN

## 2015-03-24 MED ORDER — SUCCINYLCHOLINE CHLORIDE 20 MG/ML IJ SOLN
INTRAMUSCULAR | Status: AC
  Filled 2015-03-24: qty 1

## 2015-03-24 MED ORDER — PANTOPRAZOLE SODIUM 20 MG PO TBEC
20.0000 mg | DELAYED_RELEASE_TABLET | Freq: Every day | ORAL | Status: DC
  Filled 2015-03-24 (×2): qty 1

## 2015-03-24 MED ORDER — FENTANYL CITRATE 0.05 MG/ML IJ SOLN
INTRAMUSCULAR | Status: AC
  Filled 2015-03-24: qty 5

## 2015-03-24 MED ORDER — DEXAMETHASONE SODIUM PHOSPHATE 4 MG/ML IJ SOLN
INTRAMUSCULAR | Status: DC | PRN
  Administered 2015-03-24: 4 mg via INTRAVENOUS

## 2015-03-24 MED ORDER — OMEPRAZOLE MAGNESIUM 20 MG PO TBEC: 20.0000 mg | DELAYED_RELEASE_TABLET | Freq: Every day | ORAL | Status: DC

## 2015-03-24 MED ORDER — ACETAMINOPHEN 500 MG PO TABS
1000.0000 mg | ORAL_TABLET | Freq: Four times a day (QID) | ORAL | Status: DC | PRN
  Administered 2015-03-24: 975 mg via ORAL

## 2015-03-24 MED ORDER — PHENYLEPHRINE 40 MCG/ML (10ML) SYRINGE FOR IV PUSH (FOR BLOOD PRESSURE SUPPORT)
PREFILLED_SYRINGE | INTRAVENOUS | Status: AC
  Filled 2015-03-24: qty 10

## 2015-03-24 MED ORDER — DOCUSATE SODIUM 100 MG PO CAPS
100.0000 mg | ORAL_CAPSULE | Freq: Every day | ORAL | Status: DC
  Administered 2015-03-24 – 2015-03-25 (×2): 100 mg via ORAL
  Filled 2015-03-24 (×2): qty 1

## 2015-03-24 MED ORDER — CEFAZOLIN SODIUM 1-5 GM-% IV SOLN
1.0000 g | Freq: Four times a day (QID) | INTRAVENOUS | Status: DC
  Administered 2015-03-24 – 2015-03-25 (×4): 1 g via INTRAVENOUS
  Filled 2015-03-24 (×6): qty 50

## 2015-03-24 MED ORDER — GLYCOPYRROLATE 0.2 MG/ML IJ SOLN
INTRAMUSCULAR | Status: DC | PRN
  Administered 2015-03-24: 0.4 mg via INTRAVENOUS

## 2015-03-24 MED ORDER — HYDROMORPHONE HCL 1 MG/ML IJ SOLN
0.2500 mg | INTRAMUSCULAR | Status: DC | PRN
  Administered 2015-03-24: 0.5 mg via INTRAVENOUS

## 2015-03-24 MED ORDER — SODIUM CHLORIDE 0.9 % IJ SOLN
INTRAMUSCULAR | Status: AC
  Filled 2015-03-24: qty 20

## 2015-03-24 MED ORDER — SODIUM CHLORIDE 0.9 % IR SOLN
Status: DC | PRN
  Administered 2015-03-24: 1000 mL

## 2015-03-24 MED ORDER — FENTANYL CITRATE 0.05 MG/ML IJ SOLN
INTRAMUSCULAR | Status: DC | PRN
  Administered 2015-03-24 (×8): 50 ug via INTRAVENOUS

## 2015-03-24 MED ORDER — LOSARTAN POTASSIUM 50 MG PO TABS
50.0000 mg | ORAL_TABLET | Freq: Every day | ORAL | Status: DC
  Administered 2015-03-24 – 2015-03-25 (×2): 50 mg via ORAL
  Filled 2015-03-24 (×2): qty 1

## 2015-03-24 MED ORDER — EPHEDRINE SULFATE 50 MG/ML IJ SOLN
INTRAMUSCULAR | Status: DC | PRN
  Administered 2015-03-24: 10 mg via INTRAVENOUS
  Administered 2015-03-24: 5 mg via INTRAVENOUS
  Administered 2015-03-24: 10 mg via INTRAVENOUS

## 2015-03-24 MED ORDER — STERILE WATER FOR INJECTION IJ SOLN
INTRAMUSCULAR | Status: AC
  Filled 2015-03-24: qty 10

## 2015-03-24 MED ORDER — HYDROMORPHONE HCL 2 MG PO TABS
2.0000 mg | ORAL_TABLET | ORAL | Status: DC | PRN
  Administered 2015-03-24 – 2015-03-25 (×3): 4 mg via ORAL
  Filled 2015-03-24 (×2): qty 2

## 2015-03-24 MED ORDER — LACTATED RINGERS IV SOLN
INTRAVENOUS | Status: DC | PRN
  Administered 2015-03-24 (×2): via INTRAVENOUS

## 2015-03-24 MED ORDER — DEXTROSE-NACL 5-0.45 % IV SOLN
INTRAVENOUS | Status: DC
  Administered 2015-03-24 – 2015-03-25 (×2): via INTRAVENOUS

## 2015-03-24 MED ORDER — PROMETHAZINE HCL 25 MG/ML IJ SOLN: 6.2500 mg | INTRAMUSCULAR | Status: DC | PRN

## 2015-03-24 MED ORDER — PROPOFOL 10 MG/ML IV BOLUS
INTRAVENOUS | Status: AC
  Filled 2015-03-24: qty 20

## 2015-03-24 MED ORDER — LOSARTAN POTASSIUM-HCTZ 50-12.5 MG PO TABS: 1.0000 | ORAL_TABLET | Freq: Every day | ORAL | Status: DC

## 2015-03-24 MED ORDER — MIDAZOLAM HCL 2 MG/2ML IJ SOLN
INTRAMUSCULAR | Status: AC
  Filled 2015-03-24: qty 2

## 2015-03-24 MED ORDER — LIDOCAINE HCL (CARDIAC) 20 MG/ML IV SOLN
INTRAVENOUS | Status: AC
  Filled 2015-03-24: qty 10

## 2015-03-24 MED ORDER — PROPOFOL 10 MG/ML IV BOLUS
INTRAVENOUS | Status: DC | PRN
  Administered 2015-03-24: 180 mg via INTRAVENOUS

## 2015-03-24 MED ORDER — LACTATED RINGERS IV SOLN
INTRAVENOUS | Status: DC | PRN
  Administered 2015-03-24: 07:00:00 via INTRAVENOUS

## 2015-03-24 MED ORDER — LIDOCAINE HCL (CARDIAC) 20 MG/ML IV SOLN
INTRAVENOUS | Status: DC | PRN
  Administered 2015-03-24: 50 mg via INTRAVENOUS

## 2015-03-24 MED ORDER — PHENYLEPHRINE HCL 10 MG/ML IJ SOLN
INTRAMUSCULAR | Status: AC
  Filled 2015-03-24: qty 1

## 2015-03-24 MED ORDER — ACETAMINOPHEN 325 MG PO TABS
ORAL_TABLET | ORAL | Status: AC
  Administered 2015-03-24: 975 mg via ORAL
  Filled 2015-03-24: qty 3

## 2015-03-24 MED ORDER — HYDROMORPHONE HCL 1 MG/ML IJ SOLN
INTRAMUSCULAR | Status: AC
  Administered 2015-03-24: 0.5 mg via INTRAVENOUS
  Filled 2015-03-24: qty 1

## 2015-03-24 MED ORDER — HYDROCHLOROTHIAZIDE 12.5 MG PO CAPS
12.5000 mg | ORAL_CAPSULE | Freq: Every day | ORAL | Status: DC
  Administered 2015-03-24 – 2015-03-25 (×2): 12.5 mg via ORAL
  Filled 2015-03-24 (×2): qty 1

## 2015-03-24 MED ORDER — VENLAFAXINE HCL ER 37.5 MG PO CP24
37.5000 mg | ORAL_CAPSULE | Freq: Every day | ORAL | Status: DC
  Administered 2015-03-25: 37.5 mg via ORAL
  Filled 2015-03-24 (×2): qty 1

## 2015-03-24 MED ORDER — ROCURONIUM BROMIDE 100 MG/10ML IV SOLN
INTRAVENOUS | Status: DC | PRN
  Administered 2015-03-24: 40 mg via INTRAVENOUS

## 2015-03-24 MED ORDER — GLYCOPYRROLATE 0.2 MG/ML IJ SOLN
INTRAMUSCULAR | Status: AC
  Filled 2015-03-24: qty 3

## 2015-03-24 MED ORDER — MIDAZOLAM HCL 5 MG/5ML IJ SOLN
INTRAMUSCULAR | Status: DC | PRN
  Administered 2015-03-24: 2 mg via INTRAVENOUS

## 2015-03-24 MED ORDER — TAMOXIFEN CITRATE 10 MG PO TABS
20.0000 mg | ORAL_TABLET | Freq: Every day | ORAL | Status: DC
  Administered 2015-03-25: 20 mg via ORAL
  Filled 2015-03-24 (×2): qty 2

## 2015-03-24 MED ORDER — EPHEDRINE SULFATE 50 MG/ML IJ SOLN
INTRAMUSCULAR | Status: AC
  Filled 2015-03-24: qty 1

## 2015-03-24 MED ORDER — ONDANSETRON HCL 4 MG/2ML IJ SOLN
INTRAMUSCULAR | Status: AC
  Filled 2015-03-24: qty 2

## 2015-03-24 MED ORDER — NEOSTIGMINE METHYLSULFATE 10 MG/10ML IV SOLN
INTRAVENOUS | Status: DC | PRN
  Administered 2015-03-24: 3 mg via INTRAVENOUS

## 2015-03-24 MED ORDER — ARTIFICIAL TEARS OP OINT
TOPICAL_OINTMENT | OPHTHALMIC | Status: AC
  Filled 2015-03-24: qty 3.5

## 2015-03-24 MED ORDER — ENOXAPARIN SODIUM 40 MG/0.4ML ~~LOC~~ SOLN
40.0000 mg | SUBCUTANEOUS | Status: DC
  Administered 2015-03-25: 40 mg via SUBCUTANEOUS
  Filled 2015-03-24: qty 0.4

## 2015-03-24 MED ORDER — ONDANSETRON HCL 4 MG/2ML IJ SOLN
INTRAMUSCULAR | Status: DC | PRN
  Administered 2015-03-24: 4 mg via INTRAVENOUS

## 2015-03-24 MED ORDER — ALBUMIN HUMAN 5 % IV SOLN
INTRAVENOUS | Status: DC | PRN
  Administered 2015-03-24 (×2): via INTRAVENOUS

## 2015-03-24 MED ORDER — DIPHENHYDRAMINE HCL 50 MG/ML IJ SOLN
INTRAMUSCULAR | Status: DC | PRN
  Administered 2015-03-24: 12.5 mg via INTRAVENOUS

## 2015-03-24 MED ORDER — HYDROMORPHONE HCL 2 MG PO TABS
ORAL_TABLET | ORAL | Status: AC
  Administered 2015-03-24: 4 mg via ORAL
  Filled 2015-03-24: qty 2

## 2015-03-24 MED ORDER — METHOCARBAMOL 500 MG PO TABS
ORAL_TABLET | ORAL | Status: AC
  Administered 2015-03-24: 500 mg via ORAL
  Filled 2015-03-24: qty 1

## 2015-03-24 MED ORDER — DIPHENHYDRAMINE HCL 50 MG/ML IJ SOLN
INTRAMUSCULAR | Status: AC
  Filled 2015-03-24: qty 1

## 2015-03-24 MED ORDER — ROCURONIUM BROMIDE 50 MG/5ML IV SOLN
INTRAVENOUS | Status: AC
  Filled 2015-03-24: qty 1

## 2015-03-24 MED ORDER — CEFAZOLIN SODIUM 1 G IJ SOLR
INTRAMUSCULAR | Status: AC
Start: 2015-03-24 — End: 2015-03-24
  Administered 2015-03-24: 08:00:00
  Filled 2015-03-24: qty 1

## 2015-03-24 MED ORDER — 0.9 % SODIUM CHLORIDE (POUR BTL) OPTIME
TOPICAL | Status: DC | PRN
  Administered 2015-03-24 (×4): 1000 mL

## 2015-03-24 SURGICAL SUPPLY — 60 items
ATCH SMKEVC FLXB CAUT HNDSWH (FILTER) ×2 IMPLANT
BAG DECANTER FOR FLEXI CONT (MISCELLANEOUS) ×3 IMPLANT
BINDER BREAST XLRG (GAUZE/BANDAGES/DRESSINGS) ×3 IMPLANT
BIOPATCH RED 1 DISK 7.0 (GAUZE/BANDAGES/DRESSINGS) ×3 IMPLANT
CANISTER SUCTION 2500CC (MISCELLANEOUS) ×3 IMPLANT
CHLORAPREP W/TINT 26ML (MISCELLANEOUS) ×3 IMPLANT
COVER SURGICAL LIGHT HANDLE (MISCELLANEOUS) ×3 IMPLANT
DERMABOND ADVANCED (GAUZE/BANDAGES/DRESSINGS) ×2
DERMABOND ADVANCED .7 DNX12 (GAUZE/BANDAGES/DRESSINGS) ×4 IMPLANT
DRAIN CHANNEL 19F RND (DRAIN) ×3 IMPLANT
DRAPE ORTHO SPLIT 77X108 STRL (DRAPES) ×2
DRAPE PROXIMA HALF (DRAPES) ×6 IMPLANT
DRAPE SURG 17X23 STRL (DRAPES) ×12 IMPLANT
DRAPE SURG ORHT 6 SPLT 77X108 (DRAPES) ×4 IMPLANT
DRAPE WARM FLUID 44X44 (DRAPE) ×3 IMPLANT
DRSG PAD ABDOMINAL 8X10 ST (GAUZE/BANDAGES/DRESSINGS) ×6 IMPLANT
DRSG SORBAVIEW 3.5X5-5/16 MED (GAUZE/BANDAGES/DRESSINGS) ×3 IMPLANT
ELECT CAUTERY BLADE 6.4 (BLADE) ×3 IMPLANT
ELECT REM PT RETURN 9FT ADLT (ELECTROSURGICAL) ×3
ELECTRODE REM PT RTRN 9FT ADLT (ELECTROSURGICAL) ×2 IMPLANT
EVACUATOR SILICONE 100CC (DRAIN) ×3 IMPLANT
EVACUATOR SMOKE ACCUVAC VALLEY (FILTER) ×1
GAUZE SPONGE 4X4 12PLY STRL (GAUZE/BANDAGES/DRESSINGS) IMPLANT
GLOVE BIO SURGEON STRL SZ7 (GLOVE) ×3 IMPLANT
GLOVE BIO SURGEON STRL SZ7.5 (GLOVE) ×6 IMPLANT
GLOVE BIOGEL PI IND STRL 7.0 (GLOVE) ×4 IMPLANT
GLOVE BIOGEL PI IND STRL 7.5 (GLOVE) ×2 IMPLANT
GLOVE BIOGEL PI IND STRL 8 (GLOVE) ×2 IMPLANT
GLOVE BIOGEL PI INDICATOR 7.0 (GLOVE) ×2
GLOVE BIOGEL PI INDICATOR 7.5 (GLOVE) ×1
GLOVE BIOGEL PI INDICATOR 8 (GLOVE) ×1
GLOVE SURG SS PI 7.0 STRL IVOR (GLOVE) ×6 IMPLANT
GOWN STRL REUS W/ TWL LRG LVL3 (GOWN DISPOSABLE) ×4 IMPLANT
GOWN STRL REUS W/ TWL XL LVL3 (GOWN DISPOSABLE) ×2 IMPLANT
GOWN STRL REUS W/TWL 2XL LVL3 (GOWN DISPOSABLE) ×3 IMPLANT
GOWN STRL REUS W/TWL LRG LVL3 (GOWN DISPOSABLE) ×2
GOWN STRL REUS W/TWL XL LVL3 (GOWN DISPOSABLE) ×1
IMPL SALINE 750CC (Breast) ×2 IMPLANT
IMPLANT SALINE 750CC (Breast) ×3 IMPLANT
KIT BASIN OR (CUSTOM PROCEDURE TRAY) ×3 IMPLANT
KIT ROOM TURNOVER OR (KITS) ×3 IMPLANT
MARKER SKIN DUAL TIP RULER LAB (MISCELLANEOUS) ×3 IMPLANT
NS IRRIG 1000ML POUR BTL (IV SOLUTION) ×6 IMPLANT
PACK GENERAL/GYN (CUSTOM PROCEDURE TRAY) ×3 IMPLANT
PAD ARMBOARD 7.5X6 YLW CONV (MISCELLANEOUS) ×3 IMPLANT
PREFILTER EVAC NS 1 1/3-3/8IN (MISCELLANEOUS) ×3 IMPLANT
SPONGE LAP 18X18 X RAY DECT (DISPOSABLE) ×3 IMPLANT
STRIP CLOSURE SKIN 1/2X4 (GAUZE/BANDAGES/DRESSINGS) IMPLANT
SUT ETHIBOND 2 0 SH (SUTURE) IMPLANT
SUT MNCRL AB 3-0 PS2 18 (SUTURE) ×12 IMPLANT
SUT MNCRL AB 4-0 PS2 18 (SUTURE) ×6 IMPLANT
SUT MON AB 2-0 CT1 36 (SUTURE) ×3 IMPLANT
SUT PDS 2 0 CTB 1 36 (SUTURE) IMPLANT
SUT PROLENE 3 0 PS 2 (SUTURE) ×3 IMPLANT
SUT PROLENE 5 0 PS 2 (SUTURE) ×6 IMPLANT
SUT VIC AB 3-0 SH 18 (SUTURE) ×3 IMPLANT
SYR BULB IRRIGATION 50ML (SYRINGE) ×3 IMPLANT
TOWEL OR 17X24 6PK STRL BLUE (TOWEL DISPOSABLE) ×3 IMPLANT
TOWEL OR 17X26 10 PK STRL BLUE (TOWEL DISPOSABLE) ×3 IMPLANT
TUBE CONNECTING 12X1/4 (SUCTIONS) ×3 IMPLANT

## 2015-03-24 NOTE — Transfer of Care (Signed)
Immediate Anesthesia Transfer of Care Note  Patient: Diana Deleon  Procedure(s) Performed: Procedure(s): REMOVAL OF RIGHT BREAST TISSUE EXPANDER AND DELAYED BREAST RECONSTRUCTION WITH PLACEMENT OF IMPLANT (Right) LEFT BREAST REDUCTION  (BREAST) (Left)  Patient Location: PACU  Anesthesia Type:General  Level of Consciousness: sedated  Airway & Oxygen Therapy: Patient Spontanous Breathing and Patient connected to face mask oxygen  Post-op Assessment: Report given to RN and Post -op Vital signs reviewed and stable  Post vital signs: Reviewed and stable  Last Vitals:  Filed Vitals:   03/24/15 0625  BP: 146/74  Pulse: 69  Temp: 37.1 C  Resp: 18    Complications: No apparent anesthesia complications

## 2015-03-24 NOTE — H&P (Signed)
I have re-examined and re-evaluated the patient and there are no changes. See H&P in paper chart.  Planned procedure: Right breast delayed reconstruction and left breast reduction.

## 2015-03-24 NOTE — Anesthesia Procedure Notes (Signed)
Procedure Name: Intubation Date/Time: 03/24/2015 7:42 AM Performed by: Maude Leriche D Pre-anesthesia Checklist: Patient identified, Emergency Drugs available, Suction available, Patient being monitored and Timeout performed Patient Re-evaluated:Patient Re-evaluated prior to inductionOxygen Delivery Method: Circle system utilized Preoxygenation: Pre-oxygenation with 100% oxygen Intubation Type: IV induction Ventilation: Mask ventilation without difficulty Laryngoscope Size: Miller and 2 Grade View: Grade I Tube type: Oral Tube size: 7.0 mm Number of attempts: 1 Airway Equipment and Method: Stylet Placement Confirmation: ETT inserted through vocal cords under direct vision,  positive ETCO2 and breath sounds checked- equal and bilateral Secured at: 21 cm Tube secured with: Tape Dental Injury: Teeth and Oropharynx as per pre-operative assessment

## 2015-03-24 NOTE — Anesthesia Postprocedure Evaluation (Signed)
  Anesthesia Post-op Note  Patient: Diana Deleon  Procedure(s) Performed: Procedure(s): REMOVAL OF RIGHT BREAST TISSUE EXPANDER AND DELAYED BREAST RECONSTRUCTION WITH PLACEMENT OF IMPLANT (Right) LEFT BREAST REDUCTION  (BREAST) (Left)  Patient Location: PACU  Anesthesia Type:General  Level of Consciousness: awake  Airway and Oxygen Therapy: Patient Spontanous Breathing  Post-op Pain: mild [id number]-op Assessment: Post-op Vital signs reviewed  Post-op Vital Signs: Reviewed  Last Vitals:  Filed Vitals:   03/24/15 1500  BP: 139/73  Pulse: 104  Temp: 36.7 C  Resp: 16    Complications: No apparent anesthesia complications

## 2015-03-24 NOTE — Anesthesia Preprocedure Evaluation (Addendum)
Anesthesia Evaluation  Patient identified by MRN, date of birth, ID band Patient awake    Reviewed: Allergy & Precautions, NPO status , Patient's Chart, lab work & pertinent test results  Airway Mallampati: II  TM Distance: >3 FB Neck ROM: Full    Dental  (+) Teeth Intact, Dental Advisory Given   Pulmonary neg pulmonary ROS,          Cardiovascular hypertension,     Neuro/Psych    GI/Hepatic Neg liver ROS, GERD-  ,  Endo/Other  Hypothyroidism   Renal/GU Renal disease     Musculoskeletal   Abdominal   Peds  Hematology   Anesthesia Other Findings   Reproductive/Obstetrics                           Anesthesia Physical Anesthesia Plan  ASA: III  Anesthesia Plan: General   Post-op Pain Management:    Induction: Intravenous  Airway Management Planned: Oral ETT  Additional Equipment:   Intra-op Plan:   Post-operative Plan: Extubation in OR  Informed Consent: I have reviewed the patients History and Physical, chart, labs and discussed the procedure including the risks, benefits and alternatives for the proposed anesthesia with the patient or authorized representative who has indicated his/her understanding and acceptance.   Dental advisory given  Plan Discussed with: CRNA, Anesthesiologist and Surgeon  Anesthesia Plan Comments:        Anesthesia Quick Evaluation

## 2015-03-24 NOTE — Brief Op Note (Signed)
03/24/2015  10:48 AM  PATIENT:  Janann August  54 y.o. female  PRE-OPERATIVE DIAGNOSIS:  RIGHT BREAST CANCER  POST-OPERATIVE DIAGNOSIS:  RIGHT BREAST CANCER  PROCEDURE:  Procedure(s): REMOVAL OF RIGHT BREAST TISSUE EXPANDER AND DELAYED BREAST RECONSTRUCTION WITH PLACEMENT OF IMPLANT (Right) LEFT BREAST REDUCTION  (BREAST) (Left)  SURGEON:  Surgeon(s) and Role:    * Crissie Reese, MD - Primary  PHYSICIAN ASSISTANT:   ASSISTANTS: none   ANESTHESIA:   general  EBL:  Total I/O In: 2000 [I.V.:1500; IV Piggyback:500] Out: 75 [Blood:75]  BLOOD ADMINISTERED:none  DRAINS: (1) Jackson-Pratt drain(s) with closed bulb suction in the right chest space   LOCAL MEDICATIONS USED:  NONE  SPECIMEN:  Source of Specimen:  left breast tissue  DISPOSITION OF SPECIMEN:  PATHOLOGY  COUNTS:  YES  TOURNIQUET:  * No tourniquets in log *  DICTATION: .Other Dictation: Dictation Number [id number]  PLAN OF CARE: Admit for overnight observation  PATIENT DISPOSITION:  PACU - hemodynamically stable.   Delay start of Pharmacological VTE agent (>24hrs) due to surgical blood loss or risk of bleeding: no

## 2015-03-24 NOTE — Op Note (Signed)
Diana Deleon, Diana Deleon                 ACCOUNT NO.:  [id number]  MEDICAL RECORD NO.:  [id number]  LOCATION:  6N29C                        FACILITY:  [facility]  PHYSICIAN:  Crissie Reese, M.D.     DATE OF BIRTH:  [birth year]  DATE OF PROCEDURE:  03/24/2015 DATE OF DISCHARGE:                              OPERATIVE REPORT   PREOPERATIVE DIAGNOSIS:  Breast cancer.  POSTOPERATIVE DIAGNOSIS:  Breast cancer.  PROCEDURE PERFORMED: 1. Delayed breast reconstruction of right breast with removal of     tissue expander and placement of saline implants. 2. Left breast reduction.  SURGEON:  Crissie Reese, M.D.  ANESTHESIA:  General.  ESTIMATED BLOOD LOSS:  125 mL.  DRAINS:  One 19-French on the right side.  CLINICAL NOTE:  A 54 year old woman has had breast cancer and has had mastectomy on the right with her placement of tissue expander.  She now presents with staged procedure to remove the tissue expander and perform the delayed breast reconstruction with saline implant.  In addition, the left breast is larger and for symmetry she desired a breast reduction. The nature of these procedures and risks plus complications were discussed with her in detail.  These risks included, but were not limited to, bleeding, infection, healing problems, scarring, loss of sensation, fluid accumulations, loss of sensation in the nipple complex, anesthesia complications, DVT, PE, failure of the device, capsular contracture, displacement of device, wrinkles, ripples, contour deformities at the periphery of the reconstruction, loss of tissue, loss of the nipple, loss of skin, loss of the deeper fatty tissue, asymmetry, chronic pain, and pneumothorax as well as overall disappointment.  She understood all this and wished to proceed.  DESCRIPTION OF PROCEDURE:  The patient was placed in a full upright position in the holding area and marked with a breast reduction and for the reconstruction right side.  She was then  taken the operating placed supine. After successful induction of general anesthesia, she is prepped with ChloraPrep and after waiting full 3 minutes for drying, she was draped with sterile drapes.  The right side was approached first for the breast reconstruction.  The old mastectomy scar was utilized and dissection carried into the subcutaneous tissue and a muscle-splitting incision was used to access the implant.  The implant was deflated and gently removed.  The space was inspected and noted to be in good condition.  No significant abnormalities were noted.  The space was irrigated thoroughly with saline as well as antibiotic solution and the implant was prepared by soaking in antibiotic solution.  After thoroughly cleaning gloves, the implant was prepared placing 100 mL of sterile saline using a closed filling system and then returning the implant to the antibiotic solution.  A 19-French drain was positioned, brought through separate stab wound inferolaterally and secured with a 3- 0 Prolene suture and 3-0 Vicryl sutures were pre-placed for the deep muscle closure but left untied.  After thoroughly cleaning gloves, the implant was then positioned after placing antibiotic solution again in the space.  The implant was then filled to the maximum 900 mL of saline, again using closed filling system.  Antibiotic solution placed on top of the implant and  the 3-0 Vicryl sutures were tied securely.  The 62- French drain was secured with 3-0 Prolene suture.  The wound was irrigated with antibiotic solution and the skin was closed with 3-0 Monocryl interrupted inverted deep dermal sutures and running 3-0 Monocryl subcuticular suture.  Attention was then directed to the left breast where 38 mm marker marked the nipple-areolar complex and  an 8 cm wide inferior nipple-areolar pedicle was designed.  Incisions were made and the inferior nipple- areolar pedicle was de-epithelialized.  The pedicle  was then isolated from surrounding tissue beveling in outward direction medial and lateral order to ensure a much broader attachment at the level of the chest wall, then with present to level of the skin.  This was done in order to preserve blood supply to the nipple.  The resections were then performed medial, central, and lateral and total of 339 g resected.  Meticulous stasis with electrocautery.  Thorough irrigation with saline and again hemostasis was performed using electrocautery.  Excellent hemostasis having been ensured, the closure with 2-0 Monocryl interrupted inverted deep sutures, and 3-0 Monocryl in inverted interrupted inverted deep dermal sutures and 3-0 Monocryl running subcuticular suture.  The measurements were taken 5 cm up from the mammary crease and a 38 mm marker marked at new site for the nipple-areolar complex.  This tissue was resected.  The nipple complex brought through this opening and was inspected.  Nipple complex was found to have excellent color with bright red bleeding at its periphery consistent with viability.  Thorough irrigation with antibiotic solution and hemostasis with electrocautery. The nipple-areolar insetting with 3-0 Monocryl interrupted inverted deep dermal sutures and running 4-0 Monocryl subcuticular suture.  Dermabond was applied.  Dry sterile dressing in the chest vest position, and she was transferred to the recovery room stable having tolerated procedure well.     Crissie Reese, M.D.     DB/MEDQ  D:  03/24/2015  T:  03/24/2015  Job:  [id number]

## 2015-03-25 ENCOUNTER — Encounter (HOSPITAL_COMMUNITY): Payer: Self-pay | Admitting: Plastic Surgery

## 2015-03-25 DIAGNOSIS — Z421 Encounter for breast reconstruction following mastectomy: Secondary | ICD-10-CM | POA: Diagnosis not present

## 2015-03-25 LAB — ESTRADIOL, ULTRA SENS: Estradiol, Ultra Sensitive: 7 pg/mL

## 2015-03-25 MED ORDER — HYDROMORPHONE HCL 2 MG PO TABS: 2.0000 mg | ORAL_TABLET | ORAL | Status: DC | PRN

## 2015-03-25 MED ORDER — CEPHALEXIN 500 MG PO CAPS
500.0000 mg | ORAL_CAPSULE | Freq: Three times a day (TID) | ORAL | Status: DC
  Administered 2015-03-25: 500 mg via ORAL
  Filled 2015-03-25: qty 1

## 2015-03-25 MED ORDER — ENOXAPARIN SODIUM 40 MG/0.4ML ~~LOC~~ SOLN: 40.0000 mg | SUBCUTANEOUS | Status: DC

## 2015-03-25 MED ORDER — CEPHALEXIN 500 MG PO CAPS: 500.0000 mg | ORAL_CAPSULE | Freq: Three times a day (TID) | ORAL | Status: DC

## 2015-03-25 MED ORDER — METHOCARBAMOL 500 MG PO TABS: 500.0000 mg | ORAL_TABLET | Freq: Four times a day (QID) | ORAL | Status: DC | PRN

## 2015-03-25 NOTE — Progress Notes (Signed)
Discharge paperwork reviewed with patient. Prescription given by MD to patient. No questions verbalized. Patient ready for discharge.

## 2015-03-25 NOTE — Discharge Instructions (Addendum)
No lifting for 6 weeks No vigorous activity for 6 weeks (including outdoor walks) No driving for 4 weeks OK to walk up stairs slowly Stay propped up Use incentive spirometer at home every hour while awake No shower while drains are in place Empty drains at least three times a day and record the amounts separately Change drain dressings every third day when instructed to do so by Dr. Harlow Mares  Apply Bacitracin antibiotic ointment to the drain sites  Place gauze dressing over drains  Secure the gauze with tape Take an over-the-counter Probiotic while on antibiotics Take an over-the-counter stool softener (such as Colace) while on pain medication See Dr. Harlow Mares in office next week For questions call [phone] or [phone]

## 2015-03-25 NOTE — Discharge Summary (Signed)
Physician Discharge Summary  Patient ID: Diana Deleon MRN: [id number] DOB/AGE: 07-06-1961 54 y.o.  Admit date: 03/24/2015 Discharge date: 03/25/2015  Admission Diagnoses: Breast Cancer  Discharge Diagnoses: Same Active Problems:   Breast cancer, right breast   Discharged Condition: good  Hospital Course: On the day of admission the patient was taken to surgery and had right delayed breast reconstruction with implant and left breast reduction. The patient tolerated the procedures well. Postoperatively, the left nipple complex maintained excellent color and capillary refill. The patient was ambulatory and tolerating diet on the first postoperative day. She is ready for discharge.  Treatments: antibiotics: Ancef, anticoagulation: LMW heparin and surgery: right breast reconstruction with saline implant and left breast reduction  Discharge Exam: Blood pressure 133/83, pulse 75, temperature 98.7 F (37.1 C), temperature source Oral, resp. rate 17, height 5\' 4"  (1.626 m), weight 213 lb (96.616 kg), SpO2 98 %.  Operative sites: Chest is soft bilateral. No evidence of bleeding or infection. Implant in good position. Drain functioning. Drainage thin.  Disposition: 01-Home or Self Care     Medication List    TAKE these medications        acetaminophen 500 MG tablet  Commonly known as:  TYLENOL  Take 1,000 mg by mouth every 6 (six) hours as needed for mild pain or moderate pain.     cephALEXin 500 MG capsule  Commonly known as:  KEFLEX  Take 1 capsule (500 mg total) by mouth every 8 (eight) hours.     DSS 100 MG Caps  Take 100 mg by mouth daily.     enoxaparin 40 MG/0.4ML injection  Commonly known as:  LOVENOX  Inject 0.4 mLs (40 mg total) into the skin daily.     HYDROmorphone 2 MG tablet  Commonly known as:  DILAUDID  Take 1-2 tablets (2-4 mg total) by mouth every 4 (four) hours as needed for moderate pain.     levothyroxine 25 MCG tablet  Commonly known as:  SYNTHROID,  LEVOTHROID  TAKE 1 TABLET BY MOUTH DAILY BEFORE BREAKFAST.     losartan-hydrochlorothiazide 50-12.5 MG per tablet  Commonly known as:  HYZAAR  Take 1 tablet by mouth daily.     methocarbamol 500 MG tablet  Commonly known as:  ROBAXIN  Take 1 tablet (500 mg total) by mouth every 6 (six) hours as needed for muscle spasms.     omeprazole 20 MG tablet  Commonly known as:  PRILOSEC OTC  Take 20 mg by mouth daily before breakfast.     tamoxifen 20 MG tablet  Commonly known as:  NOLVADEX  Take 1 tablet (20 mg total) by mouth daily.     venlafaxine XR 37.5 MG 24 hr capsule  Commonly known as:  EFFEXOR XR  Take 1 capsule (37.5 mg total) by mouth daily with breakfast.         Signed: Chayah Mckee M 03/25/2015, 8:44 AM

## 2015-03-28 ENCOUNTER — Ambulatory Visit: Payer: Self-pay | Admitting: Hematology and Oncology

## 2015-03-28 ENCOUNTER — Other Ambulatory Visit: Payer: Self-pay | Admitting: Hematology and Oncology

## 2015-03-28 DIAGNOSIS — C50411 Malignant neoplasm of upper-outer quadrant of right female breast: Secondary | ICD-10-CM

## 2015-03-28 MED ORDER — ANASTROZOLE 1 MG PO TABS: 1.0000 mg | ORAL_TABLET | Freq: Every day | ORAL | Status: DC

## 2015-06-24 ENCOUNTER — Other Ambulatory Visit: Payer: Self-pay | Admitting: *Deleted

## 2015-06-24 DIAGNOSIS — C50411 Malignant neoplasm of upper-outer quadrant of right female breast: Secondary | ICD-10-CM

## 2015-06-26 NOTE — Assessment & Plan Note (Signed)
Right breast invasive ductal carcinoma 2.8 cm status post mastectomy grade 3 with high-grade DCIS, ALH and LCIS; separate tumor which was DCIS 1.5 cm, 1 SLN negative, ER 86%, PR 95%, HER-2 negative ratio 1.52, Ki-67 27% T2 N0 M0 stage II a, Oncotype Dx score 18 (11% ROR) Low risk; started tamoxifen 12/27/2014  Tamoxifen Toxicities: 1. Severe hot flashes On Effexor FSH: 63, Estradiol: 7: Patient is post-menopausal and hence we can start anastrozole.  Breast Cancer Surveillance: 1. Breast exam 06/28/15 Normal 2. Mammogram To be done Aug 2016. Annually  RTC in 6 months for follow up

## 2015-06-27 ENCOUNTER — Ambulatory Visit (HOSPITAL_BASED_OUTPATIENT_CLINIC_OR_DEPARTMENT_OTHER): Payer: 59 | Admitting: Hematology and Oncology

## 2015-06-27 ENCOUNTER — Encounter: Payer: Self-pay | Admitting: Hematology and Oncology

## 2015-06-27 ENCOUNTER — Other Ambulatory Visit (HOSPITAL_BASED_OUTPATIENT_CLINIC_OR_DEPARTMENT_OTHER): Payer: 59

## 2015-06-27 ENCOUNTER — Telehealth: Payer: Self-pay | Admitting: Hematology and Oncology

## 2015-06-27 VITALS — BP 154/87 | HR 84 | Temp 98.5°F | Resp 18 | Ht 64.0 in | Wt 218.7 lb

## 2015-06-27 DIAGNOSIS — C50411 Malignant neoplasm of upper-outer quadrant of right female breast: Secondary | ICD-10-CM

## 2015-06-27 DIAGNOSIS — Z853 Personal history of malignant neoplasm of breast: Secondary | ICD-10-CM

## 2015-06-27 LAB — COMPREHENSIVE METABOLIC PANEL (CC13)
ALBUMIN: 3.5 g/dL (ref 3.5–5.0)
ALT: 22 U/L (ref 0–55)
AST: 19 U/L (ref 5–34)
Alkaline Phosphatase: 88 U/L (ref 40–150)
Anion Gap: 7 mEq/L (ref 3–11)
BUN: 15.1 mg/dL (ref 7.0–26.0)
CALCIUM: 9.3 mg/dL (ref 8.4–10.4)
CHLORIDE: 106 meq/L (ref 98–109)
CO2: 30 mEq/L — ABNORMAL HIGH (ref 22–29)
Creatinine: 0.9 mg/dL (ref 0.6–1.1)
EGFR: 75 mL/min/{1.73_m2} — ABNORMAL LOW (ref >90)
GLUCOSE: 123 mg/dL (ref 70–140)
Potassium: 3.5 mEq/L (ref 3.5–5.1)
SODIUM: 142 meq/L (ref 136–145)
Total Bilirubin: 0.45 mg/dL (ref 0.20–1.20)
Total Protein: 6.8 g/dL (ref 6.4–8.3)

## 2015-06-27 LAB — CBC WITH DIFFERENTIAL/PLATELET
BASO%: 0.6 % (ref 0.0–2.0)
Basophils Absolute: 0 10*3/uL (ref 0.0–0.1)
EOS%: 3.3 % (ref 0.0–7.0)
Eosinophils Absolute: 0.2 10*3/uL (ref 0.0–0.5)
HCT: 41.1 % (ref 34.8–46.6)
HGB: 14.1 g/dL (ref 11.6–15.9)
LYMPH#: 1.6 10*3/uL (ref 0.9–3.3)
LYMPH%: 29.1 % (ref 14.0–49.7)
MCH: 30.2 pg (ref 25.1–34.0)
MCHC: 34.2 g/dL (ref 31.5–36.0)
MCV: 88.4 fL (ref 79.5–101.0)
MONO#: 0.3 10*3/uL (ref 0.1–0.9)
MONO%: 6.5 % (ref 0.0–14.0)
NEUT#: 3.3 10*3/uL (ref 1.5–6.5)
NEUT%: 60.5 % (ref 38.4–76.8)
PLATELETS: 233 10*3/uL (ref 145–400)
RBC: 4.65 10*6/uL (ref 3.70–5.45)
RDW: 13.3 % (ref 11.2–14.5)
WBC: 5.4 10*3/uL (ref 3.9–10.3)

## 2015-06-27 NOTE — Progress Notes (Signed)
Patient Care Team: Abner Greenspan, MD as PCP - General Nicholas Lose, MD as Consulting Physician (Hematology and Oncology) Excell Seltzer, MD as Consulting Physician (General Surgery) Eppie Gibson, MD as Attending Physician (Radiation Oncology) Holley Bouche, NP as Nurse Practitioner (Nurse Practitioner)  DIAGNOSIS: Breast cancer of upper-outer quadrant of right female breast   Staging form: Breast, AJCC 7th Edition     Clinical: Stage IIA (T2, N0, M0) - Unsigned       Staging comments: Staged at breast conference on 11.11.15      Pathologic stage from 11/25/2014: Stage IIA (T2(2), N0, cM0) - Unsigned       Staging comments: Staged from final excisional pathology from Dr. Donato Heinz    SUMMARY OF ONCOLOGIC HISTORY:   Breast cancer of upper-outer quadrant of right female breast   10/20/2014 Initial Diagnosis Right breast invasive and in situ mammary cancer grade 2 E-cadherin positive, invasive ductal carcinoma with DCIS focal atypical cells negative for atypia and ER 70% PR 80% HER-2 negative ratio 1.26 Ki-67 27%   10/22/2014 Breast MRI Right breast upper outer quadrant 2.3 cm biopsy-proven malignancy   11/25/2014 Surgery Right simple mastectomy: Tumor #1 upper lateral invasive high-grade ductal carcinoma 2.8 cm plus high-grade DCIS plus ALH and LCIS; tumor #2 upper medial DCIS 1.5 cm. Margins negative.   11/25/2014 Oncotype testing 18 (11% 10-year risk of distant recurrence on Tamoxifen alone).    01/03/2015 -  Anti-estrogen oral therapy Tamoxifen  20 mg daily discontinued 03/28/2015 and switched to anastrozole 1 mg daily when she was found to be postmenopausal. Plan treatment duration 5 years   03/24/2015 Surgery Left breast mammoplasty: Benign    CHIEF COMPLIANT: follow-up on anastrozole  INTERVAL HISTORY: Diana Deleon is a 54 year old with above-mentioned history right breast cancer currently on anastrozole. She was found to be postmenopausal on blood work and we switched her from tamoxifen  and anastrozole. Unfortunately the hot flashes are continuing to be a problem. She does not have any improvement in her symptoms with the switch. She takes Effexor for hot flashes. PSV helping her somewhat but continues to have significant hot flashes throughout the day. At night she is only waking up twice.  REVIEW OF SYSTEMS:   Constitutional: Denies fevers, chills or abnormal weight loss, severe hot flashes Eyes: Denies blurriness of vision Ears, nose, mouth, throat, and face: Denies mucositis or sore throat Respiratory: Denies cough, dyspnea or wheezes Cardiovascular: Denies palpitation, chest discomfort or lower extremity swelling Gastrointestinal:  Denies nausea, heartburn or change in bowel habits Skin: Denies abnormal skin rashes Lymphatics: Denies new lymphadenopathy or easy bruising Neurological:Denies numbness, tingling or new weaknesses Behavioral/Psych: Mood is stable, no new changes  Breast:  denies any pain or lumps or nodules in either breasts All other systems were reviewed with the patient and are negative.  I have reviewed the past medical history, past surgical history, social history and family history with the patient and they are unchanged from previous note.  ALLERGIES:  is allergic to norvasc.  MEDICATIONS:  Current Outpatient Prescriptions  Medication Sig Dispense Refill  . anastrozole (ARIMIDEX) 1 MG tablet Take 1 tablet (1 mg total) by mouth daily. 90 tablet 3  . levothyroxine (SYNTHROID, LEVOTHROID) 25 MCG tablet TAKE 1 TABLET BY MOUTH DAILY BEFORE BREAKFAST. 90 tablet 3  . losartan-hydrochlorothiazide (HYZAAR) 50-12.5 MG per tablet Take 1 tablet by mouth daily. 90 tablet 3  . omeprazole (PRILOSEC OTC) 20 MG tablet Take 20 mg by mouth daily before breakfast.     .  venlafaxine XR (EFFEXOR XR) 37.5 MG 24 hr capsule Take 1 capsule (37.5 mg total) by mouth daily with breakfast. 30 capsule 6   No current facility-administered medications for this visit.     PHYSICAL EXAMINATION: ECOG PERFORMANCE STATUS: 1 - Symptomatic but completely ambulatory  Filed Vitals:   06/27/15 1000  BP: 154/87  Pulse: 84  Temp: 98.5 F (36.9 C)  Resp: 18   Filed Weights   06/27/15 1000  Weight: 218 lb 11.2 oz (99.202 kg)    GENERAL:alert, no distress and comfortable SKIN: skin color, texture, turgor are normal, no rashes or significant lesions EYES: normal, Conjunctiva are pink and non-injected, sclera clear OROPHARYNX:no exudate, no erythema and lips, buccal mucosa, and tongue normal  NECK: supple, thyroid normal size, non-tender, without nodularity LYMPH:  no palpable lymphadenopathy in the cervical, axillary or inguinal LUNGS: clear to auscultation and percussion with normal breathing effort HEART: regular rate & rhythm and no murmurs and no lower extremity edema ABDOMEN:abdomen soft, non-tender and normal bowel sounds Musculoskeletal:no cyanosis of digits and no clubbing  NEURO: alert & oriented x 3 with fluent speech, no focal motor/sensory deficits  LABORATORY DATA:  I have reviewed the data as listed   Chemistry      Component Value Date/Time   NA 141 03/17/2015 0927   NA 138 10/27/2014 1236   K 3.7 03/17/2015 0927   K 3.8 10/27/2014 1236   CL 105 03/17/2015 0927   CO2 29 03/17/2015 0927   CO2 29 10/27/2014 1236   BUN 12 03/17/2015 0927   BUN 11.9 10/27/2014 1236   CREATININE 1.08 03/17/2015 0927   CREATININE 0.9 10/27/2014 1236      Component Value Date/Time   CALCIUM 9.3 03/17/2015 0927   CALCIUM 9.3 10/27/2014 1236   ALKPHOS 77 10/27/2014 1236   ALKPHOS 78 07/07/2013 0859   AST 18 10/27/2014 1236   AST 18 07/07/2013 0859   ALT 22 10/27/2014 1236   ALT 19 07/07/2013 0859   BILITOT 0.64 10/27/2014 1236   BILITOT 0.6 07/07/2013 0859       Lab Results  Component Value Date   WBC 5.4 06/27/2015   HGB 14.1 06/27/2015   HCT 41.1 06/27/2015   MCV 88.4 06/27/2015   PLT 233 06/27/2015   NEUTROABS 3.3 06/27/2015     ASSESSMENT & PLAN:  Breast cancer of upper-outer quadrant of right female breast Right breast invasive ductal carcinoma 2.8 cm status post mastectomy grade 3 with high-grade DCIS, ALH and LCIS; separate tumor which was DCIS 1.5 cm, 1 SLN negative, ER 86%, PR 95%, HER-2 negative ratio 1.52, Ki-67 27% T2 N0 M0 stage II a, Oncotype Dx score 18 (11% ROR) Low risk; started tamoxifen 12/27/2014  Switched to anastrozole April 2016  Anastrozole Toxicities: 1. Severe hot flashes On Effexor. In spite of this she is continuing to have severe hot flashes almost every hour. FSH: 63, Estradiol: 7: Patient is post-menopausal  Breast Cancer Surveillance: 1. Breast exam 06/27/15 Normal 2. Mammogram To be done October 2016 on the left breast. then Annually.  RTC in 6 months for follow up   No orders of the defined types were placed in this encounter.   The patient has a good understanding of the overall plan. she agrees with it. she will call with any problems that may develop before the next visit here.   Rulon Eisenmenger, MD

## 2015-06-27 NOTE — Telephone Encounter (Signed)
Gave patient avs report and appointments for January 2017. °

## 2015-08-11 ENCOUNTER — Other Ambulatory Visit: Payer: Self-pay | Admitting: Adult Health

## 2015-09-08 ENCOUNTER — Other Ambulatory Visit: Payer: Self-pay | Admitting: Adult Health

## 2015-10-31 ENCOUNTER — Ambulatory Visit (INDEPENDENT_AMBULATORY_CARE_PROVIDER_SITE_OTHER): Payer: 59 | Admitting: Family Medicine

## 2015-10-31 ENCOUNTER — Encounter: Payer: Self-pay | Admitting: Family Medicine

## 2015-10-31 VITALS — BP 135/85 | HR 76 | Temp 98.1°F | Ht 64.0 in | Wt 225.2 lb

## 2015-10-31 DIAGNOSIS — E6609 Other obesity due to excess calories: Secondary | ICD-10-CM | POA: Insufficient documentation

## 2015-10-31 DIAGNOSIS — E669 Obesity, unspecified: Secondary | ICD-10-CM | POA: Diagnosis not present

## 2015-10-31 DIAGNOSIS — E039 Hypothyroidism, unspecified: Secondary | ICD-10-CM

## 2015-10-31 DIAGNOSIS — C50411 Malignant neoplasm of upper-outer quadrant of right female breast: Secondary | ICD-10-CM

## 2015-10-31 DIAGNOSIS — Z6836 Body mass index (BMI) 36.0-36.9, adult: Secondary | ICD-10-CM

## 2015-10-31 DIAGNOSIS — E66812 Obesity, class 2: Secondary | ICD-10-CM | POA: Insufficient documentation

## 2015-10-31 DIAGNOSIS — I10 Essential (primary) hypertension: Secondary | ICD-10-CM | POA: Diagnosis not present

## 2015-10-31 LAB — LIPID PANEL
CHOL/HDL RATIO: 3
Cholesterol: 174 mg/dL (ref 0–200)
HDL: 67.3 mg/dL (ref >39.00)
LDL Cholesterol: 95 mg/dL (ref 0–99)
NonHDL: 107.12
TRIGLYCERIDES: 62 mg/dL (ref 0.0–149.0)
VLDL: 12.4 mg/dL (ref 0.0–40.0)

## 2015-10-31 LAB — CBC WITH DIFFERENTIAL/PLATELET
BASOS PCT: 0.3 % (ref 0.0–3.0)
Basophils Absolute: 0 10*3/uL (ref 0.0–0.1)
EOS ABS: 0.3 10*3/uL (ref 0.0–0.7)
Eosinophils Relative: 3.7 % (ref 0.0–5.0)
HCT: 45.1 % (ref 36.0–46.0)
Hemoglobin: 14.9 g/dL (ref 12.0–15.0)
LYMPHS ABS: 1.9 10*3/uL (ref 0.7–4.0)
Lymphocytes Relative: 26.8 % (ref 12.0–46.0)
MCHC: 33 g/dL (ref 30.0–36.0)
MCV: 89 fl (ref 78.0–100.0)
MONO ABS: 0.5 10*3/uL (ref 0.1–1.0)
Monocytes Relative: 7 % (ref 3.0–12.0)
NEUTROS ABS: 4.4 10*3/uL (ref 1.4–7.7)
Neutrophils Relative %: 62.2 % (ref 43.0–77.0)
PLATELETS: 256 10*3/uL (ref 150.0–400.0)
RBC: 5.07 Mil/uL (ref 3.87–5.11)
RDW: 13.4 % (ref 11.5–15.5)
WBC: 7.1 10*3/uL (ref 4.0–10.5)

## 2015-10-31 LAB — COMPREHENSIVE METABOLIC PANEL
ALT: 23 U/L (ref 0–35)
AST: 21 U/L (ref 0–37)
Albumin: 4.2 g/dL (ref 3.5–5.2)
Alkaline Phosphatase: 98 U/L (ref 39–117)
BUN: 14 mg/dL (ref 6–23)
CO2: 33 meq/L — AB (ref 19–32)
CREATININE: 0.91 mg/dL (ref 0.40–1.20)
Calcium: 9.7 mg/dL (ref 8.4–10.5)
Chloride: 101 mEq/L (ref 96–112)
GFR: 68.75 mL/min (ref >60.00)
Glucose, Bld: 117 mg/dL — ABNORMAL HIGH (ref 70–99)
Potassium: 4.5 mEq/L (ref 3.5–5.1)
SODIUM: 141 meq/L (ref 135–145)
Total Bilirubin: 0.6 mg/dL (ref 0.2–1.2)
Total Protein: 7.6 g/dL (ref 6.0–8.3)

## 2015-10-31 LAB — TSH: TSH: 3.53 u[IU]/mL (ref 0.35–4.50)

## 2015-10-31 MED ORDER — OMEPRAZOLE MAGNESIUM 20 MG PO TBEC: 20.0000 mg | DELAYED_RELEASE_TABLET | Freq: Every day | ORAL | Status: DC

## 2015-10-31 MED ORDER — LEVOTHYROXINE SODIUM 25 MCG PO TABS: ORAL_TABLET | ORAL | Status: DC

## 2015-10-31 MED ORDER — LOSARTAN POTASSIUM-HCTZ 50-12.5 MG PO TABS: 1.0000 | ORAL_TABLET | Freq: Every day | ORAL | Status: DC

## 2015-10-31 NOTE — Progress Notes (Signed)
Subjective:    Patient ID: Diana Deleon, female    DOB: 15-Oct-1961, 54 y.o.   MRN: [id number]  HPI Here for f/u of chronic health problems   Doing well with breast cancer f/u and is "completely breast cancer free"  Never needed chemo or rad  On Arimidex  Hot flashes are finally under control   Did not talk about bone density tests   Wt is up 7 lb with bmi of 38   Does not have a uterus   Thyroid  Lab Results  Component Value Date   TSH 3.05 11/02/2014    No clinical changes  Due for a check   bp is stable today  No cp or palpitations or headaches or edema  No side effects to medicines  BP Readings from Last 3 Encounters:  10/31/15 144/86  06/27/15 154/87  03/25/15 133/75    She is stressed today -mother is in another room - f/u for a recent CVA She is moving here with her   Patient Active Problem List   Diagnosis Date Noted  . Breast cancer, right breast ([facility]) 11/25/2014  . Breast cancer of upper-outer quadrant of right female breast ([facility]) 10/25/2014  . Viral URI with cough 11/25/2013  . Hyperglycemia 07/14/2013  . Routine general medical examination at a health care facility 10/12/2012  . Hypothyroidism 06/03/2012  . Elevated transaminase level 06/03/2012  . Left foot pain 05/27/2012  . Overactive bladder 05/27/2012  . Dermatitis 05/27/2012  . COLD SORE 07/07/2010  . VERTIGO 08/01/2009  . URINARY URGENCY 07/15/2009  . LOW BACK PAIN, CHRONIC 05/17/2008  . HEADACHE, CHRONIC 05/17/2008  . Essential hypertension 02/12/2008  . DERMATITIS, SEBORRHEIC 02/12/2008   Past Medical History  Diagnosis Date  . History of recurrent UTIs   . History of abnormal Pap smear     has had leep in past  . History of cold sores     has used valtrex in past  . Seborrheic dermatitis     local on R scalp and in ear (failed mult meds- uses steroid spray)  . Intermittent vertigo   . Fibroids     hyst. 2006  . Hypertension   . Thyroid disease   . Hypothyroidism   . GERD  (gastroesophageal reflux disease)   . Cyst of cystic duct     chest- central location, taking Cephalexin currently  . Kidney stones   . Anemia     "off and on"  . Arthritis     "right toes" (03/24/2015)  . Cancer of right breast ([facility]) 2015   Past Surgical History  Procedure Laterality Date  . Tubal ligation    . Vaginal delivery  x3  . Vaginal hysterectomy  2006    "fibroids"  . Breast biopsy Right 10/2014; 11/2014  . Lithotripsy  ~ 2007  . Simple mastectomy with axillary sentinel node biopsy Right 11/25/2014    Procedure: RIGHT TOTAL  MASTECTOMY WITH AXILLARY SENTINEL NODE BIOPSY ;  Surgeon: Excell Seltzer, MD;  Location: [facility];  Service: General;  Laterality: Right;  . Breast reconstruction with placement of tissue expander and flex hd (acellular hydrated dermis) Right 11/25/2014    Procedure: RIGHT BREAST RECONSTRUCTION WITH PLACEMENT OF TISSUE EXPANDER AND USE OF ACELLULAR DERMRAL MATRIX ;  Surgeon: Crissie Reese, MD;  Location: [facility];  Service: Plastics;  Laterality: Right;  . Colonoscopy    . Removal of tissue expander Right 03/24/2015  . Removal of tissue expander and placement of implant Right  03/24/2015    w/delayed Architect  . Reduction mammaplasty Left 03/24/2015  . Laparoscopic cholecystectomy  2003  . Removal of tissue expander and placement of implant Right 03/24/2015    Procedure: REMOVAL OF RIGHT BREAST TISSUE EXPANDER AND DELAYED BREAST RECONSTRUCTION WITH PLACEMENT OF IMPLANT;  Surgeon: Crissie Reese, MD;  Location: [facility];  Service: Plastics;  Laterality: Right;  . Breast reduction surgery Left 03/24/2015    Procedure: LEFT BREAST REDUCTION  (BREAST);  Surgeon: Crissie Reese, MD;  Location: [facility];  Service: Plastics;  Laterality: Left;   Social History  Substance Use Topics  . Smoking status: Never Smoker   . Smokeless tobacco: Never Used  . Alcohol Use: No   Family History  Problem Relation Age of Onset  . Alcohol abuse Father   . COPD Father   . Hypertension  Father   . Diabetes Father   . Hypertension Mother    Allergies  Allergen Reactions  . Norvasc [Amlodipine Besylate] Swelling   Current Outpatient Prescriptions on File Prior to Visit  Medication Sig Dispense Refill  . anastrozole (ARIMIDEX) 1 MG tablet Take 1 tablet (1 mg total) by mouth daily. 90 tablet 3  . venlafaxine XR (EFFEXOR-XR) 37.5 MG 24 hr capsule TAKE 1 CAPSULE (37.5 MG TOTAL) BY MOUTH DAILY WITH BREAKFAST. 30 capsule 5   No current facility-administered medications on file prior to visit.    Review of Systems Review of Systems  Constitutional: Negative for fever, appetite change, fatigue and unexpected weight change.  Eyes: Negative for pain and visual disturbance.  Respiratory: Negative for cough and shortness of breath.   Cardiovascular: Negative for cp or palpitations    Gastrointestinal: Negative for nausea, diarrhea and constipation.  Genitourinary: Negative for urgency and frequency.  Skin: Negative for pallor or rash   Neurological: Negative for weakness, light-headedness, numbness and headaches.  Hematological: Negative for adenopathy. Does not bruise/bleed easily.  Psychiatric/Behavioral: Negative for dysphoric mood. The patient is not nervous/anxious.  pos for caregiver stress       Objective:   Physical Exam  Constitutional: She appears well-developed and well-nourished. No distress.  Well appearing   HENT:  Head: Normocephalic and atraumatic.  Mouth/Throat: Oropharynx is clear and moist.  Eyes: Conjunctivae and EOM are normal. Pupils are equal, round, and reactive to light.  Neck: Normal range of motion. Neck supple. No JVD present. Carotid bruit is not present. No thyromegaly present.  Cardiovascular: Normal rate, regular rhythm, normal heart sounds and intact distal pulses.  Exam reveals no gallop.   Pulmonary/Chest: Effort normal and breath sounds normal. No respiratory distress. She has no wheezes. She has no rales.  No crackles  Abdominal:  Soft. Bowel sounds are normal. She exhibits no distension, no abdominal bruit and no mass. There is no tenderness.  Musculoskeletal: She exhibits no edema.  Lymphadenopathy:    She has no cervical adenopathy.  Neurological: She is alert. She has normal reflexes.  Skin: Skin is warm and dry. No rash noted.  Psychiatric: She has a normal mood and affect.          Assessment & Plan:   Problem List Items Addressed This Visit      Cardiovascular and Mediastinum   Essential hypertension - Primary    Improved on 2nd check bp in fair control at this time  BP Readings from Last 1 Encounters:  10/31/15 135/85   No changes needed Disc lifstyle change with low sodium diet and exercise  Stress has increased this  Continue to follow  Lab today      Relevant Medications   losartan-hydrochlorothiazide (HYZAAR) 50-12.5 MG tablet   Other Relevant Orders   Comprehensive metabolic panel (Completed)   CBC with Differential/Platelet (Completed)   TSH (Completed)   Lipid panel (Completed)     Endocrine   Hypothyroidism    Hypothyroidism  Pt has no clinical changes No change in energy level/ hair or skin/ edema and no tremor Lab today      Relevant Medications   levothyroxine (SYNTHROID, LEVOTHROID) 25 MCG tablet   Other Relevant Orders   TSH (Completed)     Other   Breast cancer of upper-outer quadrant of right female breast [facility])    Doing well s/p mastectomy Tolerating arimidex better than tamoxifen  Will check with ins to see if dexa is covered -I recommend that  Disc ca and D intake

## 2015-10-31 NOTE — Progress Notes (Signed)
Pre visit review using our clinic review tool, if applicable. No additional management support is needed unless otherwise documented below in the visit note. 

## 2015-10-31 NOTE — Assessment & Plan Note (Addendum)
Improved on 2nd check bp in fair control at this time  BP Readings from Last 1 Encounters:  10/31/15 135/85   No changes needed Disc lifstyle change with low sodium diet and exercise  Stress has increased this  Continue to follow  Lab today

## 2015-10-31 NOTE — Assessment & Plan Note (Signed)
Discussed how this problem influences overall health and the risks it imposes  Reviewed plan for weight loss with lower calorie diet (via better food choices and also portion control or program like weight watchers) and exercise building up to or more than 30 minutes 5 days per week including some aerobic activity    

## 2015-10-31 NOTE — Patient Instructions (Signed)
Call your insurance to see if a bone density test would be covered-tell them you have been on Tamoxifen and Arimedex and on thyroid medicine  If they cover it let me know and we will set it up  bp is 135/85- work on weight loss and exercise when you can   Lab today  Good luck with your mom

## 2015-10-31 NOTE — Assessment & Plan Note (Signed)
Hypothyroidism  Pt has no clinical changes No change in energy level/ hair or skin/ edema and no tremor Lab today  

## 2015-10-31 NOTE — Assessment & Plan Note (Signed)
Doing well s/p mastectomy Tolerating arimidex better than tamoxifen  Will check with ins to see if dexa is covered -I recommend that  Disc ca and D intake

## 2015-11-03 ENCOUNTER — Other Ambulatory Visit: Payer: Self-pay | Admitting: Family Medicine

## 2015-11-16 ENCOUNTER — Encounter: Payer: Self-pay | Admitting: Adult Health

## 2015-11-16 NOTE — Progress Notes (Signed)
A birthday card was mailed to the patient today on behalf of the Survivorship Program at [facility].   Gretchen Dawson, NP Survivorship Program [facility] [phone]  

## 2015-12-23 ENCOUNTER — Telehealth: Payer: Self-pay | Admitting: Hematology and Oncology

## 2015-12-23 NOTE — Telephone Encounter (Signed)
Patient called in to reschedule her appointment °

## 2015-12-26 ENCOUNTER — Ambulatory Visit: Payer: 59 | Admitting: Hematology and Oncology

## 2016-01-03 ENCOUNTER — Other Ambulatory Visit: Payer: Self-pay | Admitting: *Deleted

## 2016-01-03 NOTE — Assessment & Plan Note (Signed)
Right breast invasive ductal carcinoma 2.8 cm status post mastectomy grade 3 with high-grade DCIS, ALH and LCIS; separate tumor which was DCIS 1.5 cm, 1 SLN negative, ER 86%, PR 95%, HER-2 negative ratio 1.52, Ki-67 27% T2 N0 M0 stage II a, Oncotype Dx score 18 (11% ROR) Low risk; started tamoxifen 12/27/2014 Switched to anastrozole April 2016  Anastrozole Toxicities: 1. Severe hot flashes On Effexor. In spite of this she is continuing to have severe hot flashes almost every hour. FSH: 63, Estradiol: 7: Patient is post-menopausal  Breast Cancer Surveillance: 1. Breast exam 01/03/16 Normal 2. Mammogram done October 2016 on the left breast.  RTC in 6 months for follow up 

## 2016-01-04 ENCOUNTER — Telehealth: Payer: Self-pay | Admitting: Hematology and Oncology

## 2016-01-04 ENCOUNTER — Ambulatory Visit (HOSPITAL_BASED_OUTPATIENT_CLINIC_OR_DEPARTMENT_OTHER): Payer: 59 | Admitting: Hematology and Oncology

## 2016-01-04 ENCOUNTER — Encounter: Payer: Self-pay | Admitting: Hematology and Oncology

## 2016-01-04 VITALS — BP 140/81 | HR 83 | Temp 98.2°F | Resp 18 | Ht 64.0 in | Wt 227.3 lb

## 2016-01-04 DIAGNOSIS — C50411 Malignant neoplasm of upper-outer quadrant of right female breast: Secondary | ICD-10-CM

## 2016-01-04 NOTE — Progress Notes (Signed)
Patient Care Team: Abner Greenspan, MD as PCP - General Nicholas Lose, MD as Consulting Physician (Hematology and Oncology) Excell Seltzer, MD as Consulting Physician (General Surgery) Eppie Gibson, MD as Attending Physician (Radiation Oncology) Holley Bouche, NP as Nurse Practitioner (Nurse Practitioner)  DIAGNOSIS: Breast cancer of upper-outer quadrant of right female breast [facility])   Staging form: Breast, AJCC 7th Edition     Clinical: Stage IIA (T2, N0, M0) - Unsigned       Staging comments: Staged at breast conference on 11.11.15      Pathologic stage from 11/25/2014: Stage IIA (T2(2), N0, cM0) - Unsigned       Staging comments: Staged from final excisional pathology from Dr. Donato Heinz    SUMMARY OF ONCOLOGIC HISTORY:   Breast cancer of upper-outer quadrant of right female breast ([place])   10/20/2014 Initial Diagnosis Right breast invasive and in situ mammary cancer grade 2 E-cadherin positive, invasive ductal carcinoma with DCIS focal atypical cells negative for atypia and ER 70% PR 80% HER-2 negative ratio 1.26 Ki-67 27%   10/22/2014 Breast MRI Right breast upper outer quadrant 2.3 cm biopsy-proven malignancy   11/25/2014 Surgery Right simple mastectomy: Tumor #1 upper lateral invasive high-grade ductal carcinoma 2.8 cm plus high-grade DCIS plus ALH and LCIS; tumor #2 upper medial DCIS 1.5 cm. Margins negative.   11/25/2014 Oncotype testing 18 (11% 10-year risk of distant recurrence on Tamoxifen alone).    01/03/2015 -  Anti-estrogen oral therapy Tamoxifen  20 mg daily discontinued 03/28/2015 and switched to anastrozole 1 mg 03/23/15 daily when she was found to be postmenopausal. Plan treatment duration 5 years   03/24/2015 Surgery Left breast mammoplasty: Benign    CHIEF COMPLIANT:  Follow-up 1 anastrozole  INTERVAL HISTORY: Diana Deleon is a 55 year old with above-mentioned history of right breast cancer treated with mastectomy followed by adjuvant tamoxifen later changed to anastrozole  when she became postmenopausal. She had profound hot flashes for which we prescribed Effexor. In spite of that, her hot flashes continue to be a problem. She denies any lumps or nodules in the breasts. She has noticed some tingling and numbness when she lifts her arm over the head. She started taking aspirin and the symptoms appear to have improved.  REVIEW OF SYSTEMS:   Constitutional: Denies fevers, chills or abnormal weight loss, complains of severe hot flashes Eyes: Denies blurriness of vision Ears, nose, mouth, throat, and face: Denies mucositis or sore throat Respiratory: Denies cough, dyspnea or wheezes Cardiovascular: Denies palpitation, chest discomfort Gastrointestinal:  Denies nausea, heartburn or change in bowel habits Skin: Denies abnormal skin rashes Lymphatics: Denies new lymphadenopathy or easy bruising Neurological:tingling and numbness when she lifts her arm above the head Behavioral/Psych: Mood is stable, no new changes  Extremities: No lower extremity edema Breast:  denies any pain or lumps or nodules in either breasts All other systems were reviewed with the patient and are negative.  I have reviewed the past medical history, past surgical history, social history and family history with the patient and they are unchanged from previous note.  ALLERGIES:  is allergic to norvasc.  MEDICATIONS:  Current Outpatient Prescriptions  Medication Sig Dispense Refill  . anastrozole (ARIMIDEX) 1 MG tablet Take 1 tablet (1 mg total) by mouth daily. 90 tablet 3  . levothyroxine (SYNTHROID, LEVOTHROID) 25 MCG tablet TAKE 1 TABLET BY MOUTH DAILY BEFORE BREAKFAST. 30 tablet 11  . losartan-hydrochlorothiazide (HYZAAR) 50-12.5 MG tablet TAKE 1 TABLET BY MOUTH EVERY DAY 90 tablet 3  .  omeprazole (PRILOSEC OTC) 20 MG tablet Take 1 tablet (20 mg total) by mouth daily before breakfast. 30 tablet 11  . venlafaxine XR (EFFEXOR-XR) 37.5 MG 24 hr capsule TAKE 1 CAPSULE (37.5 MG TOTAL) BY MOUTH  DAILY WITH BREAKFAST. 30 capsule 5   No current facility-administered medications for this visit.    PHYSICAL EXAMINATION: ECOG PERFORMANCE STATUS: 1 - Symptomatic but completely ambulatory  Filed Vitals:   01/04/16 0939  BP: 140/81  Pulse: 83  Temp: 98.2 F (36.8 C)  Resp: 18   Filed Weights   01/04/16 0939  Weight: 227 lb 4.8 oz (103.103 kg)    GENERAL:alert, no distress and comfortable SKIN: skin color, texture, turgor are normal, no rashes or significant lesions EYES: normal, Conjunctiva are pink and non-injected, sclera clear OROPHARYNX:no exudate, no erythema and lips, buccal mucosa, and tongue normal  NECK: supple, thyroid normal size, non-tender, without nodularity LYMPH:  no palpable lymphadenopathy in the cervical, axillary or inguinal LUNGS: clear to auscultation and percussion with normal breathing effort HEART: regular rate & rhythm and no murmurs and no lower extremity edema ABDOMEN:abdomen soft, non-tender and normal bowel sounds MUSCULOSKELETAL:no cyanosis of digits and no clubbing  NEURO: alert & oriented x 3 with fluent speech, no focal motor/sensory deficits EXTREMITIES: No lower extremity edema BREAST: No palpable masses or nodules in either right or left breasts. No palpable axillary supraclavicular or infraclavicular adenopathy no breast tenderness or nipple discharge. (exam performed in the presence of a chaperone)  LABORATORY DATA:  I have reviewed the data as listed   Chemistry      Component Value Date/Time   NA 141 10/31/2015 0909   NA 142 06/27/2015 0949   K 4.5 10/31/2015 0909   K 3.5 06/27/2015 0949   CL 101 10/31/2015 0909   CO2 33* 10/31/2015 0909   CO2 30* 06/27/2015 0949   BUN 14 10/31/2015 0909   BUN 15.1 06/27/2015 0949   CREATININE 0.91 10/31/2015 0909   CREATININE 0.9 06/27/2015 0949      Component Value Date/Time   CALCIUM 9.7 10/31/2015 0909   CALCIUM 9.3 06/27/2015 0949   ALKPHOS 98 10/31/2015 0909   ALKPHOS 88  06/27/2015 0949   AST 21 10/31/2015 0909   AST 19 06/27/2015 0949   ALT 23 10/31/2015 0909   ALT 22 06/27/2015 0949   BILITOT 0.6 10/31/2015 0909   BILITOT 0.45 06/27/2015 0949      Lab Results  Component Value Date   WBC 7.1 10/31/2015   HGB 14.9 10/31/2015   HCT 45.1 10/31/2015   MCV 89.0 10/31/2015   PLT 256.0 10/31/2015   NEUTROABS 4.4 10/31/2015   ASSESSMENT & PLAN:  Breast cancer of upper-outer quadrant of right female breast Right breast invasive ductal carcinoma 2.8 cm status post mastectomy grade 3 with high-grade DCIS, ALH and LCIS; separate tumor which was DCIS 1.5 cm, 1 SLN negative, ER 86%, PR 95%, HER-2 negative ratio 1.52, Ki-67 27% T2 N0 M0 stage II a, Oncotype Dx score 18 (11% ROR) Low risk; started tamoxifen 12/27/2014 Switched to anastrozole April 2016  Anastrozole Toxicities: 1. Severe hot flashes On Effexor. In spite of this she is continuing to have severe hot flashes almost every hour. FSH: 63, Estradiol: 7: Patient is post-menopausal I discussed with her about doubling the dosage of Effexor. She will try to take 2 tablets of Effexor daily. If that seems to be helping, then we will prescribe her 75 mg daily. 2. Tingling and numbness when she lifts  her arm above the head: Current aspirin. I discussed with her that anastrozole does not significantly increase risk of blood clots.  Breast Cancer Surveillance: 1. Breast exam 01/03/16 Normal 2. Mammogram done October 2016 on the left breast.  RTC in 6 months for follow up No orders of the defined types were placed in this encounter.   The patient has a good understanding of the overall plan. she agrees with it. she will call with any problems that may develop before the next visit here.   Rulon Eisenmenger, MD 01/04/2016

## 2016-01-04 NOTE — Telephone Encounter (Signed)
Patient was given appointments and avs report.

## 2016-02-06 ENCOUNTER — Encounter: Payer: Self-pay | Admitting: Hematology and Oncology

## 2016-02-13 ENCOUNTER — Other Ambulatory Visit: Payer: Self-pay | Admitting: *Deleted

## 2016-02-13 DIAGNOSIS — C50411 Malignant neoplasm of upper-outer quadrant of right female breast: Secondary | ICD-10-CM

## 2016-02-13 MED ORDER — VENLAFAXINE HCL 75 MG PO TABS: 75.0000 mg | ORAL_TABLET | Freq: Two times a day (BID) | ORAL | Status: DC

## 2016-03-09 ENCOUNTER — Other Ambulatory Visit: Payer: Self-pay | Admitting: Hematology and Oncology

## 2016-03-09 NOTE — Telephone Encounter (Signed)
Chart reviewed.

## 2016-07-02 ENCOUNTER — Ambulatory Visit (HOSPITAL_BASED_OUTPATIENT_CLINIC_OR_DEPARTMENT_OTHER): Payer: 59 | Admitting: Hematology and Oncology

## 2016-07-02 ENCOUNTER — Encounter: Payer: Self-pay | Admitting: Hematology and Oncology

## 2016-07-02 VITALS — BP 166/95 | HR 82 | Temp 98.4°F | Resp 18 | Ht 64.0 in | Wt 220.5 lb

## 2016-07-02 DIAGNOSIS — Z79811 Long term (current) use of aromatase inhibitors: Secondary | ICD-10-CM

## 2016-07-02 DIAGNOSIS — Z170 Estrogen receptor positive status [ER+]: Secondary | ICD-10-CM

## 2016-07-02 DIAGNOSIS — C50411 Malignant neoplasm of upper-outer quadrant of right female breast: Secondary | ICD-10-CM

## 2016-07-02 MED ORDER — VENLAFAXINE HCL 75 MG PO TABS: 75.0000 mg | ORAL_TABLET | Freq: Once | ORAL | Status: DC

## 2016-07-02 NOTE — Progress Notes (Signed)
Patient Care Team: Abner Greenspan, MD as PCP - General Nicholas Lose, MD as Consulting Physician (Hematology and Oncology) Excell Seltzer, MD as Consulting Physician (General Surgery) Eppie Gibson, MD as Attending Physician (Radiation Oncology) Holley Bouche, NP as Nurse Practitioner (Nurse Practitioner)  DIAGNOSIS: Breast cancer of upper-outer quadrant of right female breast [facility])   Staging form: Breast, AJCC 7th Edition     Clinical: Stage IIA (T2, N0, M0) - Unsigned       Staging comments: Staged at breast conference on 11.11.15      Pathologic stage from 11/25/2014: Stage IIA (T2(2), N0, cM0) - Unsigned       Staging comments: Staged from final excisional pathology from Dr. Donato Heinz    SUMMARY OF ONCOLOGIC HISTORY:   Breast cancer of upper-outer quadrant of right female breast ([place])   10/20/2014 Initial Diagnosis Right breast invasive and in situ mammary cancer grade 2 E-cadherin positive, invasive ductal carcinoma with DCIS focal atypical cells negative for atypia and ER 70% PR 80% HER-2 negative ratio 1.26 Ki-67 27%   10/22/2014 Breast MRI Right breast upper outer quadrant 2.3 cm biopsy-proven malignancy   11/25/2014 Surgery Right simple mastectomy: Tumor #1 upper lateral invasive high-grade ductal carcinoma 2.8 cm plus high-grade DCIS plus ALH and LCIS; tumor #2 upper medial DCIS 1.5 cm. Margins negative.   11/25/2014 Oncotype testing 18 (11% 10-year risk of distant recurrence on Tamoxifen alone).    01/03/2015 -  Anti-estrogen oral therapy Tamoxifen  20 mg daily discontinued 03/28/2015 and switched to anastrozole 1 mg 03/23/15 daily when she was found to be postmenopausal. Plan treatment duration 5 years   03/24/2015 Surgery Left breast mammoplasty: Benign    CHIEF COMPLIANT: Marked improvement in hot flashes  INTERVAL HISTORY: Diana Deleon is a 55 year old with above-mentioned history of right breast cancer treated with mastectomy and is currently on anastrozole. Initially she was  on tamoxifen. She was having extraordinary amount of hot flashes and increased Effexor dose and it appears to be working well. She does feel generally hot but the number of hot flashes are markedly subsided. Recently she moved in with her mother to a new house. 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 She is slightly sore in the groin and has not had time for exercise.  REVIEW OF SYSTEMS:   Constitutional: Denies fevers, chills or abnormal weight loss, hot flashes Eyes: Denies blurriness of vision Ears, nose, mouth, throat, and face: Denies mucositis or sore throat Respiratory: Denies cough, dyspnea or wheezes Cardiovascular: Denies palpitation, chest discomfort Gastrointestinal:  Denies nausea, heartburn or change in bowel habits Skin: Denies abnormal skin rashes Lymphatics: Denies new lymphadenopathy or easy bruising Neurological:Denies numbness, tingling or new weaknesses Behavioral/Psych: Mood is stable, no new changes  Extremities: No lower extremity edema Breast:  denies any pain or lumps or nodules in left breast All other systems were reviewed with the patient and are negative.  I have reviewed the past medical history, past surgical history, social history and family history with the patient and they are unchanged from previous note.  ALLERGIES:  is allergic to norvasc.  MEDICATIONS:  Current Outpatient Prescriptions  Medication Sig Dispense Refill  . anastrozole (ARIMIDEX) 1 MG tablet TAKE 1 TABLET (1 MG TOTAL) BY MOUTH DAILY. 90 tablet 3  . levothyroxine (SYNTHROID, LEVOTHROID) 25 MCG tablet TAKE 1 TABLET BY MOUTH DAILY BEFORE BREAKFAST. 30 tablet 11  . losartan-hydrochlorothiazide (HYZAAR) 50-12.5 MG tablet TAKE 1 TABLET BY MOUTH EVERY DAY 90 tablet 3  . omeprazole (PRILOSEC  OTC) 20 MG tablet Take 1 tablet (20 mg total) by mouth daily before breakfast. 30 tablet 11  . venlafaxine (EFFEXOR) 75 MG tablet Take 1 tablet (75 mg total) by mouth once. 90 tablet 3   No current  facility-administered medications for this visit.    PHYSICAL EXAMINATION: ECOG PERFORMANCE STATUS: 1 - Symptomatic but completely ambulatory  Filed Vitals:   07/02/16 0842  BP: 166/95  Pulse: 82  Temp: 98.4 F (36.9 C)  Resp: 18   Filed Weights   07/02/16 0842  Weight: 220 lb 8 oz (100.018 kg)    GENERAL:alert, no distress and comfortable SKIN: skin color, texture, turgor are normal, no rashes or significant lesions EYES: normal, Conjunctiva are pink and non-injected, sclera clear OROPHARYNX:no exudate, no erythema and lips, buccal mucosa, and tongue normal  NECK: supple, thyroid normal size, non-tender, without nodularity LYMPH:  no palpable lymphadenopathy in the cervical, axillary or inguinal LUNGS: clear to auscultation and percussion with normal breathing effort HEART: regular rate & rhythm and no murmurs and no lower extremity edema ABDOMEN:abdomen soft, non-tender and normal bowel sounds MUSCULOSKELETAL:no cyanosis of digits and no clubbing  NEURO: alert & oriented x 3 with fluent speech, no focal motor/sensory deficits EXTREMITIES: No lower extremity edema  LABORATORY DATA:  I have reviewed the data as listed   Chemistry      Component Value Date/Time   NA 141 10/31/2015 0909   NA 142 06/27/2015 0949   K 4.5 10/31/2015 0909   K 3.5 06/27/2015 0949   CL 101 10/31/2015 0909   CO2 33* 10/31/2015 0909   CO2 30* 06/27/2015 0949   BUN 14 10/31/2015 0909   BUN 15.1 06/27/2015 0949   CREATININE 0.91 10/31/2015 0909   CREATININE 0.9 06/27/2015 0949      Component Value Date/Time   CALCIUM 9.7 10/31/2015 0909   CALCIUM 9.3 06/27/2015 0949   ALKPHOS 98 10/31/2015 0909   ALKPHOS 88 06/27/2015 0949   AST 21 10/31/2015 0909   AST 19 06/27/2015 0949   ALT 23 10/31/2015 0909   ALT 22 06/27/2015 0949   BILITOT 0.6 10/31/2015 0909   BILITOT 0.45 06/27/2015 0949       Lab Results  Component Value Date   WBC 7.1 10/31/2015   HGB 14.9 10/31/2015   HCT 45.1  10/31/2015   MCV 89.0 10/31/2015   PLT 256.0 10/31/2015   NEUTROABS 4.4 10/31/2015     ASSESSMENT & PLAN:  Breast cancer of upper-outer quadrant of right female breast Right breast invasive ductal carcinoma 2.8 cm status post mastectomy grade 3 with high-grade DCIS, ALH and LCIS; separate tumor which was DCIS 1.5 cm, 1 SLN negative, ER 86%, PR 95%, HER-2 negative ratio 1.52, Ki-67 27% T2 N0 M0 stage II a, Oncotype Dx score 18 (11% ROR) Low risk; started tamoxifen 12/27/2014 Switched to anastrozole April 2016  Anastrozole Toxicities: 1. Severe hot flashes On Effexor.Markedly improved since we increased the dosage of Effexor. 2. Tingling and numbness when she lifts her arm above the head. Also improved.  Breast Cancer Surveillance: 1. Breast exam 07/02/16 Normal 2. Mammogram done October 2016 on the left breast: benign. She gets annual mammogram the left breast in October 2017.  RTC in 1 year for follow up in May 2018.    No orders of the defined types were placed in this encounter.   The patient has a good understanding of the overall plan. she agrees with it. she will call with any problems that may  develop before the next visit here.   Rulon Eisenmenger, MD 07/02/2016

## 2016-07-02 NOTE — Assessment & Plan Note (Signed)
Right breast invasive ductal carcinoma 2.8 cm status post mastectomy grade 3 with high-grade DCIS, ALH and LCIS; separate tumor which was DCIS 1.5 cm, 1 SLN negative, ER 86%, PR 95%, HER-2 negative ratio 1.52, Ki-67 27% T2 N0 M0 stage II a, Oncotype Dx score 18 (11% ROR) Low risk; started tamoxifen 12/27/2014 Switched to anastrozole April 2016  Anastrozole Toxicities: 1. Severe hot flashes On Effexor. In spite of this she is continuing to have severe hot flashes almost every hour. FSH: 63, Estradiol: 7: Patient is post-menopausal I discussed with her about doubling the dosage of Effexor. She will try to take 2 tablets of Effexor daily. If that seems to be helping, then we will prescribe her 75 mg daily. 2. Tingling and numbness when she lifts her arm above the head: Current aspirin. I discussed with her that anastrozole does not significantly increase risk of blood clots.  Breast Cancer Surveillance: 1. Breast exam 07/02/16 Normal 2. Mammogram done October 2016 on the left breast: benign.  RTC in 1 year for follow up

## 2016-09-08 ENCOUNTER — Other Ambulatory Visit: Payer: Self-pay | Admitting: Adult Health

## 2016-09-08 IMAGING — US US RT BREAST BX
1 series · 13 of 15 positions shown · non-contrast
Comparison: Previous exams.

ADDENDUM:
Pathology revealed grade II invasive mammary carcinoma and mammary
carcinoma in situ in the right breast. This was found to be
concordant by Dr. Jada Dhawan. Pathology was discussed with the
patient by telephone. She reported doing well after the biopsy. Post
biopsy instructions were reviewed and her questions were answered. A
bilateral breast MRI has been scheduled for October 22, 2014. She is
scheduled at [REDACTED] [REDACTED] on
October 27, 2014. She is encouraged to come to The [REDACTED] for educational materials. My number is provided
for future questions and concerns.

Pathology results reported by Keandre Donahoe RN, BSN on October 21, 2014.
CLINICAL DATA: 4.2 cm area of dense acoustical shadowing and
architectural distortion in the 10 o'clock position of the right
breast at recent ultrasound and mammography.
EXAM:
ULTRASOUND GUIDED RIGHT BREAST CORE NEEDLE BIOPSY

[Series 1: us right breast bx · 13 of 15 slices shown]
[im 1/15]
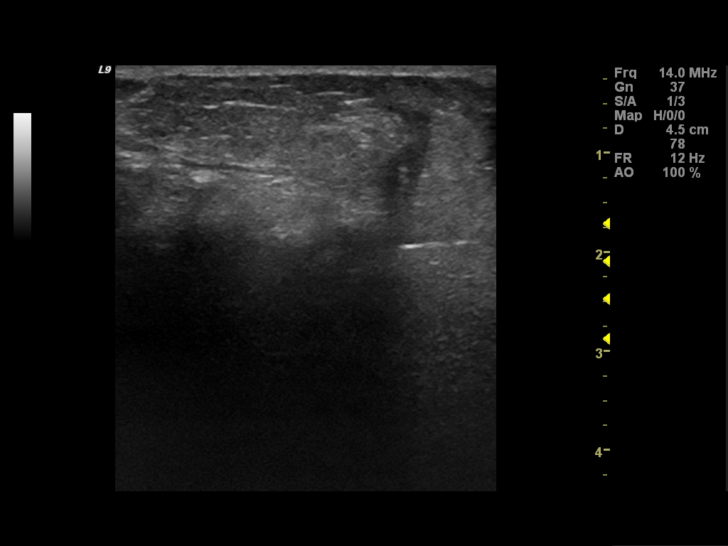
[im 2/15]
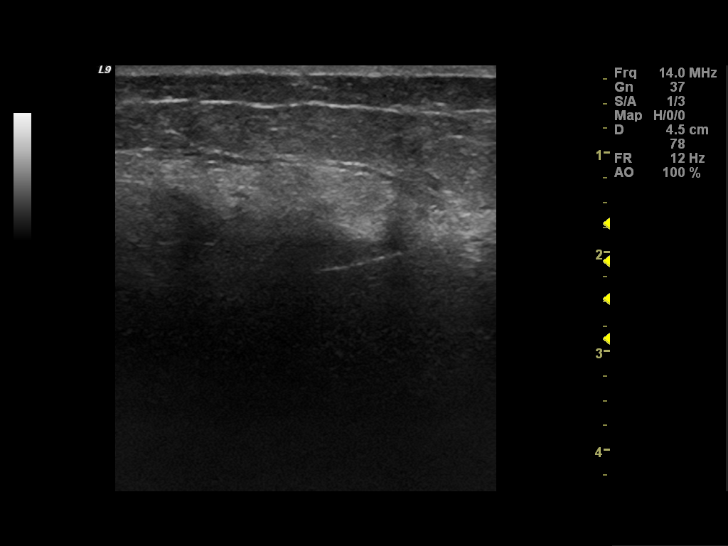
[im 3/15]
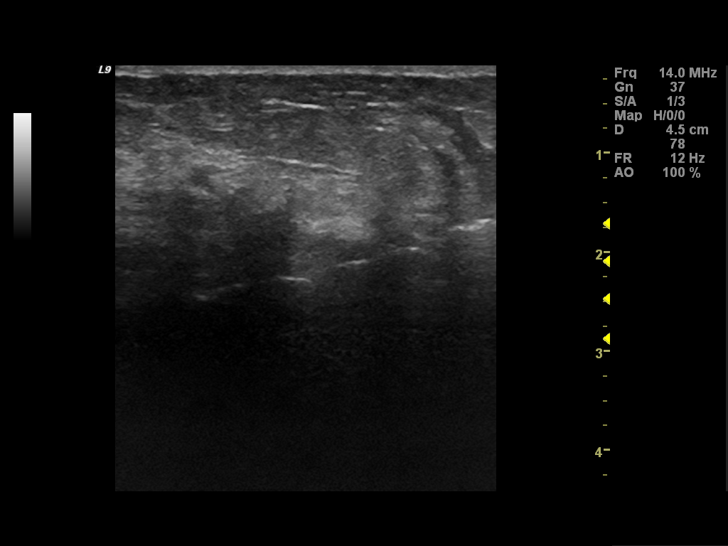
[im 5/15]
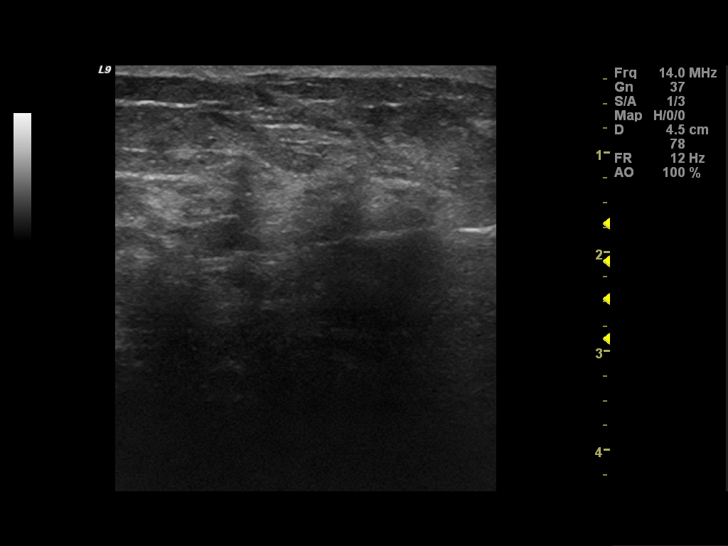
[im 6/15]
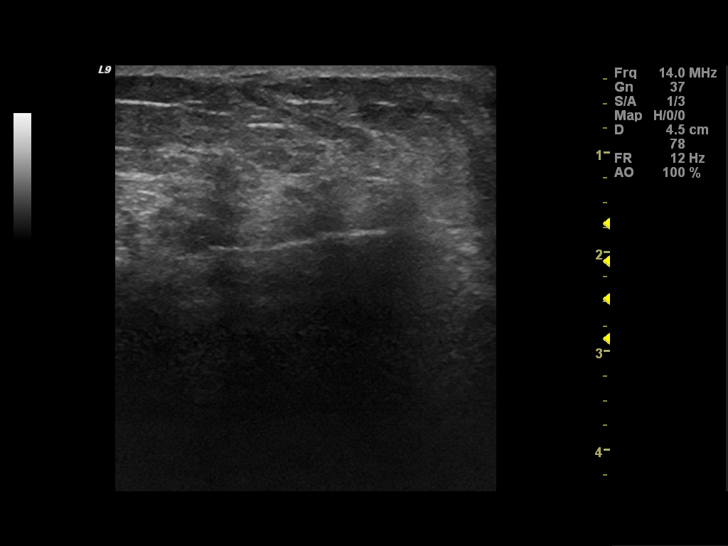
[im 7/15]
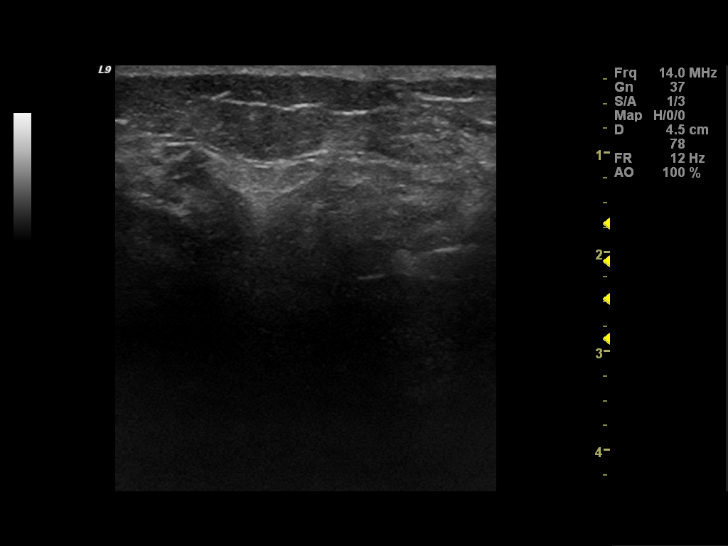
[im 8/15]
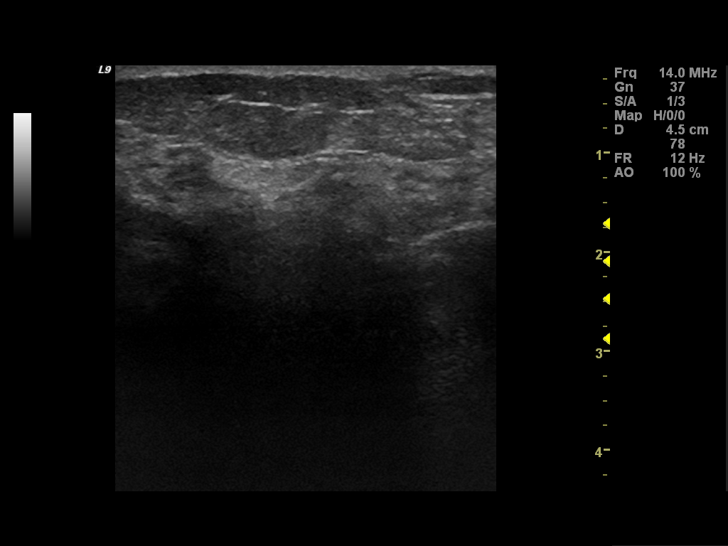
[im 9/15]
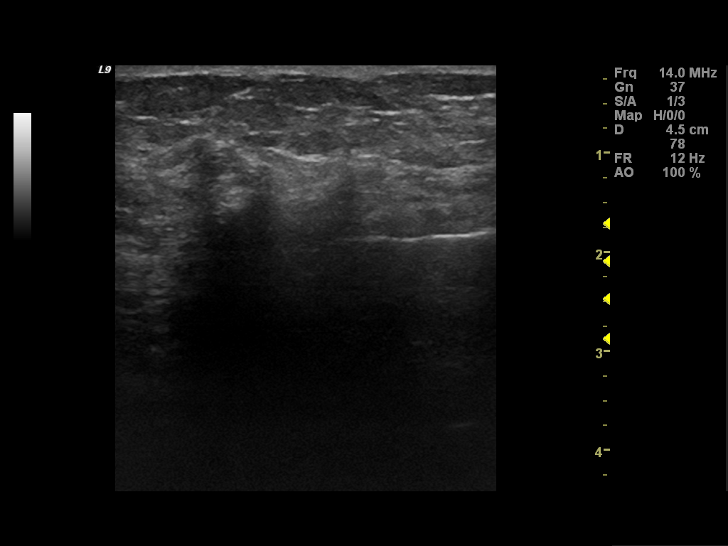
[im 10/15]
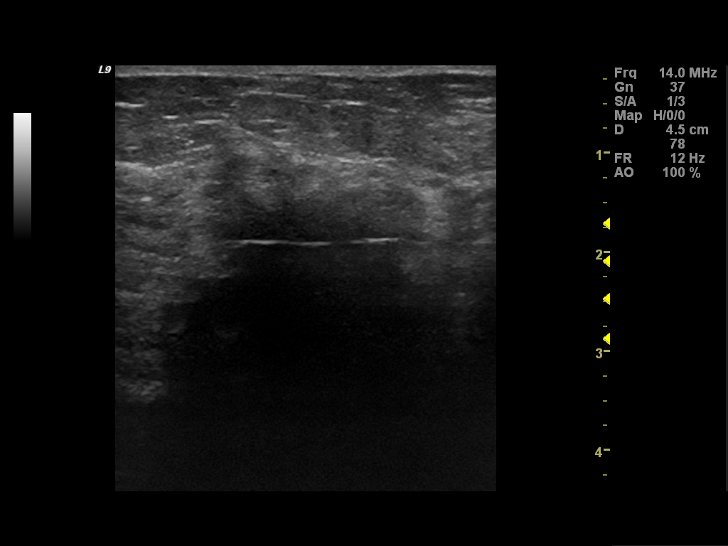
[im 11/15]
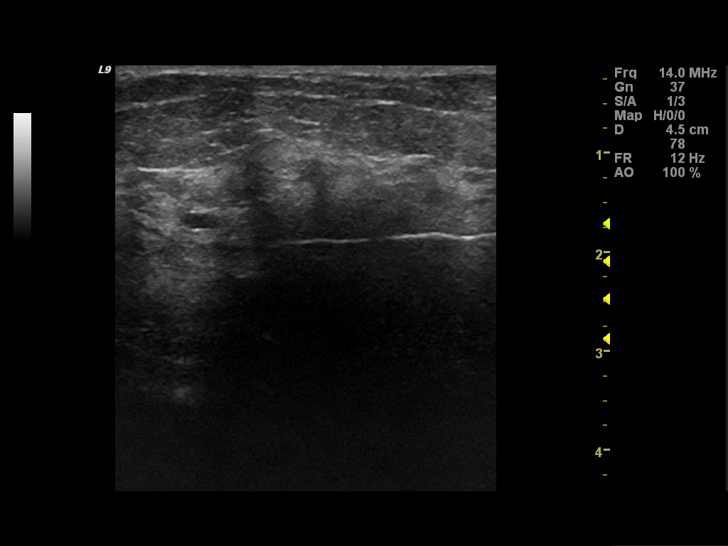
[im 13/15]
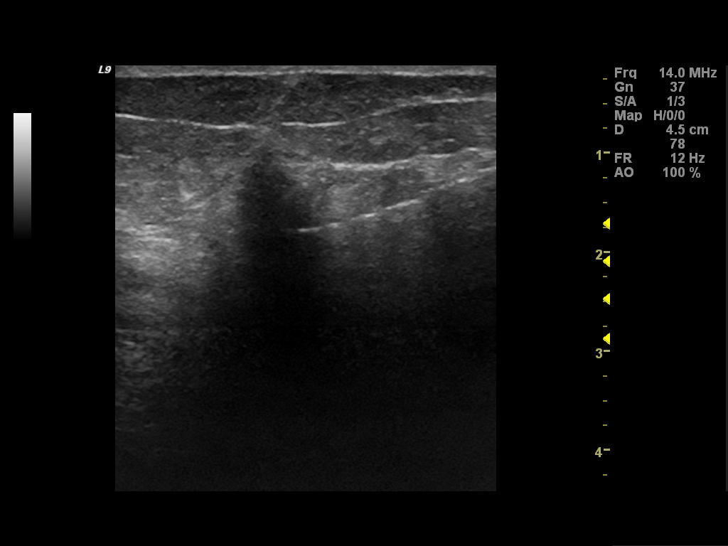
[im 14/15]
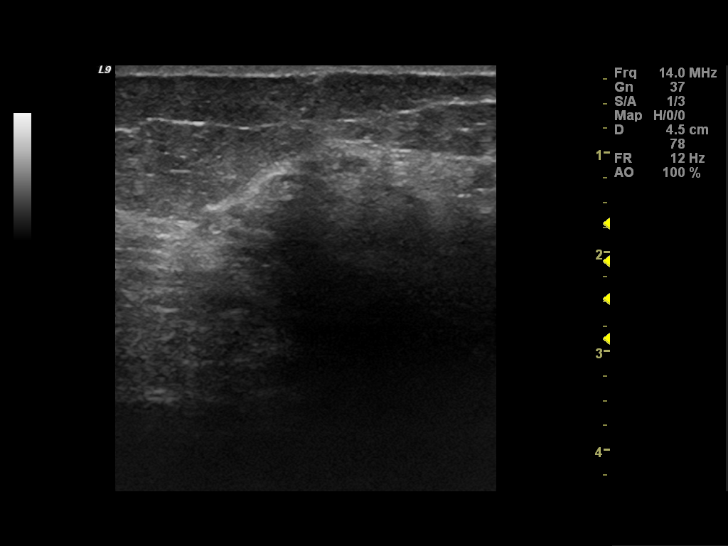
[im 15/15]
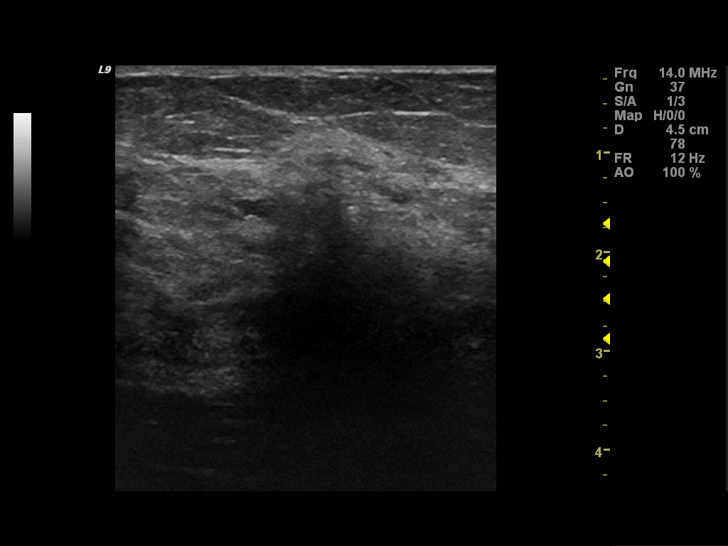

[13 of 15 positions shown; findings below may reference images not displayed]



Using sterile technique and 2% Lidocaine as local anesthetic, under
direct ultrasound visualization, a 12 gauge Vinas device was
used to perform biopsy of the recently demonstrated 4.2 cm area of
the dense acoustical shadowing in the 10 o'clock position of the
right breast, 7 cm from the nipple, using an inferior approach. At
the conclusion of the procedure a heart shaped tissue marker clip
was deployed into the biopsy cavity. Follow up 2 view mammogram was
performed and dictated separately.
IMPRESSION: Ultrasound guided biopsy of a 4.2 cm area of dense acoustical
shadowing in the 10 o'clock position of the right breast. No
apparent complications.

## 2016-09-08 IMAGING — MG MM DIGITAL DIAGNOSTIC UNILAT*R*
2 series · 2 of 2 positions shown · non-contrast
Comparison: Previous exams

CLINICAL DATA: Status post right breast ultrasound-guided core
needle biopsy.

EXAM:
DIAGNOSTIC RIGHT MAMMOGRAM POST ULTRASOUND BIOPSY

[R CC]
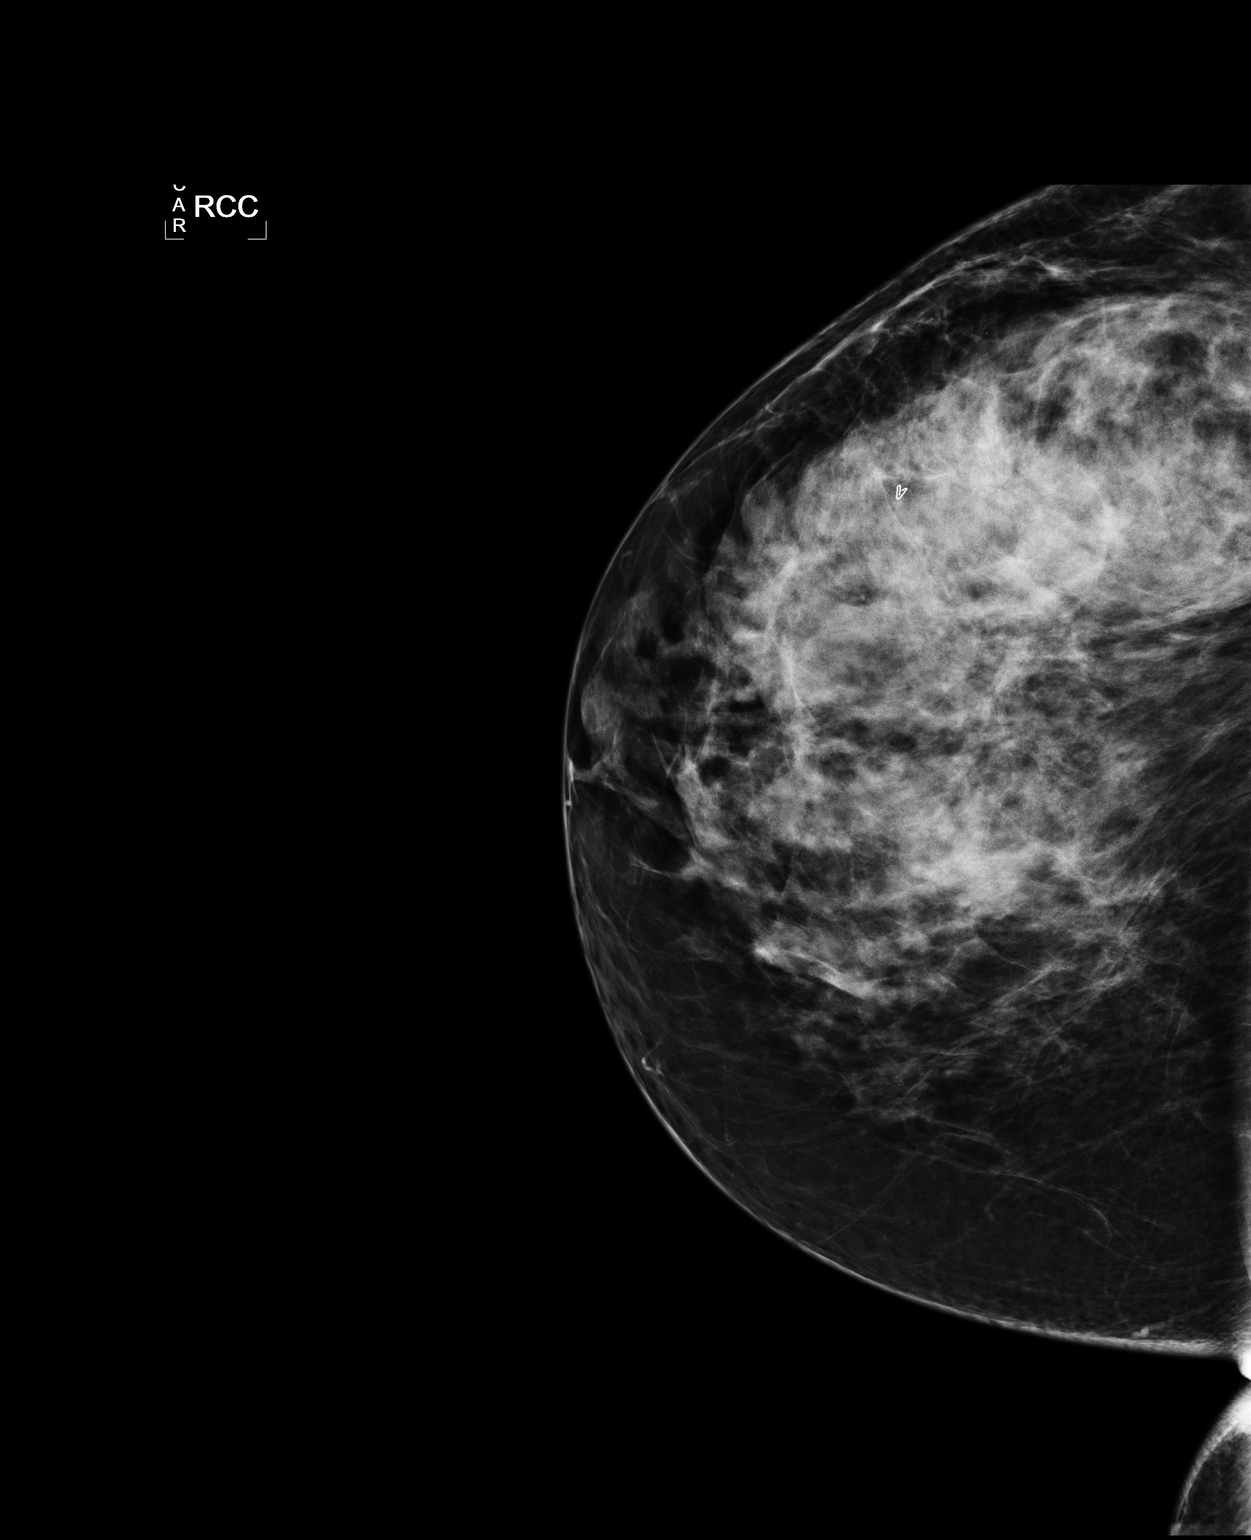

[R ML]
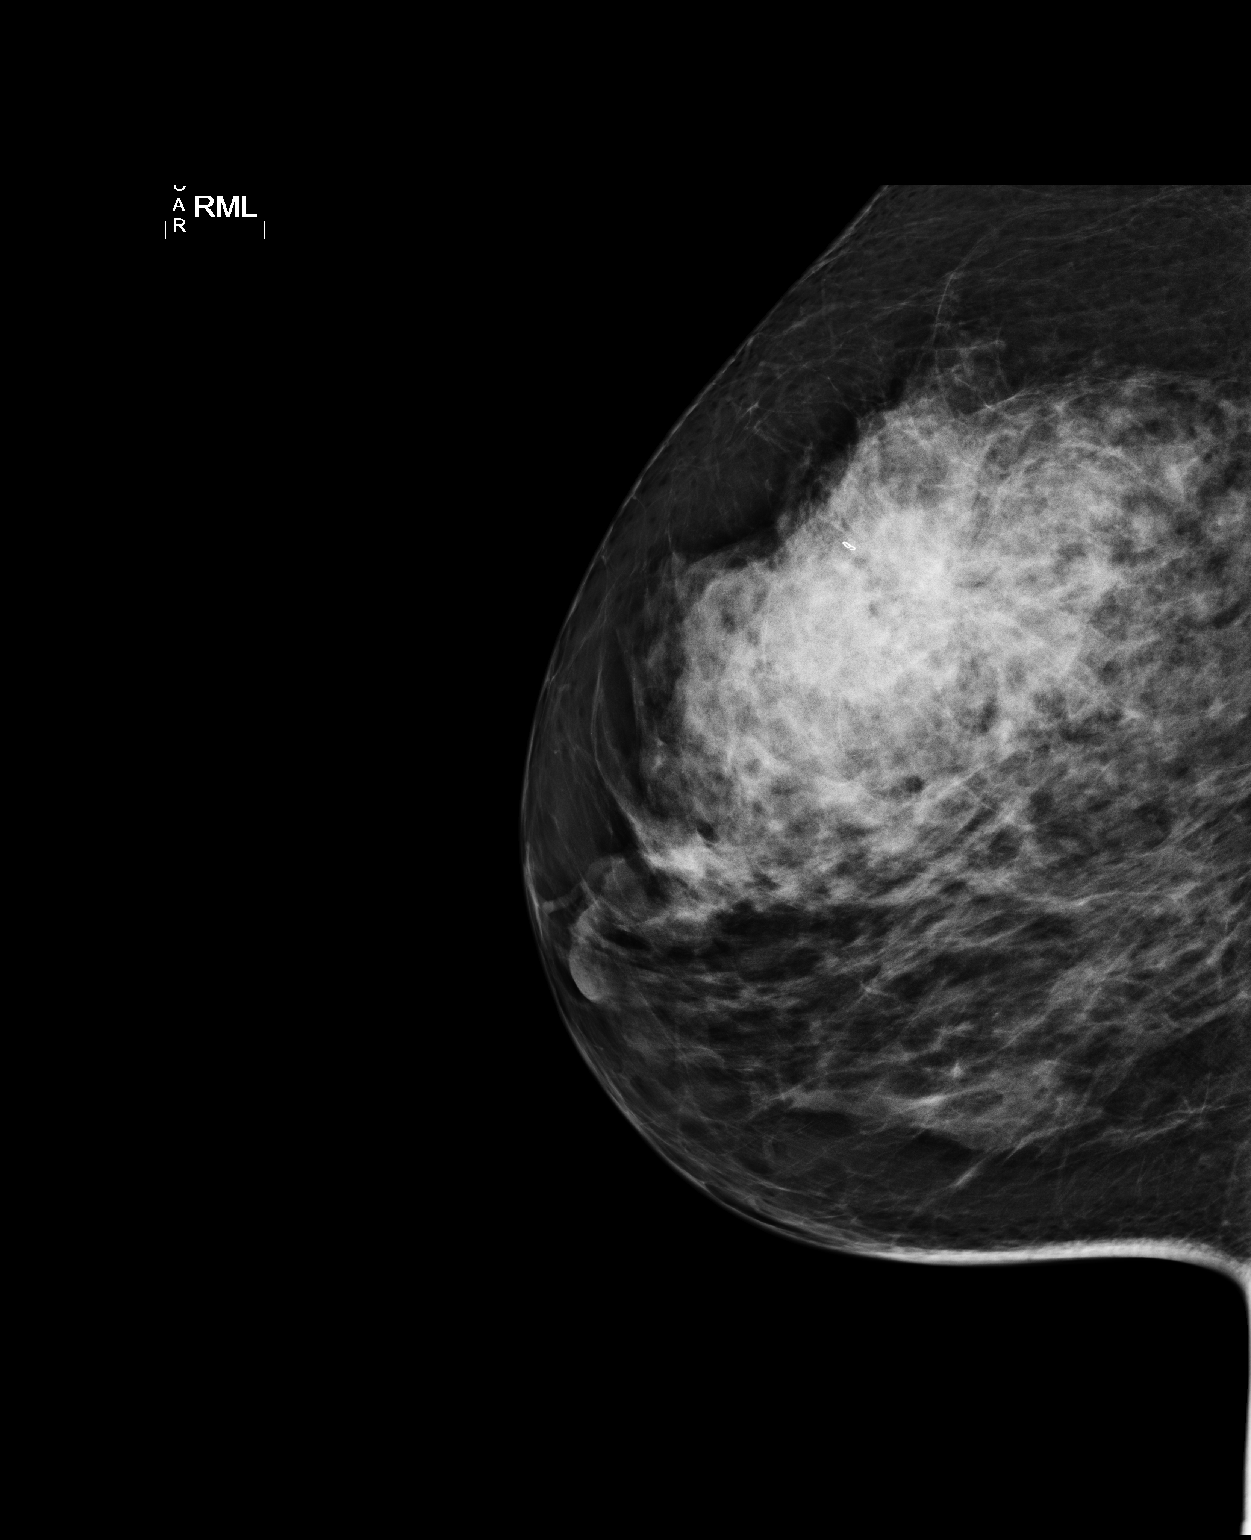

[2 of 2 positions shown; findings below may reference images not displayed]

FINDINGS: Mammographic images were obtained following ultrasound guided biopsy
of a 4.2 cm area of dense acoustical shadowing in the 10 o'clock
position of the right breast. These demonstrate a heart shaped
biopsy marker clip at the anterior, superior aspect of the recently
demonstrated area of acoustical shadowing in the upper outer right
breast.
IMPRESSION: Clip deployment at the anterior, superior aspect of the area of
architectural distortion in the upper outer right breast.

Final Assessment: Post Procedure Mammograms for Marker Placement

## 2016-09-10 ENCOUNTER — Other Ambulatory Visit: Payer: Self-pay | Admitting: *Deleted

## 2016-09-10 DIAGNOSIS — C50411 Malignant neoplasm of upper-outer quadrant of right female breast: Secondary | ICD-10-CM

## 2016-09-10 IMAGING — MR MR BREAST BILATERAL W WO CONTRAST
8 of 13 series · 31 of 48 positions shown · IV contrast (19ml Multihance)
Comparison: Previous exams

CLINICAL DATA: 52-year-old female with newly diagnosed invasive
right breast cancer.

LABS:  BUN and creatinine were obtained on site at [HOSPITAL]
[HOSPITAL].
Results:  BUN 12 mg/dL,  Creatinine 1.0 mg/dL.
EXAM:
BILATERAL BREAST MRI WITH AND WITHOUT CONTRAST
TECHNIQUE: Multiplanar, multisequence MR images of both breasts were obtained
prior to and following the intravenous administration of 19ml of
MultiHance.

[Series 2: T2 · axial · 3.0mm · 0.99mm/px · z∈[-121,+41]mm · 3 of 55 slices shown]
[im 1/55]
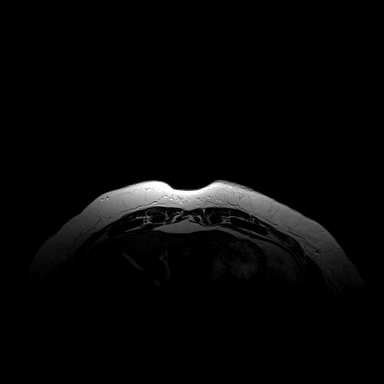
[im 28/55]
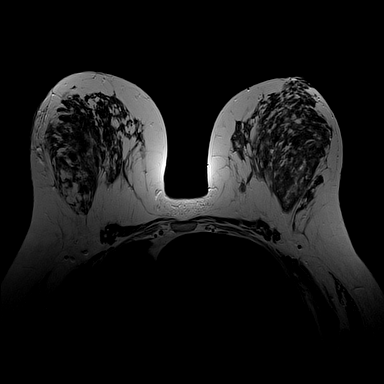
[im 55/55]
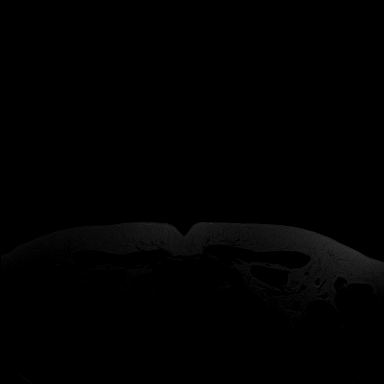

[Series 3: t2_tirm_tra ipat (a-p) · axial · 3.0mm · 0.70mm/px · z∈[-121,+41]mm · 2 of 55 slices shown]
[im 1/55]
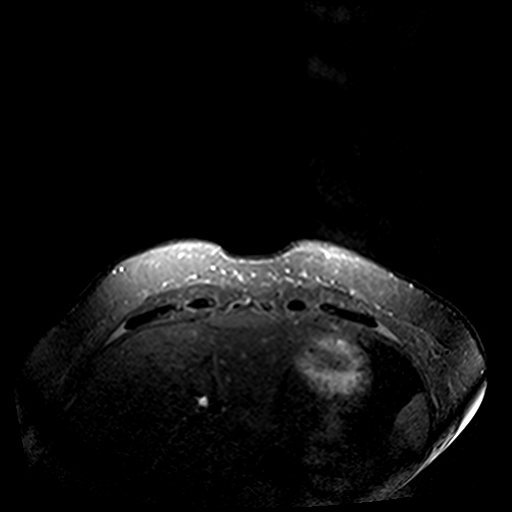
[im 55/55]
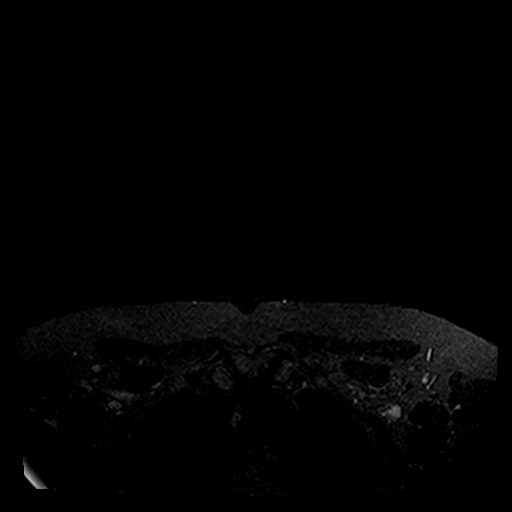

[Series 4: fl3d pre-cm no · axial · non-contrast · 1.2mm · 0.94mm/px · z∈[-126,+46]mm · 5 of 144 slices shown]
[im 1/144]
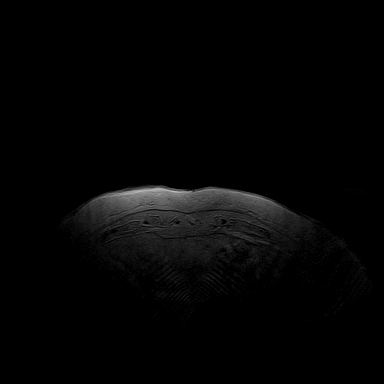
[im 36/144]
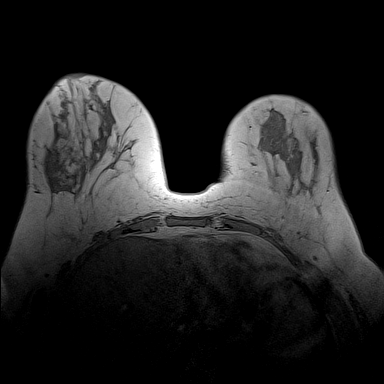
[im 72/144]
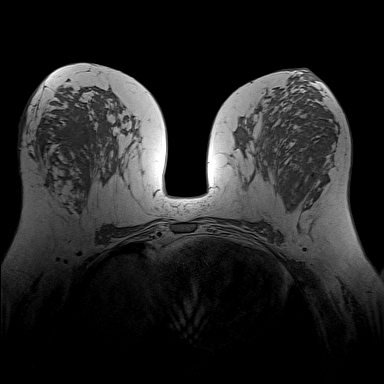
[im 108/144]
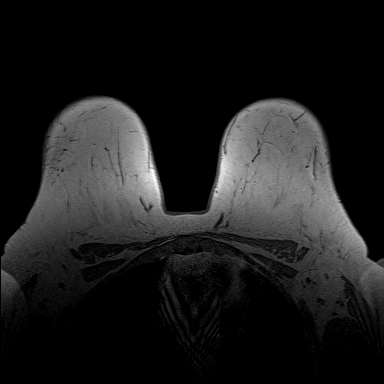
[im 144/144]
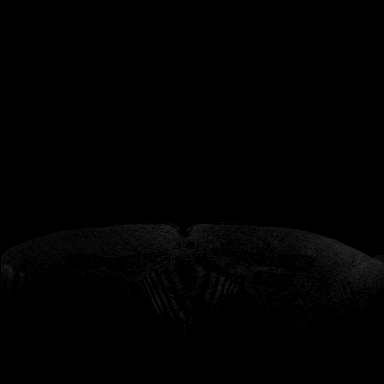

[Series 5: fl3d pre-cm · axial · non-contrast · 1.2mm · 0.94mm/px · z∈[-126,+46]mm · 5 of 144 slices shown]
[im 1/144]
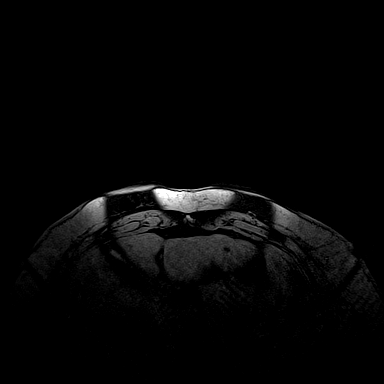
[im 36/144]
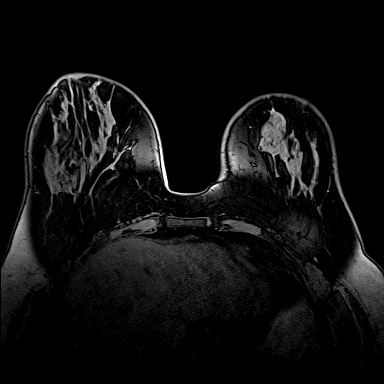
[im 72/144]
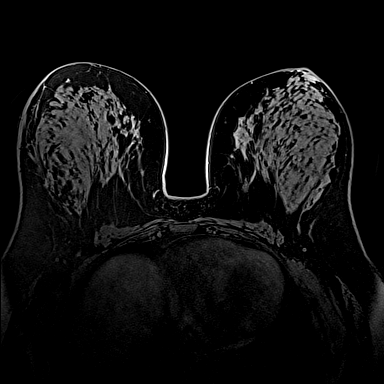
[im 108/144]
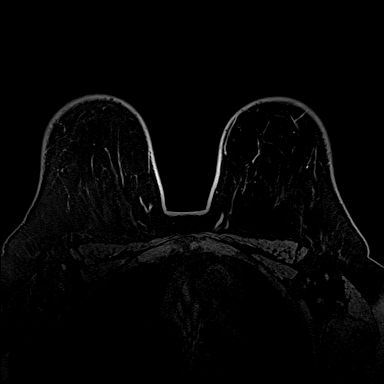
[im 144/144]
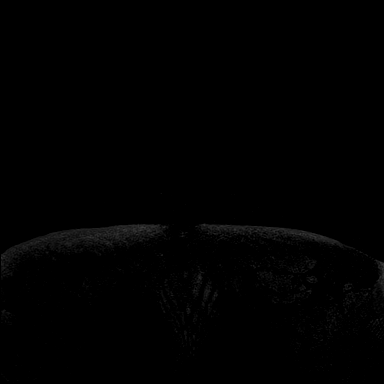

[Series 6: fl3d post-cm 20 · axial · 1.2mm · 0.94mm/px · z∈[-126,+46]mm · 5 of 144 slices shown (1 of 3)]
[im 1/144]
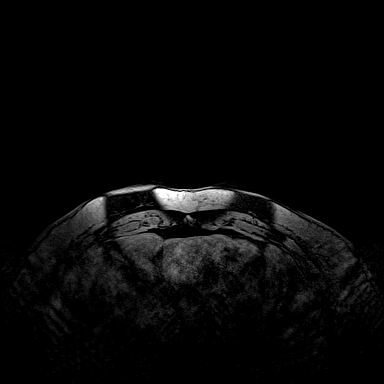
[im 36/144]
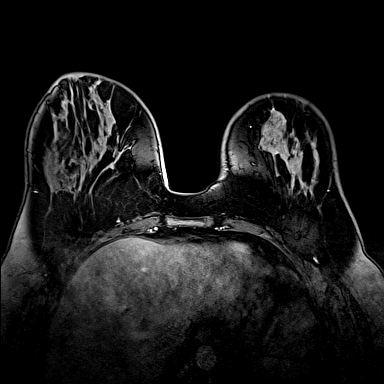
[im 72/144]
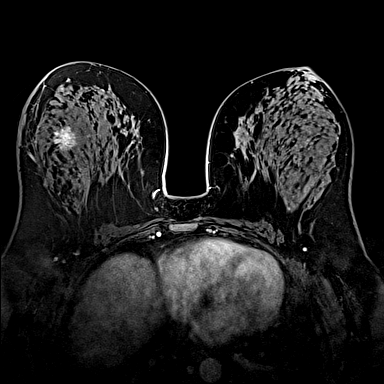
[im 108/144]
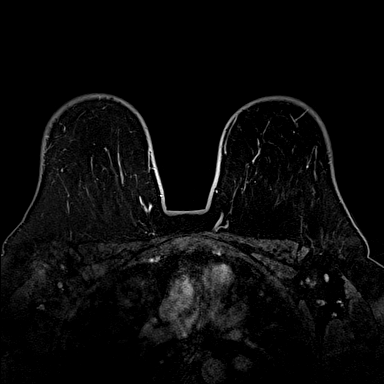
[im 144/144]
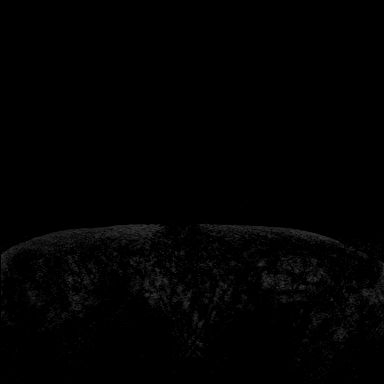

[Series 7: fl3d post-cm 20 · axial · 1.2mm · 0.94mm/px · z∈[-126,+46]mm · 5 of 144 slices shown (2 of 3)]
[im 1/144]
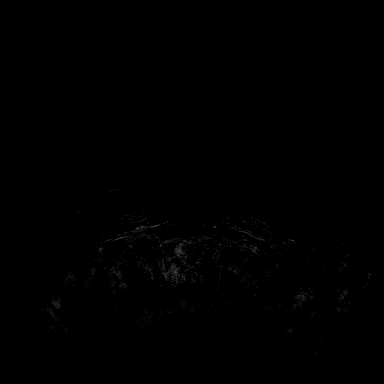
[im 36/144]
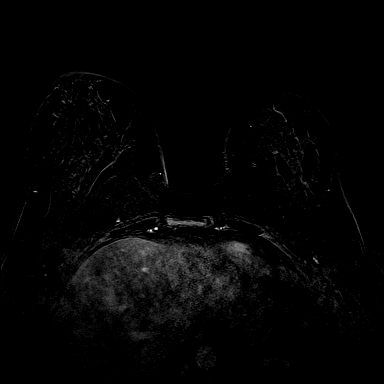
[im 72/144]
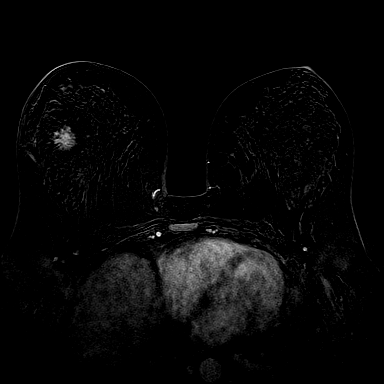
[im 108/144]
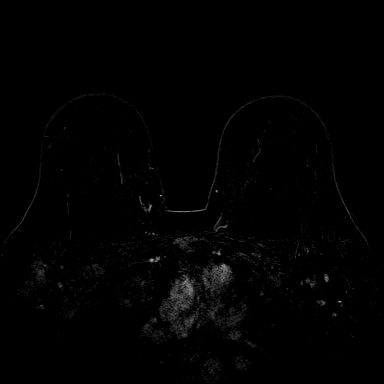
[im 144/144]
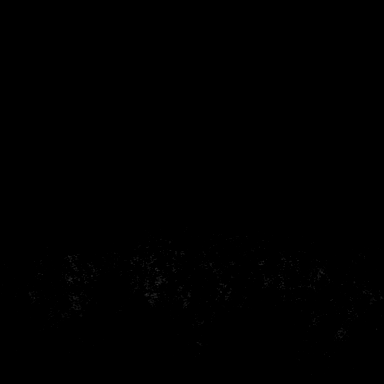

[Series 8: fl3d post-cm 20 · axial · 172.8mm · 0.94mm/px · 1 of 1 slices shown (3 of 3)]
[im 1/1]
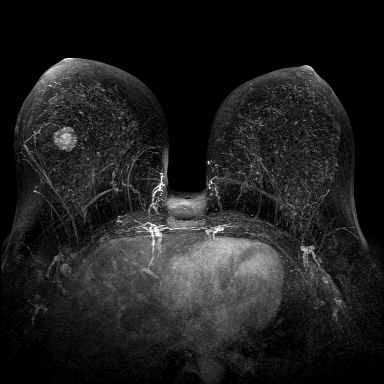

[Series 9: fl3d post-cm 3min · axial · 1.2mm · 0.94mm/px · z∈[-126,+46]mm · 5 of 144 slices shown]
[im 1/144]
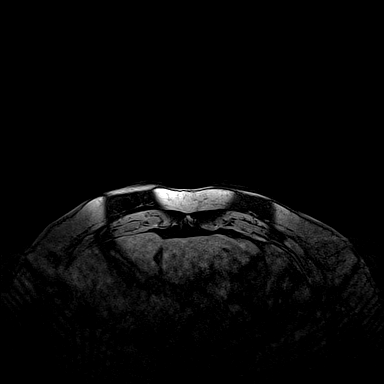
[im 36/144]
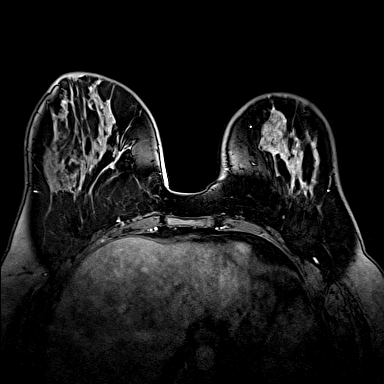
[im 72/144]
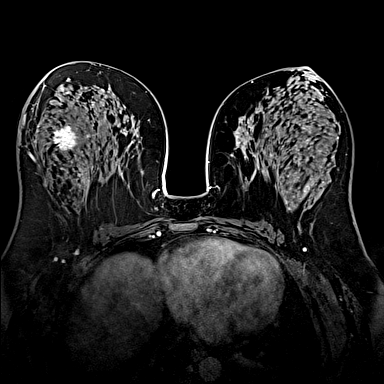
[im 108/144]
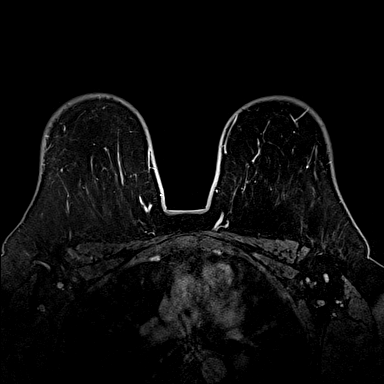
[im 144/144]
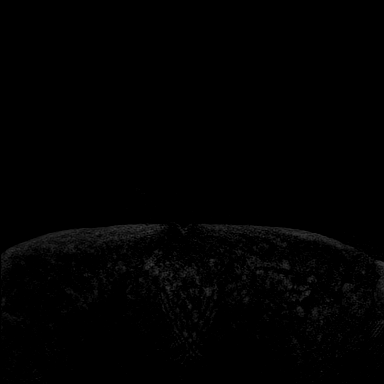

[31 of 48 positions shown; findings below may reference images not displayed]

THREE-DIMENSIONAL MR IMAGE RENDERING ON INDEPENDENT WORKSTATION:

Three-dimensional MR images were rendered by post-processing of the
original MR data on an independent workstation. The
three-dimensional MR images were interpreted, and findings are
reported in the following complete MRI report for this study. Three
dimensional images were evaluated at the independent [employer]
workstation
FINDINGS: Breast composition: d.  Extreme fibroglandular tissue.

Background parenchymal enhancement: Moderate

Right breast: A 2.2 x 2.3 x 2.3 cm irregular enhancing mass with
plateau kinetics is identified within the upper outer right breast
(middle third) compatible with biopsy proven carcinoma. Biopsy clip
artifact lies along the very anterior superior aspect of the
neoplasm.

A 2.3 cm linear area of stippled nodular enhancement within the
upper inner right breast (middle -posterior third junction) exhibits
plateau type kinetics and may represent DCIS.

No other suspicious areas of enhancement within the right breast
noted.

Left breast: No mass or abnormal enhancement.

Lymph nodes: No abnormal appearing lymph nodes.

Ancillary findings:  None.
IMPRESSION: 2.3 cm biopsy-proven malignancy within the upper-outer right breast
with biopsy clip artifact along the very anterior superior aspect of
this mass. Please note that the sonographic measurements of this
mass are an over estimation due to adjacent very dense
fibroglandular tissue.

2.3 cm linear stippled nodular enhancement within the upper inner
right breast which may represent DCIS. Recommend MR guided tissue
sampling if breast conservation will be pursued.

No evidence of left breast malignancy or abnormal appearing lymph
nodes.

RECOMMENDATION:
Recommend MR guided right breast biopsy of linear stippled nodular
enhancement within the upper inner right breast if breast
conservation will be pursued.

Otherwise treatment plan.

BI-RADS CATEGORY  4: Suspicious.

## 2016-09-10 MED ORDER — VENLAFAXINE HCL 75 MG PO TABS: 75.0000 mg | ORAL_TABLET | Freq: Every day | ORAL | 2 refills | Status: DC

## 2016-09-11 ENCOUNTER — Other Ambulatory Visit: Payer: Self-pay | Admitting: *Deleted

## 2016-09-11 DIAGNOSIS — C50411 Malignant neoplasm of upper-outer quadrant of right female breast: Secondary | ICD-10-CM

## 2016-09-11 MED ORDER — VENLAFAXINE HCL ER 75 MG PO CP24: 75.0000 mg | ORAL_CAPSULE | Freq: Every day | ORAL | 2 refills | Status: DC

## 2016-09-28 LAB — HM PAP SMEAR: HM Pap smear: NEGATIVE

## 2016-09-29 LAB — HM MAMMOGRAPHY: HM Mammogram: NORMAL (ref 0–4)

## 2016-10-21 ENCOUNTER — Telehealth: Payer: Self-pay | Admitting: Family Medicine

## 2016-10-21 DIAGNOSIS — Z0000 Encounter for general adult medical examination without abnormal findings: Secondary | ICD-10-CM

## 2016-10-21 DIAGNOSIS — R739 Hyperglycemia, unspecified: Secondary | ICD-10-CM

## 2016-10-21 NOTE — Telephone Encounter (Signed)
-----   Message from Marchia Bond sent at 10/19/2016  3:24 PM EDT ----- Regarding: Cpx labs Mon 11/13 need orders. Thanks! :-) Please order  future cpx labs for pt's upcoming lab appt. Thanks Aniceto Boss

## 2016-10-30 ENCOUNTER — Other Ambulatory Visit (INDEPENDENT_AMBULATORY_CARE_PROVIDER_SITE_OTHER): Payer: 59

## 2016-10-30 DIAGNOSIS — R739 Hyperglycemia, unspecified: Secondary | ICD-10-CM | POA: Diagnosis not present

## 2016-10-30 DIAGNOSIS — Z0000 Encounter for general adult medical examination without abnormal findings: Secondary | ICD-10-CM

## 2016-10-30 LAB — COMPREHENSIVE METABOLIC PANEL
ALK PHOS: 110 U/L (ref 39–117)
ALT: 18 U/L (ref 0–35)
AST: 17 U/L (ref 0–37)
Albumin: 4.1 g/dL (ref 3.5–5.2)
BILIRUBIN TOTAL: 0.5 mg/dL (ref 0.2–1.2)
BUN: 12 mg/dL (ref 6–23)
CALCIUM: 9.3 mg/dL (ref 8.4–10.5)
CHLORIDE: 102 meq/L (ref 96–112)
CO2: 32 mEq/L (ref 19–32)
Creatinine, Ser: 0.91 mg/dL (ref 0.40–1.20)
GFR: 68.49 mL/min (ref >60.00)
Glucose, Bld: 129 mg/dL — ABNORMAL HIGH (ref 70–99)
Potassium: 3.8 mEq/L (ref 3.5–5.1)
Sodium: 140 mEq/L (ref 135–145)
Total Protein: 7.1 g/dL (ref 6.0–8.3)

## 2016-10-30 LAB — CBC WITH DIFFERENTIAL/PLATELET
BASOS PCT: 0.4 % (ref 0.0–3.0)
Basophils Absolute: 0 10*3/uL (ref 0.0–0.1)
EOS ABS: 0.2 10*3/uL (ref 0.0–0.7)
EOS PCT: 2.9 % (ref 0.0–5.0)
HCT: 42.2 % (ref 36.0–46.0)
Hemoglobin: 14.4 g/dL (ref 12.0–15.0)
LYMPHS PCT: 26.6 % (ref 12.0–46.0)
Lymphs Abs: 1.5 10*3/uL (ref 0.7–4.0)
MCHC: 34.2 g/dL (ref 30.0–36.0)
MCV: 87.4 fl (ref 78.0–100.0)
Monocytes Absolute: 0.5 10*3/uL (ref 0.1–1.0)
Monocytes Relative: 8.4 % (ref 3.0–12.0)
Neutro Abs: 3.5 10*3/uL (ref 1.4–7.7)
Neutrophils Relative %: 61.7 % (ref 43.0–77.0)
Platelets: 254 10*3/uL (ref 150.0–400.0)
RBC: 4.82 Mil/uL (ref 3.87–5.11)
RDW: 13.6 % (ref 11.5–15.5)
WBC: 5.7 10*3/uL (ref 4.0–10.5)

## 2016-10-30 LAB — LIPID PANEL
CHOLESTEROL: 166 mg/dL (ref 0–200)
HDL: 59.4 mg/dL (ref >39.00)
LDL Cholesterol: 95 mg/dL (ref 0–99)
NonHDL: 106.8
Total CHOL/HDL Ratio: 3
Triglycerides: 60 mg/dL (ref 0.0–149.0)
VLDL: 12 mg/dL (ref 0.0–40.0)

## 2016-10-30 LAB — TSH: TSH: 3.21 u[IU]/mL (ref 0.35–4.50)

## 2016-10-30 LAB — HEMOGLOBIN A1C: Hgb A1c MFr Bld: 6 % (ref 4.6–6.5)

## 2016-11-02 ENCOUNTER — Encounter: Payer: Self-pay | Admitting: Family Medicine

## 2016-11-02 ENCOUNTER — Ambulatory Visit (INDEPENDENT_AMBULATORY_CARE_PROVIDER_SITE_OTHER): Payer: 59 | Admitting: Family Medicine

## 2016-11-02 VITALS — BP 126/84 | HR 83 | Temp 97.8°F | Ht 64.0 in | Wt 221.2 lb

## 2016-11-02 DIAGNOSIS — E6609 Other obesity due to excess calories: Secondary | ICD-10-CM

## 2016-11-02 DIAGNOSIS — Z0000 Encounter for general adult medical examination without abnormal findings: Secondary | ICD-10-CM

## 2016-11-02 DIAGNOSIS — Z6837 Body mass index (BMI) 37.0-37.9, adult: Secondary | ICD-10-CM

## 2016-11-02 DIAGNOSIS — E039 Hypothyroidism, unspecified: Secondary | ICD-10-CM

## 2016-11-02 DIAGNOSIS — I10 Essential (primary) hypertension: Secondary | ICD-10-CM | POA: Diagnosis not present

## 2016-11-02 DIAGNOSIS — L409 Psoriasis, unspecified: Secondary | ICD-10-CM

## 2016-11-02 DIAGNOSIS — Z1211 Encounter for screening for malignant neoplasm of colon: Secondary | ICD-10-CM

## 2016-11-02 DIAGNOSIS — R739 Hyperglycemia, unspecified: Secondary | ICD-10-CM

## 2016-11-02 DIAGNOSIS — IMO0001 Reserved for inherently not codable concepts without codable children: Secondary | ICD-10-CM

## 2016-11-02 MED ORDER — OMEPRAZOLE MAGNESIUM 20 MG PO TBEC: 20.0000 mg | DELAYED_RELEASE_TABLET | Freq: Every day | ORAL | 11 refills | Status: DC

## 2016-11-02 MED ORDER — LEVOTHYROXINE SODIUM 25 MCG PO TABS: ORAL_TABLET | ORAL | 11 refills | Status: DC

## 2016-11-02 MED ORDER — LOSARTAN POTASSIUM-HCTZ 50-12.5 MG PO TABS: 1.0000 | ORAL_TABLET | Freq: Every day | ORAL | 3 refills | Status: DC

## 2016-11-02 NOTE — Patient Instructions (Addendum)
If you are interested in a shingles/zoster vaccine - call your insurance to check on coverage,( you should not get it within 1 month of other vaccines) , then call us for a prescription  for it to take to a pharmacy that gives the shot , or make a nurse visit to get it here depending on your coverage  stop at check out for referral for colonoscopy Call your insurance co / tell them you are post menopausal and on Arimedex -- ask if a bone density test would be covered, let us know  Try to get 1200-1500 mg of calcium per day with at least 1000 iu of vitamin D - for bone health  Your blood sugar is borderline for diabetes - start working on lower carb/lower sugar diet  Start exercising when you can

## 2016-11-02 NOTE — Progress Notes (Signed)
Subjective:    Patient ID: Diana Deleon, female    DOB: Jul 02, 1961, 55 y.o.   MRN: [id number]  HPI Here for health maintenance exam and to review chronic medical problems    She was dx with psoriasis - worst in her ears and scalp Uses an oil from dermatology -? Name   Wt Readings from Last 3 Encounters:  11/02/16 221 lb 4 oz (100.4 kg)  07/02/16 220 lb 8 oz (100 kg)  01/04/16 227 lb 4.8 oz (103.1 kg)  wants to loose weight  Is caring for her mother (lives with her) - not a lot of time for self care  Not a very healthy diet/ does not like vegetables  No time to fit in exercise - wants to try  bmi is 37.9  Hep C screening  HIV  screening  She is low risk - and declines both   She is interested in a shingles vaccine if it is covered   Colonoscopy-has not had a colonoscopy yet  Wants to get it -perhaps after jan 1  Pap 09/29/16 Had hysterectomy in the past for fibroids   Personal hx of breast cancer - R breast with reconstruction and then reduction in the L  Mammogram just had it - was good 09/29/16 She is on an aromatase ihhibitor She has hot flashes -is on effexor xr    Bone density-has not had yet  Does not take ca and D  Has had derm exams - no moles removed  Not in the sun a lot In the fall and winter works in the yard a lot     Flu shot 09/29/16  Tetanus 7/10  bp is stable today  No cp or palpitations or headaches or edema  No side effects to medicines  BP Readings from Last 3 Encounters:  11/02/16 126/84  07/02/16 (!) 166/95  01/04/16 140/81      Hypothyroidism  Pt has no clinical changes No change in energy level/ hair or skin/ edema and no tremor Lab Results  Component Value Date   TSH 3.21 10/30/2016     Hx of hyperglycemia Lab Results  Component Value Date   HGBA1C 6.0 10/30/2016     Chemistry      Component Value Date/Time   NA 140 10/30/2016 0814   NA 142 06/27/2015 0949   K 3.8 10/30/2016 0814   K 3.5 06/27/2015 0949   CL 102  10/30/2016 0814   CO2 32 10/30/2016 0814   CO2 30 (H) 06/27/2015 0949   BUN 12 10/30/2016 0814   BUN 15.1 06/27/2015 0949   CREATININE 0.91 10/30/2016 0814   CREATININE 0.9 06/27/2015 0949      Component Value Date/Time   CALCIUM 9.3 10/30/2016 0814   CALCIUM 9.3 06/27/2015 0949   ALKPHOS 110 10/30/2016 0814   ALKPHOS 88 06/27/2015 0949   AST 17 10/30/2016 0814   AST 19 06/27/2015 0949   ALT 18 10/30/2016 0814   ALT 22 06/27/2015 0949   BILITOT 0.5 10/30/2016 0814   BILITOT 0.45 06/27/2015 0949      Lab Results  Component Value Date   WBC 5.7 10/30/2016   HGB 14.4 10/30/2016   HCT 42.2 10/30/2016   MCV 87.4 10/30/2016   PLT 254.0 10/30/2016     Cholesterol Lab Results  Component Value Date   CHOL 166 10/30/2016   HDL 59.40 10/30/2016   LDLCALC 95 10/30/2016   TRIG 60.0 10/30/2016   CHOLHDL 3 10/30/2016  Patient Active Problem List   Diagnosis Date Noted  . Psoriasis 11/02/2016  . Colon cancer screening 11/02/2016  . Obesity 10/31/2015  . Breast cancer of upper-outer quadrant of right female breast ([place]) 10/25/2014  . Hyperglycemia 07/14/2013  . Routine general medical examination at a health care facility 10/12/2012  . Hypothyroidism 06/03/2012  . Elevated transaminase level 06/03/2012  . Left foot pain 05/27/2012  . Overactive bladder 05/27/2012  . COLD SORE 07/07/2010  . VERTIGO 08/01/2009  . URINARY URGENCY 07/15/2009  . LOW BACK PAIN, CHRONIC 05/17/2008  . HEADACHE, CHRONIC 05/17/2008  . Essential hypertension 02/12/2008   Past Medical History:  Diagnosis Date  . Anemia    "off and on"  . Arthritis    "right toes" (03/24/2015)  . Cancer of right breast ([place]) 2015  . Cyst of cystic duct    chest- central location, taking Cephalexin currently  . Fibroids    hyst. 2006  . GERD (gastroesophageal reflux disease)   . History of abnormal Pap smear    has had leep in past  . History of cold sores    has used valtrex in past  . History of recurrent  UTIs   . Hypertension   . Hypothyroidism   . Intermittent vertigo   . Kidney stones   . Seborrheic dermatitis    local on R scalp and in ear (failed mult meds- uses steroid spray)  . Thyroid disease    Past Surgical History:  Procedure Laterality Date  . BREAST BIOPSY Right 10/2014; 11/2014  . BREAST RECONSTRUCTION WITH PLACEMENT OF TISSUE EXPANDER AND FLEX HD (ACELLULAR HYDRATED DERMIS) Right 11/25/2014   Procedure: RIGHT BREAST RECONSTRUCTION WITH PLACEMENT OF TISSUE EXPANDER AND USE OF ACELLULAR DERMRAL MATRIX ;  Surgeon: Crissie Reese, MD;  Location: [facility];  Service: Plastics;  Laterality: Right;  . BREAST REDUCTION SURGERY Left 03/24/2015   Procedure: LEFT BREAST REDUCTION  (BREAST);  Surgeon: Crissie Reese, MD;  Location: [facility];  Service: Plastics;  Laterality: Left;  . COLONOSCOPY    . LAPAROSCOPIC CHOLECYSTECTOMY  2003  . LITHOTRIPSY  ~ 2007  . REDUCTION MAMMAPLASTY Left 03/24/2015  . REMOVAL OF TISSUE EXPANDER Right 03/24/2015  . REMOVAL OF TISSUE EXPANDER AND PLACEMENT OF IMPLANT Right 03/24/2015   w/delayed Architect  . REMOVAL OF TISSUE EXPANDER AND PLACEMENT OF IMPLANT Right 03/24/2015   Procedure: REMOVAL OF RIGHT BREAST TISSUE EXPANDER AND DELAYED BREAST RECONSTRUCTION WITH PLACEMENT OF IMPLANT;  Surgeon: Crissie Reese, MD;  Location: [facility];  Service: Plastics;  Laterality: Right;  . SIMPLE MASTECTOMY WITH AXILLARY SENTINEL NODE BIOPSY Right 11/25/2014   Procedure: RIGHT TOTAL  MASTECTOMY WITH AXILLARY SENTINEL NODE BIOPSY ;  Surgeon: Excell Seltzer, MD;  Location: [facility];  Service: General;  Laterality: Right;  . TUBAL LIGATION    . VAGINAL DELIVERY  x3  . VAGINAL HYSTERECTOMY  2006   "fibroids"   Social History  Substance Use Topics  . Smoking status: Never Smoker  . Smokeless tobacco: Never Used  . Alcohol use No   Family History  Problem Relation Age of Onset  . Alcohol abuse Father   . COPD Father   . Hypertension Father   . Diabetes Father   . Hypertension  Mother    Allergies  Allergen Reactions  . Norvasc [Amlodipine Besylate] Swelling   Current Outpatient Prescriptions on File Prior to Visit  Medication Sig Dispense Refill  . anastrozole (ARIMIDEX) 1 MG tablet TAKE 1 TABLET (1 MG TOTAL) BY MOUTH DAILY.  90 tablet 3  . venlafaxine XR (EFFEXOR-XR) 75 MG 24 hr capsule Take 1 capsule (75 mg total) by mouth daily with breakfast. 90 capsule 2   No current facility-administered medications on file prior to visit.     Review of Systems .Review of Systems  Constitutional: Negative for fever, appetite change, fatigue and unexpected weight change.  Eyes: Negative for pain and visual disturbance.  Respiratory: Negative for cough and shortness of breath.   Cardiovascular: Negative for cp or palpitations    Gastrointestinal: Negative for nausea, diarrhea and constipation.  Genitourinary: Negative for urgency and frequency.  Skin: Negative for pallor or rash   Neurological: Negative for weakness, light-headedness, numbness and headaches.  Hematological: Negative for adenopathy. Does not bruise/bleed easily.  Psychiatric/Behavioral: Negative for dysphoric mood. The patient is not nervous/anxious.  pos for stressors        Objective:   Physical Exam  Constitutional: She appears well-developed and well-nourished. No distress.  obese and well appearing   HENT:  Head: Normocephalic and atraumatic.  Right Ear: External ear normal.  Left Ear: External ear normal.  Nose: Nose normal.  Mouth/Throat: Oropharynx is clear and moist.  Eyes: Conjunctivae and EOM are normal. Pupils are equal, round, and reactive to light. Right eye exhibits no discharge. Left eye exhibits no discharge. No scleral icterus.  Neck: Normal range of motion. Neck supple. No JVD present. Carotid bruit is not present. No thyromegaly present.  Cardiovascular: Normal rate, regular rhythm, normal heart sounds and intact distal pulses.  Exam reveals no gallop.   Pulmonary/Chest:  Effort normal and breath sounds normal. No respiratory distress. She has no wheezes. She has no rales.  Abdominal: Soft. Bowel sounds are normal. She exhibits no distension and no mass. There is no tenderness.  Genitourinary:  Genitourinary Comments: Gyn and breast exam done recently by gyn and oncol/surg  Musculoskeletal: She exhibits no edema or tenderness.  Lymphadenopathy:    She has no cervical adenopathy.  Neurological: She is alert. She has normal reflexes. No cranial nerve deficit. She exhibits normal muscle tone. Coordination normal.  Skin: Skin is warm and dry. No rash noted. No erythema. No pallor.  Some lentigines   Psoriasis-scalp/ears  Psychiatric: She has a normal mood and affect.          Assessment & Plan:   Problem List Items Addressed This Visit      Cardiovascular and Mediastinum   Essential hypertension - Primary    bp in fair control at this time  BP Readings from Last 1 Encounters:  11/02/16 126/84   No changes needed Disc lifstyle change with low sodium diet and exercise  Labs reviewed       Relevant Medications   losartan-hydrochlorothiazide (HYZAAR) 50-12.5 MG tablet     Endocrine   Hypothyroidism    Hypothyroidism  Pt has no clinical changes No change in energy level/ hair or skin/ edema and no tremor Lab Results  Component Value Date   TSH 3.21 10/30/2016          Relevant Medications   levothyroxine (SYNTHROID, LEVOTHROID) 25 MCG tablet     Musculoskeletal and Integument   Psoriasis     Other   Colon cancer screening    Ref for screening colonoscopy      Relevant Orders   Ambulatory referral to Gastroenterology   Hyperglycemia    Controlled with diet  Wt loss and low glycemic diet end to prev DM 2 Lab Results  Component Value Date  HGBA1C 6.0 10/30/2016         Obesity    Discussed how this problem influences overall health and the risks it imposes  Reviewed plan for weight loss with lower calorie diet (via better  food choices and also portion control or program like weight watchers) and exercise building up to or more than 30 minutes 5 days per week including some aerobic activity         Routine general medical examination at a health care facility    Reviewed health habits including diet and exercise and skin cancer prevention Reviewed appropriate screening tests for age  Also reviewed health mt list, fam hx and immunization status , as well as social and family history    See HPI Labs reviewed Declines hep C/HIV screening due to low risk If you are interested in a shingles/zoster vaccine - call your insurance to check on coverage,( you should not get it within 1 month of other vaccines) , then call us for a prescription  for it to take to a pharmacy that gives the shot , or make a nurse visit to get it here depending on your coverage  stop at check out for referral for colonoscopy Call your insurance co / tell them you are post menopausal and on Arimedex -- ask if a bone density test would be covered, let us know  Try to get 1200-1500 mg of calcium per day with at least 1000 iu of vitamin D - for bone health  Your blood sugar is borderline for diabetes - start working on lower carb/lower sugar diet  Start exercising when you can

## 2016-11-02 NOTE — Progress Notes (Signed)
Pre visit review using our clinic review tool, if applicable. No additional management support is needed unless otherwise documented below in the visit note. 

## 2016-11-04 NOTE — Assessment & Plan Note (Signed)
Controlled with diet  Wt loss and low glycemic diet end to prev DM 2 Lab Results  Component Value Date   HGBA1C 6.0 10/30/2016

## 2016-11-04 NOTE — Assessment & Plan Note (Signed)
Reviewed health habits including diet and exercise and skin cancer prevention Reviewed appropriate screening tests for age  Also reviewed health mt list, fam hx and immunization status , as well as social and family history    See HPI Labs reviewed Declines hep C/HIV screening due to low risk If you are interested in a shingles/zoster vaccine - call your insurance to check on coverage,( you should not get it within 1 month of other vaccines) , then call us for a prescription  for it to take to a pharmacy that gives the shot , or make a nurse visit to get it here depending on your coverage  stop at check out for referral for colonoscopy Call your insurance co / tell them you are post menopausal and on Arimedex -- ask if a bone density test would be covered, let us know  Try to get 1200-1500 mg of calcium per day with at least 1000 iu of vitamin D - for bone health  Your blood sugar is borderline for diabetes - start working on lower carb/lower sugar diet  Start exercising when you can

## 2016-11-04 NOTE — Assessment & Plan Note (Signed)
Discussed how this problem influences overall health and the risks it imposes  Reviewed plan for weight loss with lower calorie diet (via better food choices and also portion control or program like weight watchers) and exercise building up to or more than 30 minutes 5 days per week including some aerobic activity    

## 2016-11-04 NOTE — Assessment & Plan Note (Signed)
bp in fair control at this time  BP Readings from Last 1 Encounters:  11/02/16 126/84   No changes needed Disc lifstyle change with low sodium diet and exercise  Labs reviewed

## 2016-11-04 NOTE — Assessment & Plan Note (Signed)
Hypothyroidism  Pt has no clinical changes No change in energy level/ hair or skin/ edema and no tremor Lab Results  Component Value Date   TSH 3.21 10/30/2016

## 2016-11-04 NOTE — Assessment & Plan Note (Signed)
Ref for screening colonoscopy 

## 2016-11-09 ENCOUNTER — Other Ambulatory Visit: Payer: Self-pay | Admitting: Family Medicine

## 2016-11-16 ENCOUNTER — Telehealth: Payer: Self-pay

## 2016-11-16 NOTE — Telephone Encounter (Signed)
Needs to be scheduled for a screening colonoscopy. Patient has [facility], referral is in [employer]

## 2016-11-22 ENCOUNTER — Telehealth: Payer: Self-pay

## 2016-11-22 NOTE — Telephone Encounter (Signed)
Gastroenterology Pre-Procedure Review  Request Date: 12/28/2016 Requesting Physician: Dr. Glori Bickers  PATIENT REVIEW QUESTIONS: The patient responded to the following health history questions as indicated:    1. Are you having any GI issues? no 2. Do you have a personal history of Polyps? no 3. Do you have a family history of Colon Cancer or Polyps? no 4. Diabetes Mellitus? no 5. Joint replacements in the past 12 months?no 6. Major health problems in the past 3 months?no 7. Any artificial heart valves, MVP, or defibrillator?no    MEDICATIONS & ALLERGIES:    Patient reports the following regarding taking any anticoagulation/antiplatelet therapy:   Plavix, Coumadin, Eliquis, Xarelto, Lovenox, Pradaxa, Brilinta, or Effient? no Aspirin? yes (Blood Thinner)  Patient confirms/reports the following medications:  Current Outpatient Prescriptions  Medication Sig Dispense Refill  . anastrozole (ARIMIDEX) 1 MG tablet TAKE 1 TABLET (1 MG TOTAL) BY MOUTH DAILY. 90 tablet 3  . aspirin EC 81 MG tablet Take 81 mg by mouth daily.    . clotrimazole-betamethasone (LOTRISONE) cream Apply 1 application topically daily as needed.    [name] Kitchen levothyroxine (SYNTHROID, LEVOTHROID) 25 MCG tablet TAKE 1 TABLET BY MOUTH DAILY BEFORE BREAKFAST. 30 tablet 11  . losartan-hydrochlorothiazide (HYZAAR) 50-12.5 MG tablet Take 1 tablet by mouth daily. 90 tablet 3  . omeprazole (PRILOSEC OTC) 20 MG tablet Take 1 tablet (20 mg total) by mouth daily before breakfast. 30 tablet 11  . venlafaxine XR (EFFEXOR-XR) 75 MG 24 hr capsule Take 1 capsule (75 mg total) by mouth daily with breakfast. 90 capsule 2   No current facility-administered medications for this visit.     Patient confirms/reports the following allergies:  Allergies  Allergen Reactions  . Norvasc [Amlodipine Besylate] Swelling    No orders of the defined types were placed in this encounter.   AUTHORIZATION INFORMATION Primary Insurance: 1D#: Group  #:  Secondary Insurance: 1D#: Group #:  SCHEDULE INFORMATION: Date: 12/28/2016 Time: Location:[facility]

## 2016-11-22 NOTE — Telephone Encounter (Signed)
Screening Colonoscopy Z12.11 [facility] 12/28/2016 Dr. Vicente Males [facility] Pre cert

## 2016-11-23 ENCOUNTER — Other Ambulatory Visit: Payer: Self-pay

## 2016-11-27 ENCOUNTER — Other Ambulatory Visit: Payer: Self-pay | Admitting: Family Medicine

## 2016-12-09 ENCOUNTER — Other Ambulatory Visit: Payer: Self-pay | Admitting: Hematology and Oncology

## 2016-12-18 ENCOUNTER — Telehealth: Payer: Self-pay

## 2016-12-18 NOTE — Telephone Encounter (Signed)
Your Notification/Prior Authorization was received on 12/18/2016 and will be processed but a Notification/Prior Authorization Number could not be assigned at this time. Please do not resubmit this notification/prior authorization. Please print this page for your records.

## 2016-12-21 NOTE — Telephone Encounter (Signed)
  NOTIFICATION/PRIOR AUTHORIZATION NUMBER: [id number]

## 2016-12-27 ENCOUNTER — Encounter: Payer: Self-pay | Admitting: *Deleted

## 2016-12-28 ENCOUNTER — Encounter
Admission: RE | Disposition: A | Discharge status: Home / Self Care | Payer: Self-pay | Source: Ambulatory Visit | Attending: Gastroenterology

## 2016-12-28 ENCOUNTER — Ambulatory Visit: Payer: 59 | Admitting: Anesthesiology

## 2016-12-28 ENCOUNTER — Ambulatory Visit
Admission: RE | Admit: 2016-12-28 | Discharge: 2016-12-28 | Disposition: A | Discharge status: Home / Self Care | Payer: 59 | Source: Ambulatory Visit | Attending: Gastroenterology | Admitting: Gastroenterology

## 2016-12-28 DIAGNOSIS — Z8619 Personal history of other infectious and parasitic diseases: Secondary | ICD-10-CM | POA: Insufficient documentation

## 2016-12-28 DIAGNOSIS — Z6837 Body mass index (BMI) 37.0-37.9, adult: Secondary | ICD-10-CM | POA: Insufficient documentation

## 2016-12-28 DIAGNOSIS — Z87442 Personal history of urinary calculi: Secondary | ICD-10-CM | POA: Diagnosis not present

## 2016-12-28 DIAGNOSIS — M19071 Primary osteoarthritis, right ankle and foot: Secondary | ICD-10-CM | POA: Insufficient documentation

## 2016-12-28 DIAGNOSIS — Z7982 Long term (current) use of aspirin: Secondary | ICD-10-CM | POA: Diagnosis not present

## 2016-12-28 DIAGNOSIS — Z853 Personal history of malignant neoplasm of breast: Secondary | ICD-10-CM | POA: Insufficient documentation

## 2016-12-28 DIAGNOSIS — Z87440 Personal history of urinary (tract) infections: Secondary | ICD-10-CM | POA: Insufficient documentation

## 2016-12-28 DIAGNOSIS — E669 Obesity, unspecified: Secondary | ICD-10-CM | POA: Diagnosis not present

## 2016-12-28 DIAGNOSIS — K219 Gastro-esophageal reflux disease without esophagitis: Secondary | ICD-10-CM | POA: Insufficient documentation

## 2016-12-28 DIAGNOSIS — I10 Essential (primary) hypertension: Secondary | ICD-10-CM | POA: Insufficient documentation

## 2016-12-28 DIAGNOSIS — D649 Anemia, unspecified: Secondary | ICD-10-CM | POA: Insufficient documentation

## 2016-12-28 DIAGNOSIS — E039 Hypothyroidism, unspecified: Secondary | ICD-10-CM | POA: Insufficient documentation

## 2016-12-28 DIAGNOSIS — Z1211 Encounter for screening for malignant neoplasm of colon: Secondary | ICD-10-CM | POA: Insufficient documentation

## 2016-12-28 HISTORY — PX: COLONOSCOPY WITH PROPOFOL: SHX5780

## 2016-12-28 SURGERY — COLONOSCOPY WITH PROPOFOL
Anesthesia: General

## 2016-12-28 MED ORDER — SODIUM CHLORIDE 0.9 % IV SOLN: INTRAVENOUS | Status: DC

## 2016-12-28 MED ORDER — LIDOCAINE HCL (PF) 1 % IJ SOLN
INTRAMUSCULAR | Status: AC
  Administered 2016-12-28: 0.03 mL via INTRADERMAL
  Filled 2016-12-28: qty 2

## 2016-12-28 MED ORDER — SODIUM CHLORIDE 0.9 % IV SOLN
INTRAVENOUS | Status: DC
  Administered 2016-12-28: 1000 mL via INTRAVENOUS

## 2016-12-28 MED ORDER — PROPOFOL 500 MG/50ML IV EMUL
INTRAVENOUS | Status: DC | PRN
  Administered 2016-12-28: 150 ug/kg/min via INTRAVENOUS

## 2016-12-28 MED ORDER — PROPOFOL 500 MG/50ML IV EMUL
INTRAVENOUS | Status: AC
  Filled 2016-12-28: qty 50

## 2016-12-28 MED ORDER — LIDOCAINE HCL (PF) 1 % IJ SOLN
2.0000 mL | Freq: Once | INTRAMUSCULAR | Status: AC
  Administered 2016-12-28: 0.03 mL via INTRADERMAL

## 2016-12-28 MED ORDER — MIDAZOLAM HCL 2 MG/2ML IJ SOLN
INTRAMUSCULAR | Status: AC
  Filled 2016-12-28: qty 2

## 2016-12-28 MED ORDER — MIDAZOLAM HCL 5 MG/5ML IJ SOLN
INTRAMUSCULAR | Status: DC | PRN
  Administered 2016-12-28: 1 mg via INTRAVENOUS

## 2016-12-28 MED ORDER — PROPOFOL 10 MG/ML IV BOLUS
INTRAVENOUS | Status: DC | PRN
  Administered 2016-12-28: 70 mg via INTRAVENOUS
  Administered 2016-12-28: 30 mg via INTRAVENOUS

## 2016-12-28 NOTE — Transfer of Care (Signed)
Immediate Anesthesia Transfer of Care Note  Patient: Diana Deleon  Procedure(s) Performed: Procedure(s): COLONOSCOPY WITH PROPOFOL (N/A)  Patient Location: PACU  Anesthesia Type:General  Level of Consciousness: awake, alert  and oriented  Airway & Oxygen Therapy: Patient Spontanous Breathing and Patient connected to nasal cannula oxygen  Post-op Assessment: Report given to RN and Post -op Vital signs reviewed and stable  Post vital signs: Reviewed and stable  Last Vitals:  Vitals:   12/28/16 0824 12/28/16 0825  BP: (!) 138/93 (!) 138/93  Pulse: 88 87  Resp: 10 12  Temp: (!) 35.9 C (!) 35.9 C    Last Pain:  Vitals:   12/28/16 0825  TempSrc: Tympanic         Complications: No apparent anesthesia complications

## 2016-12-28 NOTE — H&P (Signed)
Jonathon Bellows MD [address], [place], [place] [address] Phone: [phone] Fax : [phone]  Primary Care Physician:  Loura Pardon, MD Primary Gastroenterologist:  Dr. Jonathon Bellows   Pre-Procedure History & Physical: HPI:  Diana Deleon is a 56 y.o. female is here for an colonoscopy.   Past Medical History:  Diagnosis Date  . Anemia    "off and on"  . Arthritis    "right toes" (03/24/2015)  . Cancer of right breast ([facility]) 2015  . Cyst of cystic duct    chest- central location, taking Cephalexin currently  . Fibroids    hyst. 2006  . GERD (gastroesophageal reflux disease)   . History of abnormal Pap smear    has had leep in past  . History of cold sores    has used valtrex in past  . History of recurrent UTIs   . Hypertension   . Hypothyroidism   . Intermittent vertigo   . Kidney stones   . Seborrheic dermatitis    local on R scalp and in ear (failed mult meds- uses steroid spray)  . Thyroid disease     Past Surgical History:  Procedure Laterality Date  . BREAST RECONSTRUCTION WITH PLACEMENT OF TISSUE EXPANDER AND FLEX HD (ACELLULAR HYDRATED DERMIS) Right 11/25/2014   Procedure: RIGHT BREAST RECONSTRUCTION WITH PLACEMENT OF TISSUE EXPANDER AND USE OF ACELLULAR DERMRAL MATRIX ;  Surgeon: Crissie Reese, MD;  Location: [facility];  Service: Plastics;  Laterality: Right;  . BREAST REDUCTION SURGERY Left 03/24/2015   Procedure: LEFT BREAST REDUCTION  (BREAST);  Surgeon: Crissie Reese, MD;  Location: [facility];  Service: Plastics;  Laterality: Left;  . COLONOSCOPY    . LAPAROSCOPIC CHOLECYSTECTOMY  2003  . LITHOTRIPSY  ~ 2007  . REMOVAL OF TISSUE EXPANDER Right 03/24/2015  . REMOVAL OF TISSUE EXPANDER AND PLACEMENT OF IMPLANT Right 03/24/2015   w/delayed Architect  . REMOVAL OF TISSUE EXPANDER AND PLACEMENT OF IMPLANT Right 03/24/2015   Procedure: REMOVAL OF RIGHT BREAST TISSUE EXPANDER AND DELAYED BREAST RECONSTRUCTION WITH PLACEMENT OF IMPLANT;  Surgeon: Crissie Reese, MD;  Location:  [facility];  Service: Plastics;  Laterality: Right;  . SIMPLE MASTECTOMY WITH AXILLARY SENTINEL NODE BIOPSY Right 11/25/2014   Procedure: RIGHT TOTAL  MASTECTOMY WITH AXILLARY SENTINEL NODE BIOPSY ;  Surgeon: Excell Seltzer, MD;  Location: [facility];  Service: General;  Laterality: Right;  . TUBAL LIGATION    . VAGINAL DELIVERY  x3  . VAGINAL HYSTERECTOMY  2006   "fibroids"    Prior to Admission medications   Medication Sig Start Date End Date Taking? Authorizing Provider  anastrozole (ARIMIDEX) 1 MG tablet TAKE 1 TABLET BY MOUTH EVERY DAY 12/11/16   Nicholas Lose, MD  aspirin EC 81 MG tablet Take 81 mg by mouth daily.    Historical Provider, MD  clotrimazole-betamethasone (LOTRISONE) cream Apply 1 application topically daily as needed. 05/22/16 05/22/17  Historical Provider, MD  levothyroxine (SYNTHROID, LEVOTHROID) 25 MCG tablet TAKE 1 TABLET BY MOUTH DAILY BEFORE BREAKFAST. 11/02/16   Abner Greenspan, MD  losartan-hydrochlorothiazide (HYZAAR) 50-12.5 MG tablet Take 1 tablet by mouth daily. 11/02/16   Abner Greenspan, MD  omeprazole (PRILOSEC OTC) 20 MG tablet Take 1 tablet (20 mg total) by mouth daily before breakfast. 11/02/16   Abner Greenspan, MD  venlafaxine XR (EFFEXOR-XR) 75 MG 24 hr capsule Take 1 capsule (75 mg total) by mouth daily with breakfast. 09/11/16   Nicholas Lose, MD    Allergies as of 11/23/2016 -  Review Complete 11/22/2016  Allergen Reaction Noted  . Norvasc [amlodipine besylate] Swelling 06/01/2011    Family History  Problem Relation Age of Onset  . Alcohol abuse Father   . COPD Father   . Hypertension Father   . Diabetes Father   . Hypertension Mother     Social History   Social History  . Marital status: Married    Spouse name: N/A  . Number of children: N/A  . Years of education: N/A   Occupational History  . Not on file.   Social History Main Topics  . Smoking status: Never Smoker  . Smokeless tobacco: Never Used  . Alcohol use No  . Drug use: No  . Sexual  activity: Yes   Other Topics Concern  . Not on file   Social History Narrative  . No narrative on file    Review of Systems: See HPI, otherwise negative ROS  Physical Exam: BP (!) 145/92   Pulse 84   Temp 97.7 F (36.5 C) (Tympanic)   Resp 18   Ht 5\' 4"  (1.626 m)   Wt 220 lb (99.8 kg)   SpO2 97%   BMI 37.76 kg/m  General:   Alert,  pleasant and cooperative in NAD Head:  Normocephalic and atraumatic. Neck:  Supple; no masses or thyromegaly. Lungs:  Clear throughout to auscultation.    Heart:  Regular rate and rhythm. Abdomen:  Soft, nontender and nondistended. Normal bowel sounds, without guarding, and without rebound.   Neurologic:  Alert and  oriented x4;  grossly normal neurologically.  Impression/Plan: Diana Deleon is here for an colonoscopy to be performed for Screening colonoscopy average risk    Risks, benefits, limitations, and alternatives regarding  colonoscopy have been reviewed with the patient.  Questions have been answered.  All parties agreeable.   Jonathon Bellows, MD  12/28/2016, 7:58 AM

## 2016-12-28 NOTE — Op Note (Signed)
[facility] Patient Name: Diana Deleon Procedure Date: 12/28/2016 8:00 AM MRN: [id number] Account #: [id number] Date of Birth: July 22, 1961 Admit Type: Outpatient Age: 56 Room: [facility] ENDO ROOM 4 Gender: Female Note Status: Finalized Procedure:            Colonoscopy Indications:          Screening for colorectal malignant neoplasm Providers:            Jonathon Bellows MD, MD Referring MD:         Wynelle Fanny. Tower (Referring MD) Medicines:            Monitored Anesthesia Care Complications:        No immediate complications. Procedure:            Pre-Anesthesia Assessment:                       - Prior to the procedure, a History and Physical was                        performed, and patient medications, allergies and                        sensitivities were reviewed. The patient's tolerance of                        previous anesthesia was reviewed.                       - The risks and benefits of the procedure and the                        sedation options and risks were discussed with the                        patient. All questions were answered and informed                        consent was obtained.                       - The risks and benefits of the procedure and the                        sedation options and risks were discussed with the                        patient. All questions were answered and informed                        consent was obtained.                       - ASA Grade Assessment: III - A patient with severe                        systemic disease.                       After obtaining informed consent, the colonoscope was  passed under direct vision. Throughout the procedure,                        the patient's blood pressure, pulse, and oxygen                        saturations were monitored continuously. The                        Colonoscope was introduced through the anus and      advanced to the the cecum, identified by the                        appendiceal orifice, IC valve and transillumination.                        The colonoscopy was performed with ease. The patient                        tolerated the procedure well. The quality of the bowel                        preparation was excellent. Findings:      The entire examined colon appeared normal on direct and retroflexion       views. Impression:           - The entire examined colon is normal on direct and                        retroflexion views.                       - No specimens collected. Recommendation:       - Patient has a contact number available for                        emergencies. The signs and symptoms of potential                        delayed complications were discussed with the patient.                        Return to normal activities tomorrow. Written discharge                        instructions were provided to the patient.                       - Resume previous diet.                       - Continue present medications.                       - Repeat colonoscopy in 10 years for screening purposes.                       - Discharge patient to home (with escort). Procedure Code(s):    --- Professional ---                       [id number], Colorectal cancer  screening; colonoscopy on                        individual not meeting criteria for high risk Diagnosis Code(s):    --- Professional ---                       Z12.11, Encounter for screening for malignant neoplasm                        of colon CPT copyright 2016 American Medical Association. All rights reserved. The codes documented in this report are preliminary and upon coder review may  be revised to meet current compliance requirements. Jonathon Bellows, MD Jonathon Bellows MD, MD 12/28/2016 8:18:00 AM This report has been signed electronically. Number of Addenda: 0 Note Initiated On: 12/28/2016 8:00 AM Scope Withdrawal Time: 0  hours 8 minutes 36 seconds  Total Procedure Duration: 0 hours 11 minutes 23 seconds       [facility]

## 2016-12-28 NOTE — Anesthesia Postprocedure Evaluation (Signed)
Anesthesia Post Note  Patient: Diana Deleon  Procedure(s) Performed: Procedure(s) (LRB): COLONOSCOPY WITH PROPOFOL (N/A)  Patient location during evaluation: Endoscopy Anesthesia Type: General Level of consciousness: awake and alert and oriented Pain management: pain level controlled Vital Signs Assessment: post-procedure vital signs reviewed and stable Respiratory status: spontaneous breathing, nonlabored ventilation and respiratory function stable Cardiovascular status: blood pressure returned to baseline and stable Postop Assessment: no signs of nausea or vomiting Anesthetic complications: no     Last Vitals:  Vitals:   12/28/16 0834 12/28/16 0844  BP: 123/83 (!) 154/97  Pulse: 79 80  Resp: 16 16  Temp:      Last Pain:  Vitals:   12/28/16 0825  TempSrc: Tympanic                 [name]

## 2016-12-28 NOTE — Anesthesia Preprocedure Evaluation (Signed)
Anesthesia Evaluation  Patient identified by MRN, date of birth, ID band Patient awake    Reviewed: Allergy & Precautions, NPO status , Patient's Chart, lab work & pertinent test results  History of Anesthesia Complications Negative for: history of anesthetic complications  Airway Mallampati: II  TM Distance: >3 FB Neck ROM: Full    Dental no notable dental hx.    Pulmonary neg pulmonary ROS, neg sleep apnea, neg COPD,    breath sounds clear to auscultation- rhonchi (-) wheezing      Cardiovascular Exercise Tolerance: Good hypertension, Pt. on medications (-) CAD and (-) Past MI  Rhythm:Regular Rate:Normal - Systolic murmurs and - Diastolic murmurs    Neuro/Psych  Headaches, negative psych ROS   GI/Hepatic GERD  ,  Endo/Other  neg diabetesHypothyroidism   Renal/GU Renal disease: hx of nephrolithiasis.     Musculoskeletal  (+) Arthritis ,   Abdominal (+) + obese,   Peds  Hematology  (+) anemia ,   Anesthesia Other Findings Past Medical History: No date: Anemia     Comment: "off and on" No date: Arthritis     Comment: "right toes" (03/24/2015) 2015: Cancer of right breast ([facility]) No date: Cyst of cystic duct     Comment: chest- central location, taking Cephalexin               currently No date: Fibroids     Comment: hyst. 2006 No date: GERD (gastroesophageal reflux disease) No date: History of abnormal Pap smear     Comment: has had leep in past No date: History of cold sores     Comment: has used valtrex in past No date: History of recurrent UTIs No date: Hypertension No date: Hypothyroidism No date: Intermittent vertigo No date: Kidney stones No date: Seborrheic dermatitis     Comment: local on R scalp and in ear (failed mult meds-              uses steroid spray) No date: Thyroid disease   Reproductive/Obstetrics                             Anesthesia Physical Anesthesia  Plan  ASA: III  Anesthesia Plan: General   Post-op Pain Management:    Induction: Intravenous  Airway Management Planned: Natural Airway  Additional Equipment:   Intra-op Plan:   Post-operative Plan:   Informed Consent: I have reviewed the patients History and Physical, chart, labs and discussed the procedure including the risks, benefits and alternatives for the proposed anesthesia with the patient or authorized representative who has indicated his/her understanding and acceptance.   Dental advisory given  Plan Discussed with: CRNA and Anesthesiologist  Anesthesia Plan Comments:         Anesthesia Quick Evaluation

## 2016-12-28 NOTE — Anesthesia Procedure Notes (Signed)
Date/Time: 12/28/2016 8:00 AM Performed by: Hedda Slade Pre-anesthesia Checklist: Patient identified, Emergency Drugs available, Suction available and Patient being monitored Oxygen Delivery Method: Nasal cannula

## 2016-12-30 ENCOUNTER — Encounter: Payer: Self-pay | Admitting: Gastroenterology

## 2017-03-04 ENCOUNTER — Other Ambulatory Visit: Payer: Self-pay | Admitting: Hematology and Oncology

## 2017-04-27 NOTE — Assessment & Plan Note (Deleted)
Right breast invasive ductal carcinoma 2.8 cm status post mastectomy grade 3 with high-grade DCIS, ALH and LCIS; separate tumor which was DCIS 1.5 cm, 1 SLN negative, ER 86%, PR 95%, HER-2 negative ratio 1.52, Ki-67 27% T2 N0 M0 stage II a, Oncotype Dx score 18 (11% ROR) Low risk; started tamoxifen 12/27/2014 Switched to anastrozole April 2016  Anastrozole Toxicities: 1. Severe hot flashes On Effexor.Markedly improved since we increased the dosage of Effexor. 2. Tingling and numbness when she lifts her arm above the head. Also improved.  Breast Cancer Surveillance: 1. Breast exam 04/29/2017 Normal 2. Mammogram done 09/29/2016 on the left breast: benign.  RTC in 1 year for follow up

## 2017-04-29 ENCOUNTER — Ambulatory Visit: Payer: 59 | Admitting: Hematology and Oncology

## 2017-06-08 ENCOUNTER — Other Ambulatory Visit: Payer: Self-pay | Admitting: Hematology and Oncology

## 2017-06-10 ENCOUNTER — Other Ambulatory Visit: Payer: Self-pay | Admitting: Hematology and Oncology

## 2017-06-10 MED ORDER — ANASTROZOLE 1 MG PO TABS: 1.0000 mg | ORAL_TABLET | Freq: Every day | ORAL | 0 refills | Status: DC

## 2017-06-10 MED ORDER — VENLAFAXINE HCL ER 75 MG PO CP24: 75.0000 mg | ORAL_CAPSULE | Freq: Every day | ORAL | 0 refills | Status: DC

## 2017-06-12 ENCOUNTER — Telehealth: Payer: Self-pay | Admitting: Hematology and Oncology

## 2017-06-12 NOTE — Telephone Encounter (Signed)
lvm to inform pt of July appt per sch msg °

## 2017-06-24 DIAGNOSIS — L400 Psoriasis vulgaris: Secondary | ICD-10-CM | POA: Diagnosis not present

## 2017-07-02 ENCOUNTER — Ambulatory Visit (HOSPITAL_BASED_OUTPATIENT_CLINIC_OR_DEPARTMENT_OTHER): Payer: 59 | Admitting: Hematology and Oncology

## 2017-07-02 ENCOUNTER — Encounter: Payer: Self-pay | Admitting: Hematology and Oncology

## 2017-07-02 DIAGNOSIS — Z79811 Long term (current) use of aromatase inhibitors: Secondary | ICD-10-CM

## 2017-07-02 DIAGNOSIS — C50411 Malignant neoplasm of upper-outer quadrant of right female breast: Secondary | ICD-10-CM

## 2017-07-02 DIAGNOSIS — Z170 Estrogen receptor positive status [ER+]: Secondary | ICD-10-CM

## 2017-07-02 MED ORDER — ANASTROZOLE 1 MG PO TABS: 1.0000 mg | ORAL_TABLET | Freq: Every day | ORAL | 3 refills | Status: DC

## 2017-07-02 MED ORDER — VENLAFAXINE HCL ER 75 MG PO CP24: 75.0000 mg | ORAL_CAPSULE | Freq: Every day | ORAL | 3 refills | Status: DC

## 2017-07-02 NOTE — Progress Notes (Signed)
Patient Care Team: Tower, Wynelle Fanny, MD as PCP - General Nicholas Lose, MD as Consulting Physician (Hematology and Oncology) Excell Seltzer, MD as Consulting Physician (General Surgery) Eppie Gibson, MD as Attending Physician (Radiation Oncology) Holley Bouche, NP as Nurse Practitioner (Nurse Practitioner)  DIAGNOSIS:  Encounter Diagnosis  Name Primary?  . Malignant neoplasm of upper-outer quadrant of right breast in female, estrogen receptor positive ([place])     SUMMARY OF ONCOLOGIC HISTORY:   Breast cancer of upper-outer quadrant of right female breast ([employer])   10/20/2014 Initial Diagnosis    Right breast invasive and in situ mammary cancer grade 2 E-cadherin positive, invasive ductal carcinoma with DCIS focal atypical cells negative for atypia and ER 70% PR 80% HER-2 negative ratio 1.26 Ki-67 27%      10/22/2014 Breast MRI    Right breast upper outer quadrant 2.3 cm biopsy-proven malignancy      11/25/2014 Surgery    Right simple mastectomy: Tumor #1 upper lateral invasive high-grade ductal carcinoma 2.8 cm plus high-grade DCIS plus ALH and LCIS; tumor #2 upper medial DCIS 1.5 cm. Margins negative.      11/25/2014 Oncotype testing    18 (11% 10-year risk of distant recurrence on Tamoxifen alone).       01/03/2015 -  Anti-estrogen oral therapy    Tamoxifen  20 mg daily discontinued 03/28/2015 and switched to anastrozole 1 mg 03/23/15 daily when she was found to be postmenopausal. Plan treatment duration 5 years      03/24/2015 Surgery    Left breast mammoplasty: Benign       CHIEF COMPLIANT: Follow-up on anastrozole therapy  INTERVAL HISTORY: Diana Deleon is a 56 year old with above-mentioned history of right breast cancer who underwent mastectomy followed by reconstruction and is currently on antiestrogen therapy with the anastrozole. She is tolerating it extremely well. Initially she had a lot of hot flashes. These got better when she developed Effexor. She is able to  tolerate this treatment fairly well. She's been on it for the past 2 and half years. She denies any pain or discomfort or lumps or nodules in the breasts. [facility] I do not have a copy of this report but apparently was normal.  REVIEW OF SYSTEMS:   Constitutional: Denies fevers, chills or abnormal weight loss Eyes: Denies blurriness of vision Ears, nose, mouth, throat, and face: Denies mucositis or sore throat Respiratory: Denies cough, dyspnea or wheezes Cardiovascular: Denies palpitation, chest discomfort Gastrointestinal:  Denies nausea, heartburn or change in bowel habits Skin: Denies abnormal skin rashes Lymphatics: Denies new lymphadenopathy or easy bruising Neurological:Denies numbness, tingling or new weaknesses Behavioral/Psych: Mood is stable, no new changes  Extremities: No lower extremity edema Breast: Right breast reconstruction. Denies any lumps or nodules in left breast All other systems were reviewed with the patient and are negative.  I have reviewed the past medical history, past surgical history, social history and family history with the patient and they are unchanged from previous note.  ALLERGIES:  is allergic to norvasc [amlodipine besylate].  MEDICATIONS:  Current Outpatient Prescriptions  Medication Sig Dispense Refill  . anastrozole (ARIMIDEX) 1 MG tablet Take 1 tablet (1 mg total) by mouth daily. 90 tablet 0  . aspirin EC 81 MG tablet Take 81 mg by mouth daily.    [name] Kitchen levothyroxine (SYNTHROID, LEVOTHROID) 25 MCG tablet TAKE 1 TABLET BY MOUTH DAILY BEFORE BREAKFAST. 30 tablet 11  . losartan-hydrochlorothiazide (HYZAAR) 50-12.5 MG  tablet Take 1 tablet by mouth daily. 90 tablet 3  . omeprazole (PRILOSEC OTC) 20 MG tablet Take 1 tablet (20 mg total) by mouth daily before breakfast. 30 tablet 11  . venlafaxine XR (EFFEXOR-XR) 75 MG 24 hr capsule Take 1 capsule (75 mg total) by mouth daily with breakfast. 90  capsule 0   No current facility-administered medications for this visit.     PHYSICAL EXAMINATION: ECOG PERFORMANCE STATUS: 1 - Symptomatic but completely ambulatory  Vitals:   07/02/17 1133  BP: (!) 160/100  Pulse: 75  Resp: 18  Temp: 98.2 F (36.8 C)   Filed Weights   07/02/17 1133  Weight: 227 lb 14.4 oz (103.4 kg)    GENERAL:alert, no distress and comfortable SKIN: skin color, texture, turgor are normal, no rashes or significant lesions EYES: normal, Conjunctiva are pink and non-injected, sclera clear OROPHARYNX:no exudate, no erythema and lips, buccal mucosa, and tongue normal  NECK: supple, thyroid normal size, non-tender, without nodularity LYMPH:  no palpable lymphadenopathy in the cervical, axillary or inguinal LUNGS: clear to auscultation and percussion with normal breathing effort HEART: regular rate & rhythm and no murmurs and no lower extremity edema ABDOMEN:abdomen soft, non-tender and normal bowel sounds MUSCULOSKELETAL:no cyanosis of digits and no clubbing  NEURO: alert & oriented x 3 with fluent speech, no focal motor/sensory deficits EXTREMITIES: No lower extremity edema BREAST: No palpable masses or nodules in either right reconstructed breast or left natural breasts. No palpable axillary supraclavicular or infraclavicular adenopathy no breast tenderness or nipple discharge. (exam performed in the presence of a chaperone)  LABORATORY DATA:  I have reviewed the data as listed   Chemistry      Component Value Date/Time   NA 140 10/30/2016 0814   NA 142 06/27/2015 0949   K 3.8 10/30/2016 0814   K 3.5 06/27/2015 0949   CL 102 10/30/2016 0814   CO2 32 10/30/2016 0814   CO2 30 (H) 06/27/2015 0949   BUN 12 10/30/2016 0814   BUN 15.1 06/27/2015 0949   CREATININE 0.91 10/30/2016 0814   CREATININE 0.9 06/27/2015 0949      Component Value Date/Time   CALCIUM 9.3 10/30/2016 0814   CALCIUM 9.3 06/27/2015 0949   ALKPHOS 110 10/30/2016 0814   ALKPHOS 88  06/27/2015 0949   AST 17 10/30/2016 0814   AST 19 06/27/2015 0949   ALT 18 10/30/2016 0814   ALT 22 06/27/2015 0949   BILITOT 0.5 10/30/2016 0814   BILITOT 0.45 06/27/2015 0949       Lab Results  Component Value Date   WBC 5.7 10/30/2016   HGB 14.4 10/30/2016   HCT 42.2 10/30/2016   MCV 87.4 10/30/2016   PLT 254.0 10/30/2016   NEUTROABS 3.5 10/30/2016    ASSESSMENT & PLAN:  Breast cancer of upper-outer quadrant of right female breast Right breast invasive ductal carcinoma 2.8 cm status post mastectomy grade 3 with high-grade DCIS, ALH and LCIS; separate tumor which was DCIS 1.5 cm, 1 SLN negative, ER 86%, PR 95%, HER-2 negative ratio 1.52, Ki-67 27% T2 N0 M0 stage II a, Oncotype Dx score 18 (11% ROR) Low risk; started tamoxifen 12/27/2014 Switched to anastrozole April 2016  Anastrozole Toxicities: 1. Severe hot flashes On Effexor.Markedly improved since we increased the dosage of Effexor. 2. Tingling and numbness when she lifts her arm above the head: Resolved  Breast Cancer Surveillance: 1. Breast exam 07/02/86 Normal 2. Mammogram done October 2017 on the left breast: benign. [facility].  RTC in 1 year for follow up.   I spent 15 minutes talking to the patient of which more than half was spent in counseling and coordination of care.  No orders of the defined types were placed in this encounter.  The patient has a good understanding of the overall plan. she agrees with it. she will call with any problems that may develop before the next visit here.   Rulon Eisenmenger, MD 07/02/17

## 2017-07-02 NOTE — Assessment & Plan Note (Signed)
Right breast invasive ductal carcinoma 2.8 cm status post mastectomy grade 3 with high-grade DCIS, ALH and LCIS; separate tumor which was DCIS 1.5 cm, 1 SLN negative, ER 86%, PR 95%, HER-2 negative ratio 1.52, Ki-67 27% T2 N0 M0 stage II a, Oncotype Dx score 18 (11% ROR) Low risk; started tamoxifen 12/27/2014 Switched to anastrozole April 2016  Anastrozole Toxicities: 1. Severe hot flashes On Effexor.Markedly improved since we increased the dosage of Effexor. 2. Tingling and numbness when she lifts her arm above the head. Also improved.  Breast Cancer Surveillance: 1. Breast exam 07/02/86 Normal 2. Mammogram done October 2017 on the left breast: benign.   RTC in 1 year for follow up.

## 2017-07-03 DIAGNOSIS — M65341 Trigger finger, right ring finger: Secondary | ICD-10-CM | POA: Insufficient documentation

## 2017-07-03 DIAGNOSIS — M2550 Pain in unspecified joint: Secondary | ICD-10-CM | POA: Diagnosis not present

## 2017-07-03 DIAGNOSIS — M25562 Pain in left knee: Secondary | ICD-10-CM | POA: Diagnosis not present

## 2017-07-03 DIAGNOSIS — G8929 Other chronic pain: Secondary | ICD-10-CM | POA: Insufficient documentation

## 2017-07-03 DIAGNOSIS — M1712 Unilateral primary osteoarthritis, left knee: Secondary | ICD-10-CM | POA: Diagnosis not present

## 2017-07-03 DIAGNOSIS — L409 Psoriasis, unspecified: Secondary | ICD-10-CM | POA: Diagnosis not present

## 2017-07-03 DIAGNOSIS — E669 Obesity, unspecified: Secondary | ICD-10-CM | POA: Insufficient documentation

## 2017-07-14 ENCOUNTER — Other Ambulatory Visit: Payer: Self-pay | Admitting: Hematology and Oncology

## 2017-07-31 DIAGNOSIS — M1712 Unilateral primary osteoarthritis, left knee: Secondary | ICD-10-CM | POA: Diagnosis not present

## 2017-10-26 ENCOUNTER — Other Ambulatory Visit: Payer: Self-pay | Admitting: Family Medicine

## 2017-11-17 ENCOUNTER — Telehealth: Payer: Self-pay | Admitting: Family Medicine

## 2017-11-17 DIAGNOSIS — R739 Hyperglycemia, unspecified: Secondary | ICD-10-CM

## 2017-11-17 DIAGNOSIS — E039 Hypothyroidism, unspecified: Secondary | ICD-10-CM

## 2017-11-17 DIAGNOSIS — I10 Essential (primary) hypertension: Secondary | ICD-10-CM

## 2017-11-17 NOTE — Telephone Encounter (Signed)
-----   Message from Ellamae Sia sent at 11/13/2017  3:55 PM EST ----- Regarding: Lab orders for Friday, 12.7.18 Patient is scheduled for CPX labs, please order future labs, Thanks , Karna Christmas

## 2017-11-22 ENCOUNTER — Other Ambulatory Visit (INDEPENDENT_AMBULATORY_CARE_PROVIDER_SITE_OTHER): Payer: 59

## 2017-11-22 DIAGNOSIS — I10 Essential (primary) hypertension: Secondary | ICD-10-CM | POA: Diagnosis not present

## 2017-11-22 DIAGNOSIS — E039 Hypothyroidism, unspecified: Secondary | ICD-10-CM

## 2017-11-22 DIAGNOSIS — R739 Hyperglycemia, unspecified: Secondary | ICD-10-CM | POA: Diagnosis not present

## 2017-11-22 LAB — CBC WITH DIFFERENTIAL/PLATELET
BASOS ABS: 0 10*3/uL (ref 0.0–0.1)
BASOS PCT: 0.5 % (ref 0.0–3.0)
Eosinophils Absolute: 0.1 10*3/uL (ref 0.0–0.7)
Eosinophils Relative: 2.7 % (ref 0.0–5.0)
HEMATOCRIT: 42.5 % (ref 36.0–46.0)
HEMOGLOBIN: 14.6 g/dL (ref 12.0–15.0)
LYMPHS PCT: 26.4 % (ref 12.0–46.0)
Lymphs Abs: 1.4 10*3/uL (ref 0.7–4.0)
MCHC: 34.4 g/dL (ref 30.0–36.0)
MCV: 90.1 fl (ref 78.0–100.0)
MONOS PCT: 8.7 % (ref 3.0–12.0)
Monocytes Absolute: 0.5 10*3/uL (ref 0.1–1.0)
NEUTROS ABS: 3.3 10*3/uL (ref 1.4–7.7)
Neutrophils Relative %: 61.7 % (ref 43.0–77.0)
PLATELETS: 240 10*3/uL (ref 150.0–400.0)
RBC: 4.72 Mil/uL (ref 3.87–5.11)
RDW: 13 % (ref 11.5–15.5)
WBC: 5.4 10*3/uL (ref 4.0–10.5)

## 2017-11-22 LAB — LIPID PANEL
CHOLESTEROL: 148 mg/dL (ref 0–200)
HDL: 58.8 mg/dL (ref >39.00)
LDL Cholesterol: 77 mg/dL (ref 0–99)
NonHDL: 89.31
Total CHOL/HDL Ratio: 3
Triglycerides: 60 mg/dL (ref 0.0–149.0)
VLDL: 12 mg/dL (ref 0.0–40.0)

## 2017-11-22 LAB — COMPREHENSIVE METABOLIC PANEL
ALT: 30 U/L (ref 0–35)
AST: 23 U/L (ref 0–37)
Albumin: 4.2 g/dL (ref 3.5–5.2)
Alkaline Phosphatase: 115 U/L (ref 39–117)
BILIRUBIN TOTAL: 0.9 mg/dL (ref 0.2–1.2)
BUN: 14 mg/dL (ref 6–23)
CALCIUM: 9.2 mg/dL (ref 8.4–10.5)
CHLORIDE: 100 meq/L (ref 96–112)
CO2: 34 meq/L — AB (ref 19–32)
Creatinine, Ser: 0.92 mg/dL (ref 0.40–1.20)
GFR: 67.36 mL/min (ref >60.00)
Glucose, Bld: 183 mg/dL — ABNORMAL HIGH (ref 70–99)
Potassium: 3.9 mEq/L (ref 3.5–5.1)
Sodium: 140 mEq/L (ref 135–145)
Total Protein: 7 g/dL (ref 6.0–8.3)

## 2017-11-22 LAB — HEMOGLOBIN A1C: HEMOGLOBIN A1C: 7.1 % — AB (ref 4.6–6.5)

## 2017-11-22 LAB — TSH: TSH: 5.22 u[IU]/mL — AB (ref 0.35–4.50)

## 2017-11-26 ENCOUNTER — Encounter: Payer: 59 | Admitting: Family Medicine

## 2017-11-26 ENCOUNTER — Other Ambulatory Visit: Payer: Self-pay | Admitting: Family Medicine

## 2017-12-19 LAB — HM DIABETES EYE EXAM

## 2017-12-25 ENCOUNTER — Encounter: Payer: Self-pay | Admitting: Family Medicine

## 2017-12-25 ENCOUNTER — Ambulatory Visit (INDEPENDENT_AMBULATORY_CARE_PROVIDER_SITE_OTHER): Payer: 59 | Admitting: Family Medicine

## 2017-12-25 VITALS — BP 156/80 | HR 88 | Temp 97.6°F | Ht 64.0 in | Wt 219.8 lb

## 2017-12-25 DIAGNOSIS — E119 Type 2 diabetes mellitus without complications: Secondary | ICD-10-CM | POA: Diagnosis not present

## 2017-12-25 DIAGNOSIS — E039 Hypothyroidism, unspecified: Secondary | ICD-10-CM | POA: Diagnosis not present

## 2017-12-25 DIAGNOSIS — C50411 Malignant neoplasm of upper-outer quadrant of right female breast: Secondary | ICD-10-CM | POA: Diagnosis not present

## 2017-12-25 DIAGNOSIS — I10 Essential (primary) hypertension: Secondary | ICD-10-CM

## 2017-12-25 DIAGNOSIS — E2839 Other primary ovarian failure: Secondary | ICD-10-CM

## 2017-12-25 DIAGNOSIS — Z6837 Body mass index (BMI) 37.0-37.9, adult: Secondary | ICD-10-CM

## 2017-12-25 DIAGNOSIS — Z23 Encounter for immunization: Secondary | ICD-10-CM

## 2017-12-25 DIAGNOSIS — E66812 Obesity, class 2: Secondary | ICD-10-CM

## 2017-12-25 DIAGNOSIS — B009 Herpesviral infection, unspecified: Secondary | ICD-10-CM

## 2017-12-25 DIAGNOSIS — Z0000 Encounter for general adult medical examination without abnormal findings: Secondary | ICD-10-CM | POA: Diagnosis not present

## 2017-12-25 DIAGNOSIS — Z170 Estrogen receptor positive status [ER+]: Secondary | ICD-10-CM

## 2017-12-25 DIAGNOSIS — Z79811 Long term (current) use of aromatase inhibitors: Secondary | ICD-10-CM | POA: Diagnosis not present

## 2017-12-25 MED ORDER — LEVOTHYROXINE SODIUM 50 MCG PO TABS: 50.0000 ug | ORAL_TABLET | Freq: Every day | ORAL | 11 refills | Status: DC

## 2017-12-25 MED ORDER — VALACYCLOVIR HCL 500 MG PO TABS: 500.0000 mg | ORAL_TABLET | Freq: Two times a day (BID) | ORAL | 3 refills | Status: DC

## 2017-12-25 MED ORDER — LOSARTAN POTASSIUM-HCTZ 100-25 MG PO TABS: 1.0000 | ORAL_TABLET | Freq: Every day | ORAL | 3 refills | Status: DC

## 2017-12-25 NOTE — Assessment & Plan Note (Signed)
Discussed how this problem influences overall health and the risks it imposes  Reviewed plan for weight loss with lower calorie diet (via better food choices and also portion control or program like weight watchers) and exercise building up to or more than 30 minutes 5 days per week including some aerobic activity   Pt is looking for indoor low impact exercise

## 2017-12-25 NOTE — Progress Notes (Signed)
Subjective:    Patient ID: Diana Deleon, female    DOB: 04/02/1961, 57 y.o.   MRN: [id number]  HPI Here for health maintenance exam and to review chronic medical problems    Working  Doing about the same    Wt Readings from Last 3 Encounters:  12/25/17 219 lb 12 oz (99.7 kg)  07/02/17 227 lb 14.4 oz (103.4 kg)  12/28/16 220 lb (99.8 kg)  she is motivated to work on wt loss now  Her daughter and family just moved out - will have more time to take care of herself   (happy about that)  Looking for a low impact exercise  37.72 kg/m   Has OA of L knee  Has to be careful  Will eventually need a knee replacement   Wants flu shot today   Mammogram 10/17- has it planned next week  Hx of breast cancer with reconstructive surgery - doing well with f/u  On arimidex -still on that (10 years)  Self breast exam   Bone density screening -needs  Not on ca and D- is intolerant of dairy now - gets gas   Tetanus shot 7/10  Pap 10/17-neg at gyn Has had a hysterectomy  Colonoscopy 1/18 nl with 10 y recall   Zoster status -interested in shingrix   bp is up today - thinks it has been high lately  No cp or palpitations or headaches or edema  No side effects to medicines  BP Readings from Last 3 Encounters:  12/25/17 (!) 156/80  07/02/17 (!) 160/100  12/28/16 (!) 139/93    Is on losartan hct- and we can increase it   Hypothyroidism  Pt has no clinical changes No change in energy level/ hair or skin/ edema and no tremor Lab Results  Component Value Date   TSH 5.22 (H) 11/22/2017    Never misses a dose    Hx of hyperglycemia Lab Results  Component Value Date   HGBA1C 7.1 (H) 11/22/2017  not a good eater  Very picky  Is open to DM teaching  This is up from 7!  Lab Results  Component Value Date   WBC 5.4 11/22/2017   HGB 14.6 11/22/2017   HCT 42.5 11/22/2017   MCV 90.1 11/22/2017   PLT 240.0 11/22/2017   Lab Results  Component Value Date   CREATININE 0.92  11/22/2017   BUN 14 11/22/2017   NA 140 11/22/2017   K 3.9 11/22/2017   CL 100 11/22/2017   CO2 34 (H) 11/22/2017   Lab Results  Component Value Date   ALT 30 11/22/2017   AST 23 11/22/2017   ALKPHOS 115 11/22/2017   BILITOT 0.9 11/22/2017    Cholesterol Lab Results  Component Value Date   CHOL 148 11/22/2017   CHOL 166 10/30/2016   CHOL 174 10/31/2015   Lab Results  Component Value Date   HDL 58.80 11/22/2017   HDL 59.40 10/30/2016   HDL 67.30 10/31/2015   Lab Results  Component Value Date   LDLCALC 77 11/22/2017   [place] 95 10/30/2016   LDLCALC 95 10/31/2015   Lab Results  Component Value Date   TRIG 60.0 11/22/2017   TRIG 60.0 10/30/2016   TRIG 62.0 10/31/2015   Lab Results  Component Value Date   CHOLHDL 3 11/22/2017   CHOLHDL 3 10/30/2016   CHOLHDL 3 10/31/2015   No results found for: LDLDIRECT   Patient Active Problem List   Diagnosis Date Noted  . Aromatase  inhibitor use 12/25/2017  . Psoriasis 11/02/2016  . Colon cancer screening 11/02/2016  . Obesity 10/31/2015  . Breast cancer of upper-outer quadrant of right female breast ([place]) 10/25/2014  . Controlled type 2 diabetes mellitus without complication, without long-term current use of insulin ([place]) 07/14/2013  . Routine general medical examination at a health care facility 10/12/2012  . Hypothyroidism 06/03/2012  . Overactive bladder 05/27/2012  . HSV infection 07/07/2010  . VERTIGO 08/01/2009  . URINARY URGENCY 07/15/2009  . LOW BACK PAIN, CHRONIC 05/17/2008  . HEADACHE, CHRONIC 05/17/2008  . Essential hypertension 02/12/2008   Past Medical History:  Diagnosis Date  . Anemia    "off and on"  . Arthritis    "right toes" (03/24/2015)  . Cancer of right breast ([place]) 2015  . Cyst of cystic duct    chest- central location, taking Cephalexin currently  . Fibroids    hyst. 2006  . GERD (gastroesophageal reflux disease)   . History of abnormal Pap smear    has had leep in past  . History  of cold sores    has used valtrex in past  . History of recurrent UTIs   . Hypertension   . Hypothyroidism   . Intermittent vertigo   . Kidney stones   . Seborrheic dermatitis    local on R scalp and in ear (failed mult meds- uses steroid spray)  . Thyroid disease    Past Surgical History:  Procedure Laterality Date  . BREAST RECONSTRUCTION WITH PLACEMENT OF TISSUE EXPANDER AND FLEX HD (ACELLULAR HYDRATED DERMIS) Right 11/25/2014   Procedure: RIGHT BREAST RECONSTRUCTION WITH PLACEMENT OF TISSUE EXPANDER AND USE OF ACELLULAR DERMRAL MATRIX ;  Surgeon: Crissie Reese, MD;  Location: [facility];  Service: Plastics;  Laterality: Right;  . BREAST REDUCTION SURGERY Left 03/24/2015   Procedure: LEFT BREAST REDUCTION  (BREAST);  Surgeon: Crissie Reese, MD;  Location: [facility];  Service: Plastics;  Laterality: Left;  . COLONOSCOPY    . COLONOSCOPY WITH PROPOFOL N/A 12/28/2016   Procedure: COLONOSCOPY WITH PROPOFOL;  Surgeon: Jonathon Bellows, MD;  Location: [facility];  Service: Endoscopy;  Laterality: N/A;  . LAPAROSCOPIC CHOLECYSTECTOMY  2003  . LITHOTRIPSY  ~ 2007  . REMOVAL OF TISSUE EXPANDER Right 03/24/2015  . REMOVAL OF TISSUE EXPANDER AND PLACEMENT OF IMPLANT Right 03/24/2015   w/delayed Architect  . REMOVAL OF TISSUE EXPANDER AND PLACEMENT OF IMPLANT Right 03/24/2015   Procedure: REMOVAL OF RIGHT BREAST TISSUE EXPANDER AND DELAYED BREAST RECONSTRUCTION WITH PLACEMENT OF IMPLANT;  Surgeon: Crissie Reese, MD;  Location: [facility];  Service: Plastics;  Laterality: Right;  . SIMPLE MASTECTOMY WITH AXILLARY SENTINEL NODE BIOPSY Right 11/25/2014   Procedure: RIGHT TOTAL  MASTECTOMY WITH AXILLARY SENTINEL NODE BIOPSY ;  Surgeon: Excell Seltzer, MD;  Location: [facility];  Service: General;  Laterality: Right;  . TUBAL LIGATION    . VAGINAL DELIVERY  x3  . VAGINAL HYSTERECTOMY  2006   "fibroids"   Social History   Tobacco Use  . Smoking status: Never Smoker  . Smokeless tobacco: Never Used  Substance Use Topics   . Alcohol use: No    Alcohol/week: 0.0 oz  . Drug use: No   Family History  Problem Relation Age of Onset  . Alcohol abuse Father   . COPD Father   . Hypertension Father   . Diabetes Father   . Hypertension Mother    Allergies  Allergen Reactions  . Norvasc [Amlodipine Besylate] Swelling   Current Outpatient Medications on  File Prior to Visit  Medication Sig Dispense Refill  . anastrozole (ARIMIDEX) 1 MG tablet Take 1 tablet (1 mg total) by mouth daily. 90 tablet 3  . aspirin EC 81 MG tablet Take 81 mg by mouth daily.    [name] Kitchen losartan-hydrochlorothiazide (HYZAAR) 50-12.5 MG tablet TAKE 1 TABLET BY MOUTH EVERY DAY 90 tablet 0  . omeprazole (PRILOSEC) 20 MG capsule TAKE 1 CAPSULE BY MOUTH DAILY BEFORE BREAKFAST 90 capsule 0  . venlafaxine XR (EFFEXOR-XR) 75 MG 24 hr capsule Take 1 capsule (75 mg total) by mouth daily with breakfast. 90 capsule 3   No current facility-administered medications on file prior to visit.     Review of Systems  Constitutional: Negative for activity change, appetite change, fatigue, fever and unexpected weight change.  HENT: Negative for congestion, ear pain, rhinorrhea, sinus pressure and sore throat.   Eyes: Negative for pain, redness and visual disturbance.  Respiratory: Negative for cough, shortness of breath and wheezing.   Cardiovascular: Negative for chest pain and palpitations.  Gastrointestinal: Negative for abdominal pain, blood in stool, constipation and diarrhea.  Endocrine: Negative for polydipsia and polyuria.  Genitourinary: Negative for dysuria, frequency and urgency.       Pos for moderate and frequent hot flashes   Musculoskeletal: Positive for arthralgias. Negative for back pain and myalgias.       Pos for pain from knee arthritis   Skin: Negative for pallor and rash.       Pos for psoriasis and HSV   Allergic/Immunologic: Negative for environmental allergies.  Neurological: Positive for headaches. Negative for dizziness and syncope.    Hematological: Negative for adenopathy. Does not bruise/bleed easily.  Psychiatric/Behavioral: Negative for decreased concentration and dysphoric mood. The patient is not nervous/anxious.        Pos for stressors        Objective:   Physical Exam  Constitutional: She appears well-developed and well-nourished. No distress.  obese and well appearing   HENT:  Head: Normocephalic and atraumatic.  Right Ear: External ear normal.  Left Ear: External ear normal.  Mouth/Throat: Oropharynx is clear and moist.  Eyes: Conjunctivae and EOM are normal. Pupils are equal, round, and reactive to light. No scleral icterus.  Neck: Normal range of motion. Neck supple. No JVD present. Carotid bruit is not present. No thyromegaly present.  Cardiovascular: Normal rate, regular rhythm, normal heart sounds and intact distal pulses. Exam reveals no gallop.  Pulmonary/Chest: Effort normal and breath sounds normal. No respiratory distress. She has no wheezes. She exhibits no tenderness.  Abdominal: Soft. Bowel sounds are normal. She exhibits no distension, no abdominal bruit and no mass. There is no tenderness.  Genitourinary: No breast swelling, tenderness, discharge or bleeding.  Genitourinary Comments: R breast reconstruction intact/no LN noted   L breast: Breast exam: No mass, nodules, thickening, tenderness, bulging, retraction, inflamation, nipple discharge or skin changes noted.  No axillary or clavicular LA.    (surgical changes from breast reduction noted)   Musculoskeletal: Normal range of motion. She exhibits no edema or tenderness.  Lymphadenopathy:    She has no cervical adenopathy.  Neurological: She is alert. She has normal reflexes. No cranial nerve deficit. She exhibits normal muscle tone. Coordination normal.  Skin: Skin is warm and dry. No rash noted. No erythema. No pallor.  Solar lentigines diffusely  Pt states she sees dermatology as well  No active psoriasis today  Psychiatric: She has  a normal mood and affect.  Assessment & Plan:   Problem List Items Addressed This Visit      Cardiovascular and Mediastinum   Essential hypertension    bp is up  Will inc losartan hct to 100-25  Update if side eff F/u 4-6 wk DASH eating/wt loss and exercise enc      Relevant Medications   losartan-hydrochlorothiazide (HYZAAR) 100-25 MG tablet     Endocrine   Controlled type 2 diabetes mellitus without complication, without long-term current use of insulin ([facility])    New DM Lab Results  Component Value Date   HGBA1C 7.1 (H) 11/22/2017   Will ref to DM teaching  Flu shot today  Eye/foot care briefly disc  Handouts given  Picky eater- diet will be a challenge Disc low impact exercise and need to wt loss  Will eventually need statin  Aiming for better bp control On arb for renal protection Fu 4-6 wk to discuss further       Relevant Medications   losartan-hydrochlorothiazide (HYZAAR) 100-25 MG tablet   Other Relevant Orders   Ambulatory referral to diabetic education   Hypothyroidism    Lab Results  Component Value Date   TSH 5.22 (H) 11/22/2017   Inc levothy to 50 mcg  F/u 4-6 wk will do labs that day      Relevant Medications   levothyroxine (SYNTHROID, LEVOTHROID) 50 MCG tablet     Other   Aromatase inhibitor use    Ref for dexa Disc risk of OP Disc imp of ca and D      Relevant Orders   DG Bone Density   Breast cancer of upper-outer quadrant of right female breast ([place])    Doing very well  arimidex - inc risk of OP  Ref for dexa  Continue oncology f/u       Relevant Medications   valACYclovir (VALTREX) 500 MG tablet   HSV infection    Px valtrex for prn use       Relevant Medications   valACYclovir (VALTREX) 500 MG tablet   Obesity    Discussed how this problem influences overall health and the risks it imposes  Reviewed plan for weight loss with lower calorie diet (via better food choices and also portion control or program  like weight watchers) and exercise building up to or more than 30 minutes 5 days per week including some aerobic activity   Pt is looking for indoor low impact exercise       Routine general medical examination at a health care facility - Primary    Other Visit Diagnoses    Estrogen deficiency       Relevant Orders   DG Bone Density   Need for influenza vaccination       Relevant Orders   Flu Vaccine QUAD 6+ mos PF IM (Fluarix Quad PF) (Completed)

## 2017-12-25 NOTE — Assessment & Plan Note (Signed)
Doing very well  arimidex - inc risk of OP  Ref for dexa  Continue oncology f/u

## 2017-12-25 NOTE — Assessment & Plan Note (Signed)
New DM Lab Results  Component Value Date   HGBA1C 7.1 (H) 11/22/2017   Will ref to DM teaching  Flu shot today  Eye/foot care briefly disc  Handouts given  Picky eater- diet will be a challenge Disc low impact exercise and need to wt loss  Will eventually need statin  Aiming for better bp control On arb for renal protection Fu 4-6 wk to discuss further

## 2017-12-25 NOTE — Patient Instructions (Addendum)
We will refer you for a bone density test   Flu shot today   Try to get 1200-1500 mg of calcium per day with at least 1000 iu of vitamin D - for bone health   Get lactaid tablets for when you have dairy   The new shingles vaccine is called Shingrix  Call your pharmacy about coverage and availability    You are now diabetic  We will refer you to diabetic teaching   Also increase your levothy to 50 mcg  Also we will double your high blood pressure medicine   Follow up in 4-6 weeks

## 2017-12-25 NOTE — Assessment & Plan Note (Signed)
Px valtrex for prn use

## 2017-12-25 NOTE — Assessment & Plan Note (Signed)
bp is up  Will inc losartan hct to 100-25  Update if side eff F/u 4-6 wk DASH eating/wt loss and exercise enc

## 2017-12-25 NOTE — Assessment & Plan Note (Signed)
Lab Results  Component Value Date   TSH 5.22 (H) 11/22/2017   Inc levothy to 50 mcg  F/u 4-6 wk will do labs that day

## 2017-12-25 NOTE — Assessment & Plan Note (Signed)
Ref for dexa Disc risk of OP Disc imp of ca and D

## 2018-01-02 ENCOUNTER — Ambulatory Visit (INDEPENDENT_AMBULATORY_CARE_PROVIDER_SITE_OTHER)
Admission: RE | Admit: 2018-01-02 | Discharge: 2018-01-02 | Disposition: A | Discharge status: Home / Self Care | Payer: 59 | Source: Ambulatory Visit | Attending: Family Medicine | Admitting: Family Medicine

## 2018-01-02 DIAGNOSIS — Z79811 Long term (current) use of aromatase inhibitors: Secondary | ICD-10-CM

## 2018-01-02 DIAGNOSIS — E2839 Other primary ovarian failure: Secondary | ICD-10-CM | POA: Diagnosis not present

## 2018-01-02 DIAGNOSIS — Z01419 Encounter for gynecological examination (general) (routine) without abnormal findings: Secondary | ICD-10-CM | POA: Diagnosis not present

## 2018-01-02 DIAGNOSIS — Z6836 Body mass index (BMI) 36.0-36.9, adult: Secondary | ICD-10-CM | POA: Diagnosis not present

## 2018-01-02 DIAGNOSIS — Z1231 Encounter for screening mammogram for malignant neoplasm of breast: Secondary | ICD-10-CM | POA: Diagnosis not present

## 2018-01-05 ENCOUNTER — Encounter: Payer: Self-pay | Admitting: Family Medicine

## 2018-01-05 DIAGNOSIS — M8580 Other specified disorders of bone density and structure, unspecified site: Secondary | ICD-10-CM | POA: Insufficient documentation

## 2018-01-09 ENCOUNTER — Telehealth: Payer: Self-pay | Admitting: *Deleted

## 2018-01-09 MED ORDER — ALENDRONATE SODIUM 70 MG PO TABS: 70.0000 mg | ORAL_TABLET | ORAL | 11 refills | Status: DC

## 2018-01-09 NOTE — Telephone Encounter (Signed)
Pt notified of Dr. Tower's comments and Rx sent to pharmacy  

## 2018-01-09 NOTE — Telephone Encounter (Signed)
-----   Message from Abner Greenspan, MD sent at 01/06/2018  1:14 PM EST ----- I can do that -thanks   Kiwana Deblasi-please send in alendronate 70 mg 1 po q week #4 11 ref  Take first thing in am weekly with glass of water 30 min before food/other drink/meds or lying down   If any side effects like heartburn/chest pain / gi symptoms alert me (or if any other side effects)  Disregard prev note about f/u  Try to get 1200-1500 mg of calcium per day with at least 1000 iu of vitamin D - for bone health  We can re check dexa in 2 y   Thanks

## 2018-01-20 ENCOUNTER — Encounter: Payer: Self-pay | Admitting: Family Medicine

## 2018-01-20 ENCOUNTER — Ambulatory Visit: Payer: 59 | Admitting: Family Medicine

## 2018-01-20 VITALS — BP 130/80 | Temp 98.2°F | Resp 12 | Ht 64.0 in | Wt 218.8 lb

## 2018-01-20 DIAGNOSIS — E119 Type 2 diabetes mellitus without complications: Secondary | ICD-10-CM

## 2018-01-20 DIAGNOSIS — I10 Essential (primary) hypertension: Secondary | ICD-10-CM | POA: Diagnosis not present

## 2018-01-20 DIAGNOSIS — Z6837 Body mass index (BMI) 37.0-37.9, adult: Secondary | ICD-10-CM

## 2018-01-20 DIAGNOSIS — E039 Hypothyroidism, unspecified: Secondary | ICD-10-CM | POA: Diagnosis not present

## 2018-01-20 LAB — BASIC METABOLIC PANEL
BUN: 16 mg/dL (ref 6–23)
CALCIUM: 9.2 mg/dL (ref 8.4–10.5)
CHLORIDE: 97 meq/L (ref 96–112)
CO2: 33 meq/L — AB (ref 19–32)
Creatinine, Ser: 0.99 mg/dL (ref 0.40–1.20)
GFR: 61.86 mL/min (ref >60.00)
Glucose, Bld: 191 mg/dL — ABNORMAL HIGH (ref 70–99)
Potassium: 3.6 mEq/L (ref 3.5–5.1)
SODIUM: 138 meq/L (ref 135–145)

## 2018-01-20 LAB — TSH: TSH: 4.45 u[IU]/mL (ref 0.35–4.50)

## 2018-01-20 NOTE — Patient Instructions (Addendum)
We will call you regarding a referral to diabetic education at [place] care of yourself  Blood pressure is improved  Try to get some exercise   Try to get most of your carbohydrates from produce (with the exception of white potatoes)  Eat less bread/pasta/rice/snack foods/cereals/sweets and other items from the middle of the grocery store (processed carbs)   Lab for chemistries and thyroid today

## 2018-01-20 NOTE — Assessment & Plan Note (Signed)
Due for TSH for inc levothy dose last time Lab Results  Component Value Date   TSH 5.22 (H) 11/22/2017    Draw today

## 2018-01-20 NOTE — Progress Notes (Signed)
Subjective:    Patient ID: Diana Deleon, female    DOB: 08-Dec-1961, 57 y.o.   MRN: [id number]  HPI Here for f/u of HTN  Feels pretty good   Has not started the fosamax yet  Plans to have a dental visit before she starts it  Doing well with arimidex for breast cancer     Wt Readings from Last 3 Encounters:  01/20/18 218 lb 12 oz (99.2 kg)  12/25/17 219 lb 12 oz (99.7 kg)  07/02/17 227 lb 14.4 oz (103.4 kg)   37.55 kg/m   Last visit inc losartan hct to 100-25  bp is improved today No cp or palpitations or headaches or edema  No side effects to medicines  BP Readings from Last 3 Encounters:  01/20/18 138/88  12/25/17 (!) 156/80  07/02/17 (!) 160/100      Also diag with DM2 last time Lab Results  Component Value Date   HGBA1C 7.1 (H) 11/22/2017   Ref to dm teaching  She has not been yet - the program had to shut down and she needs a new ref Has not started watching diet ye(low glycemic discussed) Checking feet  Not on a statin  Now on arb for renal protection   Due for check of tsh for thyroid- inc her levothy Lab Results  Component Value Date   TSH 5.22 (H) 11/22/2017   did inc her dose last time  Feels fine  Patient Active Problem List   Diagnosis Date Noted  . Osteopenia 01/05/2018  . Aromatase inhibitor use 12/25/2017  . Psoriasis 11/02/2016  . Colon cancer screening 11/02/2016  . Obesity 10/31/2015  . Breast cancer of upper-outer quadrant of right female breast ([place]) 10/25/2014  . Controlled type 2 diabetes mellitus without complication, without long-term current use of insulin ([place]) 07/14/2013  . Routine general medical examination at a health care facility 10/12/2012  . Hypothyroidism 06/03/2012  . Overactive bladder 05/27/2012  . HSV infection 07/07/2010  . VERTIGO 08/01/2009  . URINARY URGENCY 07/15/2009  . LOW BACK PAIN, CHRONIC 05/17/2008  . HEADACHE, CHRONIC 05/17/2008  . Essential hypertension 02/12/2008   Past Medical History:    Diagnosis Date  . Anemia    "off and on"  . Arthritis    "right toes" (03/24/2015)  . Cancer of right breast ([place]) 2015  . Cyst of cystic duct    chest- central location, taking Cephalexin currently  . Fibroids    hyst. 2006  . GERD (gastroesophageal reflux disease)   . History of abnormal Pap smear    has had leep in past  . History of cold sores    has used valtrex in past  . History of recurrent UTIs   . Hypertension   . Hypothyroidism   . Intermittent vertigo   . Kidney stones   . Seborrheic dermatitis    local on R scalp and in ear (failed mult meds- uses steroid spray)  . Thyroid disease    Past Surgical History:  Procedure Laterality Date  . BREAST RECONSTRUCTION WITH PLACEMENT OF TISSUE EXPANDER AND FLEX HD (ACELLULAR HYDRATED DERMIS) Right 11/25/2014   Procedure: RIGHT BREAST RECONSTRUCTION WITH PLACEMENT OF TISSUE EXPANDER AND USE OF ACELLULAR DERMRAL MATRIX ;  Surgeon: Crissie Reese, MD;  Location: [facility];  Service: Plastics;  Laterality: Right;  . BREAST REDUCTION SURGERY Left 03/24/2015   Procedure: LEFT BREAST REDUCTION  (BREAST);  Surgeon: Crissie Reese, MD;  Location: [place];  Service: Plastics;  Laterality: Left;  .  COLONOSCOPY    . COLONOSCOPY WITH PROPOFOL N/A 12/28/2016   Procedure: COLONOSCOPY WITH PROPOFOL;  Surgeon: Jonathon Bellows, MD;  Location: [facility];  Service: Endoscopy;  Laterality: N/A;  . LAPAROSCOPIC CHOLECYSTECTOMY  2003  . LITHOTRIPSY  ~ 2007  . REMOVAL OF TISSUE EXPANDER Right 03/24/2015  . REMOVAL OF TISSUE EXPANDER AND PLACEMENT OF IMPLANT Right 03/24/2015   w/delayed Architect  . REMOVAL OF TISSUE EXPANDER AND PLACEMENT OF IMPLANT Right 03/24/2015   Procedure: REMOVAL OF RIGHT BREAST TISSUE EXPANDER AND DELAYED BREAST RECONSTRUCTION WITH PLACEMENT OF IMPLANT;  Surgeon: Crissie Reese, MD;  Location: [place];  Service: Plastics;  Laterality: Right;  . SIMPLE MASTECTOMY WITH AXILLARY SENTINEL NODE BIOPSY Right 11/25/2014   Procedure: RIGHT TOTAL   MASTECTOMY WITH AXILLARY SENTINEL NODE BIOPSY ;  Surgeon: Excell Seltzer, MD;  Location: [place] [facility];  Service: General;  Laterality: Right;  . TUBAL LIGATION    . VAGINAL DELIVERY  x3  . VAGINAL HYSTERECTOMY  2006   "fibroids"   Social History   Tobacco Use  . Smoking status: Never Smoker  . Smokeless tobacco: Never Used  Substance Use Topics  . Alcohol use: No    Alcohol/week: 0.0 oz  . Drug use: No   Family History  Problem Relation Age of Onset  . Alcohol abuse Father   . COPD Father   . Hypertension Father   . Diabetes Father   . Hypertension Mother    Allergies  Allergen Reactions  . Norvasc [Amlodipine Besylate] Swelling   Current Outpatient Medications on File Prior to Visit  Medication Sig Dispense Refill  . anastrozole (ARIMIDEX) 1 MG tablet Take 1 tablet (1 mg total) by mouth daily. 90 tablet 3  . aspirin EC 81 MG tablet Take 81 mg by mouth daily.    . Calcium Carb-Cholecalciferol (CALCIUM 600/VITAMIN D3) 600-800 MG-UNIT TABS Take 2 tablets by mouth daily.    [name] Kitchen levothyroxine (SYNTHROID, LEVOTHROID) 50 MCG tablet Take 1 tablet (50 mcg total) by mouth daily. 30 tablet 11  . losartan-hydrochlorothiazide (HYZAAR) 100-25 MG tablet Take 1 tablet by mouth daily. 90 tablet 3  . omeprazole (PRILOSEC) 20 MG capsule TAKE 1 CAPSULE BY MOUTH DAILY BEFORE BREAKFAST 90 capsule 0  . valACYclovir (VALTREX) 500 MG tablet Take 1 tablet (500 mg total) by mouth 2 (two) times daily. For 5 days for an outbreak 10 tablet 3  . venlafaxine XR (EFFEXOR-XR) 75 MG 24 hr capsule Take 1 capsule (75 mg total) by mouth daily with breakfast. 90 capsule 3  . alendronate (FOSAMAX) 70 MG tablet Take 1 tablet (70 mg total) by mouth every 7 (seven) days. Take with a full glass of water on an empty stomach. (Patient not taking: Reported on 01/20/2018) 4 tablet 11   No current facility-administered medications on file prior to visit.     Review of Systems  Constitutional: Negative for activity change,  appetite change, fatigue, fever and unexpected weight change.  HENT: Negative for congestion, ear pain, rhinorrhea, sinus pressure and sore throat.   Eyes: Negative for pain, redness and visual disturbance.  Respiratory: Negative for cough, shortness of breath and wheezing.   Cardiovascular: Negative for chest pain and palpitations.  Gastrointestinal: Negative for abdominal pain, blood in stool, constipation and diarrhea.  Endocrine: Negative for polydipsia and polyuria.  Genitourinary: Negative for dysuria, frequency and urgency.  Musculoskeletal: Negative for arthralgias, back pain and myalgias.  Skin: Negative for pallor and rash.  Allergic/Immunologic: Negative for environmental allergies.  Neurological: Negative for  dizziness, syncope and headaches.  Hematological: Negative for adenopathy. Does not bruise/bleed easily.  Psychiatric/Behavioral: Negative for decreased concentration and dysphoric mood. The patient is not nervous/anxious.        Objective:   Physical Exam  Constitutional: She appears well-developed and well-nourished. No distress.  obese and well appearing   HENT:  Head: Normocephalic and atraumatic.  Mouth/Throat: Oropharynx is clear and moist.  Eyes: Conjunctivae and EOM are normal. Pupils are equal, round, and reactive to light.  Neck: Normal range of motion. Neck supple. No JVD present. Carotid bruit is not present. No thyromegaly present.  Cardiovascular: Normal rate, regular rhythm, normal heart sounds and intact distal pulses. Exam reveals no gallop.  Pulmonary/Chest: Effort normal and breath sounds normal. No respiratory distress. She has no wheezes. She has no rales.  No crackles  Abdominal: Soft. Bowel sounds are normal. She exhibits no distension, no abdominal bruit and no mass. There is no tenderness.  Musculoskeletal: She exhibits no edema.  Lymphadenopathy:    She has no cervical adenopathy.  Neurological: She is alert. She has normal reflexes. She  displays no tremor. No cranial nerve deficit. She exhibits normal muscle tone. Coordination normal.  Skin: Skin is warm and dry. No rash noted. No pallor.  Psychiatric: She has a normal mood and affect.          Assessment & Plan:   Problem List Items Addressed This Visit      Cardiovascular and Mediastinum   Essential hypertension - Primary    bp in fair control at this time  BP Readings from Last 1 Encounters:  01/20/18 130/80   No changes needed-continue losartan hct bmet today Disc lifstyle change with low sodium diet and exercise  F/u 3 mo  Work on wt loss  Ref to [employer] teaching       Relevant Orders   Basic metabolic panel     Endocrine   Controlled type 2 diabetes mellitus without complication, without long-term current use of insulin ([place])    Last visit ref to dm ed at [facility] but they closed  Re ref to [facility] for lifestyle center/dm ed  Disc low glycemic diet Starting to exercise  F/u 3 mo with lab prior  On arb Will disc statin /foot/eye care       Relevant Orders   Ambulatory referral to diabetic education   Hypothyroidism    Due for TSH for inc levothy dose last time Lab Results  Component Value Date   TSH 5.22 (H) 11/22/2017    Draw today      Relevant Orders   TSH     Other   Obesity    Discussed how this problem influences overall health and the risks it imposes  Reviewed plan for weight loss with lower calorie diet (via better food choices and also portion control or program like weight watchers) and exercise building up to or more than 30 minutes 5 days per week including some aerobic activity   She is starting to exercise  Ref to DM ed

## 2018-01-20 NOTE — Assessment & Plan Note (Signed)
Last visit ref to dm ed at [facility] but they closed  Re ref to [facility] for lifestyle center/dm ed  Disc low glycemic diet Starting to exercise  F/u 3 mo with lab prior  On arb Will disc statin /foot/eye care

## 2018-01-20 NOTE — Assessment & Plan Note (Signed)
Discussed how this problem influences overall health and the risks it imposes  Reviewed plan for weight loss with lower calorie diet (via better food choices and also portion control or program like weight watchers) and exercise building up to or more than 30 minutes 5 days per week including some aerobic activity   She is starting to exercise  Ref to DM ed

## 2018-01-20 NOTE — Assessment & Plan Note (Signed)
bp in fair control at this time  BP Readings from Last 1 Encounters:  01/20/18 130/80   No changes needed-continue losartan hct bmet today Disc lifstyle change with low sodium diet and exercise  F/u 3 mo  Work on wt loss  Ref to [facility]

## 2018-01-24 ENCOUNTER — Other Ambulatory Visit: Payer: Self-pay | Admitting: Family Medicine

## 2018-02-07 ENCOUNTER — Telehealth: Payer: Self-pay | Admitting: Family Medicine

## 2018-02-07 ENCOUNTER — Encounter: Payer: 59 | Attending: Family Medicine | Admitting: *Deleted

## 2018-02-07 ENCOUNTER — Encounter: Payer: Self-pay | Admitting: *Deleted

## 2018-02-07 VITALS — BP 134/90 | Ht 64.0 in | Wt 219.6 lb

## 2018-02-07 DIAGNOSIS — Z713 Dietary counseling and surveillance: Secondary | ICD-10-CM | POA: Insufficient documentation

## 2018-02-07 DIAGNOSIS — Z6837 Body mass index (BMI) 37.0-37.9, adult: Secondary | ICD-10-CM | POA: Insufficient documentation

## 2018-02-07 DIAGNOSIS — E1165 Type 2 diabetes mellitus with hyperglycemia: Secondary | ICD-10-CM

## 2018-02-07 DIAGNOSIS — E119 Type 2 diabetes mellitus without complications: Secondary | ICD-10-CM | POA: Diagnosis not present

## 2018-02-07 NOTE — Patient Instructions (Signed)
Check blood sugars 2 x day before breakfast and 2 hrs after one meal every day Bring blood sugar records to the next class  Call your doctor for a prescription for:  1. Meter strips (type) One Touch Verio checking  2      times per day  2. Lancets (type) One Touch Delica checking  2      times per day  Exercise: Begin walking or chair exercises  for 5-10 minutes  3 days a week and increase as tolerated  Eat 3 meals day,   1  snack a day Space meals 4-6 hours apart Don't skip meals Limit fried foods Avoid sugar sweetened drinks (soda, juices)  Return for classes on:

## 2018-02-07 NOTE — Progress Notes (Signed)
Diabetes Self-Management Education  Visit Type: First/Initial  Appt. Start Time: 1330 Appt. End Time:1445  02/07/2018  [name], identified by name and date of birth, is a 57 y.o. female with a diagnosis of Diabetes: Type 2.   ASSESSMENT  Blood pressure 134/90, height 5\' 4"  (1.626 m), weight 219 lb 9.6 oz (99.6 kg). Body mass index is 37.69 kg/m.  Diabetes Self-Management Education - 02/07/18 1537      Visit Information   Visit Type  First/Initial      Initial Visit   Diabetes Type  Type 2    Are you currently following a meal plan?  No    Are you taking your medications as prescribed?  No hasn't started Fosamax    Date Diagnosed  2 months      Health Coping   How would you rate your overall health?  Good      Psychosocial Assessment   Patient Belief/Attitude about Diabetes  Other (comment) "should have seen it coming"    Self-care barriers  None    Self-management support  Doctor's office;Family    Patient Concerns  Nutrition/Meal planning;Weight Control    Special Needs  None    Preferred Learning Style  Auditory    Learning Readiness  Contemplating    How often do you need to have someone help you when you read instructions, pamphlets, or other written materials from your doctor or pharmacy?  1 - Never    What is the last grade level you completed in school?  12th      Pre-Education Assessment   Patient understands the diabetes disease and treatment process.  Needs Instruction    Patient understands incorporating nutritional management into lifestyle.  Needs Instruction    Patient undertands incorporating physical activity into lifestyle.  Needs Instruction    Patient understands using medications safely.  Needs Instruction    Patient understands monitoring blood glucose, interpreting and using results  Needs Instruction    Patient understands prevention, detection, and treatment of acute complications.  Needs Instruction    Patient understands prevention,  detection, and treatment of chronic complications.  Needs Instruction    Patient understands how to develop strategies to address psychosocial issues.  Needs Instruction    Patient understands how to develop strategies to promote health/change behavior.  Needs Instruction      Complications   Last HgB A1C per patient/outside source  7.1 % 11/22/17    How often do you check your blood sugar?  0 times/day (not testing) Provided One Touch Verio Flex meter and instructed on use. BG upon return demonstration was 303 mg/dL at 2:25 pm - 2 hrs pp.     Have you had a dilated eye exam in the past 12 months?  Yes    Have you had a dental exam in the past 12 months?  Yes    Are you checking your feet?  No      Dietary Intake   Breakfast  occasionally skips - flavored instant oatmeal; on week-ends eggs, pancakes, waffles, cereal    Lunch  eats out 5 x week for lunch - hamburger and fries, grilled chicken, BBQ, chinese, chicken-n-dumplings    Dinner  sometimes skips - pizza, eggs, spaghetti, chicken casserole, potatoes, bread, corn, salads with lettuce, celery, cuccumbers, onions, carrots    Beverage(s)  fruit juice, regular soft dirnks, water      Exercise   Exercise Type  ADL's      Patient Education  Previous Diabetes Education  No    Disease state   Definition of diabetes, type 1 and 2, and the diagnosis of diabetes;Factors that contribute to the development of diabetes    Nutrition management   Role of diet in the treatment of diabetes and the relationship between the three main macronutrients and blood glucose level;Reviewed blood glucose goals for pre and post meals and how to evaluate the patients' food intake on their blood glucose level.    Physical activity and exercise   Role of exercise on diabetes management, blood pressure control and cardiac health.    Monitoring  Taught/evaluated SMBG meter.;Purpose and frequency of SMBG.;Taught/discussed recording of test results and interpretation of  SMBG.;Identified appropriate SMBG and/or A1C goals.    Chronic complications  Relationship between chronic complications and blood glucose control    Psychosocial adjustment  Identified and addressed patients feelings and concerns about diabetes      Individualized Goals (developed by patient)   Reducing Risk Lose weight Become more fit     Outcomes   Expected Outcomes  Demonstrated interest in learning. Expect positive outcomes    Future DMSE  2 wks       Individualized Plan for Diabetes Self-Management Training:   Learning Objective:  Patient will have a greater understanding of diabetes self-management. Patient education plan is to attend individual and/or group sessions per assessed needs and concerns.   Plan:   Patient Instructions  Check blood sugars 2 x day before breakfast and 2 hrs after one meal every day Bring blood sugar records to the next class Call your doctor for a prescription for:  1. Meter strips (type) One Touch Verio checking  2      times per day  2. Lancets (type) One Touch Delica checking  2      times per day Exercise: Begin walking or chair exercises  for 5-10 minutes  3 days a week and increase as tolerated Eat 3 meals day,   1  snack a day Space meals 4-6 hours apart Don't skip meals Limit fried foods Avoid sugar sweetened drinks (soda, juices)  Expected Outcomes:  Demonstrated interest in learning. Expect positive outcomes  Education material provided:  General Meal Planning Guidelines Simple Meal Plan Meter = One Touch Verio Flex  If problems or questions, patient to contact team via:  Johny Drilling, [place], [place], CDE [phone]  Future DSME appointment: 2 wks  February 17, 2018 for Diabetes Class 1

## 2018-02-07 NOTE — Telephone Encounter (Signed)
Copied from [place] [id number]. Topic: Quick Communication - Rx Refill/Question >> Feb 07, 2018  2:44 PM Percell Belt A wrote: Medication:  Test strip  one touch verio  and the lancets one touch Dlica    Has the patient contacted their pharmacy? no   (Agent: If no, request that the patient contact the pharmacy for the refill.)   Preferred Pharmacy (with phone number or street name): [facility] on [address]    Agent: Please be advised that RX refills may take up to 3 business days. We ask that you follow-up with your pharmacy.

## 2018-02-08 NOTE — Telephone Encounter (Signed)
See patient's request below

## 2018-02-10 MED ORDER — GLUCOSE BLOOD VI STRP: ORAL_STRIP | 1 refills | Status: DC

## 2018-02-10 MED ORDER — ONETOUCH DELICA LANCETS FINE MISC: 1 refills | Status: AC

## 2018-02-10 NOTE — Telephone Encounter (Signed)
Rxs sent

## 2018-02-17 ENCOUNTER — Encounter: Payer: 59 | Attending: Family Medicine | Admitting: Dietician

## 2018-02-17 ENCOUNTER — Encounter: Payer: Self-pay | Admitting: Dietician

## 2018-02-17 VITALS — Ht 64.0 in | Wt 221.2 lb

## 2018-02-17 DIAGNOSIS — E119 Type 2 diabetes mellitus without complications: Secondary | ICD-10-CM | POA: Insufficient documentation

## 2018-02-17 DIAGNOSIS — Z713 Dietary counseling and surveillance: Secondary | ICD-10-CM | POA: Diagnosis not present

## 2018-02-17 DIAGNOSIS — Z6837 Body mass index (BMI) 37.0-37.9, adult: Secondary | ICD-10-CM | POA: Insufficient documentation

## 2018-02-17 DIAGNOSIS — E1165 Type 2 diabetes mellitus with hyperglycemia: Secondary | ICD-10-CM

## 2018-02-17 NOTE — Progress Notes (Signed)

## 2018-02-24 ENCOUNTER — Encounter: Payer: 59 | Admitting: Dietician

## 2018-02-24 VITALS — Wt 219.8 lb

## 2018-02-24 DIAGNOSIS — Z713 Dietary counseling and surveillance: Secondary | ICD-10-CM | POA: Diagnosis not present

## 2018-02-24 DIAGNOSIS — E1165 Type 2 diabetes mellitus with hyperglycemia: Secondary | ICD-10-CM

## 2018-02-24 NOTE — Progress Notes (Signed)

## 2018-02-26 ENCOUNTER — Telehealth: Payer: Self-pay | Admitting: *Deleted

## 2018-02-26 NOTE — Telephone Encounter (Signed)
Dr. Glori Bickers put a note on pt's blood sugar reading saying:  "Please let pt know I want to start her on Metformin. ? If agreeable-let me know"  Called pt and advise of Dr. Marliss Coots comments and she was okay with taking Metformin, uses [facility] on file.   Blood sugar reading sent for scanning

## 2018-02-27 MED ORDER — METFORMIN HCL 500 MG PO TABS: 500.0000 mg | ORAL_TABLET | Freq: Two times a day (BID) | ORAL | 11 refills | Status: DC

## 2018-02-27 NOTE — Telephone Encounter (Signed)
I sent it  If any problems please let me know

## 2018-03-03 ENCOUNTER — Encounter: Payer: 59 | Admitting: Dietician

## 2018-03-03 ENCOUNTER — Encounter: Payer: Self-pay | Admitting: Dietician

## 2018-03-03 VITALS — BP 160/100 | Ht 64.0 in | Wt 220.5 lb

## 2018-03-03 DIAGNOSIS — Z713 Dietary counseling and surveillance: Secondary | ICD-10-CM | POA: Diagnosis not present

## 2018-03-03 DIAGNOSIS — E1165 Type 2 diabetes mellitus with hyperglycemia: Secondary | ICD-10-CM

## 2018-03-03 NOTE — Progress Notes (Signed)

## 2018-03-12 ENCOUNTER — Encounter: Payer: Self-pay | Admitting: *Deleted

## 2018-03-20 ENCOUNTER — Ambulatory Visit: Payer: Self-pay | Admitting: *Deleted

## 2018-03-20 NOTE — Telephone Encounter (Signed)
I will see her then  

## 2018-03-20 NOTE — Telephone Encounter (Signed)
Pt has appt with Dr Glori Bickers on 03/21/18 at 3:45.

## 2018-03-20 NOTE — Telephone Encounter (Signed)
Pt just started taking metformin and starting having kidney issues back pain -not muscle and it comes and goes and would like to talk with someone is this possibly a side effect     Patient is calling to report she is having back pain that she is unsure of the cause. Patient is suspicious of kidney response- she is having a hard time keeping her glucose levels WNL even with the new medication. She first thought she was having muscle pain- but feels it is more "inside". Appointment offered for today- but patient can not come- she scheduled for tomorrow.  Reason for Disposition . [1] MODERATE back pain (e.g., interferes with normal activities) AND [2] present > 3 days  Answer Assessment - Initial Assessment Questions 1. ONSET: "When did the pain begin?"      Saturday/Sunday 2. LOCATION: "Where does it hurt?" (upper, mid or lower back)     Lower left side 3. SEVERITY: "How bad is the pain?"  (e.g., Scale 1-10; mild, moderate, or severe)   - MILD (1-3): doesn't interfere with normal activities    - MODERATE (4-7): interferes with normal activities or awakens from sleep    - SEVERE (8-10): excruciating pain, unable to do any normal activities      6-7 4. PATTERN: "Is the pain constant?" (e.g., yes, no; constant, intermittent)      Only when patient moves a certain way 5. RADIATION: "Does the pain shoot into your legs or elsewhere?"     no 6. CAUSE:  "What do you think is causing the back pain?"      muscular vs kidney 7. BACK OVERUSE:  "Any recent lifting of heavy objects, strenuous work or exercise?"     Moved furniture 3 weeks ago 8. MEDICATIONS: "What have you taken so far for the pain?" (e.g., nothing, acetaminophen, NSAIDS)     no 9. NEUROLOGIC SYMPTOMS: "Do you have any weakness, numbness, or problems with bowel/bladder control?"     no 10. OTHER SYMPTOMS: "Do you have any other symptoms?" (e.g., fever, abdominal pain, burning with urination, blood in urine)       no 11. PREGNANCY:  "Is there any chance you are pregnant?" (e.g., yes, no; LMP)       n/a  Protocols used: BACK PAIN-A-AH

## 2018-03-21 ENCOUNTER — Encounter: Payer: Self-pay | Admitting: Family Medicine

## 2018-03-21 ENCOUNTER — Ambulatory Visit: Payer: 59 | Admitting: Family Medicine

## 2018-03-21 VITALS — BP 142/78 | HR 88 | Temp 98.0°F | Ht 64.0 in | Wt 221.0 lb

## 2018-03-21 DIAGNOSIS — M545 Low back pain, unspecified: Secondary | ICD-10-CM

## 2018-03-21 LAB — POC URINALSYSI DIPSTICK (AUTOMATED)
Bilirubin, UA: 1
Blood, UA: NEGATIVE
Glucose, UA: NEGATIVE
NITRITE UA: NEGATIVE
PH UA: 6 (ref 5.0–8.0)
Protein, UA: NEGATIVE
Spec Grav, UA: >=1.03 — AB (ref 1.010–1.025)
UROBILINOGEN UA: 0.2 U/dL

## 2018-03-21 MED ORDER — METHOCARBAMOL 500 MG PO TABS: 500.0000 mg | ORAL_TABLET | Freq: Three times a day (TID) | ORAL | 1 refills | Status: DC

## 2018-03-21 NOTE — Patient Instructions (Addendum)
Aim for 64 oz of water per day  Your urine is concentrated /and you have signs of dehydration  Keep working on it   I think you may have back strain causing pain  Use gentle heat- 10 minutes at a time Look at the handout and see what you can do without discomfort  Generic robaxin is ok for pain and spasm (caution of sedation)   Continue the meloxicam   We will culture your urine and alert you when it returns

## 2018-03-21 NOTE — Progress Notes (Signed)
Subjective:    Patient ID: Diana Deleon, female    DOB: 23-Mar-1961, 57 y.o.   MRN: [id number]  HPI  Here for back pain   No urinary symptoms  No urgency or dysuria No blood in urine    Pain in lower back  Thought at first it was muscle pain (after moving furniture)  Then thought it wasn't   Monday-Tuesday pain scale 3-4 /10 -worse when voiding  Thursday- got worse  This afternoon about the same  Shuffling a little bit   Is on the L side  Does get worse with position- bending forward and getting off the potty  Walking-not a change Sitting is ok  Moving around after sitting is worse  Uncomfortable in bed    Wt Readings from Last 3 Encounters:  03/21/18 221 lb (100.2 kg)  03/03/18 220 lb 8 oz (100 kg)  02/24/18 219 lb 12.8 oz (99.7 kg)   37.93 kg/m    Has hx of overactive bladder in the past and has seen urology  Results for orders placed or performed in visit on 03/21/18  POCT Urinalysis Dipstick (Automated)  Result Value Ref Range   Color, UA Yellow    Clarity, UA Clear    Glucose, UA Negative    Bilirubin, UA 1 mg/dL    Ketones, UA Trace    Spec Grav, UA >=1.030 (A) 1.010 - 1.025   Blood, UA Negative    pH, UA 6.0 5.0 - 8.0   Protein, UA Negative    Urobilinogen, UA 0.2 0.2 or 1.0 E.U./dL   Nitrite, UA Negative    Leukocytes, UA Trace (A) Negative   concentrated Tr leuk   Is on arimidex for breast cancer   Patient Active Problem List   Diagnosis Date Noted  . Low back pain 03/21/2018  . Osteopenia 01/05/2018  . Aromatase inhibitor use 12/25/2017  . Psoriasis 11/02/2016  . Colon cancer screening 11/02/2016  . Obesity 10/31/2015  . Breast cancer of upper-outer quadrant of right female breast ([place]) 10/25/2014  . Controlled type 2 diabetes mellitus without complication, without long-term current use of insulin ([place]) 07/14/2013  . Routine general medical examination at a health care facility 10/12/2012  . Hypothyroidism 06/03/2012  . Overactive  bladder 05/27/2012  . HSV infection 07/07/2010  . VERTIGO 08/01/2009  . URINARY URGENCY 07/15/2009  . LOW BACK PAIN, CHRONIC 05/17/2008  . HEADACHE, CHRONIC 05/17/2008  . Essential hypertension 02/12/2008   Past Medical History:  Diagnosis Date  . Anemia    "off and on"  . Arthritis    "right toes" (03/24/2015)  . Cancer of right breast ([place]) 2015  . Cyst of cystic duct    chest- central location, taking Cephalexin currently  . Diabetes mellitus without complication ([place])   . Fibroids    hyst. 2006  . GERD (gastroesophageal reflux disease)   . History of abnormal Pap smear    has had leep in past  . History of cold sores    has used valtrex in past  . History of recurrent UTIs   . Hypertension   . Hypothyroidism   . Intermittent vertigo   . Kidney stones   . Seborrheic dermatitis    local on R scalp and in ear (failed mult meds- uses steroid spray)  . Thyroid disease    Past Surgical History:  Procedure Laterality Date  . BREAST RECONSTRUCTION WITH PLACEMENT OF TISSUE EXPANDER AND FLEX HD (ACELLULAR HYDRATED DERMIS) Right 11/25/2014   Procedure: RIGHT  BREAST RECONSTRUCTION WITH PLACEMENT OF TISSUE EXPANDER AND USE OF ACELLULAR DERMRAL MATRIX ;  Surgeon: Crissie Reese, MD;  Location: [facility];  Service: Plastics;  Laterality: Right;  . BREAST REDUCTION SURGERY Left 03/24/2015   Procedure: LEFT BREAST REDUCTION  (BREAST);  Surgeon: Crissie Reese, MD;  Location: [facility];  Service: Plastics;  Laterality: Left;  . COLONOSCOPY    . COLONOSCOPY WITH PROPOFOL N/A 12/28/2016   Procedure: COLONOSCOPY WITH PROPOFOL;  Surgeon: Jonathon Bellows, MD;  Location: [facility];  Service: Endoscopy;  Laterality: N/A;  . LAPAROSCOPIC CHOLECYSTECTOMY  2003  . LITHOTRIPSY  ~ 2007  . REMOVAL OF TISSUE EXPANDER Right 03/24/2015  . REMOVAL OF TISSUE EXPANDER AND PLACEMENT OF IMPLANT Right 03/24/2015   w/delayed Architect  . REMOVAL OF TISSUE EXPANDER AND PLACEMENT OF IMPLANT Right 03/24/2015   Procedure:  REMOVAL OF RIGHT BREAST TISSUE EXPANDER AND DELAYED BREAST RECONSTRUCTION WITH PLACEMENT OF IMPLANT;  Surgeon: Crissie Reese, MD;  Location: [place] [facility];  Service: Plastics;  Laterality: Right;  . SIMPLE MASTECTOMY WITH AXILLARY SENTINEL NODE BIOPSY Right 11/25/2014   Procedure: RIGHT TOTAL  MASTECTOMY WITH AXILLARY SENTINEL NODE BIOPSY ;  Surgeon: Excell Seltzer, MD;  Location: [place];  Service: General;  Laterality: Right;  . TUBAL LIGATION    . VAGINAL DELIVERY  x3  . VAGINAL HYSTERECTOMY  2006   "fibroids"   Social History   Tobacco Use  . Smoking status: Never Smoker  . Smokeless tobacco: Never Used  Substance Use Topics  . Alcohol use: No    Alcohol/week: 0.0 oz  . Drug use: No   Family History  Problem Relation Age of Onset  . Alcohol abuse Father   . COPD Father   . Hypertension Father   . Hypertension Mother    Allergies  Allergen Reactions  . Norvasc [Amlodipine Besylate] Swelling   Current Outpatient Medications on File Prior to Visit  Medication Sig Dispense Refill  . anastrozole (ARIMIDEX) 1 MG tablet Take 1 tablet (1 mg total) by mouth daily. 90 tablet 3  . aspirin EC 81 MG tablet Take 81 mg by mouth daily.    [name] Kitchen augmented betamethasone dipropionate (DIPROLENE-AF) 0.05 % ointment Apply 1 application topically daily as needed.     . Calcium Carb-Cholecalciferol (CALCIUM 600/VITAMIN D3) 600-800 MG-UNIT TABS Take 2 tablets by mouth daily.    . Fluocinolone Acetonide Body 0.01 % OIL Apply 1 application topically daily as needed.     [name] Kitchen glucose blood (ONETOUCH VERIO) test strip Use to check blood sugar once daily and as needed (dx. E11.9) 100 each 1  . levothyroxine (SYNTHROID, LEVOTHROID) 50 MCG tablet Take 1 tablet (50 mcg total) by mouth daily. 30 tablet 11  . losartan-hydrochlorothiazide (HYZAAR) 100-25 MG tablet Take 1 tablet by mouth daily. 90 tablet 3  . meloxicam (MOBIC) 7.5 MG tablet Take 7.5 mg by mouth daily.    . metFORMIN (GLUCOPHAGE) 500 MG tablet Take 1 tablet  (500 mg total) by mouth 2 (two) times daily with a meal. 60 tablet 11  . omeprazole (PRILOSEC) 20 MG capsule TAKE 1 CAPSULE BY MOUTH DAILY BEFORE BREAKFAST 90 capsule 1  . ONETOUCH DELICA LANCETS FINE MISC Use to check blood sugar once daily and as needed (dx. E11.9) 100 each 1  . valACYclovir (VALTREX) 500 MG tablet Take 1 tablet (500 mg total) by mouth 2 (two) times daily. For 5 days for an outbreak 10 tablet 3  . venlafaxine XR (EFFEXOR-XR) 75 MG 24 hr capsule Take 1  capsule (75 mg total) by mouth daily with breakfast. 90 capsule 3  . alendronate (FOSAMAX) 70 MG tablet Take 1 tablet (70 mg total) by mouth every 7 (seven) days. Take with a full glass of water on an empty stomach. (Patient not taking: Reported on 03/21/2018) 4 tablet 11   No current facility-administered medications on file prior to visit.     Review of Systems  Constitutional: Negative for activity change, appetite change, fatigue, fever and unexpected weight change.  HENT: Negative for congestion, ear pain, rhinorrhea, sinus pressure and sore throat.   Eyes: Negative for pain, redness and visual disturbance.  Respiratory: Negative for cough, shortness of breath and wheezing.   Cardiovascular: Negative for chest pain and palpitations.  Gastrointestinal: Negative for abdominal pain, blood in stool, constipation and diarrhea.  Endocrine: Negative for polydipsia and polyuria.  Genitourinary: Negative for dysuria, frequency and urgency.  Musculoskeletal: Positive for back pain. Negative for arthralgias, joint swelling and myalgias.  Skin: Negative for pallor and rash.  Allergic/Immunologic: Negative for environmental allergies.  Neurological: Negative for dizziness, syncope and headaches.  Hematological: Negative for adenopathy. Does not bruise/bleed easily.  Psychiatric/Behavioral: Negative for decreased concentration and dysphoric mood. The patient is not nervous/anxious.        Objective:   Physical Exam  Constitutional:  She appears well-developed and well-nourished. No distress.  obese and well appearing   HENT:  Head: Normocephalic and atraumatic.  Eyes: Pupils are equal, round, and reactive to light. Conjunctivae and EOM are normal. No scleral icterus.  Neck: Normal range of motion. Neck supple.  Cardiovascular: Normal rate and regular rhythm.  Pulmonary/Chest: Effort normal and breath sounds normal. She has no wheezes. She has no rales.  Abdominal: Soft. Bowel sounds are normal. She exhibits no distension. There is no tenderness.  Musculoskeletal: She exhibits tenderness.       Right shoulder: She exhibits decreased range of motion and tenderness. She exhibits no bony tenderness, no swelling, no crepitus and no deformity.       Lumbar back: She exhibits decreased range of motion, tenderness and spasm. She exhibits no bony tenderness and no edema.  Tight L lumbar musculature Neg slr Nl rom hips  Some pain on full flexion and L sided lateral bend  No bony tenderness   Lymphadenopathy:    She has no cervical adenopathy.  Neurological: She is alert. She has normal strength and normal reflexes. She displays no atrophy. No cranial nerve deficit or sensory deficit. She exhibits normal muscle tone. Coordination normal.  Negative SLR  Skin: Skin is warm and dry. No rash noted. No erythema. No pallor.  Psychiatric: She has a normal mood and affect.          Assessment & Plan:   Problem List Items Addressed This Visit      Other   Low back pain    Now L sided w/o neuro symptoms Reassuring exam Suspect strain  Will culture urine (tr leuk) - also enc water intake due to concentration  Doubt urinary cause of pain  meloxicam- with fluids Robaxin prn with caution of sedation  Disc imp of stretching/heat  Handout for back rehab given Update if not starting to improve in a week or if worsening        Relevant Medications   methocarbamol (ROBAXIN) 500 MG tablet   Other Relevant Orders   Urine  Culture (Completed)   POCT Urinalysis Dipstick (Automated) (Completed)   LOW BACK PAIN, CHRONIC - Primary   Relevant  Medications   methocarbamol (ROBAXIN) 500 MG tablet   Other Relevant Orders   Urine Culture (Completed)   POCT Urinalysis Dipstick (Automated) (Completed)

## 2018-03-22 LAB — URINE CULTURE
MICRO NUMBER:: 90423302
Result:: NO GROWTH
SPECIMEN QUALITY: ADEQUATE

## 2018-03-23 NOTE — Assessment & Plan Note (Signed)
Now L sided w/o neuro symptoms Reassuring exam Suspect strain  Will culture urine (tr leuk) - also enc water intake due to concentration  Doubt urinary cause of pain  meloxicam- with fluids Robaxin prn with caution of sedation  Disc imp of stretching/heat  Handout for back rehab given Update if not starting to improve in a week or if worsening

## 2018-04-21 ENCOUNTER — Ambulatory Visit: Payer: 59 | Admitting: Primary Care

## 2018-04-21 ENCOUNTER — Encounter: Payer: Self-pay | Admitting: Primary Care

## 2018-04-21 ENCOUNTER — Other Ambulatory Visit (INDEPENDENT_AMBULATORY_CARE_PROVIDER_SITE_OTHER): Payer: 59

## 2018-04-21 ENCOUNTER — Ambulatory Visit (INDEPENDENT_AMBULATORY_CARE_PROVIDER_SITE_OTHER)
Admission: RE | Admit: 2018-04-21 | Discharge: 2018-04-21 | Disposition: A | Discharge status: Home / Self Care | Payer: 59 | Source: Ambulatory Visit | Attending: Primary Care | Admitting: Primary Care

## 2018-04-21 VITALS — BP 130/78 | HR 83 | Temp 98.1°F | Ht 64.0 in | Wt 220.2 lb

## 2018-04-21 DIAGNOSIS — I10 Essential (primary) hypertension: Secondary | ICD-10-CM | POA: Diagnosis not present

## 2018-04-21 DIAGNOSIS — R05 Cough: Secondary | ICD-10-CM | POA: Diagnosis not present

## 2018-04-21 DIAGNOSIS — E119 Type 2 diabetes mellitus without complications: Secondary | ICD-10-CM | POA: Diagnosis not present

## 2018-04-21 DIAGNOSIS — J22 Unspecified acute lower respiratory infection: Secondary | ICD-10-CM

## 2018-04-21 LAB — COMPREHENSIVE METABOLIC PANEL
ALK PHOS: 106 U/L (ref 39–117)
ALT: 29 U/L (ref 0–35)
AST: 20 U/L (ref 0–37)
Albumin: 3.9 g/dL (ref 3.5–5.2)
BILIRUBIN TOTAL: 0.6 mg/dL (ref 0.2–1.2)
BUN: 12 mg/dL (ref 6–23)
CALCIUM: 9.3 mg/dL (ref 8.4–10.5)
CO2: 34 meq/L — AB (ref 19–32)
Chloride: 98 mEq/L (ref 96–112)
Creatinine, Ser: 0.95 mg/dL (ref 0.40–1.20)
GFR: 64.82 mL/min (ref >60.00)
GLUCOSE: 181 mg/dL — AB (ref 70–99)
POTASSIUM: 4.3 meq/L (ref 3.5–5.1)
Sodium: 139 mEq/L (ref 135–145)
Total Protein: 7.1 g/dL (ref 6.0–8.3)

## 2018-04-21 LAB — LIPID PANEL
Cholesterol: 171 mg/dL (ref 0–200)
HDL: 60.7 mg/dL (ref >39.00)
LDL Cholesterol: 92 mg/dL (ref 0–99)
NonHDL: 109.96
Total CHOL/HDL Ratio: 3
Triglycerides: 89 mg/dL (ref 0.0–149.0)
VLDL: 17.8 mg/dL (ref 0.0–40.0)

## 2018-04-21 LAB — HEMOGLOBIN A1C: Hgb A1c MFr Bld: 7.4 % — ABNORMAL HIGH (ref 4.6–6.5)

## 2018-04-21 MED ORDER — BENZONATATE 200 MG PO CAPS: 200.0000 mg | ORAL_CAPSULE | Freq: Three times a day (TID) | ORAL | 0 refills | Status: DC | PRN

## 2018-04-21 MED ORDER — ALBUTEROL SULFATE HFA 108 (90 BASE) MCG/ACT IN AERS: 2.0000 | INHALATION_SPRAY | RESPIRATORY_TRACT | 0 refills | Status: DC | PRN

## 2018-04-21 NOTE — Patient Instructions (Addendum)
Complete xray(s) prior to leaving today. I will notify you of your results once received.  Shortness of Breath/Wheezing/Cough: Use the albuterol inhaler. Inhale 2 puffs into the lungs every 4 to 6 hours as needed for wheezing, cough, and/or shortness of breath.   You may take Benzonatate capsules for cough. Take 1 capsule by mouth three times daily as needed for cough.  I'll be in touch later today. It was a pleasure meeting you!

## 2018-04-21 NOTE — Progress Notes (Signed)
Subjective:    Patient ID: Diana Deleon, female    DOB: September 16, 1961, 57 y.o.   MRN: [id number]  HPI  Diana Deleon is a 57 year old female with a history of type 2 diabetes, breast cancer, hypertension managed on ARB who presents today with a chief complaint of cough.  She also reports sore throat and headache. Her symptoms began four days ago after cleaning her house. She wasn't using any harsh chemicals. Her symptoms became worse two days ago, feels slightly better today. Her cough is non productive. She denies fevers. She's taken Robitussin OTC with some improvement.   Review of Systems  Constitutional: Negative for chills, fatigue and fever.  HENT: Positive for congestion. Negative for sore throat.   Respiratory: Positive for cough, chest tightness, shortness of breath and wheezing.   Musculoskeletal: Positive for myalgias.  Neurological: Positive for headaches.       Past Medical History:  Diagnosis Date  . Anemia    "off and on"  . Arthritis    "right toes" (03/24/2015)  . Cancer of right breast ([place]) 2015  . Cyst of cystic duct    chest- central location, taking Cephalexin currently  . Diabetes mellitus without complication ([place])   . Fibroids    hyst. 2006  . GERD (gastroesophageal reflux disease)   . History of abnormal Pap smear    has had leep in past  . History of cold sores    has used valtrex in past  . History of recurrent UTIs   . Hypertension   . Hypothyroidism   . Intermittent vertigo   . Kidney stones   . Seborrheic dermatitis    local on R scalp and in ear (failed mult meds- uses steroid spray)  . Thyroid disease      Social History   Socioeconomic History  . Marital status: Married    Spouse name: Not on file  . Number of children: Not on file  . Years of education: Not on file  . Highest education level: Not on file  Occupational History  . Not on file  Social Needs  . Financial resource strain: Not on file  . Food insecurity:    Worry: Not  on file    Inability: Not on file  . Transportation needs:    Medical: Not on file    Non-medical: Not on file  Tobacco Use  . Smoking status: Never Smoker  . Smokeless tobacco: Never Used  Substance and Sexual Activity  . Alcohol use: No    Alcohol/week: 0.0 oz  . Drug use: No  . Sexual activity: Yes  Lifestyle  . Physical activity:    Days per week: Not on file    Minutes per session: Not on file  . Stress: Not on file  Relationships  . Social connections:    Talks on phone: Not on file    Gets together: Not on file    Attends religious service: Not on file    Active member of club or organization: Not on file    Attends meetings of clubs or organizations: Not on file    Relationship status: Not on file  . Intimate partner violence:    Fear of current or ex partner: Not on file    Emotionally abused: Not on file    Physically abused: Not on file    Forced sexual activity: Not on file  Other Topics Concern  . Not on file  Social History Narrative  .  Not on file    Past Surgical History:  Procedure Laterality Date  . BREAST RECONSTRUCTION WITH PLACEMENT OF TISSUE EXPANDER AND FLEX HD (ACELLULAR HYDRATED DERMIS) Right 11/25/2014   Procedure: RIGHT BREAST RECONSTRUCTION WITH PLACEMENT OF TISSUE EXPANDER AND USE OF ACELLULAR DERMRAL MATRIX ;  Surgeon: Crissie Reese, MD;  Location: [facility];  Service: Plastics;  Laterality: Right;  . BREAST REDUCTION SURGERY Left 03/24/2015   Procedure: LEFT BREAST REDUCTION  (BREAST);  Surgeon: Crissie Reese, MD;  Location: [facility];  Service: Plastics;  Laterality: Left;  . COLONOSCOPY    . COLONOSCOPY WITH PROPOFOL N/A 12/28/2016   Procedure: COLONOSCOPY WITH PROPOFOL;  Surgeon: Jonathon Bellows, MD;  Location: [facility];  Service: Endoscopy;  Laterality: N/A;  . LAPAROSCOPIC CHOLECYSTECTOMY  2003  . LITHOTRIPSY  ~ 2007  . REMOVAL OF TISSUE EXPANDER Right 03/24/2015  . REMOVAL OF TISSUE EXPANDER AND PLACEMENT OF IMPLANT Right 03/24/2015   w/delayed  Architect  . REMOVAL OF TISSUE EXPANDER AND PLACEMENT OF IMPLANT Right 03/24/2015   Procedure: REMOVAL OF RIGHT BREAST TISSUE EXPANDER AND DELAYED BREAST RECONSTRUCTION WITH PLACEMENT OF IMPLANT;  Surgeon: Crissie Reese, MD;  Location: [facility];  Service: Plastics;  Laterality: Right;  . SIMPLE MASTECTOMY WITH AXILLARY SENTINEL NODE BIOPSY Right 11/25/2014   Procedure: RIGHT TOTAL  MASTECTOMY WITH AXILLARY SENTINEL NODE BIOPSY ;  Surgeon: Excell Seltzer, MD;  Location: [place];  Service: General;  Laterality: Right;  . TUBAL LIGATION    . VAGINAL DELIVERY  x3  . VAGINAL HYSTERECTOMY  2006   "fibroids"    Family History  Problem Relation Age of Onset  . Alcohol abuse Father   . COPD Father   . Hypertension Father   . Hypertension Mother     Allergies  Allergen Reactions  . Norvasc [Amlodipine Besylate] Swelling    Current Outpatient Medications on File Prior to Visit  Medication Sig Dispense Refill  . anastrozole (ARIMIDEX) 1 MG tablet Take 1 tablet (1 mg total) by mouth daily. 90 tablet 3  . aspirin EC 81 MG tablet Take 81 mg by mouth daily.    [name] Kitchen augmented betamethasone dipropionate (DIPROLENE-AF) 0.05 % ointment Apply 1 application topically daily as needed.     . Calcium Carb-Cholecalciferol (CALCIUM 600/VITAMIN D3) 600-800 MG-UNIT TABS Take 2 tablets by mouth daily.    . Fluocinolone Acetonide Body 0.01 % OIL Apply 1 application topically daily as needed.     [name] Kitchen glucose blood (ONETOUCH VERIO) test strip Use to check blood sugar once daily and as needed (dx. E11.9) 100 each 1  . levothyroxine (SYNTHROID, LEVOTHROID) 50 MCG tablet Take 1 tablet (50 mcg total) by mouth daily. 30 tablet 11  . losartan-hydrochlorothiazide (HYZAAR) 100-25 MG tablet Take 1 tablet by mouth daily. 90 tablet 3  . meloxicam (MOBIC) 7.5 MG tablet Take 7.5 mg by mouth daily.    . metFORMIN (GLUCOPHAGE) 500 MG tablet Take 1 tablet (500 mg total) by mouth 2 (two) times daily with a meal. 60 tablet 11  .  methocarbamol (ROBAXIN) 500 MG tablet Take 1 tablet (500 mg total) by mouth 3 (three) times daily. 30 tablet 1  . omeprazole (PRILOSEC) 20 MG capsule TAKE 1 CAPSULE BY MOUTH DAILY BEFORE BREAKFAST 90 capsule 1  . ONETOUCH DELICA LANCETS FINE MISC Use to check blood sugar once daily and as needed (dx. E11.9) 100 each 1  . valACYclovir (VALTREX) 500 MG tablet Take 1 tablet (500 mg total) by mouth 2 (two) times daily. For 5  days for an outbreak 10 tablet 3  . venlafaxine XR (EFFEXOR-XR) 75 MG 24 hr capsule Take 1 capsule (75 mg total) by mouth daily with breakfast. 90 capsule 3  . alendronate (FOSAMAX) 70 MG tablet Take 1 tablet (70 mg total) by mouth every 7 (seven) days. Take with a full glass of water on an empty stomach. (Patient not taking: Reported on 03/21/2018) 4 tablet 11   No current facility-administered medications on file prior to visit.     BP 130/78   Pulse 83   Temp 98.1 F (36.7 C) (Oral)   Ht 5\' 4"  (1.626 m)   Wt 220 lb 4 oz (99.9 kg)   SpO2 96%   BMI 37.81 kg/m    Objective:   Physical Exam  Constitutional: She appears well-nourished. She does not appear ill.  HENT:  Right Ear: Tympanic membrane and ear canal normal.  Left Ear: Tympanic membrane and ear canal normal.  Nose: Right sinus exhibits no maxillary sinus tenderness and no frontal sinus tenderness. Left sinus exhibits no maxillary sinus tenderness and no frontal sinus tenderness.  Mouth/Throat: Oropharynx is clear and moist.  Eyes: Conjunctivae are normal.  Neck: Neck supple.  Cardiovascular: Normal rate and regular rhythm.  Pulmonary/Chest: Effort normal. No respiratory distress. She has no decreased breath sounds. She has wheezes in the right upper field and the right lower field. She has rhonchi in the left upper field and the left lower field. She has no rales.  Lymphadenopathy:    She has no cervical adenopathy.  Skin: Skin is warm and dry.          Assessment & Plan:  Acute Respiratory Tract  Infection:  Cough, congestion x 4 days. Exam today with moderate wheezing throughout, mostly prominent to upper fields. Rhonchi noted to lung bases. She doesn't appear ill but lung sounds are suspicious for a non asthmatic and non smoker. Chest xray pending today. Rx for albuterol and Tessalon Perles sent to pharmacy. Hold on antibiotics for now, could still be viral. Await chest xray results.   Pleas Koch, NP

## 2018-04-25 ENCOUNTER — Encounter: Payer: Self-pay | Admitting: Family Medicine

## 2018-04-25 ENCOUNTER — Ambulatory Visit: Payer: 59 | Admitting: Family Medicine

## 2018-04-25 VITALS — BP 132/85 | HR 90 | Temp 98.1°F | Ht 64.0 in | Wt 221.2 lb

## 2018-04-25 DIAGNOSIS — Z6837 Body mass index (BMI) 37.0-37.9, adult: Secondary | ICD-10-CM

## 2018-04-25 DIAGNOSIS — I10 Essential (primary) hypertension: Secondary | ICD-10-CM

## 2018-04-25 DIAGNOSIS — E119 Type 2 diabetes mellitus without complications: Secondary | ICD-10-CM

## 2018-04-25 MED ORDER — METFORMIN HCL 1000 MG PO TABS: 1000.0000 mg | ORAL_TABLET | Freq: Two times a day (BID) | ORAL | 3 refills | Status: DC

## 2018-04-25 NOTE — Patient Instructions (Signed)
Try to stick to a diabetic diet the best you can  More raw veggies   Also exercise - perhaps water exercise or bike would be easier on knees  Aim for 30 minutes or more per day   Increase metformin to 1000 mg twice daily -if diarrhea is too bad let me know   Weight loss will help this and blood pressure   Weight watchers is a great program

## 2018-04-25 NOTE — Progress Notes (Signed)
Subjective:    Patient ID: Diana Deleon, female    DOB: 1961-12-10, 57 y.o.   MRN: [id number]  HPI Here for f/u of chronic health problems  Feeling about the same  Has been sick this week with a cough  No fever -just a cough     Wt Readings from Last 3 Encounters:  04/25/18 221 lb 4 oz (100.4 kg)  04/21/18 220 lb 4 oz (99.9 kg)  03/21/18 221 lb (100.2 kg)  weight looks stable  Diet is the same/ super picky eater  Working on exercise (some chair exercise)  37.98 kg/m    bp is up on first check today  BP: 132/85  Improved on 2nd check  No cp or palpitations or headaches or edema  No side effects to medicines  BP Readings from Last 3 Encounters:  04/25/18 (!) 142/90  04/21/18 130/78  03/21/18 (!) 142/78  she thinks her bp is up a bit more often    Did increase you losartan hct last time   Pulse Readings from Last 3 Encounters:  04/25/18 90  04/21/18 83  03/21/18 88    Lab Results  Component Value Date   CREATININE 0.95 04/21/2018   BUN 12 04/21/2018   NA 139 04/21/2018   K 4.3 04/21/2018   CL 98 04/21/2018   CO2 34 (H) 04/21/2018    Lab Results  Component Value Date   ALT 29 04/21/2018   AST 20 04/21/2018   ALKPHOS 106 04/21/2018   BILITOT 0.6 04/21/2018    Lab Results  Component Value Date   TSH 4.45 01/20/2018     DM2 Lab Results  Component Value Date   HGBA1C 7.4 (H) 04/21/2018  up from 7.1  Did diabetic teaching-learned a lot but is a really picky eater  She did start to eat breakfast  Diet - time consuming , is able to eat salads  Last eye exam -needs to make one Arb for renal protection  Metformin 500 bid - still struggling to get her bp to move (usually 180 or above)  Not on a statin   Lab Results  Component Value Date   CHOL 171 04/21/2018   HDL 60.70 04/21/2018   LDLCALC 92 04/21/2018   TRIG 89.0 04/21/2018   CHOLHDL 3 04/21/2018   Patient Active Problem List   Diagnosis Date Noted  . Low back pain 03/21/2018  . Osteopenia  01/05/2018  . Aromatase inhibitor use 12/25/2017  . Psoriasis 11/02/2016  . Colon cancer screening 11/02/2016  . Obesity 10/31/2015  . Breast cancer of upper-outer quadrant of right female breast ([place]) 10/25/2014  . Controlled type 2 diabetes mellitus without complication, without long-term current use of insulin ([place]) 07/14/2013  . Routine general medical examination at a health care facility 10/12/2012  . Hypothyroidism 06/03/2012  . Overactive bladder 05/27/2012  . HSV infection 07/07/2010  . VERTIGO 08/01/2009  . URINARY URGENCY 07/15/2009  . LOW BACK PAIN, CHRONIC 05/17/2008  . HEADACHE, CHRONIC 05/17/2008  . Essential hypertension 02/12/2008   Past Medical History:  Diagnosis Date  . Anemia    "off and on"  . Arthritis    "right toes" (03/24/2015)  . Cancer of right breast ([place]) 2015  . Cyst of cystic duct    chest- central location, taking Cephalexin currently  . Diabetes mellitus without complication ([place])   . Fibroids    hyst. 2006  . GERD (gastroesophageal reflux disease)   . History of abnormal Pap smear  has had leep in past  . History of cold sores    has used valtrex in past  . History of recurrent UTIs   . Hypertension   . Hypothyroidism   . Intermittent vertigo   . Kidney stones   . Seborrheic dermatitis    local on R scalp and in ear (failed mult meds- uses steroid spray)  . Thyroid disease    Past Surgical History:  Procedure Laterality Date  . BREAST RECONSTRUCTION WITH PLACEMENT OF TISSUE EXPANDER AND FLEX HD (ACELLULAR HYDRATED DERMIS) Right 11/25/2014   Procedure: RIGHT BREAST RECONSTRUCTION WITH PLACEMENT OF TISSUE EXPANDER AND USE OF ACELLULAR DERMRAL MATRIX ;  Surgeon: Crissie Reese, MD;  Location: [facility];  Service: Plastics;  Laterality: Right;  . BREAST REDUCTION SURGERY Left 03/24/2015   Procedure: LEFT BREAST REDUCTION  (BREAST);  Surgeon: Crissie Reese, MD;  Location: [facility];  Service: Plastics;  Laterality: Left;  . COLONOSCOPY    .  COLONOSCOPY WITH PROPOFOL N/A 12/28/2016   Procedure: COLONOSCOPY WITH PROPOFOL;  Surgeon: Jonathon Bellows, MD;  Location: [facility];  Service: Endoscopy;  Laterality: N/A;  . LAPAROSCOPIC CHOLECYSTECTOMY  2003  . LITHOTRIPSY  ~ 2007  . REMOVAL OF TISSUE EXPANDER Right 03/24/2015  . REMOVAL OF TISSUE EXPANDER AND PLACEMENT OF IMPLANT Right 03/24/2015   w/delayed Architect  . REMOVAL OF TISSUE EXPANDER AND PLACEMENT OF IMPLANT Right 03/24/2015   Procedure: REMOVAL OF RIGHT BREAST TISSUE EXPANDER AND DELAYED BREAST RECONSTRUCTION WITH PLACEMENT OF IMPLANT;  Surgeon: Crissie Reese, MD;  Location: [place];  Service: Plastics;  Laterality: Right;  . SIMPLE MASTECTOMY WITH AXILLARY SENTINEL NODE BIOPSY Right 11/25/2014   Procedure: RIGHT TOTAL  MASTECTOMY WITH AXILLARY SENTINEL NODE BIOPSY ;  Surgeon: Excell Seltzer, MD;  Location: [facility];  Service: General;  Laterality: Right;  . TUBAL LIGATION    . VAGINAL DELIVERY  x3  . VAGINAL HYSTERECTOMY  2006   "fibroids"   Social History   Tobacco Use  . Smoking status: Never Smoker  . Smokeless tobacco: Never Used  Substance Use Topics  . Alcohol use: No    Alcohol/week: 0.0 oz  . Drug use: No   Family History  Problem Relation Age of Onset  . Alcohol abuse Father   . COPD Father   . Hypertension Father   . Hypertension Mother    Allergies  Allergen Reactions  . Norvasc [Amlodipine Besylate] Swelling   Current Outpatient Medications on File Prior to Visit  Medication Sig Dispense Refill  . albuterol (PROVENTIL HFA;VENTOLIN HFA) 108 (90 Base) MCG/ACT inhaler Inhale 2 puffs into the lungs every 4 (four) hours as needed for wheezing or shortness of breath (cough). 1 Inhaler 0  . alendronate (FOSAMAX) 70 MG tablet Take 1 tablet (70 mg total) by mouth every 7 (seven) days. Take with a full glass of water on an empty stomach. 4 tablet 11  . anastrozole (ARIMIDEX) 1 MG tablet Take 1 tablet (1 mg total) by mouth daily. 90 tablet 3  . aspirin EC 81 MG  tablet Take 81 mg by mouth daily.    [name] Kitchen augmented betamethasone dipropionate (DIPROLENE-AF) 0.05 % ointment Apply 1 application topically daily as needed.     . benzonatate (TESSALON) 200 MG capsule Take 1 capsule (200 mg total) by mouth 3 (three) times daily as needed for cough. 30 capsule 0  . Calcium Carb-Cholecalciferol (CALCIUM 600/VITAMIN D3) 600-800 MG-UNIT TABS Take 2 tablets by mouth daily.    . Fluocinolone Acetonide Body 0.01 %  OIL Apply 1 application topically daily as needed.     [name] Kitchen glucose blood (ONETOUCH VERIO) test strip Use to check blood sugar once daily and as needed (dx. E11.9) 100 each 1  . levothyroxine (SYNTHROID, LEVOTHROID) 50 MCG tablet Take 1 tablet (50 mcg total) by mouth daily. 30 tablet 11  . losartan-hydrochlorothiazide (HYZAAR) 100-25 MG tablet Take 1 tablet by mouth daily. 90 tablet 3  . meloxicam (MOBIC) 7.5 MG tablet Take 7.5 mg by mouth daily.    . methocarbamol (ROBAXIN) 500 MG tablet Take 1 tablet (500 mg total) by mouth 3 (three) times daily. 30 tablet 1  . omeprazole (PRILOSEC) 20 MG capsule TAKE 1 CAPSULE BY MOUTH DAILY BEFORE BREAKFAST 90 capsule 1  . ONETOUCH DELICA LANCETS FINE MISC Use to check blood sugar once daily and as needed (dx. E11.9) 100 each 1  . valACYclovir (VALTREX) 500 MG tablet Take 1 tablet (500 mg total) by mouth 2 (two) times daily. For 5 days for an outbreak 10 tablet 3  . venlafaxine XR (EFFEXOR-XR) 75 MG 24 hr capsule Take 1 capsule (75 mg total) by mouth daily with breakfast. 90 capsule 3   No current facility-administered medications on file prior to visit.     Review of Systems  Constitutional: Negative for activity change, appetite change, fatigue, fever and unexpected weight change.  HENT: Negative for congestion, ear pain, rhinorrhea, sinus pressure and sore throat.   Eyes: Negative for pain, redness and visual disturbance.  Respiratory: Positive for cough. Negative for shortness of breath and wheezing.   Cardiovascular:  Negative for chest pain and palpitations.  Gastrointestinal: Negative for abdominal pain, blood in stool, constipation and diarrhea.  Endocrine: Negative for polydipsia and polyuria.  Genitourinary: Negative for dysuria, frequency and urgency.  Musculoskeletal: Negative for arthralgias, back pain and myalgias.  Skin: Negative for pallor and rash.  Allergic/Immunologic: Negative for environmental allergies.  Neurological: Negative for dizziness, syncope and headaches.  Hematological: Negative for adenopathy. Does not bruise/bleed easily.  Psychiatric/Behavioral: Negative for decreased concentration and dysphoric mood. The patient is not nervous/anxious.        Objective:   Physical Exam  Constitutional: She appears well-developed and well-nourished. No distress.  obese and well appearing   HENT:  Head: Normocephalic and atraumatic.  Mouth/Throat: Oropharynx is clear and moist.  Nares are boggy  Eyes: Pupils are equal, round, and reactive to light. Conjunctivae and EOM are normal.  Neck: Normal range of motion. Neck supple. No JVD present. Carotid bruit is not present. No thyromegaly present.  Cardiovascular: Normal rate, regular rhythm, normal heart sounds and intact distal pulses. Exam reveals no gallop.  Pulmonary/Chest: Effort normal and breath sounds normal. No respiratory distress. She has no wheezes. She has no rales.  No crackles  Good air exch  Abdominal: Soft. Bowel sounds are normal. She exhibits no distension, no abdominal bruit and no mass. There is no tenderness.  Musculoskeletal: She exhibits no edema.  Lymphadenopathy:    She has no cervical adenopathy.  Neurological: She is alert. She has normal reflexes. She displays normal reflexes. No cranial nerve deficit. Coordination normal.  Skin: Skin is warm and dry. No rash noted.  Psychiatric: She has a normal mood and affect.  Pleasant           Assessment & Plan:   Problem List Items Addressed This Visit       Cardiovascular and Mediastinum   Essential hypertension    bp in fair control at this time  BP Readings from Last 1 Encounters:  04/25/18 132/85   No changes needed Disc lifstyle change with low sodium diet and exercise  Labs rev         Endocrine   Controlled type 2 diabetes mellitus without complication, without long-term current use of insulin ([place]) - Primary    Lab Results  Component Value Date   HGBA1C 7.4 (H) 04/21/2018   Did diabetic teaching but still struggling since she is a picky eater  Enc to work on wt loss  Inc metformin to 1000 mg bid - alert if diarrhea is intolerable (could try xr)  F/u 3 mo Will discuss starting statin next time       Relevant Medications   metFORMIN (GLUCOPHAGE) 1000 MG tablet     Other   Obesity    Discussed how this problem influences overall health and the risks it imposes  Reviewed plan for weight loss with lower calorie diet (via better food choices and also portion control or program like weight watchers) and exercise building up to or more than 30 minutes 5 days per week including some aerobic activity         Relevant Medications   metFORMIN (GLUCOPHAGE) 1000 MG tablet

## 2018-04-27 NOTE — Assessment & Plan Note (Addendum)
Lab Results  Component Value Date   HGBA1C 7.4 (H) 04/21/2018   Did diabetic teaching but still struggling since she is a picky eater  Enc to work on wt loss  Inc metformin to 1000 mg bid - alert if diarrhea is intolerable (could try xr)  F/u 3 mo Will discuss starting statin next time

## 2018-04-27 NOTE — Assessment & Plan Note (Signed)
bp in fair control at this time  BP Readings from Last 1 Encounters:  04/25/18 132/85   No changes needed Disc lifstyle change with low sodium diet and exercise  Labs rev

## 2018-04-27 NOTE — Assessment & Plan Note (Signed)
Discussed how this problem influences overall health and the risks it imposes  Reviewed plan for weight loss with lower calorie diet (via better food choices and also portion control or program like weight watchers) and exercise building up to or more than 30 minutes 5 days per week including some aerobic activity    

## 2018-04-28 DIAGNOSIS — M1712 Unilateral primary osteoarthritis, left knee: Secondary | ICD-10-CM | POA: Diagnosis not present

## 2018-05-04 ENCOUNTER — Other Ambulatory Visit: Payer: Self-pay | Admitting: Primary Care

## 2018-05-04 DIAGNOSIS — J22 Unspecified acute lower respiratory infection: Secondary | ICD-10-CM

## 2018-05-07 ENCOUNTER — Telehealth: Payer: Self-pay | Admitting: Hematology and Oncology

## 2018-05-07 NOTE — Telephone Encounter (Signed)
Called pt, left message.  Rescheduled appt from 7/17 to 7/30 @ 9 am.  Mailed calendar.

## 2018-06-06 DIAGNOSIS — M1712 Unilateral primary osteoarthritis, left knee: Secondary | ICD-10-CM | POA: Diagnosis not present

## 2018-06-08 ENCOUNTER — Other Ambulatory Visit: Payer: Self-pay | Admitting: Family Medicine

## 2018-06-12 DIAGNOSIS — N3000 Acute cystitis without hematuria: Secondary | ICD-10-CM | POA: Diagnosis not present

## 2018-06-23 DIAGNOSIS — L400 Psoriasis vulgaris: Secondary | ICD-10-CM | POA: Diagnosis not present

## 2018-06-23 DIAGNOSIS — D225 Melanocytic nevi of trunk: Secondary | ICD-10-CM | POA: Diagnosis not present

## 2018-06-23 DIAGNOSIS — D2261 Melanocytic nevi of right upper limb, including shoulder: Secondary | ICD-10-CM | POA: Diagnosis not present

## 2018-06-30 ENCOUNTER — Other Ambulatory Visit: Payer: Self-pay | Admitting: Hematology and Oncology

## 2018-07-02 ENCOUNTER — Ambulatory Visit: Payer: 59 | Admitting: Hematology and Oncology

## 2018-07-15 ENCOUNTER — Inpatient Hospital Stay: Payer: 59 | Attending: Hematology and Oncology | Admitting: Hematology and Oncology

## 2018-07-15 ENCOUNTER — Telehealth: Payer: Self-pay | Admitting: Hematology and Oncology

## 2018-07-15 DIAGNOSIS — M8580 Other specified disorders of bone density and structure, unspecified site: Secondary | ICD-10-CM | POA: Insufficient documentation

## 2018-07-15 DIAGNOSIS — Z9011 Acquired absence of right breast and nipple: Secondary | ICD-10-CM | POA: Diagnosis not present

## 2018-07-15 DIAGNOSIS — E119 Type 2 diabetes mellitus without complications: Secondary | ICD-10-CM | POA: Diagnosis not present

## 2018-07-15 DIAGNOSIS — C50411 Malignant neoplasm of upper-outer quadrant of right female breast: Secondary | ICD-10-CM | POA: Insufficient documentation

## 2018-07-15 DIAGNOSIS — R232 Flushing: Secondary | ICD-10-CM | POA: Diagnosis not present

## 2018-07-15 DIAGNOSIS — Z79811 Long term (current) use of aromatase inhibitors: Secondary | ICD-10-CM | POA: Diagnosis not present

## 2018-07-15 DIAGNOSIS — Z7984 Long term (current) use of oral hypoglycemic drugs: Secondary | ICD-10-CM | POA: Diagnosis not present

## 2018-07-15 DIAGNOSIS — Z79899 Other long term (current) drug therapy: Secondary | ICD-10-CM | POA: Diagnosis not present

## 2018-07-15 DIAGNOSIS — Z170 Estrogen receptor positive status [ER+]: Secondary | ICD-10-CM | POA: Diagnosis not present

## 2018-07-15 DIAGNOSIS — Z7982 Long term (current) use of aspirin: Secondary | ICD-10-CM | POA: Insufficient documentation

## 2018-07-15 MED ORDER — ANASTROZOLE 1 MG PO TABS: ORAL_TABLET | ORAL | 3 refills | Status: DC

## 2018-07-15 NOTE — Assessment & Plan Note (Signed)
Right breast invasive ductal carcinoma 2.8 cm status post mastectomy grade 3 with high-grade DCIS, ALH and LCIS; separate tumor which was DCIS 1.5 cm, 1 SLN negative, ER 86%, PR 95%, HER-2 negative ratio 1.52, Ki-67 27% T2 N0 M0 stage II a, Oncotype Dx score 18 (11% ROR) Low risk; started tamoxifen 12/27/2014 Switched to anastrozole April 2016  Anastrozole Toxicities: 1. Severe hot flashes On Effexor.Markedly improved  2. Tingling and numbness when she lifts her arm above the head: Resolved Denies any hot flashes.  Bone density 01/02/2018: T score -2.3: Osteopenia I recommended bisphosphonate therapy  Breast Cancer Surveillance: 1. Breast exam 07/15/2018 normal 2. Mammogram on the left breast: benign. [facility]  RTC in 1 year for follow up.

## 2018-07-15 NOTE — Telephone Encounter (Signed)
Per 7/30 los.  Gave patient AVS.  Patient my chart active and did not need calendar.

## 2018-07-15 NOTE — Progress Notes (Signed)
Patient Care Team: Tower, Wynelle Fanny, MD as PCP - General Nicholas Lose, MD as Consulting Physician (Hematology and Oncology) Excell Seltzer, MD as Consulting Physician (General Surgery) Eppie Gibson, MD as Attending Physician (Radiation Oncology) Holley Bouche, NP as Nurse Practitioner (Nurse Practitioner)  DIAGNOSIS:  Encounter Diagnosis  Name Primary?  . Malignant neoplasm of upper-outer quadrant of right breast in female, estrogen receptor positive ([place])     SUMMARY OF ONCOLOGIC HISTORY:   Breast cancer of upper-outer quadrant of right female breast ([place])   10/20/2014 Initial Diagnosis    Right breast invasive and in situ mammary cancer grade 2 E-cadherin positive, invasive ductal carcinoma with DCIS focal atypical cells negative for atypia and ER 70% PR 80% HER-2 negative ratio 1.26 Ki-67 27%      10/22/2014 Breast MRI    Right breast upper outer quadrant 2.3 cm biopsy-proven malignancy      11/25/2014 Surgery    Right simple mastectomy: Tumor #1 upper lateral invasive high-grade ductal carcinoma 2.8 cm plus high-grade DCIS plus ALH and LCIS; tumor #2 upper medial DCIS 1.5 cm. Margins negative.      11/25/2014 Oncotype testing    18 (11% 10-year risk of distant recurrence on Tamoxifen alone).       01/03/2015 -  Anti-estrogen oral therapy    Tamoxifen  20 mg daily discontinued 03/28/2015 and switched to anastrozole 1 mg 03/23/15 daily when she was found to be postmenopausal. Plan treatment duration 5 years      03/24/2015 Surgery    Left breast mammoplasty: Benign       CHIEF COMPLIANT: Follow-up on anastrozole therapy  INTERVAL HISTORY: Diana Deleon is a 57 year old with above-mentioned history of right breast cancer currently on anastrozole therapy.  She appears to be tolerating it moderately well.  She does have hot flashes which are well controlled with Effexor.  She was diagnosed with diabetes and is trying to lose weight.  REVIEW OF SYSTEMS:     Constitutional: Denies fevers, chills or abnormal weight loss Eyes: Denies blurriness of vision Ears, nose, mouth, throat, and face: Denies mucositis or sore throat Respiratory: Denies cough, dyspnea or wheezes Cardiovascular: Denies palpitation, chest discomfort Gastrointestinal:  Denies nausea, heartburn or change in bowel habits Skin: Denies abnormal skin rashes Lymphatics: Denies new lymphadenopathy or easy bruising Neurological:Denies numbness, tingling or new weaknesses Behavioral/Psych: Mood is stable, no new changes  Extremities: No lower extremity edema Breast:  denies any pain or lumps or nodules in either breasts All other systems were reviewed with the patient and are negative.  I have reviewed the past medical history, past surgical history, social history and family history with the patient and they are unchanged from previous note.  ALLERGIES:  is allergic to norvasc [amlodipine besylate].  MEDICATIONS:  Current Outpatient Medications  Medication Sig Dispense Refill  . albuterol (PROVENTIL HFA;VENTOLIN HFA) 108 (90 Base) MCG/ACT inhaler Inhale 2 puffs into the lungs every 4 (four) hours as needed for wheezing or shortness of breath (cough). 1 Inhaler 0  . alendronate (FOSAMAX) 70 MG tablet Take 1 tablet (70 mg total) by mouth every 7 (seven) days. Take with a full glass of water on an empty stomach. 4 tablet 11  . anastrozole (ARIMIDEX) 1 MG tablet TAKE 1 TABLET(1 MG) BY MOUTH DAILY 90 tablet 0  . aspirin EC 81 MG tablet Take 81 mg by mouth daily.    [name] Kitchen augmented betamethasone dipropionate (DIPROLENE-AF) 0.05 % ointment Apply 1 application topically daily as needed.     [name] Kitchen  benzonatate (TESSALON) 200 MG capsule Take 1 capsule (200 mg total) by mouth 3 (three) times daily as needed for cough. 30 capsule 0  . Calcium Carb-Cholecalciferol (CALCIUM 600/VITAMIN D3) 600-800 MG-UNIT TABS Take 2 tablets by mouth daily.    . Fluocinolone Acetonide Body 0.01 % OIL Apply 1 application  topically daily as needed.     [name] Kitchen levothyroxine (SYNTHROID, LEVOTHROID) 50 MCG tablet Take 1 tablet (50 mcg total) by mouth daily. 30 tablet 11  . losartan-hydrochlorothiazide (HYZAAR) 100-25 MG tablet Take 1 tablet by mouth daily. 90 tablet 3  . meloxicam (MOBIC) 7.5 MG tablet Take 7.5 mg by mouth daily.    . metFORMIN (GLUCOPHAGE) 1000 MG tablet Take 1 tablet (1,000 mg total) by mouth 2 (two) times daily with a meal. 180 tablet 3  . methocarbamol (ROBAXIN) 500 MG tablet Take 1 tablet (500 mg total) by mouth 3 (three) times daily. 30 tablet 1  . omeprazole (PRILOSEC) 20 MG capsule TAKE 1 CAPSULE BY MOUTH DAILY BEFORE BREAKFAST 90 capsule 1  . ONETOUCH DELICA LANCETS FINE MISC Use to check blood sugar once daily and as needed (dx. E11.9) 100 each 1  . ONETOUCH VERIO test strip USE TO CHECK BLOOD SUGAR ONCE DAILY AND AS NEEDED 100 each 2  . valACYclovir (VALTREX) 500 MG tablet Take 1 tablet (500 mg total) by mouth 2 (two) times daily. For 5 days for an outbreak 10 tablet 3  . venlafaxine XR (EFFEXOR-XR) 75 MG 24 hr capsule TAKE 1 CAPSULE(75 MG) BY MOUTH DAILY WITH BREAKFAST 90 capsule 0   No current facility-administered medications for this visit.     PHYSICAL EXAMINATION: ECOG PERFORMANCE STATUS: 0 - Asymptomatic  Vitals:   07/15/18 0849  BP: (!) 162/90  Pulse: 85  Resp: 19  Temp: 98.4 F (36.9 C)  SpO2: 97%   Filed Weights   07/15/18 0849  Weight: 218 lb 3.2 oz (99 kg)    GENERAL:alert, no distress and comfortable SKIN: skin color, texture, turgor are normal, no rashes or significant lesions EYES: normal, Conjunctiva are pink and non-injected, sclera clear OROPHARYNX:no exudate, no erythema and lips, buccal mucosa, and tongue normal  NECK: supple, thyroid normal size, non-tender, without nodularity LYMPH:  no palpable lymphadenopathy in the cervical, axillary or inguinal LUNGS: clear to auscultation and percussion with normal breathing effort HEART: regular rate & rhythm and  no murmurs and no lower extremity edema ABDOMEN:abdomen soft, non-tender and normal bowel sounds MUSCULOSKELETAL:no cyanosis of digits and no clubbing  NEURO: alert & oriented x 3 with fluent speech, mild diabetic peripheral neuropathy EXTREMITIES: No lower extremity edema BREAST: No palpable masses or nodules in either right or left breasts. No palpable axillary supraclavicular or infraclavicular adenopathy no breast tenderness or nipple discharge. (exam performed in the presence of a chaperone)  LABORATORY DATA:  I have reviewed the data as listed CMP Latest Ref Rng & Units 04/21/2018 01/20/2018 11/22/2017  Glucose 70 - 99 mg/dL 181(H) 191(H) 183(H)  BUN 6 - 23 mg/dL '12 16 14  ' Creatinine 0.40 - 1.20 mg/dL 0.95 0.99 0.92  Sodium 135 - 145 mEq/L 139 138 140  Potassium 3.5 - 5.1 mEq/L 4.3 3.6 3.9  Chloride 96 - 112 mEq/L 98 97 100  CO2 19 - 32 mEq/L 34(H) 33(H) 34(H)  Calcium 8.4 - 10.5 mg/dL 9.3 9.2 9.2  Total Protein 6.0 - 8.3 g/dL 7.1 - 7.0  Total Bilirubin 0.2 - 1.2 mg/dL 0.6 - 0.9  Alkaline Phos 39 - 117 U/L 106 -  115  AST 0 - 37 U/L 20 - 23  ALT 0 - 35 U/L 29 - 30    Lab Results  Component Value Date   WBC 5.4 11/22/2017   HGB 14.6 11/22/2017   HCT 42.5 11/22/2017   MCV 90.1 11/22/2017   PLT 240.0 11/22/2017   NEUTROABS 3.3 11/22/2017    ASSESSMENT & PLAN:  Breast cancer of upper-outer quadrant of right female breast Right breast invasive ductal carcinoma 2.8 cm status post mastectomy grade 3 with high-grade DCIS, ALH and LCIS; separate tumor which was DCIS 1.5 cm, 1 SLN negative, ER 86%, PR 95%, HER-2 negative ratio 1.52, Ki-67 27% T2 N0 M0 stage II a, Oncotype Dx score 18 (11% ROR) Low risk; started tamoxifen 12/27/2014 Switched to anastrozole April 2016  Anastrozole Toxicities: 1. Severe hot flashes On Effexor.Markedly improved  2. Tingling and numbness when she lifts her arm above the head: Resolved Denies any hot flashes.  Bone density 01/02/2018: T score -2.3:  Osteopenia I recommended bisphosphonate therapy Patient has a prescription for Fosamax given by her primary care physician.  She will now start taking it.  Breast Cancer Surveillance: 1. Breast exam 07/15/2018 normal 2. Mammogram on the left breast January 2019 : benign. [facility]  RTC in 1 year for follow up.   No orders of the defined types were placed in this encounter.  The patient has a good understanding of the overall plan. she agrees with it. she will call with any problems that may develop before the next visit here.   Harriette Ohara, MD 07/15/18

## 2018-07-31 ENCOUNTER — Encounter: Payer: Self-pay | Admitting: Family Medicine

## 2018-07-31 ENCOUNTER — Ambulatory Visit: Payer: 59 | Admitting: Family Medicine

## 2018-07-31 VITALS — BP 143/90 | HR 81 | Temp 97.8°F | Ht 64.0 in | Wt 221.0 lb

## 2018-07-31 DIAGNOSIS — E119 Type 2 diabetes mellitus without complications: Secondary | ICD-10-CM | POA: Diagnosis not present

## 2018-07-31 DIAGNOSIS — I10 Essential (primary) hypertension: Secondary | ICD-10-CM | POA: Diagnosis not present

## 2018-07-31 DIAGNOSIS — Z6837 Body mass index (BMI) 37.0-37.9, adult: Secondary | ICD-10-CM | POA: Diagnosis not present

## 2018-07-31 LAB — POCT GLYCOSYLATED HEMOGLOBIN (HGB A1C): HEMOGLOBIN A1C: 7 % — AB (ref 4.0–5.6)

## 2018-07-31 MED ORDER — METOPROLOL SUCCINATE ER 50 MG PO TB24: 50.0000 mg | ORAL_TABLET | Freq: Every day | ORAL | 11 refills | Status: DC

## 2018-07-31 MED ORDER — METFORMIN HCL ER 500 MG PO TB24: 1000.0000 mg | ORAL_TABLET | Freq: Every day | ORAL | 11 refills | Status: DC

## 2018-07-31 NOTE — Assessment & Plan Note (Signed)
Discussed how this problem influences overall health and the risks it imposes  Reviewed plan for weight loss with lower calorie diet (via better food choices and also portion control or program like weight watchers) and exercise building up to or more than 30 minutes 5 days per week including some aerobic activity    

## 2018-07-31 NOTE — Patient Instructions (Addendum)
For cholesterol - avoid beef and fatty pork   Try to get most of your carbohydrates from produce (with the exception of white potatoes)  Eat less bread/pasta/rice/snack foods/cereals/sweets and other items from the middle of the grocery store (processed carbs)   I think a recumbent exercise bike is a good idea  Also videos on TV or DVD or stream- chair exercise/chair yoga are also excellent  Work up to 30 or more minutes per day 5 or more days per week    Change metformin to the metformin XR 2 pills once daily (1000)  We can increase to 3 pills depending on how you do  Let me know in 2 weeks (call)- to update re: how are blood sugar readings and how is GI situation   Am fasting - and then 2 hours after a meal   Also start metoprolol xl 50 mg once daily for blood pressure -if any problems or side effects let me know   Follow up in 3 months

## 2018-07-31 NOTE — Progress Notes (Signed)
Subjective:    Patient ID: Diana Deleon, female    DOB: 1961/07/13, 57 y.o.   MRN: [id number]  HPI Here for f/u of chronic health problems   Lots of stress - mom was diag with Parkinsons  Lot of appts and therapy    Wt Readings from Last 3 Encounters:  07/31/18 221 lb (100.2 kg)  07/15/18 218 lb 3.2 oz (99 kg)  04/25/18 221 lb 4 oz (100.4 kg)  overall stable  37.93 kg/m   Not much exercise  She does not have time  Knee problems    bp is up today on first check  No cp or palpitations or headaches or edema  No side effects to medicines  BP Readings from Last 3 Encounters:  07/31/18 (!) 154/92  07/15/18 (!) 162/90  04/25/18 132/85  re check BP: (!) 143/90   losartan hct 100-25 No side effects No ankle edema       Pulse Readings from Last 3 Encounters:  07/31/18 81  07/15/18 85  04/25/18 90   DM2 Lab Results  Component Value Date   HGBA1C 7.4 (H) 04/21/2018   Today mildly improved   Lab Results  Component Value Date   HGBA1C 7.0 (A) 07/31/2018    Eye exam  ARB for renal protection  Last visit- struggling as a picky eater  Inc her metformin to 1000 mg bid - is having diarrhea -so she did not take it bid every day  Thought it may go away  Glucose was 214 this am  Generally 175 to low 200s  Eats the same things (less salads due to diarrhea)  Getting better about eating breakfast   Salad is something she likes/ carrots/celery/onions /cucumbers / tomato in salad Some fruits -pretty varied  Meat eater- for protein  No sandwiches  No snack foods  Sweets - really likes-does not like to cook   Her oncologist enc her to start taking the OP med - fosamax      Lab Results  Component Value Date   CHOL 171 04/21/2018   HDL 60.70 04/21/2018   LDLCALC 92 04/21/2018   TRIG 89.0 04/21/2018   CHOLHDL 3 04/21/2018    Patient Active Problem List   Diagnosis Date Noted  . Low back pain 03/21/2018  . Osteopenia 01/05/2018  . Aromatase inhibitor use  12/25/2017  . Psoriasis 11/02/2016  . Colon cancer screening 11/02/2016  . Obesity 10/31/2015  . Breast cancer of upper-outer quadrant of right female breast ([place]) 10/25/2014  . Controlled type 2 diabetes mellitus without complication, without long-term current use of insulin ([place]) 07/14/2013  . Routine general medical examination at a health care facility 10/12/2012  . Hypothyroidism 06/03/2012  . Overactive bladder 05/27/2012  . HSV infection 07/07/2010  . VERTIGO 08/01/2009  . URINARY URGENCY 07/15/2009  . LOW BACK PAIN, CHRONIC 05/17/2008  . HEADACHE, CHRONIC 05/17/2008  . Essential hypertension 02/12/2008   Past Medical History:  Diagnosis Date  . Anemia    "off and on"  . Arthritis    "right toes" (03/24/2015)  . Cancer of right breast ([place]) 2015  . Cyst of cystic duct    chest- central location, taking Cephalexin currently  . Diabetes mellitus without complication ([place])   . Fibroids    hyst. 2006  . GERD (gastroesophageal reflux disease)   . History of abnormal Pap smear    has had leep in past  . History of cold sores    has used valtrex in past  .  History of recurrent UTIs   . Hypertension   . Hypothyroidism   . Intermittent vertigo   . Kidney stones   . Seborrheic dermatitis    local on R scalp and in ear (failed mult meds- uses steroid spray)  . Thyroid disease    Past Surgical History:  Procedure Laterality Date  . BREAST RECONSTRUCTION WITH PLACEMENT OF TISSUE EXPANDER AND FLEX HD (ACELLULAR HYDRATED DERMIS) Right 11/25/2014   Procedure: RIGHT BREAST RECONSTRUCTION WITH PLACEMENT OF TISSUE EXPANDER AND USE OF ACELLULAR DERMRAL MATRIX ;  Surgeon: Crissie Reese, MD;  Location: [place];  Service: Plastics;  Laterality: Right;  . BREAST REDUCTION SURGERY Left 03/24/2015   Procedure: LEFT BREAST REDUCTION  (BREAST);  Surgeon: Crissie Reese, MD;  Location: [place];  Service: Plastics;  Laterality: Left;  . COLONOSCOPY    . COLONOSCOPY WITH PROPOFOL N/A 12/28/2016    Procedure: COLONOSCOPY WITH PROPOFOL;  Surgeon: Jonathon Bellows, MD;  Location: [facility];  Service: Endoscopy;  Laterality: N/A;  . LAPAROSCOPIC CHOLECYSTECTOMY  2003  . LITHOTRIPSY  ~ 2007  . REMOVAL OF TISSUE EXPANDER Right 03/24/2015  . REMOVAL OF TISSUE EXPANDER AND PLACEMENT OF IMPLANT Right 03/24/2015   w/delayed Architect  . REMOVAL OF TISSUE EXPANDER AND PLACEMENT OF IMPLANT Right 03/24/2015   Procedure: REMOVAL OF RIGHT BREAST TISSUE EXPANDER AND DELAYED BREAST RECONSTRUCTION WITH PLACEMENT OF IMPLANT;  Surgeon: Crissie Reese, MD;  Location: [place];  Service: Plastics;  Laterality: Right;  . SIMPLE MASTECTOMY WITH AXILLARY SENTINEL NODE BIOPSY Right 11/25/2014   Procedure: RIGHT TOTAL  MASTECTOMY WITH AXILLARY SENTINEL NODE BIOPSY ;  Surgeon: Excell Seltzer, MD;  Location: [place];  Service: General;  Laterality: Right;  . TUBAL LIGATION    . VAGINAL DELIVERY  x3  . VAGINAL HYSTERECTOMY  2006   "fibroids"   Social History   Tobacco Use  . Smoking status: Never Smoker  . Smokeless tobacco: Never Used  Substance Use Topics  . Alcohol use: No    Alcohol/week: 0.0 standard drinks  . Drug use: No   Family History  Problem Relation Age of Onset  . Alcohol abuse Father   . COPD Father   . Hypertension Father   . Hypertension Mother    Allergies  Allergen Reactions  . Norvasc [Amlodipine Besylate] Swelling   Current Outpatient Medications on File Prior to Visit  Medication Sig Dispense Refill  . albuterol (PROVENTIL HFA;VENTOLIN HFA) 108 (90 Base) MCG/ACT inhaler Inhale 2 puffs into the lungs every 4 (four) hours as needed for wheezing or shortness of breath (cough). 1 Inhaler 0  . alendronate (FOSAMAX) 70 MG tablet Take 1 tablet (70 mg total) by mouth every 7 (seven) days. Take with a full glass of water on an empty stomach. 4 tablet 11  . anastrozole (ARIMIDEX) 1 MG tablet TAKE 1 TABLET(1 MG) BY MOUTH DAILY 90 tablet 3  . aspirin EC 81 MG tablet Take 81 mg by mouth daily.      [name] Kitchen augmented betamethasone dipropionate (DIPROLENE-AF) 0.05 % ointment Apply 1 application topically daily as needed.     . benzonatate (TESSALON) 200 MG capsule Take 1 capsule (200 mg total) by mouth 3 (three) times daily as needed for cough. 30 capsule 0  . Calcium Carb-Cholecalciferol (CALCIUM 600/VITAMIN D3) 600-800 MG-UNIT TABS Take 2 tablets by mouth daily.    [name] Kitchen levothyroxine (SYNTHROID, LEVOTHROID) 50 MCG tablet Take 1 tablet (50 mcg total) by mouth daily. 30 tablet 11  . losartan-hydrochlorothiazide (HYZAAR) 100-25 MG tablet  Take 1 tablet by mouth daily. 90 tablet 3  . meloxicam (MOBIC) 7.5 MG tablet Take 7.5 mg by mouth daily.    . methocarbamol (ROBAXIN) 500 MG tablet Take 1 tablet (500 mg total) by mouth 3 (three) times daily. 30 tablet 1  . omeprazole (PRILOSEC) 20 MG capsule TAKE 1 CAPSULE BY MOUTH DAILY BEFORE BREAKFAST 90 capsule 1  . ONETOUCH DELICA LANCETS FINE MISC Use to check blood sugar once daily and as needed (dx. E11.9) 100 each 1  . ONETOUCH VERIO test strip USE TO CHECK BLOOD SUGAR ONCE DAILY AND AS NEEDED 100 each 2  . valACYclovir (VALTREX) 500 MG tablet Take 1 tablet (500 mg total) by mouth 2 (two) times daily. For 5 days for an outbreak 10 tablet 3  . venlafaxine XR (EFFEXOR-XR) 75 MG 24 hr capsule TAKE 1 CAPSULE(75 MG) BY MOUTH DAILY WITH BREAKFAST 90 capsule 0   No current facility-administered medications on file prior to visit.     Review of Systems  Constitutional: Negative for activity change, appetite change, fatigue, fever and unexpected weight change.  HENT: Negative for congestion, ear pain, rhinorrhea, sinus pressure and sore throat.   Eyes: Negative for pain, redness and visual disturbance.  Respiratory: Negative for cough, shortness of breath and wheezing.   Cardiovascular: Negative for chest pain and palpitations.  Gastrointestinal: Negative for abdominal pain, blood in stool, constipation and diarrhea.  Endocrine: Negative for polydipsia and  polyuria.  Genitourinary: Negative for dysuria, frequency and urgency.  Musculoskeletal: Negative for arthralgias, back pain and myalgias.  Skin: Negative for pallor and rash.  Allergic/Immunologic: Negative for environmental allergies.  Neurological: Negative for dizziness, syncope and headaches.  Hematological: Negative for adenopathy. Does not bruise/bleed easily.  Psychiatric/Behavioral: Negative for decreased concentration and dysphoric mood. The patient is not nervous/anxious.        Stressors        Objective:   Physical Exam  Constitutional: She appears well-developed and well-nourished. No distress.  obese and well appearing   HENT:  Head: Normocephalic and atraumatic.  Mouth/Throat: Oropharynx is clear and moist.  Eyes: Pupils are equal, round, and reactive to light. Conjunctivae and EOM are normal.  Neck: Normal range of motion. Neck supple. No JVD present. Carotid bruit is not present. No thyromegaly present.  Cardiovascular: Normal rate, regular rhythm, normal heart sounds and intact distal pulses. Exam reveals no gallop.  Pulmonary/Chest: Effort normal and breath sounds normal. No respiratory distress. She has no wheezes. She has no rales.  No crackles  Abdominal: Soft. Bowel sounds are normal. She exhibits no distension, no abdominal bruit and no mass. There is no tenderness.  Musculoskeletal: She exhibits no edema.  Lymphadenopathy:    She has no cervical adenopathy.  Neurological: She is alert. She has normal reflexes. She displays normal reflexes. Coordination normal.  Skin: Skin is warm and dry. No rash noted.  Psychiatric: She has a normal mood and affect.  Seems fatigued and stressed           Assessment & Plan:   Problem List Items Addressed This Visit      Cardiovascular and Mediastinum   Essential hypertension    BP: (!) 143/90    Not at goal Adding metoprolol xl 50 mg qd  Disc poss side eff incl hypotension and slow pulse, parameters discussed   F/u 3 mo  DASH diet  Exercise as tolerated      Relevant Medications   metoprolol succinate (TOPROL-XL) 50 MG 24 hr tablet  Endocrine   Controlled type 2 diabetes mellitus without complication, without long-term current use of insulin (HCC) - Primary    Lab Results  Component Value Date   HGBA1C 7.0 (A) 07/31/2018   Small improvement  Having diarrhea with metformin so not taking compliantly  Will change to metformin xr 500 mg   2 po once daily  inst to alert [place] in 1-2 weeks re: glucose readings and whether diarrhea improves  DM diet disc Plan for exercise made Eye and foot care reviewed  F/u 3 mo      Relevant Medications   metFORMIN (GLUCOPHAGE-XR) 500 MG 24 hr tablet   Other Relevant Orders   HgB A1c (Completed)     Other   Obesity    Discussed how this problem influences overall health and the risks it imposes  Reviewed plan for weight loss with lower calorie diet (via better food choices and also portion control or program like weight watchers) and exercise building up to or more than 30 minutes 5 days per week including some aerobic activity         Relevant Medications   metFORMIN (GLUCOPHAGE-XR) 500 MG 24 hr tablet

## 2018-07-31 NOTE — Assessment & Plan Note (Signed)
BP: (!) 143/90    Not at goal Adding metoprolol xl 50 mg qd  Disc poss side eff incl hypotension and slow pulse, parameters discussed  F/u 3 mo  DASH diet  Exercise as tolerated

## 2018-07-31 NOTE — Assessment & Plan Note (Signed)
Lab Results  Component Value Date   HGBA1C 7.0 (A) 07/31/2018   Small improvement  Having diarrhea with metformin so not taking compliantly  Will change to metformin xr 500 mg   2 po once daily  inst to alert [place] in 1-2 weeks re: glucose readings and whether diarrhea improves  DM diet disc Plan for exercise made Eye and foot care reviewed  F/u 3 mo

## 2018-09-09 ENCOUNTER — Other Ambulatory Visit: Payer: Self-pay | Admitting: Hematology and Oncology

## 2018-10-18 ENCOUNTER — Other Ambulatory Visit: Payer: Self-pay | Admitting: Family Medicine

## 2018-10-20 ENCOUNTER — Other Ambulatory Visit (INDEPENDENT_AMBULATORY_CARE_PROVIDER_SITE_OTHER): Payer: 59

## 2018-10-20 DIAGNOSIS — E119 Type 2 diabetes mellitus without complications: Secondary | ICD-10-CM

## 2018-10-20 DIAGNOSIS — I10 Essential (primary) hypertension: Secondary | ICD-10-CM | POA: Diagnosis not present

## 2018-10-20 LAB — LIPID PANEL
CHOLESTEROL: 177 mg/dL (ref 0–200)
HDL: 52.7 mg/dL (ref >39.00)
LDL CALC: 96 mg/dL (ref 0–99)
NonHDL: 124.74
Total CHOL/HDL Ratio: 3
Triglycerides: 142 mg/dL (ref 0.0–149.0)
VLDL: 28.4 mg/dL (ref 0.0–40.0)

## 2018-10-20 LAB — COMPREHENSIVE METABOLIC PANEL
ALT: 23 U/L (ref 0–35)
AST: 16 U/L (ref 0–37)
Albumin: 4.1 g/dL (ref 3.5–5.2)
Alkaline Phosphatase: 97 U/L (ref 39–117)
BUN: 16 mg/dL (ref 6–23)
CHLORIDE: 97 meq/L (ref 96–112)
CO2: 36 mEq/L — ABNORMAL HIGH (ref 19–32)
Calcium: 9.8 mg/dL (ref 8.4–10.5)
Creatinine, Ser: 1.13 mg/dL (ref 0.40–1.20)
GFR: 52.96 mL/min — AB (ref >60.00)
GLUCOSE: 240 mg/dL — AB (ref 70–99)
POTASSIUM: 3.7 meq/L (ref 3.5–5.1)
SODIUM: 141 meq/L (ref 135–145)
Total Bilirubin: 0.6 mg/dL (ref 0.2–1.2)
Total Protein: 7 g/dL (ref 6.0–8.3)

## 2018-10-20 LAB — HEMOGLOBIN A1C: HEMOGLOBIN A1C: 7.6 % — AB (ref 4.6–6.5)

## 2018-10-31 ENCOUNTER — Encounter: Payer: Self-pay | Admitting: Family Medicine

## 2018-10-31 ENCOUNTER — Ambulatory Visit: Payer: 59 | Admitting: Family Medicine

## 2018-10-31 VITALS — BP 124/82 | HR 78 | Temp 98.8°F | Ht 64.0 in | Wt 215.5 lb

## 2018-10-31 DIAGNOSIS — E1169 Type 2 diabetes mellitus with other specified complication: Secondary | ICD-10-CM

## 2018-10-31 DIAGNOSIS — Z6836 Body mass index (BMI) 36.0-36.9, adult: Secondary | ICD-10-CM

## 2018-10-31 DIAGNOSIS — Z23 Encounter for immunization: Secondary | ICD-10-CM | POA: Diagnosis not present

## 2018-10-31 DIAGNOSIS — I10 Essential (primary) hypertension: Secondary | ICD-10-CM | POA: Diagnosis not present

## 2018-10-31 DIAGNOSIS — E119 Type 2 diabetes mellitus without complications: Secondary | ICD-10-CM | POA: Diagnosis not present

## 2018-10-31 DIAGNOSIS — E785 Hyperlipidemia, unspecified: Secondary | ICD-10-CM

## 2018-10-31 MED ORDER — GLIPIZIDE ER 5 MG PO TB24: 5.0000 mg | ORAL_TABLET | Freq: Every day | ORAL | 11 refills | Status: DC

## 2018-10-31 NOTE — Assessment & Plan Note (Signed)
Discussed how this problem influences overall health and the risks it imposes  Reviewed plan for weight loss with lower calorie diet (via better food choices and also portion control or program like weight watchers) and exercise building up to or more than 30 minutes 5 days per week including some aerobic activity   Wt down 5 lb- commended on exercise

## 2018-10-31 NOTE — Patient Instructions (Addendum)
Start glipizide xl 5 mg daily in the am If side effects or problems or low glucose readings hold it and call   Continue working on diet/ exercise and weight loss   Flu shot today   Follow up in 3 months for health mt

## 2018-10-31 NOTE — Progress Notes (Signed)
Subjective:    Patient ID: Diana Deleon, female    DOB: 22-Sep-1961, 57 y.o.   MRN: [id number]  HPI Here for 3 mo f/u of chronic health problems   Wt Readings from Last 3 Encounters:  10/31/18 215 lb 8 oz (97.8 kg)  07/31/18 221 lb (100.2 kg)  07/15/18 218 lb 3.2 oz (99 kg)  down 6 lb  36.99 kg/m   HTN Added metoprolol xl 50 mg last visit bp is improved  No cp or palpitations or headaches or edema  No side effects to medicines  BP Readings from Last 3 Encounters:  10/31/18 124/82  07/31/18 (!) 143/90  07/15/18 (!) 162/90    Pulse Readings from Last 3 Encounters:  10/31/18 78  07/31/18 81  07/15/18 85    Diabetes Home sugar results  DM diet - improved (a very picky eater)  Exercise - got a recumbent bike and Diana Deleon likes it  Symptoms A1C last  Lab Results  Component Value Date   HGBA1C 7.6 (H) 10/20/2018  up from 7.0   Last visit changed short acting metformin to xr 1000 qd -did not tolerate (still had significant diarrhea)  Renal protection arb Last eye exam  1/19  Flu shot today   Cholesterol  Lab Results  Component Value Date   CHOL 177 10/20/2018   CHOL 171 04/21/2018   CHOL 148 11/22/2017   Lab Results  Component Value Date   HDL 52.70 10/20/2018   HDL 60.70 04/21/2018   HDL 58.80 11/22/2017   Lab Results  Component Value Date   LDLCALC 96 10/20/2018   LDLCALC 92 04/21/2018   LDLCALC 77 11/22/2017   Lab Results  Component Value Date   TRIG 142.0 10/20/2018   TRIG 89.0 04/21/2018   TRIG 60.0 11/22/2017   Lab Results  Component Value Date   CHOLHDL 3 10/20/2018   CHOLHDL 3 04/21/2018   CHOLHDL 3 11/22/2017   No results found for: LDLDIRECT Diet controlled  Not on statin  Patient Active Problem List   Diagnosis Date Noted  . Low back pain 03/21/2018  . Osteopenia 01/05/2018  . Aromatase inhibitor use 12/25/2017  . Psoriasis 11/02/2016  . Colon cancer screening 11/02/2016  . Obesity 10/31/2015  . Breast cancer of upper-outer  quadrant of right female breast ([place]) 10/25/2014  . Controlled type 2 diabetes mellitus without complication, without long-term current use of insulin ([place]) 07/14/2013  . Routine general medical examination at a health care facility 10/12/2012  . Hypothyroidism 06/03/2012  . Overactive bladder 05/27/2012  . HSV infection 07/07/2010  . VERTIGO 08/01/2009  . URINARY URGENCY 07/15/2009  . LOW BACK PAIN, CHRONIC 05/17/2008  . HEADACHE, CHRONIC 05/17/2008  . Essential hypertension 02/12/2008   Past Medical History:  Diagnosis Date  . Anemia    "off and on"  . Arthritis    "right toes" (03/24/2015)  . Cancer of right breast ([place]) 2015  . Cyst of cystic duct    chest- central location, taking Cephalexin currently  . Diabetes mellitus without complication ([place])   . Fibroids    hyst. 2006  . GERD (gastroesophageal reflux disease)   . History of abnormal Pap smear    has had leep in past  . History of cold sores    has used valtrex in past  . History of recurrent UTIs   . Hypertension   . Hypothyroidism   . Intermittent vertigo   . Kidney stones   . Seborrheic dermatitis    local  on R scalp and in ear (failed mult meds- uses steroid spray)  . Thyroid disease    Past Surgical History:  Procedure Laterality Date  . BREAST RECONSTRUCTION WITH PLACEMENT OF TISSUE EXPANDER AND FLEX HD (ACELLULAR HYDRATED DERMIS) Right 11/25/2014   Procedure: RIGHT BREAST RECONSTRUCTION WITH PLACEMENT OF TISSUE EXPANDER AND USE OF ACELLULAR DERMRAL MATRIX ;  Surgeon: Crissie Reese, MD;  Location: [place];  Service: Plastics;  Laterality: Right;  . BREAST REDUCTION SURGERY Left 03/24/2015   Procedure: LEFT BREAST REDUCTION  (BREAST);  Surgeon: Crissie Reese, MD;  Location: [facility];  Service: Plastics;  Laterality: Left;  . COLONOSCOPY    . COLONOSCOPY WITH PROPOFOL N/A 12/28/2016   Procedure: COLONOSCOPY WITH PROPOFOL;  Surgeon: Jonathon Bellows, MD;  Location: [facility];  Service: Endoscopy;  Laterality: N/A;  .  LAPAROSCOPIC CHOLECYSTECTOMY  2003  . LITHOTRIPSY  ~ 2007  . REMOVAL OF TISSUE EXPANDER Right 03/24/2015  . REMOVAL OF TISSUE EXPANDER AND PLACEMENT OF IMPLANT Right 03/24/2015   w/delayed Architect  . REMOVAL OF TISSUE EXPANDER AND PLACEMENT OF IMPLANT Right 03/24/2015   Procedure: REMOVAL OF RIGHT BREAST TISSUE EXPANDER AND DELAYED BREAST RECONSTRUCTION WITH PLACEMENT OF IMPLANT;  Surgeon: Crissie Reese, MD;  Location: [place];  Service: Plastics;  Laterality: Right;  . SIMPLE MASTECTOMY WITH AXILLARY SENTINEL NODE BIOPSY Right 11/25/2014   Procedure: RIGHT TOTAL  MASTECTOMY WITH AXILLARY SENTINEL NODE BIOPSY ;  Surgeon: Excell Seltzer, MD;  Location: [place];  Service: General;  Laterality: Right;  . TUBAL LIGATION    . VAGINAL DELIVERY  x3  . VAGINAL HYSTERECTOMY  2006   "fibroids"   Social History   Tobacco Use  . Smoking status: Never Smoker  . Smokeless tobacco: Never Used  Substance Use Topics  . Alcohol use: No    Alcohol/week: 0.0 standard drinks  . Drug use: No   Family History  Problem Relation Age of Onset  . Alcohol abuse Father   . COPD Father   . Hypertension Father   . Hypertension Mother   . Parkinson's disease Mother    Allergies  Allergen Reactions  . Metformin And Related     Diarrhea (even XR)   . Norvasc [Amlodipine Besylate] Swelling   Current Outpatient Medications on File Prior to Visit  Medication Sig Dispense Refill  . alendronate (FOSAMAX) 70 MG tablet Take 1 tablet (70 mg total) by mouth every 7 (seven) days. Take with a full glass of water on an empty stomach. 4 tablet 11  . anastrozole (ARIMIDEX) 1 MG tablet TAKE 1 TABLET(1 MG) BY MOUTH DAILY 90 tablet 3  . aspirin EC 81 MG tablet Take 81 mg by mouth daily.    [name] Kitchen augmented betamethasone dipropionate (DIPROLENE-AF) 0.05 % ointment Apply 1 application topically daily as needed.     . Calcium Carb-Cholecalciferol (CALCIUM 600/VITAMIN D3) 600-800 MG-UNIT TABS Take 2 tablets by mouth daily.    [name] Kitchen  levothyroxine (SYNTHROID, LEVOTHROID) 50 MCG tablet Take 1 tablet (50 mcg total) by mouth daily. 30 tablet 11  . losartan-hydrochlorothiazide (HYZAAR) 100-25 MG tablet Take 1 tablet by mouth daily. 90 tablet 3  . meloxicam (MOBIC) 7.5 MG tablet Take 7.5 mg by mouth daily.    . metoprolol succinate (TOPROL-XL) 50 MG 24 hr tablet Take 1 tablet (50 mg total) by mouth daily. Take with or immediately following a meal. 30 tablet 11  . omeprazole (PRILOSEC) 20 MG capsule TAKE 1 CAPSULE BY MOUTH DAILY BEFORE BREAKFAST 90 capsule 1  .  ONETOUCH DELICA LANCETS FINE MISC Use to check blood sugar once daily and as needed (dx. E11.9) 100 each 1  . ONETOUCH VERIO test strip USE TO CHECK BLOOD SUGAR ONCE DAILY AND AS NEEDED 100 each 2  . valACYclovir (VALTREX) 500 MG tablet Take 1 tablet (500 mg total) by mouth 2 (two) times daily. For 5 days for an outbreak 10 tablet 3  . venlafaxine XR (EFFEXOR-XR) 75 MG 24 hr capsule TAKE 1 CAPSULE(75 MG) BY MOUTH DAILY WITH BREAKFAST 90 capsule 0   No current facility-administered medications on file prior to visit.      Review of Systems  Constitutional: Positive for fatigue. Negative for activity change, appetite change, fever and unexpected weight change.  HENT: Negative for congestion, ear pain, rhinorrhea, sinus pressure and sore throat.   Eyes: Negative for pain, redness and visual disturbance.  Respiratory: Negative for cough, shortness of breath and wheezing.   Cardiovascular: Negative for chest pain and palpitations.  Gastrointestinal: Negative for abdominal pain, blood in stool, constipation and diarrhea.       Diarrhea improved off of metformin   Endocrine: Negative for polydipsia and polyuria.  Genitourinary: Negative for dysuria, frequency and urgency.  Musculoskeletal: Negative for arthralgias, back pain and myalgias.  Skin: Negative for pallor and rash.  Allergic/Immunologic: Negative for environmental allergies.  Neurological: Negative for dizziness,  syncope and headaches.  Hematological: Negative for adenopathy. Does not bruise/bleed easily.  Psychiatric/Behavioral: Negative for decreased concentration and dysphoric mood. The patient is not nervous/anxious.        Objective:   Physical Exam  Constitutional: Diana Deleon appears well-developed and well-nourished. No distress.  obese and well appearing   HENT:  Head: Normocephalic and atraumatic.  Mouth/Throat: Oropharynx is clear and moist.  Eyes: Pupils are equal, round, and reactive to light. Conjunctivae and EOM are normal.  Neck: Normal range of motion. Neck supple. No JVD present. Carotid bruit is not present. No thyromegaly present.  Cardiovascular: Normal rate, regular rhythm, normal heart sounds and intact distal pulses. Exam reveals no gallop.  Pulmonary/Chest: Effort normal and breath sounds normal. No respiratory distress. Diana Deleon has no wheezes. Diana Deleon has no rales.  No crackles  Abdominal: Soft. Bowel sounds are normal. Diana Deleon exhibits no distension, no abdominal bruit and no mass. There is no tenderness.  Musculoskeletal: Diana Deleon exhibits no edema.  Lymphadenopathy:    Diana Deleon has no cervical adenopathy.  Neurological: Diana Deleon is alert. Diana Deleon has normal reflexes. Diana Deleon displays normal reflexes. No cranial nerve deficit. Coordination normal.  Skin: Skin is warm and dry. No rash noted.  Psychiatric: Diana Deleon has a normal mood and affect.  Pleasant           Assessment & Plan:   Problem List Items Addressed This Visit      Cardiovascular and Mediastinum   Essential hypertension    Much improved with addition of metoprolol  bp in fair control at this time  BP Readings from Last 1 Encounters:  10/31/18 124/82   No changes needed Most recent labs reviewed  Disc lifstyle change with low sodium diet and exercise   Continue losartan hct         Endocrine   Controlled type 2 diabetes mellitus without complication, without long-term current use of insulin ([place]) - Primary    Lab Results  Component  Value Date   HGBA1C 7.6 (H) 10/20/2018   Did not tolerate metformin xr -not taking anything High glucose in ams Will start glipizide xl 5 mg daily (warned of  side effects like low glucose)  DM diet  Foot/eye care disc F/u 3 mo       Relevant Medications   glipiZIDE (GLUCOTROL XL) 5 MG 24 hr tablet   Hyperlipidemia associated with type 2 diabetes mellitus (HCC)    LDL of 96 Diet control Disc goals for lipids and reasons to control them Rev last labs with pt Rev low sat fat diet in detail Disc eventual need for a statin - will disc at next visit after we get glucose in better control      Relevant Medications   glipiZIDE (GLUCOTROL XL) 5 MG 24 hr tablet     Other   Obesity    Discussed how this problem influences overall health and the risks it imposes  Reviewed plan for weight loss with lower calorie diet (via better food choices and also portion control or program like weight watchers) and exercise building up to or more than 30 minutes 5 days per week including some aerobic activity   Wt down 5 lb- commended on exercise       Relevant Medications   glipiZIDE (GLUCOTROL XL) 5 MG 24 hr tablet    Other Visit Diagnoses    Need for influenza vaccination       Relevant Orders   Flu Vaccine QUAD 6+ mos PF IM (Fluarix Quad PF) (Completed)

## 2018-10-31 NOTE — Assessment & Plan Note (Signed)
Lab Results  Component Value Date   HGBA1C 7.6 (H) 10/20/2018   Did not tolerate metformin xr -not taking anything High glucose in ams Will start glipizide xl 5 mg daily (warned of side effects like low glucose)  DM diet  Foot/eye care disc F/u 3 mo

## 2018-10-31 NOTE — Assessment & Plan Note (Signed)
Much improved with addition of metoprolol  bp in fair control at this time  BP Readings from Last 1 Encounters:  10/31/18 124/82   No changes needed Most recent labs reviewed  Disc lifstyle change with low sodium diet and exercise   Continue losartan hct

## 2018-11-02 DIAGNOSIS — E785 Hyperlipidemia, unspecified: Secondary | ICD-10-CM | POA: Insufficient documentation

## 2018-11-02 DIAGNOSIS — E1169 Type 2 diabetes mellitus with other specified complication: Secondary | ICD-10-CM | POA: Insufficient documentation

## 2018-11-02 NOTE — Assessment & Plan Note (Signed)
LDL of 96 Diet control Disc goals for lipids and reasons to control them Rev last labs with pt Rev low sat fat diet in detail Disc eventual need for a statin - will disc at next visit after we get glucose in better control

## 2018-11-11 ENCOUNTER — Telehealth: Payer: 59 | Admitting: Family Medicine

## 2018-11-11 DIAGNOSIS — N390 Urinary tract infection, site not specified: Secondary | ICD-10-CM

## 2018-11-11 MED ORDER — CEPHALEXIN 500 MG PO CAPS: 500.0000 mg | ORAL_CAPSULE | Freq: Two times a day (BID) | ORAL | 0 refills | Status: AC

## 2018-11-11 NOTE — Progress Notes (Signed)

## 2018-11-17 ENCOUNTER — Telehealth: Payer: 59 | Admitting: Family

## 2018-11-17 DIAGNOSIS — B3731 Acute candidiasis of vulva and vagina: Secondary | ICD-10-CM

## 2018-11-17 DIAGNOSIS — B373 Candidiasis of vulva and vagina: Secondary | ICD-10-CM

## 2018-11-17 MED ORDER — FLUCONAZOLE 150 MG PO TABS: 150.0000 mg | ORAL_TABLET | Freq: Once | ORAL | 0 refills | Status: AC

## 2018-11-17 NOTE — Progress Notes (Signed)
Thank you for the details you included in the comment boxes. Those details are very helpful in determining the best course of treatment for you and help [place] to provide the best care. Per our telephone call,see treatment plan below. I hope you feel better soon.  We are sorry that you are not feeling well. Here is how we plan to help! Based on what you shared with me it looks like you: May have a yeast vaginosis  Vaginosis is an inflammation of the vagina that can result in discharge, itching and pain. The cause is usually a change in the normal balance of vaginal bacteria or an infection. Vaginosis can also result from reduced estrogen levels after menopause.  The most common causes of vaginosis are:   Bacterial vaginosis which results from an overgrowth of one on several organisms that are normally present in your vagina.   Yeast infections which are caused by a naturally occurring fungus called candida.   Vaginal atrophy (atrophic vaginosis) which results from the thinning of the vagina from reduced estrogen levels after menopause.   Trichomoniasis which is caused by a parasite and is commonly transmitted by sexual intercourse.  Factors that increase your risk of developing vaginosis include: [name] Kitchen Medications, such as antibiotics and steroids . Uncontrolled diabetes . Use of hygiene products such as bubble bath, vaginal spray or vaginal deodorant . Douching . Wearing damp or tight-fitting clothing . Using an intrauterine device (IUD) for birth control . Hormonal changes, such as those associated with pregnancy, birth control pills or menopause . Sexual activity . Having a sexually transmitted infection  Your treatment plan is A single Diflucan (fluconazole) 150mg  tablet once.  I have electronically sent this prescription into the pharmacy that you have chosen.  Be sure to take all of the medication as directed. Stop taking any medication if you develop a rash, tongue swelling or shortness  of breath. Mothers who are breast feeding should consider pumping and discarding their breast milk while on these antibiotics. However, there is no consensus that infant exposure at these doses would be harmful.  Remember that medication creams can weaken latex condoms. [name] Kitchen   HOME CARE:  Good hygiene may prevent some types of vaginosis from recurring and may relieve some symptoms:  . Avoid baths, hot tubs and whirlpool spas. Rinse soap from your outer genital area after a shower, and dry the area well to prevent irritation. Don't use scented or harsh soaps, such as those with deodorant or antibacterial action. [name] Kitchen Avoid irritants. These include scented tampons and pads. . Wipe from front to back after using the toilet. Doing so avoids spreading fecal bacteria to your vagina.  Other things that may help prevent vaginosis include:  [name] Kitchen Don't douche. Your vagina doesn't require cleansing other than normal bathing. Repetitive douching disrupts the normal organisms that reside in the vagina and can actually increase your risk of vaginal infection. Douching won't clear up a vaginal infection. . Use a latex condom. Both female and female latex condoms may help you avoid infections spread by sexual contact. . Wear cotton underwear. Also wear pantyhose with a cotton crotch. If you feel comfortable without it, skip wearing underwear to bed. Yeast thrives in [employer] Your symptoms should improve in the next day or two.  GET HELP RIGHT AWAY IF:  . You have pain in your lower abdomen ( pelvic area or over your ovaries) . You develop nausea or vomiting . You develop a fever . Your discharge changes  or worsens . You have persistent pain with intercourse . You develop shortness of breath, a rapid pulse, or you faint.  These symptoms could be signs of problems or infections that need to be evaluated by a medical provider now.  MAKE SURE YOU    Understand these instructions.  Will watch your  condition.  Will get help right away if you are not doing well or get worse.  Your e-visit answers were reviewed by a board certified advanced clinical practitioner to complete your personal care plan. Depending upon the condition, your plan could have included both over the counter or prescription medications. Please review your pharmacy choice to make sure that you have choses a pharmacy that is open for you to pick up any needed prescription, Your safety is important to [place]. If you have drug allergies check your prescription carefully.   You can use MyChart to ask questions about today's visit, request a non-urgent call back, or ask for a work or school excuse for 24 hours related to this e-Visit. If it has been greater than 24 hours you will need to follow up with your provider, or enter a new e-Visit to address those concerns. You will get a MyChart message within the next two days asking about your experience. I hope that your e-visit has been valuable and will speed your recovery.

## 2018-12-08 ENCOUNTER — Other Ambulatory Visit: Payer: Self-pay | Admitting: Hematology and Oncology

## 2018-12-23 ENCOUNTER — Other Ambulatory Visit: Payer: Self-pay | Admitting: Family Medicine

## 2019-01-06 DIAGNOSIS — Z6836 Body mass index (BMI) 36.0-36.9, adult: Secondary | ICD-10-CM | POA: Diagnosis not present

## 2019-01-06 DIAGNOSIS — Z1231 Encounter for screening mammogram for malignant neoplasm of breast: Secondary | ICD-10-CM | POA: Diagnosis not present

## 2019-01-06 DIAGNOSIS — C50911 Malignant neoplasm of unspecified site of right female breast: Secondary | ICD-10-CM | POA: Diagnosis not present

## 2019-01-06 DIAGNOSIS — Z01419 Encounter for gynecological examination (general) (routine) without abnormal findings: Secondary | ICD-10-CM | POA: Diagnosis not present

## 2019-01-07 ENCOUNTER — Other Ambulatory Visit: Payer: Self-pay | Admitting: Family Medicine

## 2019-01-29 DIAGNOSIS — L400 Psoriasis vulgaris: Secondary | ICD-10-CM | POA: Diagnosis not present

## 2019-02-03 ENCOUNTER — Encounter: Payer: Self-pay | Admitting: Family Medicine

## 2019-02-03 ENCOUNTER — Ambulatory Visit (INDEPENDENT_AMBULATORY_CARE_PROVIDER_SITE_OTHER): Payer: 59 | Admitting: Family Medicine

## 2019-02-03 VITALS — BP 136/78 | HR 71 | Temp 98.0°F | Ht 64.25 in | Wt 218.3 lb

## 2019-02-03 DIAGNOSIS — E119 Type 2 diabetes mellitus without complications: Secondary | ICD-10-CM | POA: Diagnosis not present

## 2019-02-03 DIAGNOSIS — C50411 Malignant neoplasm of upper-outer quadrant of right female breast: Secondary | ICD-10-CM

## 2019-02-03 DIAGNOSIS — E785 Hyperlipidemia, unspecified: Secondary | ICD-10-CM

## 2019-02-03 DIAGNOSIS — E039 Hypothyroidism, unspecified: Secondary | ICD-10-CM | POA: Diagnosis not present

## 2019-02-03 DIAGNOSIS — Z23 Encounter for immunization: Secondary | ICD-10-CM | POA: Diagnosis not present

## 2019-02-03 DIAGNOSIS — Z0000 Encounter for general adult medical examination without abnormal findings: Secondary | ICD-10-CM | POA: Diagnosis not present

## 2019-02-03 DIAGNOSIS — I10 Essential (primary) hypertension: Secondary | ICD-10-CM | POA: Diagnosis not present

## 2019-02-03 DIAGNOSIS — Z170 Estrogen receptor positive status [ER+]: Secondary | ICD-10-CM

## 2019-02-03 DIAGNOSIS — E1169 Type 2 diabetes mellitus with other specified complication: Secondary | ICD-10-CM | POA: Diagnosis not present

## 2019-02-03 DIAGNOSIS — Z6836 Body mass index (BMI) 36.0-36.9, adult: Secondary | ICD-10-CM

## 2019-02-03 DIAGNOSIS — M8580 Other specified disorders of bone density and structure, unspecified site: Secondary | ICD-10-CM

## 2019-02-03 LAB — COMPREHENSIVE METABOLIC PANEL
ALT: 19 U/L (ref 0–35)
AST: 19 U/L (ref 0–37)
Albumin: 3.8 g/dL (ref 3.5–5.2)
Alkaline Phosphatase: 82 U/L (ref 39–117)
BUN: 14 mg/dL (ref 6–23)
CALCIUM: 9.2 mg/dL (ref 8.4–10.5)
CHLORIDE: 100 meq/L (ref 96–112)
CO2: 33 meq/L — AB (ref 19–32)
Creatinine, Ser: 1.04 mg/dL (ref 0.40–1.20)
GFR: 54.78 mL/min — AB (ref >60.00)
GLUCOSE: 148 mg/dL — AB (ref 70–99)
POTASSIUM: 4 meq/L (ref 3.5–5.1)
Sodium: 139 mEq/L (ref 135–145)
Total Bilirubin: 0.5 mg/dL (ref 0.2–1.2)
Total Protein: 6.8 g/dL (ref 6.0–8.3)

## 2019-02-03 LAB — LIPID PANEL
CHOLESTEROL: 182 mg/dL (ref 0–200)
HDL: 53.2 mg/dL (ref >39.00)
LDL Cholesterol: 108 mg/dL — ABNORMAL HIGH (ref 0–99)
NonHDL: 128.46
Total CHOL/HDL Ratio: 3
Triglycerides: 104 mg/dL (ref 0.0–149.0)
VLDL: 20.8 mg/dL (ref 0.0–40.0)

## 2019-02-03 LAB — CBC WITH DIFFERENTIAL/PLATELET
BASOS ABS: 0 10*3/uL (ref 0.0–0.1)
BASOS PCT: 0.7 % (ref 0.0–3.0)
Eosinophils Absolute: 0.2 10*3/uL (ref 0.0–0.7)
Eosinophils Relative: 3.2 % (ref 0.0–5.0)
HEMATOCRIT: 36.1 % (ref 36.0–46.0)
Hemoglobin: 12.3 g/dL (ref 12.0–15.0)
Lymphocytes Relative: 27.6 % (ref 12.0–46.0)
Lymphs Abs: 1.7 10*3/uL (ref 0.7–4.0)
MCHC: 34.1 g/dL (ref 30.0–36.0)
MCV: 89 fl (ref 78.0–100.0)
MONOS PCT: 6.9 % (ref 3.0–12.0)
Monocytes Absolute: 0.4 10*3/uL (ref 0.1–1.0)
NEUTROS ABS: 3.9 10*3/uL (ref 1.4–7.7)
Neutrophils Relative %: 61.6 % (ref 43.0–77.0)
PLATELETS: 258 10*3/uL (ref 150.0–400.0)
RBC: 4.05 Mil/uL (ref 3.87–5.11)
RDW: 12.5 % (ref 11.5–15.5)
WBC: 6.3 10*3/uL (ref 4.0–10.5)

## 2019-02-03 LAB — HEMOGLOBIN A1C: Hgb A1c MFr Bld: 6.8 % — ABNORMAL HIGH (ref 4.6–6.5)

## 2019-02-03 LAB — TSH: TSH: 3.48 u[IU]/mL (ref 0.35–4.50)

## 2019-02-03 MED ORDER — LEVOTHYROXINE SODIUM 50 MCG PO TABS: ORAL_TABLET | ORAL | 3 refills | Status: DC

## 2019-02-03 MED ORDER — OMEPRAZOLE 20 MG PO CPDR: DELAYED_RELEASE_CAPSULE | ORAL | 3 refills | Status: DC

## 2019-02-03 MED ORDER — LOSARTAN POTASSIUM-HCTZ 100-25 MG PO TABS: 1.0000 | ORAL_TABLET | Freq: Every day | ORAL | 3 refills | Status: DC

## 2019-02-03 MED ORDER — METOPROLOL SUCCINATE ER 50 MG PO TB24: 50.0000 mg | ORAL_TABLET | Freq: Every day | ORAL | 3 refills | Status: DC

## 2019-02-03 MED ORDER — GLIPIZIDE ER 5 MG PO TB24: 5.0000 mg | ORAL_TABLET | Freq: Every day | ORAL | 3 refills | Status: DC

## 2019-02-03 MED ORDER — ALENDRONATE SODIUM 70 MG PO TABS: 70.0000 mg | ORAL_TABLET | ORAL | 3 refills | Status: DC

## 2019-02-03 NOTE — Assessment & Plan Note (Signed)
Discussed how this problem influences overall health and the risks it imposes  Reviewed plan for weight loss with lower calorie diet (via better food choices and also portion control or program like weight watchers) and exercise building up to or more than 30 minutes 5 days per week including some aerobic activity    

## 2019-02-03 NOTE — Assessment & Plan Note (Signed)
bp in fair control at this time  BP Readings from Last 1 Encounters:  02/03/19 136/78   No changes needed Most recent labs reviewed  Disc lifstyle change with low sodium diet and exercise  Labs today

## 2019-02-03 NOTE — Assessment & Plan Note (Signed)
TSH today  No clinical changes Will change levothy dose if needed

## 2019-02-03 NOTE — Progress Notes (Signed)
Subjective:    Patient ID: Diana Deleon, female    DOB: Oct 16, 1961, 58 y.o.   MRN: [id number]  HPI  Here for health maintenance exam and to review chronic medical problems    Has been feeling well   She had injury from glass in her R middle finger - happened early jan  Took it out - bled a lot and bruised  It was fine and healed  Now if feels sore and does not want to bend  Hurts to make a fist and feels stiff /heat helps   Wt Readings from Last 3 Encounters:  02/03/19 218 lb 5 oz (99 kg)  10/31/18 215 lb 8 oz (97.8 kg)  07/31/18 221 lb (100.2 kg)  exercise - 2 days per week - recumbent bike (knee pain - getting a shot soon)  Diet - pretty good  37.18 kg/m   PNA vaccine - has not had  Flu shot 11/19  Zoster status - interested in shingrix   [facility]  Personal hx of breast cancer with reconstructive sx tx with arimidex  Self breast exam - no lumps or changes   Bone density screening  dexa 1/19  Osteopenia with lowest T score at LS -2.3 On fosamax - no side effects  No falls or fractures this year   Eye exam 1/19 -no retinopathy   Tetanus shot 7/10 -will get tetanus next time   Pap 1/19 neg - at gyn  Has had a hysterectomy   Colonoscopy 1/18 nl with 10 y recall   bp is stable today  No cp or palpitations or headaches or edema  No side effects to medicines  BP Readings from Last 3 Encounters:  02/03/19 136/78  10/31/18 124/82  07/31/18 (!) 143/90      DM2 Lab Results  Component Value Date   HGBA1C 7.6 (H) 10/20/2018   Due for labs  Intolerant of metformin xr  Started low dose glipizide last visit  2 episodes with glucose in low 90s and it makes her shaky She had a libre for a while with last insurance- over 200 every time she eats   Due for lipids Lab Results  Component Value Date   CHOL 177 10/20/2018   HDL 52.70 10/20/2018   LDLCALC 96 10/20/2018   TRIG 142.0 10/20/2018   CHOLHDL 3 10/20/2018    diet controlled   Hypothyroidism  She feels a "popping" on r side of neck - fleeting pain 2-3 times per week ? If when she turns her head  No swelling or goiter  Pt has no clinical changes- No change in energy level/ hair or skin/ edema and no tremor Lab Results  Component Value Date   TSH 4.45 01/20/2018     Due for tsh   Patient Active Problem List   Diagnosis Date Noted  . Hyperlipidemia associated with type 2 diabetes mellitus ([place]) 11/02/2018  . Low back pain 03/21/2018  . Osteopenia 01/05/2018  . Aromatase inhibitor use 12/25/2017  . Psoriasis 11/02/2016  . Colon cancer screening 11/02/2016  . Class 2 severe obesity due to excess calories with serious comorbidity and body mass index (BMI) of 36.0 to 36.9 in adult [facility]) 10/31/2015  . Breast cancer of upper-outer quadrant of right female breast ([place]) 10/25/2014  . Controlled type 2 diabetes mellitus without complication, without long-term current use of insulin ([place]) 07/14/2013  . Routine general medical examination at a health care facility 10/12/2012  .  Hypothyroidism 06/03/2012  . Overactive bladder 05/27/2012  . HSV infection 07/07/2010  . VERTIGO 08/01/2009  . URINARY URGENCY 07/15/2009  . LOW BACK PAIN, CHRONIC 05/17/2008  . HEADACHE, CHRONIC 05/17/2008  . Essential hypertension 02/12/2008   Past Medical History:  Diagnosis Date  . Anemia    "off and on"  . Arthritis    "right toes" (03/24/2015)  . Cancer of right breast ([place]) 2015  . Cyst of cystic duct    chest- central location, taking Cephalexin currently  . Diabetes mellitus without complication ([place])   . Fibroids    hyst. 2006  . GERD (gastroesophageal reflux disease)   . History of abnormal Pap smear    has had leep in past  . History of cold sores    has used valtrex in past  . History of recurrent UTIs   . Hypertension   . Hypothyroidism   . Intermittent vertigo   . Kidney stones   . Seborrheic dermatitis    local on R scalp and in ear  (failed mult meds- uses steroid spray)  . Thyroid disease    Past Surgical History:  Procedure Laterality Date  . BREAST RECONSTRUCTION WITH PLACEMENT OF TISSUE EXPANDER AND FLEX HD (ACELLULAR HYDRATED DERMIS) Right 11/25/2014   Procedure: RIGHT BREAST RECONSTRUCTION WITH PLACEMENT OF TISSUE EXPANDER AND USE OF ACELLULAR DERMRAL MATRIX ;  Surgeon: Crissie Reese, MD;  Location: [place];  Service: Plastics;  Laterality: Right;  . BREAST REDUCTION SURGERY Left 03/24/2015   Procedure: LEFT BREAST REDUCTION  (BREAST);  Surgeon: Crissie Reese, MD;  Location: [place];  Service: Plastics;  Laterality: Left;  . COLONOSCOPY    . COLONOSCOPY WITH PROPOFOL N/A 12/28/2016   Procedure: COLONOSCOPY WITH PROPOFOL;  Surgeon: Jonathon Bellows, MD;  Location: [facility] END[facility];  Service: Endoscopy;  Laterality: N/A;  . LAPAROSCOPIC CHOLECYSTECTOMY  2003  . LITHOTRIPSY  ~ 2007  . REMOVAL OF TISSUE EXPANDER Right 03/24/2015  . REMOVAL OF TISSUE EXPANDER AND PLACEMENT OF IMPLANT Right 03/24/2015   w/delayed Architect  . REMOVAL OF TISSUE EXPANDER AND PLACEMENT OF IMPLANT Right 03/24/2015   Procedure: REMOVAL OF RIGHT BREAST TISSUE EXPANDER AND DELAYED BREAST RECONSTRUCTION WITH PLACEMENT OF IMPLANT;  Surgeon: Crissie Reese, MD;  Location: [place];  Service: Plastics;  Laterality: Right;  . SIMPLE MASTECTOMY WITH AXILLARY SENTINEL NODE BIOPSY Right 11/25/2014   Procedure: RIGHT TOTAL  MASTECTOMY WITH AXILLARY SENTINEL NODE BIOPSY ;  Surgeon: Excell Seltzer, MD;  Location: [place];  Service: General;  Laterality: Right;  . TUBAL LIGATION    . VAGINAL DELIVERY  x3  . VAGINAL HYSTERECTOMY  2006   "fibroids"   Social History   Tobacco Use  . Smoking status: Never Smoker  . Smokeless tobacco: Never Used  Substance Use Topics  . Alcohol use: No    Alcohol/week: 0.0 standard drinks  . Drug use: No   Family History  Problem Relation Age of Onset  . Alcohol abuse Father   . COPD Father   . Hypertension Father   . Hypertension  Mother   . Parkinson's disease Mother    Allergies  Allergen Reactions  . Metformin And Related     Diarrhea (even XR)   . Norvasc [Amlodipine Besylate] Swelling   Current Outpatient Medications on File Prior to Visit  Medication Sig Dispense Refill  . anastrozole (ARIMIDEX) 1 MG tablet TAKE 1 TABLET(1 MG) BY MOUTH DAILY 90 tablet 3  . aspirin EC 81 MG tablet Take 81 mg by mouth  daily.    . augmented betamethasone dipropionate (DIPROLENE-AF) 0.05 % ointment Apply 1 application topically daily as needed.     . Calcium Carb-Cholecalciferol (CALCIUM 600/VITAMIN D3) 600-800 MG-UNIT TABS Take 2 tablets by mouth daily.    . meloxicam (MOBIC) 7.5 MG tablet Take 7.5 mg by mouth daily.    Glory Rosebush DELICA LANCETS FINE MISC Use to check blood sugar once daily and as needed (dx. E11.9) 100 each 1  . ONETOUCH VERIO test strip USE TO CHECK BLOOD SUGAR ONCE DAILY AND AS NEEDED 100 each 2  . valACYclovir (VALTREX) 500 MG tablet Take 1 tablet (500 mg total) by mouth 2 (two) times daily. For 5 days for an outbreak 10 tablet 3  . venlafaxine XR (EFFEXOR-XR) 75 MG 24 hr capsule TAKE 1 CAPSULE(75 MG) BY MOUTH DAILY WITH BREAKFAST 90 capsule 0   No current facility-administered medications on file prior to visit.     Review of Systems  Constitutional: Negative for activity change, appetite change, fatigue, fever and unexpected weight change.  HENT: Negative for congestion, ear pain, rhinorrhea, sinus pressure and sore throat.   Eyes: Negative for pain, redness and visual disturbance.  Respiratory: Negative for cough, shortness of breath and wheezing.   Cardiovascular: Negative for chest pain and palpitations.  Gastrointestinal: Negative for abdominal pain, blood in stool, constipation and diarrhea.  Endocrine: Negative for polydipsia and polyuria.  Genitourinary: Negative for dysuria, frequency and urgency.  Musculoskeletal: Negative for arthralgias, back pain and myalgias.       R middle finger is  swollen/sore ever since injury with glass (no redness)  Skin: Negative for pallor and rash.  Allergic/Immunologic: Negative for environmental allergies.  Neurological: Negative for dizziness, syncope and headaches.  Hematological: Negative for adenopathy. Does not bruise/bleed easily.  Psychiatric/Behavioral: Negative for decreased concentration and dysphoric mood. The patient is not nervous/anxious.        Objective:   Physical Exam Constitutional:      General: She is not in acute distress.    Appearance: Normal appearance. She is well-developed. She is obese. She is not ill-appearing.  HENT:     Head: Normocephalic and atraumatic.     Right Ear: Tympanic membrane, ear canal and external ear normal.     Left Ear: Tympanic membrane, ear canal and external ear normal.     Nose: Nose normal.     Mouth/Throat:     Mouth: Mucous membranes are moist.     Pharynx: Oropharynx is clear.  Eyes:     General: No scleral icterus.       Right eye: No discharge.        Left eye: No discharge.     Conjunctiva/sclera: Conjunctivae normal.     Pupils: Pupils are equal, round, and reactive to light.  Neck:     Musculoskeletal: Normal range of motion and neck supple.     Thyroid: No thyromegaly.     Vascular: No carotid bruit or JVD.  Cardiovascular:     Rate and Rhythm: Normal rate and regular rhythm.     Pulses: Normal pulses.     Heart sounds: Normal heart sounds. No gallop.   Pulmonary:     Effort: Pulmonary effort is normal. No respiratory distress.     Breath sounds: Normal breath sounds. No wheezing or rales.  Abdominal:     General: Bowel sounds are normal. There is no distension.     Palpations: Abdomen is soft. There is no mass.  Tenderness: There is no abdominal tenderness.     Hernia: No hernia is present.  Genitourinary:    Comments: Sees gyn for breast and pelvic exam  Musculoskeletal:        General: No tenderness or deformity.     Right lower leg: No edema.     Left  lower leg: No edema.     Comments: L middle finger is mildly swollen (soft tissue) with mild tenderness (no erythema or warmth)  Lymphadenopathy:     Cervical: No cervical adenopathy.  Skin:    General: Skin is warm and dry.     Capillary Refill: Capillary refill takes less than 2 seconds.     Coloration: Skin is not pale.     Findings: No erythema or rash.     Comments: Solar lentigines diffusely   Neurological:     General: No focal deficit present.     Mental Status: She is alert.     Cranial Nerves: No cranial nerve deficit.     Motor: No abnormal muscle tone.     Coordination: Coordination normal.     Deep Tendon Reflexes: Reflexes are normal and symmetric.  Psychiatric:        Mood and Affect: Mood normal.           Assessment & Plan:   Problem List Items Addressed This Visit      Cardiovascular and Mediastinum   Essential hypertension    bp in fair control at this time  BP Readings from Last 1 Encounters:  02/03/19 136/78   No changes needed Most recent labs reviewed  Disc lifstyle change with low sodium diet and exercise  Labs today      Relevant Medications   losartan-hydrochlorothiazide (HYZAAR) 100-25 MG tablet   metoprolol succinate (TOPROL-XL) 50 MG 24 hr tablet   Other Relevant Orders   CBC with Differential/Platelet (Completed)   Comprehensive metabolic panel (Completed)   Lipid panel (Completed)   TSH (Completed)     Endocrine   Hypothyroidism    TSH today  No clinical changes Will change levothy dose if needed       Relevant Medications   levothyroxine (SYNTHROID, LEVOTHROID) 50 MCG tablet   metoprolol succinate (TOPROL-XL) 50 MG 24 hr tablet   Other Relevant Orders   TSH (Completed)   Controlled type 2 diabetes mellitus without complication, without long-term current use of insulin (H[facility])    A1C today  Intolerant of metformin  Now on glipizide (some hyperglycemia) , no readings below 90 Strongly enc wt loss and healthy diet and  exercise  utd eye exam        Relevant Medications   glipiZIDE (GLUCOTROL XL) 5 MG 24 hr tablet   losartan-hydrochlorothiazide (HYZAAR) 100-25 MG tablet   Other Relevant Orders   Hemoglobin A1c (Completed)   Hyperlipidemia associated with type 2 diabetes mellitus (HCC)    Lipid panel today  Disc goals for lipids and reasons to control them Rev last labs with pt Rev low sat fat diet in detail   In light of DM-should be on statin/will discuss      Relevant Medications   glipiZIDE (GLUCOTROL XL) 5 MG 24 hr tablet   losartan-hydrochlorothiazide (HYZAAR) 100-25 MG tablet   Other Relevant Orders   Lipid panel (Completed)     Musculoskeletal and Integument   Osteopenia    Tolerating fosamax now  dexa rev 1/19  Additional risk from arimidex  No falls or fx Taking ca and D Plan  dexa every 2 y        Other   Routine general medical examination at a health care facility - Primary    Reviewed health habits including diet and exercise and skin cancer prevention Reviewed appropriate screening tests for age  Also reviewed health mt list, fam hx and immunization status , as well as social and family history   See HPI Labs ordered Tetanus shot and pna 23 shot today Discussed shingrix vaccine       Relevant Orders   Pneumococcal polysaccharide vaccine 23-valent greater than or equal to 2yo subcutaneous/IM (Completed)   Tdap vaccine greater than or equal to 7yo IM (Completed)   Breast cancer of upper-outer quadrant of right female breast ([place])    Doing well with f/u  Taking arimidex  Sees gyn for breast exam in addn to oncology      Class 2 severe obesity due to excess calories with serious comorbidity and body mass index (BMI) of 36.0 to 36.9 in adult [facility])    Discussed how this problem influences overall health and the risks it imposes  Reviewed plan for weight loss with lower calorie diet (via better food choices and also portion control or program like weight watchers) and  exercise building up to or more than 30 minutes 5 days per week including some aerobic activity         Relevant Medications   glipiZIDE (GLUCOTROL XL) 5 MG 24 hr tablet    Other Visit Diagnoses    Need for Tdap vaccination       Relevant Orders   Tdap vaccine greater than or equal to 7yo IM (Completed)   Need for 23-polyvalent pneumococcal polysaccharide vaccine       Relevant Orders   Pneumococcal polysaccharide vaccine 23-valent greater than or equal to 2yo subcutaneous/IM (Completed)

## 2019-02-03 NOTE — Assessment & Plan Note (Signed)
Reviewed health habits including diet and exercise and skin cancer prevention Reviewed appropriate screening tests for age  Also reviewed health mt list, fam hx and immunization status , as well as social and family history   See HPI Labs ordered Tetanus shot and pna 23 shot today Discussed shingrix vaccine

## 2019-02-03 NOTE — Assessment & Plan Note (Addendum)
Doing well with f/u  Taking arimidex  Sees gyn for breast exam in addn to oncology

## 2019-02-03 NOTE — Assessment & Plan Note (Signed)
A1C today  Intolerant of metformin  Now on glipizide (some hyperglycemia) , no readings below 90 Strongly enc wt loss and healthy diet and exercise  utd eye exam

## 2019-02-03 NOTE — Patient Instructions (Addendum)
Check with your insurance to see if the shingrix vaccine is affordable  Then call us to get on a wait list (also at pharmacy)   Pneumonia shot today  Tetanus shot today   Labs today   If finger symptoms get worse please let us know

## 2019-02-03 NOTE — Assessment & Plan Note (Signed)
Tolerating fosamax now  dexa rev 1/19  Additional risk from arimidex  No falls or fx Taking ca and D Plan dexa every 2 y

## 2019-02-03 NOTE — Assessment & Plan Note (Signed)
Lipid panel today  Disc goals for lipids and reasons to control them Rev last labs with pt Rev low sat fat diet in detail   In light of DM-should be on statin/will discuss

## 2019-02-04 ENCOUNTER — Telehealth: Payer: Self-pay | Admitting: Family Medicine

## 2019-02-04 MED ORDER — ROSUVASTATIN CALCIUM 5 MG PO TABS: 5.0000 mg | ORAL_TABLET | Freq: Every day | ORAL | 11 refills | Status: DC

## 2019-02-04 NOTE — Telephone Encounter (Signed)
I sent crestor  Alert me if side effects or problems  Please f/u in 6 mo with lab prior

## 2019-02-04 NOTE — Telephone Encounter (Signed)
-----   Message from Diana Deleon, [place] sent at 02/04/2019 12:47 PM EST ----- Pt viewed lab on mychart. She is okay with you sending in Rx. [facility] on [address]   Pt also wanted me to ask when is she suppose to f/u with you because she doesn't have anything scheduled

## 2019-02-05 NOTE — Telephone Encounter (Signed)
Pt notified Rx sent and advised of Dr. Marliss Coots comments. F/u and lab appt scheduled

## 2019-02-26 DIAGNOSIS — M1712 Unilateral primary osteoarthritis, left knee: Secondary | ICD-10-CM | POA: Diagnosis not present

## 2019-03-03 ENCOUNTER — Other Ambulatory Visit: Payer: Self-pay | Admitting: Hematology and Oncology

## 2019-05-04 ENCOUNTER — Other Ambulatory Visit: Payer: Self-pay | Admitting: Family Medicine

## 2019-06-03 ENCOUNTER — Other Ambulatory Visit: Payer: Self-pay | Admitting: Hematology and Oncology

## 2019-07-15 NOTE — Progress Notes (Signed)
Patient Care Team: Tower, Wynelle Fanny, MD as PCP - General Nicholas Lose, MD as Consulting Physician (Hematology and Oncology) Excell Seltzer, MD as Consulting Physician (General Surgery) Eppie Gibson, MD as Attending Physician (Radiation Oncology) Holley Bouche, NP (Inactive) as Nurse Practitioner (Nurse Practitioner)  DIAGNOSIS:    ICD-10-CM   1. Malignant neoplasm of upper-outer quadrant of right breast in female, estrogen receptor positive ([place])  C50.411    Z17.0     SUMMARY OF ONCOLOGIC HISTORY: Oncology History  Breast cancer of upper-outer quadrant of right female breast ([place])  10/20/2014 Initial Diagnosis   Right breast invasive and in situ mammary cancer grade 2 E-cadherin positive, invasive ductal carcinoma with DCIS focal atypical cells negative for atypia and ER 70% PR 80% HER-2 negative ratio 1.26 Ki-67 27%   10/22/2014 Breast MRI   Right breast upper outer quadrant 2.3 cm biopsy-proven malignancy   11/25/2014 Surgery   Right simple mastectomy: Tumor #1 upper lateral invasive high-grade ductal carcinoma 2.8 cm plus high-grade DCIS plus ALH and LCIS; tumor #2 upper medial DCIS 1.5 cm. Margins negative.   11/25/2014 Oncotype testing   18 (11% 10-year risk of distant recurrence on Tamoxifen alone).    01/03/2015 -  Anti-estrogen oral therapy   Tamoxifen  20 mg daily discontinued 03/28/2015 and switched to anastrozole 1 mg 03/23/15 daily when she was found to be postmenopausal. Plan treatment duration 5 years   03/24/2015 Surgery   Left breast mammoplasty: Benign     CHIEF COMPLIANT: Follow-up of right breast cancer on anastrozole therapy  INTERVAL HISTORY: Diana Deleon is a 58 y.o. with above-mentioned history of right breast cancer treated with mastectomy and who is currently on anti-estrogen therapy with anastrozole. She presents to the clinic today for annual follow-up.  She stays very busy taking care of her very ill mother.  REVIEW OF SYSTEMS:    Constitutional: Denies fevers, chills or abnormal weight loss Eyes: Denies blurriness of vision Ears, nose, mouth, throat, and face: Denies mucositis or sore throat Respiratory: Denies cough, dyspnea or wheezes Cardiovascular: Denies palpitation, chest discomfort Gastrointestinal: Denies nausea, heartburn or change in bowel habits Skin: Denies abnormal skin rashes Lymphatics: Denies new lymphadenopathy or easy bruising Neurological: Denies numbness, tingling or new weaknesses Behavioral/Psych: Mood is stable, no new changes  Extremities: No lower extremity edema Breast: denies any pain or lumps or nodules in either breasts All other systems were reviewed with the patient and are negative.  I have reviewed the past medical history, past surgical history, social history and family history with the patient and they are unchanged from previous note.  ALLERGIES:  is allergic to metformin and related and norvasc [amlodipine besylate].  MEDICATIONS:  Current Outpatient Medications  Medication Sig Dispense Refill  . alendronate (FOSAMAX) 70 MG tablet Take 1 tablet (70 mg total) by mouth every 7 (seven) days. Take with a full glass of water on an empty stomach. 12 tablet 3  . anastrozole (ARIMIDEX) 1 MG tablet Take 1 tablet (1 mg total) by mouth daily. 90 tablet 3  . aspirin EC 81 MG tablet Take 81 mg by mouth daily.    [name] Kitchen augmented betamethasone dipropionate (DIPROLENE-AF) 0.05 % ointment Apply 1 application topically daily as needed.     . Calcium Carb-Cholecalciferol (CALCIUM 600/VITAMIN D3) 600-800 MG-UNIT TABS Take 2 tablets by mouth daily.    [name] Kitchen glipiZIDE (GLUCOTROL XL) 5 MG 24 hr tablet Take 1 tablet (5 mg total) by mouth daily with breakfast. 90 tablet 3  .  levothyroxine (SYNTHROID, LEVOTHROID) 50 MCG tablet TAKE 1 TABLET(50 MCG) BY MOUTH DAILY 90 tablet 3  . losartan-hydrochlorothiazide (HYZAAR) 100-25 MG tablet Take 1 tablet by mouth daily. 90 tablet 3  . meloxicam (MOBIC) 7.5 MG tablet  Take 7.5 mg by mouth daily.    . metoprolol succinate (TOPROL-XL) 50 MG 24 hr tablet Take 1 tablet (50 mg total) by mouth daily. Take with or immediately following a meal. 90 tablet 3  . omeprazole (PRILOSEC) 20 MG capsule TAKE 1 CAPSULE BY MOUTH DAILY BEFORE BREAKFAST 90 capsule 3  . ONETOUCH DELICA LANCETS FINE MISC Use to check blood sugar once daily and as needed (dx. E11.9) 100 each 1  . ONETOUCH VERIO test strip USE TO CHECK BLOOD SUGAR ONCE DAILY AND AS NEEDED 100 each 2  . rosuvastatin (CRESTOR) 5 MG tablet Take 1 tablet (5 mg total) by mouth daily. 30 tablet 11  . valACYclovir (VALTREX) 500 MG tablet Take 1 tablet (500 mg total) by mouth 2 (two) times daily. For 5 days for an outbreak 10 tablet 3  . venlafaxine XR (EFFEXOR-XR) 75 MG 24 hr capsule TAKE 1 CAPSULE(75 MG) BY MOUTH DAILY WITH BREAKFAST 90 capsule 0   No current facility-administered medications for this visit.     PHYSICAL EXAMINATION: ECOG PERFORMANCE STATUS: 1 - Symptomatic but completely ambulatory  Vitals:   07/17/19 0844  BP: (!) 154/86  Pulse: 80  Resp: 18  Temp: 97.8 F (36.6 C)  SpO2: 90%   Filed Weights   07/17/19 0844  Weight: 219 lb 1.6 oz (99.4 kg)    GENERAL: alert, no distress and comfortable SKIN: skin color, texture, turgor are normal, no rashes or significant lesions EYES: normal, Conjunctiva are pink and non-injected, sclera clear OROPHARYNX: no exudate, no erythema and lips, buccal mucosa, and tongue normal  NECK: supple, thyroid normal size, non-tender, without nodularity LYMPH: no palpable lymphadenopathy in the cervical, axillary or inguinal LUNGS: clear to auscultation and percussion with normal breathing effort HEART: regular rate & rhythm and no murmurs and no lower extremity edema ABDOMEN: abdomen soft, non-tender and normal bowel sounds MUSCULOSKELETAL: no cyanosis of digits and no clubbing  NEURO: alert & oriented x 3 with fluent speech, no focal motor/sensory deficits  EXTREMITIES: No lower extremity edema    LABORATORY DATA:  I have reviewed the data as listed CMP Latest Ref Rng & Units 02/03/2019 10/20/2018 04/21/2018  Glucose 70 - 99 mg/dL 148(H) 240(H) 181(H)  BUN 6 - 23 mg/dL _0 Creatinine 0.40 - 1.20 mg/dL 1.04 1.13 0.95  Sodium 135 - 145 mEq/L 139 141 139  Potassium 3.5 - 5.1 mEq/L 4.0 3.7 4.3  Chloride 96 - 112 mEq/L 100 97 98  CO2 19 - 32 mEq/L 33(H) 36(H) 34(H)  Calcium 8.4 - 10.5 mg/dL 9.2 9.8 9.3  Total Protein 6.0 - 8.3 g/dL 6.8 7.0 7.1  Total Bilirubin 0.2 - 1.2 mg/dL 0.5 0.6 0.6  Alkaline Phos 39 - 117 U/L 82 97 106  AST 0 - 37 U/L _1 ALT 0 - 35 U/L _2 Lab Results  Component Value Date   WBC 6.3 02/03/2019   HGB 12.3 02/03/2019   HCT 36.1 02/03/2019   MCV 89.0 02/03/2019   PLT 258.0 02/03/2019   NEUTROABS 3.9 02/03/2019    ASSESSMENT & PLAN:  Breast cancer of upper-outer quadrant of right female breast Right breast invasive ductal carcinoma 2.8 cm status post mastectomy grade 3 with  high-grade DCIS, ALH and LCIS; separate tumor which was DCIS 1.5 cm, 1 SLN negative, ER 86%, PR 95%, HER-2 negative ratio 1.52, Ki-67 27% T2 N0 M0 stage II a, Oncotype Dx score 18 (11% ROR) Low risk; started tamoxifen 12/27/2014 Switched to anastrozole April 2016  Anastrozole Toxicities: 1. Severe hot flashes On Effexor.Markedly improved  2. Tingling and numbness when she lifts her arm above the head: Resolved Denies any hot flashes.  Bone density 01/02/2018: T score -2.3: Osteopenia I recommended bisphosphonate therapy Takes Fosamax if she remembers it.  Her mother is at home and has lost 40 pounds and she is trying to help her gain weight and get better.   Breast Cancer Surveillance: 1. Breast exam 07/15/2018 normal 2. [facility]    RTC in 1 year for follow up.    No orders of the defined types were placed in this encounter.  The patient has a  good understanding of the overall plan. she agrees with it. she will call with any problems that may develop before the next visit here.  Nicholas Lose, MD 07/17/2019  Julious Oka Dorshimer am acting as scribe for Dr. Nicholas Lose.  I have reviewed the above documentation for accuracy and completeness, and I agree with the above.

## 2019-07-17 ENCOUNTER — Other Ambulatory Visit: Payer: Self-pay

## 2019-07-17 ENCOUNTER — Inpatient Hospital Stay: Payer: 59 | Attending: Hematology and Oncology | Admitting: Hematology and Oncology

## 2019-07-17 DIAGNOSIS — C50411 Malignant neoplasm of upper-outer quadrant of right female breast: Secondary | ICD-10-CM | POA: Diagnosis present

## 2019-07-17 DIAGNOSIS — M8580 Other specified disorders of bone density and structure, unspecified site: Secondary | ICD-10-CM

## 2019-07-17 DIAGNOSIS — Z170 Estrogen receptor positive status [ER+]: Secondary | ICD-10-CM

## 2019-07-17 DIAGNOSIS — Z79811 Long term (current) use of aromatase inhibitors: Secondary | ICD-10-CM | POA: Diagnosis not present

## 2019-07-17 DIAGNOSIS — Z7984 Long term (current) use of oral hypoglycemic drugs: Secondary | ICD-10-CM

## 2019-07-17 DIAGNOSIS — Z79899 Other long term (current) drug therapy: Secondary | ICD-10-CM | POA: Diagnosis not present

## 2019-07-17 DIAGNOSIS — Z7982 Long term (current) use of aspirin: Secondary | ICD-10-CM | POA: Diagnosis not present

## 2019-07-17 DIAGNOSIS — Z9011 Acquired absence of right breast and nipple: Secondary | ICD-10-CM | POA: Diagnosis not present

## 2019-07-17 MED ORDER — ANASTROZOLE 1 MG PO TABS: 1.0000 mg | ORAL_TABLET | Freq: Every day | ORAL | 3 refills | Status: DC

## 2019-07-17 NOTE — Assessment & Plan Note (Signed)
Right breast invasive ductal carcinoma 2.8 cm status post mastectomy grade 3 with high-grade DCIS, ALH and LCIS; separate tumor which was DCIS 1.5 cm, 1 SLN negative, ER 86%, PR 95%, HER-2 negative ratio 1.52, Ki-67 27% T2 N0 M0 stage II a, Oncotype Dx score 18 (11% ROR) Low risk; started tamoxifen 12/27/2014 Switched to anastrozole April 2016  Anastrozole Toxicities: 1. Severe hot flashes On Effexor.Markedly improved  2. Tingling and numbness when she lifts her arm above the head: Resolved Denies any hot flashes.  Bone density 01/02/2018: T score -2.3: Osteopenia I recommended bisphosphonate therapy Patient has a prescription for Fosamax given by her primary care physician.  She will now start taking it.  Breast Cancer Surveillance: 1. Breast exam 07/15/2018 normal 2. [facility]on the left breast [facility] 2019 [facility]  RTC in 1 year for follow up.

## 2019-07-29 ENCOUNTER — Telehealth: Payer: Self-pay | Admitting: Family Medicine

## 2019-07-29 DIAGNOSIS — E1169 Type 2 diabetes mellitus with other specified complication: Secondary | ICD-10-CM

## 2019-07-29 DIAGNOSIS — E119 Type 2 diabetes mellitus without complications: Secondary | ICD-10-CM

## 2019-07-29 DIAGNOSIS — I10 Essential (primary) hypertension: Secondary | ICD-10-CM

## 2019-07-29 NOTE — Telephone Encounter (Signed)
-----   Message from Ellamae Sia sent at 07/20/2019  3:48 PM EDT ----- Regarding: Lab orders for Thursday, 8.13.20 Ab orders for f/u

## 2019-07-30 ENCOUNTER — Telehealth: Payer: Self-pay

## 2019-07-30 ENCOUNTER — Other Ambulatory Visit: Payer: Self-pay

## 2019-07-30 ENCOUNTER — Other Ambulatory Visit: Payer: 59

## 2019-07-30 NOTE — Telephone Encounter (Signed)
I spoke to patient. Her elderly mother lives with her and she had an episode last night.  Patient said she'll try to reschedule the lab appointment before her appointment on Tuesday.  If she can't, she'll reschedule both appointments.

## 2019-07-30 NOTE — Telephone Encounter (Signed)
[facility] Night - Client Nonclinical Telephone Record AccessNurse Client [place] Night - Client Client Site [place] Physician Loura Pardon - MD Contact Type Call Who Is Calling Patient / Member / Family / Caregiver Caller Name [place] Phone Number [phone] Patient Name Carisa Backhaus Patient DOB 27-Apr-1961 Call Type Message Only Information Provided Reason for Call Request to [place] Appointment Initial Comment Caller states she was suppose to get blood work and had emergency. Call Closed By: Artis Flock Transaction Date/Time: 07/30/2019 7:51:41 AM (ET)

## 2019-08-02 ENCOUNTER — Other Ambulatory Visit: Payer: Self-pay | Admitting: Hematology and Oncology

## 2019-08-04 ENCOUNTER — Ambulatory Visit: Payer: 59 | Admitting: Family Medicine

## 2019-09-01 ENCOUNTER — Other Ambulatory Visit: Payer: Self-pay | Admitting: Hematology and Oncology

## 2019-09-15 LAB — HM DIABETES EYE EXAM

## 2019-09-23 ENCOUNTER — Encounter: Payer: Self-pay | Admitting: Family Medicine

## 2019-12-23 ENCOUNTER — Other Ambulatory Visit: Payer: Self-pay

## 2019-12-23 ENCOUNTER — Encounter: Payer: Self-pay | Admitting: Family Medicine

## 2019-12-23 ENCOUNTER — Ambulatory Visit: Payer: Managed Care, Other (non HMO) | Admitting: Family Medicine

## 2019-12-23 VITALS — BP 136/78 | HR 80 | Temp 96.9°F | Ht 64.25 in | Wt 217.4 lb

## 2019-12-23 DIAGNOSIS — E119 Type 2 diabetes mellitus without complications: Secondary | ICD-10-CM | POA: Diagnosis not present

## 2019-12-23 DIAGNOSIS — B373 Candidiasis of vulva and vagina: Secondary | ICD-10-CM

## 2019-12-23 DIAGNOSIS — I10 Essential (primary) hypertension: Secondary | ICD-10-CM | POA: Diagnosis not present

## 2019-12-23 DIAGNOSIS — Z170 Estrogen receptor positive status [ER+]: Secondary | ICD-10-CM

## 2019-12-23 DIAGNOSIS — E039 Hypothyroidism, unspecified: Secondary | ICD-10-CM

## 2019-12-23 DIAGNOSIS — Z23 Encounter for immunization: Secondary | ICD-10-CM | POA: Diagnosis not present

## 2019-12-23 DIAGNOSIS — E785 Hyperlipidemia, unspecified: Secondary | ICD-10-CM | POA: Diagnosis not present

## 2019-12-23 DIAGNOSIS — C50411 Malignant neoplasm of upper-outer quadrant of right female breast: Secondary | ICD-10-CM

## 2019-12-23 DIAGNOSIS — E1169 Type 2 diabetes mellitus with other specified complication: Secondary | ICD-10-CM

## 2019-12-23 DIAGNOSIS — B3731 Acute candidiasis of vulva and vagina: Secondary | ICD-10-CM

## 2019-12-23 MED ORDER — FLUCONAZOLE 150 MG PO TABS: 150.0000 mg | ORAL_TABLET | Freq: Once | ORAL | 0 refills | Status: AC

## 2019-12-23 NOTE — Progress Notes (Signed)
Subjective:    Patient ID: Diana Deleon, female    DOB: 13-Aug-1961, 59 y.o.   MRN: [id number]  This visit occurred during the SARS-CoV-2 public health emergency.  Safety protocols were in place, including screening questions prior to the visit, additional usage of staff PPE, and extensive cleaning of exam room while observing appropriate contact time as indicated for disinfecting solutions.    HPI Pt presents for diabetes   Wt Readings from Last 3 Encounters:  12/23/19 217 lb 7 oz (98.6 kg)  07/17/19 219 lb 1.6 oz (99.4 kg)  02/03/19 218 lb 5 oz (99 kg)  wt is stable  Eating the best she can - diet got better in general  Exercise - she got a recumbent bike - uses it 2 times per week  37.03 kg/m   Was caring for her mother-she passed away  Is doing fair with grief   Will have more time to exercise now      DM2 Lab Results  Component Value Date   HGBA1C 6.8 (H) 02/03/2019  intolerant of metformin  Takes glipizide xl 5 mg daily  On arb On statin  DM eye exam 9/20 up to date   Her blood glucose went up around jan 1 to the 400s She noticed at that time her feet felt a little numb and hands felt tingling  She generally does not check her blood glucose levels  She took an extra glipzide 2 days  Not under 200s since then   Good diet overall  Some sweets over the holidays   More yeast infections    Hyperlipidemia Lab Results  Component Value Date   CHOL 182 02/03/2019   HDL 53.20 02/03/2019   LDLCALC 108 (H) 02/03/2019   TRIG 104.0 02/03/2019   CHOLHDL 3 02/03/2019   Taking crestor -tolerating well  Diet -overall good   bp is stable today  No cp or palpitations or headaches or edema  No side effects to medicines  BP Readings from Last 3 Encounters:  12/23/19 136/78  07/17/19 (!) 154/86  02/03/19 136/78    Losartan hct Metoprolol xl 50 mg   Pulse Readings from Last 3 Encounters:  12/23/19 80  07/17/19 80  02/03/19 71   Patient Active Problem List   Diagnosis Date Noted  . Yeast vaginitis 12/23/2019  . Hyperlipidemia associated with type 2 diabetes mellitus ([place]) 11/02/2018  . Low back pain 03/21/2018  . Osteopenia 01/05/2018  . Aromatase inhibitor use 12/25/2017  . Psoriasis 11/02/2016  . Colon cancer screening 11/02/2016  . Breast cancer of upper-outer quadrant of right female breast ([place]) 10/25/2014  . Controlled type 2 diabetes mellitus without complication, without long-term current use of insulin ([place]) 07/14/2013  . Routine general medical examination at a health care facility 10/12/2012  . Hypothyroidism 06/03/2012  . Overactive bladder 05/27/2012  . HSV infection 07/07/2010  . VERTIGO 08/01/2009  . URINARY URGENCY 07/15/2009  . LOW BACK PAIN, CHRONIC 05/17/2008  . HEADACHE, CHRONIC 05/17/2008  . Essential hypertension 02/12/2008   Past Medical History:  Diagnosis Date  . Anemia    "off and on"  . Arthritis    "right toes" (03/24/2015)  . Cancer of right breast ([place]) 2015  . Cyst of cystic duct    chest- central location, taking Cephalexin currently  . Diabetes mellitus without complication ([place])   . Fibroids    hyst. 2006  . GERD (gastroesophageal reflux disease)   . History of abnormal Pap smear  has had leep in past  . History of cold sores    has used valtrex in past  . History of recurrent UTIs   . Hypertension   . Hypothyroidism   . Intermittent vertigo   . Kidney stones   . Seborrheic dermatitis    local on R scalp and in ear (failed mult meds- uses steroid spray)  . Thyroid disease    Past Surgical History:  Procedure Laterality Date  . BREAST RECONSTRUCTION WITH PLACEMENT OF TISSUE EXPANDER AND FLEX HD (ACELLULAR HYDRATED DERMIS) Right 11/25/2014   Procedure: RIGHT BREAST RECONSTRUCTION WITH PLACEMENT OF TISSUE EXPANDER AND USE OF ACELLULAR DERMRAL MATRIX ;  Surgeon: Crissie Reese, MD;  Location: [facility];  Service: Plastics;  Laterality: Right;  . BREAST REDUCTION SURGERY Left 03/24/2015   Procedure:  LEFT BREAST REDUCTION  (BREAST);  Surgeon: Crissie Reese, MD;  Location: [facility];  Service: Plastics;  Laterality: Left;  . COLONOSCOPY    . COLONOSCOPY WITH PROPOFOL N/A 12/28/2016   Procedure: COLONOSCOPY WITH PROPOFOL;  Surgeon: Jonathon Bellows, MD;  Location: [facility];  Service: Endoscopy;  Laterality: N/A;  . LAPAROSCOPIC CHOLECYSTECTOMY  2003  . LITHOTRIPSY  ~ 2007  . REMOVAL OF TISSUE EXPANDER Right 03/24/2015  . REMOVAL OF TISSUE EXPANDER AND PLACEMENT OF IMPLANT Right 03/24/2015   w/delayed Architect  . REMOVAL OF TISSUE EXPANDER AND PLACEMENT OF IMPLANT Right 03/24/2015   Procedure: REMOVAL OF RIGHT BREAST TISSUE EXPANDER AND DELAYED BREAST RECONSTRUCTION WITH PLACEMENT OF IMPLANT;  Surgeon: Crissie Reese, MD;  Location: [facility];  Service: Plastics;  Laterality: Right;  . SIMPLE MASTECTOMY WITH AXILLARY SENTINEL NODE BIOPSY Right 11/25/2014   Procedure: RIGHT TOTAL  MASTECTOMY WITH AXILLARY SENTINEL NODE BIOPSY ;  Surgeon: Excell Seltzer, MD;  Location: [facility];  Service: General;  Laterality: Right;  . TUBAL LIGATION    . VAGINAL DELIVERY  x3  . VAGINAL HYSTERECTOMY  2006   "fibroids"   Social History   Tobacco Use  . Smoking status: Never Smoker  . Smokeless tobacco: Never Used  Substance Use Topics  . Alcohol use: No    Alcohol/week: 0.0 standard drinks  . Drug use: No   Family History  Problem Relation Age of Onset  . Alcohol abuse Father   . COPD Father   . Hypertension Father   . Hypertension Mother   . Parkinson's disease Mother    Allergies  Allergen Reactions  . Metformin And Related     Diarrhea (even XR)   . Norvasc [Amlodipine Besylate] Swelling   Current Outpatient Medications on File Prior to Visit  Medication Sig Dispense Refill  . alendronate (FOSAMAX) 70 MG tablet Take 1 tablet (70 mg total) by mouth every 7 (seven) days. Take with a full glass of water on an empty stomach. 12 tablet 3  . anastrozole (ARIMIDEX) 1 MG tablet TAKE 1 TABLET(1 MG) BY MOUTH  DAILY 90 tablet 3  . aspirin EC 81 MG tablet Take 81 mg by mouth daily.    [name] Kitchen augmented betamethasone dipropionate (DIPROLENE-AF) 0.05 % ointment Apply 1 application topically daily as needed.     . Calcium Carb-Cholecalciferol (CALCIUM 600/VITAMIN D3) 600-800 MG-UNIT TABS Take 2 tablets by mouth daily.    [name] Kitchen glipiZIDE (GLUCOTROL XL) 5 MG 24 hr tablet Take 1 tablet (5 mg total) by mouth daily with breakfast. 90 tablet 3  . levothyroxine (SYNTHROID, LEVOTHROID) 50 MCG tablet TAKE 1 TABLET(50 MCG) BY MOUTH DAILY 90 tablet 3  . losartan-hydrochlorothiazide (HYZAAR) 100-25  MG tablet Take 1 tablet by mouth daily. 90 tablet 3  . meloxicam (MOBIC) 7.5 MG tablet Take 7.5 mg by mouth daily.    . metoprolol succinate (TOPROL-XL) 50 MG 24 hr tablet Take 1 tablet (50 mg total) by mouth daily. Take with or immediately following a meal. 90 tablet 3  . omeprazole (PRILOSEC) 20 MG capsule TAKE 1 CAPSULE BY MOUTH DAILY BEFORE BREAKFAST 90 capsule 3  . ONETOUCH DELICA LANCETS FINE MISC Use to check blood sugar once daily and as needed (dx. E11.9) 100 each 1  . ONETOUCH VERIO test strip USE TO CHECK BLOOD SUGAR ONCE DAILY AND AS NEEDED 100 each 2  . rosuvastatin (CRESTOR) 5 MG tablet Take 1 tablet (5 mg total) by mouth daily. 30 tablet 11  . valACYclovir (VALTREX) 500 MG tablet Take 1 tablet (500 mg total) by mouth 2 (two) times daily. For 5 days for an outbreak 10 tablet 3  . venlafaxine XR (EFFEXOR-XR) 75 MG 24 hr capsule TAKE 1 CAPSULE(75 MG) BY MOUTH DAILY WITH BREAKFAST 90 capsule 1   No current facility-administered medications on file prior to visit.     Review of Systems  Constitutional: Positive for fatigue. Negative for activity change, appetite change, fever and unexpected weight change.  HENT: Negative for congestion, ear pain, rhinorrhea, sinus pressure and sore throat.   Eyes: Negative for pain, redness and visual disturbance.  Respiratory: Negative for cough, shortness of breath and wheezing.     Cardiovascular: Negative for chest pain and palpitations.  Gastrointestinal: Negative for abdominal pain, blood in stool, constipation and diarrhea.  Endocrine: Negative for polydipsia and polyuria.  Genitourinary: Negative for dysuria, frequency and urgency.  Musculoskeletal: Negative for arthralgias, back pain and myalgias.  Skin: Negative for pallor and rash.  Allergic/Immunologic: Negative for environmental allergies.  Neurological: Negative for dizziness, syncope and headaches.       Had tingling in fingers/toes when glucose was very high  Hematological: Negative for adenopathy. Does not bruise/bleed easily.  Psychiatric/Behavioral: Negative for decreased concentration and dysphoric mood. The patient is not nervous/anxious.        Stressors  Grief-lost her mother        Objective:   Physical Exam Constitutional:      General: She is not in acute distress.    Appearance: She is well-developed. She is obese. She is not ill-appearing or diaphoretic.  HENT:     Head: Normocephalic and atraumatic.     Mouth/Throat:     Mouth: Mucous membranes are moist.  Eyes:     General: No scleral icterus.    Conjunctiva/sclera: Conjunctivae normal.     Pupils: Pupils are equal, round, and reactive to light.  Neck:     Thyroid: No thyromegaly.     Vascular: No carotid bruit or JVD.  Cardiovascular:     Rate and Rhythm: Normal rate and regular rhythm.     Heart sounds: Normal heart sounds. No gallop.   Pulmonary:     Effort: Pulmonary effort is normal. No respiratory distress.     Breath sounds: Normal breath sounds. No wheezing or rales.  Abdominal:     General: Bowel sounds are normal. There is no distension or abdominal bruit.     Palpations: Abdomen is soft. There is no mass.     Tenderness: There is no abdominal tenderness. There is no guarding or rebound.  Musculoskeletal:     Cervical back: Normal range of motion and neck supple.     Right  lower leg: No edema.     Left lower  leg: No edema.  Lymphadenopathy:     Cervical: No cervical adenopathy.  Skin:    General: Skin is warm and dry.     Coloration: Skin is not pale.     Findings: No erythema or rash.  Neurological:     Mental Status: She is alert.     Sensory: No sensory deficit.     Coordination: Coordination normal.     Deep Tendon Reflexes: Reflexes are normal and symmetric.  Psychiatric:        Mood and Affect: Mood normal.     Comments: Pleasant            Assessment & Plan:   Problem List Items Addressed This Visit      Cardiovascular and Mediastinum   Essential hypertension    bp in fair control at this time  BP Readings from Last 1 Encounters:  12/23/19 136/78   No changes needed Most recent labs reviewed and labs ordered today Disc lifstyle change with low sodium diet and exercise        Relevant Orders   CBC w/Diff   Comprehensive metabolic panel   Lipid panel   TSH     Endocrine   Hypothyroidism    TSH drawn today No clinical changes       Controlled type 2 diabetes mellitus without complication, without long-term current use of insulin ([place]) - Primary    Unexpectedly pt notes blood glucose levels have spiked despite prev good control and no significant change in diet and exercise  Compliant with medication but not glucose testing  Interested in continuous monitor if ins would cover-she will check on this  Labs today  Intol of metformin (even xr )  Will likely inc glipizide dose and add another agent  On arb and statin  Eye exam utd  Good foot care  F/u planned next mo      Relevant Orders   Hemoglobin A1c   Hyperlipidemia associated with type 2 diabetes mellitus ([place])    Lipid panel today  Disc goals for lipids and reasons to control them Rev last labs with pt Rev low sat fat diet in detail  On crestor       Relevant Orders   Comprehensive metabolic panel   Lipid panel     Genitourinary   Yeast vaginitis    More frequent with high glucose  readings  Needs better diabetes control  Diflucan sent       Relevant Medications   fluconazole (DIFLUCAN) 150 MG tablet     Other   Breast cancer of upper-outer quadrant of right female breast ([place])    Clinically stable  On anastrozole  Under care of oncology yearly      Relevant Medications   fluconazole (DIFLUCAN) 150 MG tablet    Other Visit Diagnoses    Need for influenza vaccination       Relevant Orders   Flu Vaccine QUAD 6+ mos PF IM (Fluarix Quad PF) (Completed)

## 2019-12-23 NOTE — Assessment & Plan Note (Signed)
Clinically stable  On anastrozole  Under care of oncology yearly

## 2019-12-23 NOTE — Assessment & Plan Note (Addendum)
bp in fair control at this time  BP Readings from Last 1 Encounters:  12/23/19 136/78   No changes needed Most recent labs reviewed and labs ordered today Disc lifstyle change with low sodium diet and exercise

## 2019-12-23 NOTE — Assessment & Plan Note (Signed)
Unexpectedly pt notes blood glucose levels have spiked despite prev good control and no significant change in diet and exercise  Compliant with medication but not glucose testing  Interested in continuous monitor if ins would cover-she will check on this  Labs today  Intol of metformin (even xr )  Will likely inc glipizide dose and add another agent  On arb and statin  Eye exam utd  Good foot care  F/u planned next mo

## 2019-12-23 NOTE — Assessment & Plan Note (Signed)
More frequent with high glucose readings  Needs better diabetes control  Diflucan sent

## 2019-12-23 NOTE — Assessment & Plan Note (Signed)
Lipid panel today  Disc goals for lipids and reasons to control them Rev last labs with pt Rev low sat fat diet in detail  On crestor

## 2019-12-23 NOTE — Assessment & Plan Note (Signed)
TSH drawn today No clinical changes

## 2019-12-23 NOTE — Patient Instructions (Addendum)
Call your insurance to see if one of the glucose monitoring systems is covered (libre or dexcom)  If affordable- please let me know exactly what px they need   Lets see how your labs look - and then make a plan for blood sugar  Stick to a diabetic diet  Keep drinking lots of water  Increase exercise -work up to 5 days or more per week   Labs today (these will be your pre physical labs as well   Flu shot today

## 2019-12-24 ENCOUNTER — Encounter: Payer: Self-pay | Admitting: Hematology and Oncology

## 2019-12-24 ENCOUNTER — Telehealth: Payer: Self-pay

## 2019-12-24 ENCOUNTER — Encounter: Payer: Self-pay | Admitting: Family Medicine

## 2019-12-24 LAB — LIPID PANEL
Cholesterol: 153 mg/dL (ref 0–200)
HDL: 55.9 mg/dL (ref >39.00)
LDL Cholesterol: 67 mg/dL (ref 0–99)
NonHDL: 97.32
Total CHOL/HDL Ratio: 3
Triglycerides: 153 mg/dL — ABNORMAL HIGH (ref 0.0–149.0)
VLDL: 30.6 mg/dL (ref 0.0–40.0)

## 2019-12-24 LAB — CBC WITH DIFFERENTIAL/PLATELET
Basophils Absolute: 0.1 10*3/uL (ref 0.0–0.1)
Basophils Relative: 1 % (ref 0.0–3.0)
Eosinophils Absolute: 0.1 10*3/uL (ref 0.0–0.7)
Eosinophils Relative: 2.4 % (ref 0.0–5.0)
HCT: 44.5 % (ref 36.0–46.0)
Hemoglobin: 15 g/dL (ref 12.0–15.0)
Lymphocytes Relative: 26.6 % (ref 12.0–46.0)
Lymphs Abs: 1.5 10*3/uL (ref 0.7–4.0)
MCHC: 33.7 g/dL (ref 30.0–36.0)
MCV: 90.5 fl (ref 78.0–100.0)
Monocytes Absolute: 0.5 10*3/uL (ref 0.1–1.0)
Monocytes Relative: 8.9 % (ref 3.0–12.0)
Neutro Abs: 3.4 10*3/uL (ref 1.4–7.7)
Neutrophils Relative %: 61.1 % (ref 43.0–77.0)
Platelets: 229 10*3/uL (ref 150.0–400.0)
RBC: 4.92 Mil/uL (ref 3.87–5.11)
RDW: 13.7 % (ref 11.5–15.5)
WBC: 5.5 10*3/uL (ref 4.0–10.5)

## 2019-12-24 LAB — HEMOGLOBIN A1C: Hgb A1c MFr Bld: 11.3 % — ABNORMAL HIGH (ref 4.6–6.5)

## 2019-12-24 LAB — COMPREHENSIVE METABOLIC PANEL
ALT: 34 U/L (ref 0–35)
AST: 25 U/L (ref 0–37)
Albumin: 4.2 g/dL (ref 3.5–5.2)
Alkaline Phosphatase: 120 U/L — ABNORMAL HIGH (ref 39–117)
BUN: 15 mg/dL (ref 6–23)
CO2: 30 mEq/L (ref 19–32)
Calcium: 9.3 mg/dL (ref 8.4–10.5)
Chloride: 92 mEq/L — ABNORMAL LOW (ref 96–112)
Creatinine, Ser: 1.19 mg/dL (ref 0.40–1.20)
GFR: 46.74 mL/min — ABNORMAL LOW (ref >60.00)
Glucose, Bld: 528 mg/dL (ref 70–99)
Potassium: 3.5 mEq/L (ref 3.5–5.1)
Sodium: 132 mEq/L — ABNORMAL LOW (ref 135–145)
Total Bilirubin: 0.6 mg/dL (ref 0.2–1.2)
Total Protein: 7.2 g/dL (ref 6.0–8.3)

## 2019-12-24 LAB — TSH: TSH: 2.3 u[IU]/mL (ref 0.35–4.50)

## 2019-12-24 MED ORDER — GLIPIZIDE ER 10 MG PO TB24: 10.0000 mg | ORAL_TABLET | Freq: Every day | ORAL | 3 refills | Status: DC

## 2019-12-24 MED ORDER — GLIPIZIDE ER 5 MG PO TB24: 10.0000 mg | ORAL_TABLET | Freq: Every day | ORAL | Status: DC

## 2019-12-24 NOTE — Telephone Encounter (Signed)
Patient advised of everything. Patient will call back to schedule 2 weeks appointment or try to schedule through mychart. Patient asked for information from Dr Diana Deleon to be sent to her through [place] since she was driving when we talked. This was done.

## 2019-12-24 NOTE — Telephone Encounter (Signed)
[facility] called in a critical result at 1410 Labs were drawn yesterday afternoon  Glucose 528

## 2019-12-24 NOTE — Addendum Note (Signed)
Addended by: Tonia Ghent on: 12/24/2019 02:46 PM   Modules accepted: Orders

## 2019-12-24 NOTE — Telephone Encounter (Signed)
Patient advised. Patient does not have her meter with her at the time but she will check it when she gets home and it will be reported for tomorrow.  Patient will double the Glipizide and wait for further instructions from Dr. Glori Bickers.

## 2019-12-24 NOTE — Telephone Encounter (Signed)
Thanks for the update- please keep me posted re: glucose and how she feels  Increase glipizide xl to 10 mg daily (take 2 of what she has once daily and I will send a new px to her pharmacy for the 10 mg pills)  Please ask her to call her ins and ask if the generic of Januvia is covered and affordable - if so I will start her on 100 mg once daily  F/u with me in about 2 weeks with some blood glucose records if possible  I will work on the px for continuous glucose monitor tomorrow when I am back in the office (I rec her email about it)   Watch diet as we discussed

## 2019-12-24 NOTE — Telephone Encounter (Addendum)
Call pt.  Sugar 528.  Check on patient.  If feeling poorly at all, then to ER.  If feeling well, then have her recheck sugar now and update me.  Have her double her glipizide and drink enough water to keep urine clear.  Thanks.  Routed to PCP as FYI.  She'll likely need f/u in the next few days, assuming she doesn't need to go to ER.

## 2019-12-24 NOTE — Telephone Encounter (Signed)
Called pt and no answer and pt's VM box wasn't set up so couldn't leave a VM

## 2019-12-25 NOTE — Telephone Encounter (Signed)
Noted. Thanks. I'll defer to PCP.

## 2019-12-31 ENCOUNTER — Telehealth: Payer: Self-pay | Admitting: Family Medicine

## 2019-12-31 NOTE — Telephone Encounter (Signed)
Order faxed.

## 2019-12-31 NOTE — Telephone Encounter (Signed)
Done and in IN box 

## 2019-12-31 NOTE — Telephone Encounter (Signed)
[facility] called in regards to a scrip they received for the patient's Diabetic Supplies They stated that there was not a specific brand on the sprict they received. They would like to know if a new script could be sent over, and to make sure it has either freestyle libra 14 day or dexcom G6 on there  They would like it faxed over to them  FAX_ [phone]

## 2020-01-11 ENCOUNTER — Encounter: Payer: Self-pay | Admitting: Family Medicine

## 2020-01-11 ENCOUNTER — Telehealth: Payer: Self-pay

## 2020-01-11 ENCOUNTER — Ambulatory Visit: Payer: Managed Care, Other (non HMO) | Admitting: Family Medicine

## 2020-01-11 ENCOUNTER — Other Ambulatory Visit: Payer: Self-pay

## 2020-01-11 VITALS — BP 136/84 | HR 70 | Temp 96.9°F | Ht 64.25 in | Wt 214.0 lb

## 2020-01-11 DIAGNOSIS — E1165 Type 2 diabetes mellitus with hyperglycemia: Secondary | ICD-10-CM | POA: Diagnosis not present

## 2020-01-11 DIAGNOSIS — Z6836 Body mass index (BMI) 36.0-36.9, adult: Secondary | ICD-10-CM

## 2020-01-11 DIAGNOSIS — E785 Hyperlipidemia, unspecified: Secondary | ICD-10-CM

## 2020-01-11 DIAGNOSIS — I10 Essential (primary) hypertension: Secondary | ICD-10-CM | POA: Diagnosis not present

## 2020-01-11 DIAGNOSIS — E1169 Type 2 diabetes mellitus with other specified complication: Secondary | ICD-10-CM | POA: Diagnosis not present

## 2020-01-11 DIAGNOSIS — Z794 Long term (current) use of insulin: Secondary | ICD-10-CM

## 2020-01-11 MED ORDER — INSULIN DETEMIR 100 UNIT/ML FLEXPEN: 20.0000 [IU] | PEN_INJECTOR | Freq: Every day | SUBCUTANEOUS | 1 refills | Status: DC

## 2020-01-11 NOTE — Progress Notes (Signed)
Subjective:    Patient ID: Diana Deleon, female    DOB: [birth year], 59 y.o.   MRN: [id number]  This visit occurred during the SARS-CoV-2 public health emergency.  Safety protocols were in place, including screening questions prior to the visit, additional usage of staff PPE, and extensive cleaning of exam room while observing appropriate contact time as indicated for disinfecting solutions.    HPI  Pt presents for f/u of chronic health issues/ diabetes  Wt Readings from Last 3 Encounters:  01/11/20 214 lb (97.1 kg)  12/23/19 217 lb 7 oz (98.6 kg)  07/17/19 219 lb 1.6 oz (99.4 kg)  down 3 lb  36.45 kg/m     Working hard on diet and exercise -still  Now she has just a little more time   bp is stable today  No cp or palpitations or headaches or edema  No side effects to medicines  BP Readings from Last 3 Encounters:  01/11/20 136/84  12/23/19 136/78  07/17/19 (!) 154/86     DM2 Lab Results  Component Value Date   HGBA1C 11.3 (H) 12/23/2019   This was much higher than anticipated  Ordered a continuous monitor  Pt is intolerant of metformin (even XR) Increased her glipizide xl to 10 mg   On arb On statin   Found out detemir insulin is covered  Januvia is not covered well / ? Possibly prior auth   Glucose readings have generally been in 300s  This am 290   Exercise- riding an exercise bike 2-3 days per week - will try to push it up to 5 days  Rides for about 30 minutes   Sweets - every 2 weeks  Drinks OJ - 8 oz at a time  Bread - some (does not eat sandwiches)  Pasta- not often Rice -not often  Cereal- oatmeal (not instant)  Snack foods-none  White potatoes -- more often (can try sweet potatoes)   Has done diabetic teaching   opth exam 9/20 -no retinopathy  Has some blurry vision    More tired with arimidex  Only has a few more years   Hyperlipidemia Lab Results  Component Value Date   CHOL 153 12/23/2019   HDL 55.90 12/23/2019   LDLCALC 67  12/23/2019   TRIG 153.0 (H) 12/23/2019   CHOLHDL 3 12/23/2019  well controlled by statin    Patient Active Problem List   Diagnosis Date Noted  . Yeast vaginitis 12/23/2019  . Hyperlipidemia associated with type 2 diabetes mellitus ([place]) 11/02/2018  . Low back pain 03/21/2018  . Osteopenia 01/05/2018  . Aromatase inhibitor use 12/25/2017  . Psoriasis 11/02/2016  . Colon cancer screening 11/02/2016  . Breast cancer of upper-outer quadrant of right female breast ([place]) 10/25/2014  . Controlled type 2 diabetes mellitus without complication, without long-term current use of insulin ([place]) 07/14/2013  . Routine general medical examination at a health care facility 10/12/2012  . Hypothyroidism 06/03/2012  . Overactive bladder 05/27/2012  . HSV infection 07/07/2010  . VERTIGO 08/01/2009  . URINARY URGENCY 07/15/2009  . LOW BACK PAIN, CHRONIC 05/17/2008  . HEADACHE, CHRONIC 05/17/2008  . Essential hypertension 02/12/2008   Past Medical History:  Diagnosis Date  . Anemia    "off and on"  . Arthritis    "right toes" (03/24/2015)  . Cancer of right breast ([place]) 2015  . Cyst of cystic duct    chest- central location, taking Cephalexin currently  . Diabetes mellitus without complication ([place])   .  Fibroids    hyst. 2006  . GERD (gastroesophageal reflux disease)   . History of abnormal Pap smear    has had leep in past  . History of cold sores    has used valtrex in past  . History of recurrent UTIs   . Hypertension   . Hypothyroidism   . Intermittent vertigo   . Kidney stones   . Seborrheic dermatitis    local on R scalp and in ear (failed mult meds- uses steroid spray)  . Thyroid disease    Past Surgical History:  Procedure Laterality Date  . BREAST RECONSTRUCTION WITH PLACEMENT OF TISSUE EXPANDER AND FLEX HD (ACELLULAR HYDRATED DERMIS) Right 11/25/2014   Procedure: RIGHT BREAST RECONSTRUCTION WITH PLACEMENT OF TISSUE EXPANDER AND USE OF ACELLULAR DERMRAL MATRIX ;  Surgeon:  Crissie Reese, MD;  Location: [facility];  Service: Plastics;  Laterality: Right;  . BREAST REDUCTION SURGERY Left 03/24/2015   Procedure: LEFT BREAST REDUCTION  (BREAST);  Surgeon: Crissie Reese, MD;  Location: [facility];  Service: Plastics;  Laterality: Left;  . COLONOSCOPY    . COLONOSCOPY WITH PROPOFOL N/A 12/28/2016   Procedure: COLONOSCOPY WITH PROPOFOL;  Surgeon: Jonathon Bellows, MD;  Location: [facility];  Service: Endoscopy;  Laterality: N/A;  . LAPAROSCOPIC CHOLECYSTECTOMY  2003  . LITHOTRIPSY  ~ 2007  . REMOVAL OF TISSUE EXPANDER Right 03/24/2015  . REMOVAL OF TISSUE EXPANDER AND PLACEMENT OF IMPLANT Right 03/24/2015   w/delayed Architect  . REMOVAL OF TISSUE EXPANDER AND PLACEMENT OF IMPLANT Right 03/24/2015   Procedure: REMOVAL OF RIGHT BREAST TISSUE EXPANDER AND DELAYED BREAST RECONSTRUCTION WITH PLACEMENT OF IMPLANT;  Surgeon: Crissie Reese, MD;  Location: [facility];  Service: Plastics;  Laterality: Right;  . SIMPLE MASTECTOMY WITH AXILLARY SENTINEL NODE BIOPSY Right 11/25/2014   Procedure: RIGHT TOTAL  MASTECTOMY WITH AXILLARY SENTINEL NODE BIOPSY ;  Surgeon: Excell Seltzer, MD;  Location: [facility];  Service: General;  Laterality: Right;  . TUBAL LIGATION    . VAGINAL DELIVERY  x3  . VAGINAL HYSTERECTOMY  2006   "fibroids"   Social History   Tobacco Use  . Smoking status: Never Smoker  . Smokeless tobacco: Never Used  Substance Use Topics  . Alcohol use: No    Alcohol/week: 0.0 standard drinks  . Drug use: No   Family History  Problem Relation Age of Onset  . Alcohol abuse Father   . COPD Father   . Hypertension Father   . Hypertension Mother   . Parkinson's disease Mother    Allergies  Allergen Reactions  . Metformin And Related     Diarrhea (even XR)   . Norvasc [Amlodipine Besylate] Swelling   Current Outpatient Medications on File Prior to Visit  Medication Sig Dispense Refill  . alendronate (FOSAMAX) 70 MG tablet Take 1 tablet (70 mg total) by mouth every 7 (seven) days.  Take with a full glass of water on an empty stomach. 12 tablet 3  . anastrozole (ARIMIDEX) 1 MG tablet TAKE 1 TABLET(1 MG) BY MOUTH DAILY 90 tablet 3  . aspirin EC 81 MG tablet Take 81 mg by mouth daily.    [name] Kitchen augmented betamethasone dipropionate (DIPROLENE-AF) 0.05 % ointment Apply 1 application topically daily as needed.     . Calcium Carb-Cholecalciferol (CALCIUM 600/VITAMIN D3) 600-800 MG-UNIT TABS Take 2 tablets by mouth daily.    [name] Kitchen glipiZIDE (GLUCOTROL XL) 10 MG 24 hr tablet Take 1 tablet (10 mg total) by mouth daily with breakfast. 90 tablet 3  .  levothyroxine (SYNTHROID, LEVOTHROID) 50 MCG tablet TAKE 1 TABLET(50 MCG) BY MOUTH DAILY 90 tablet 3  . losartan-hydrochlorothiazide (HYZAAR) 100-25 MG tablet Take 1 tablet by mouth daily. 90 tablet 3  . meloxicam (MOBIC) 7.5 MG tablet Take 7.5 mg by mouth daily.    . metoprolol succinate (TOPROL-XL) 50 MG 24 hr tablet Take 1 tablet (50 mg total) by mouth daily. Take with or immediately following a meal. 90 tablet 3  . omeprazole (PRILOSEC) 20 MG capsule TAKE 1 CAPSULE BY MOUTH DAILY BEFORE BREAKFAST 90 capsule 3  . ONETOUCH DELICA LANCETS FINE MISC Use to check blood sugar once daily and as needed (dx. E11.9) 100 each 1  . ONETOUCH VERIO test strip USE TO CHECK BLOOD SUGAR ONCE DAILY AND AS NEEDED 100 each 2  . rosuvastatin (CRESTOR) 5 MG tablet Take 1 tablet (5 mg total) by mouth daily. 30 tablet 11  . valACYclovir (VALTREX) 500 MG tablet Take 1 tablet (500 mg total) by mouth 2 (two) times daily. For 5 days for an outbreak 10 tablet 3  . venlafaxine XR (EFFEXOR-XR) 75 MG 24 hr capsule TAKE 1 CAPSULE(75 MG) BY MOUTH DAILY WITH BREAKFAST 90 capsule 1   No current facility-administered medications on file prior to visit.    Review of Systems  Constitutional: Negative for activity change, appetite change, fatigue, fever and unexpected weight change.  HENT: Negative for congestion, ear pain, rhinorrhea, sinus pressure and sore throat.   Eyes:  Positive for visual disturbance. Negative for pain and redness.  Respiratory: Negative for cough, shortness of breath and wheezing.   Cardiovascular: Negative for chest pain and palpitations.  Gastrointestinal: Negative for abdominal pain, blood in stool, constipation and diarrhea.  Endocrine: Negative for polydipsia, polyphagia and polyuria.  Genitourinary: Negative for dysuria, frequency and urgency.  Musculoskeletal: Negative for arthralgias, back pain and myalgias.  Skin: Negative for pallor and rash.  Allergic/Immunologic: Negative for environmental allergies.  Neurological: Negative for dizziness, syncope, numbness and headaches.  Hematological: Negative for adenopathy. Does not bruise/bleed easily.  Psychiatric/Behavioral: Negative for decreased concentration and dysphoric mood. The patient is not nervous/anxious.        Objective:   Physical Exam Constitutional:      General: She is not in acute distress.    Appearance: Normal appearance. She is well-developed. She is obese. She is not ill-appearing or diaphoretic.  HENT:     Head: Normocephalic and atraumatic.     Mouth/Throat:     Mouth: Mucous membranes are moist.  Eyes:     General: No scleral icterus.    Conjunctiva/sclera: Conjunctivae normal.     Pupils: Pupils are equal, round, and reactive to light.  Neck:     Thyroid: No thyromegaly.     Vascular: No carotid bruit or JVD.  Cardiovascular:     Rate and Rhythm: Normal rate and regular rhythm.     Pulses: Normal pulses.     Heart sounds: Normal heart sounds. No gallop.   Pulmonary:     Effort: Pulmonary effort is normal. No respiratory distress.     Breath sounds: Normal breath sounds. No wheezing or rales.  Abdominal:     General: Bowel sounds are normal. There is no distension or abdominal bruit.     Palpations: Abdomen is soft. There is no mass.     Tenderness: There is no abdominal tenderness.     Hernia: No hernia is present.  Musculoskeletal:      Cervical back: Normal range of motion and  neck supple. No tenderness.     Right lower leg: No edema.     Left lower leg: No edema.  Lymphadenopathy:     Cervical: No cervical adenopathy.  Skin:    General: Skin is warm and dry.     Findings: No erythema or rash.  Neurological:     Mental Status: She is alert.     Cranial Nerves: No cranial nerve deficit.     Sensory: No sensory deficit.     Deep Tendon Reflexes: Reflexes are normal and symmetric. Reflexes normal.  Psychiatric:        Mood and Affect: Mood normal.           Assessment & Plan:   Problem List Items Addressed This Visit      Cardiovascular and Mediastinum   Essential hypertension    bp in fair control at this time  BP Readings from Last 1 Encounters:  01/11/20 136/84   No changes needed Most recent labs reviewed  Disc lifstyle change with low sodium diet and exercise          Endocrine   Type 2 diabetes mellitus with hyperglycemia (HCC) - Primary    Lab Results  Component Value Date   HGBA1C 11.3 (H) 12/23/2019   this was a big surprise  Glucose readings remain high despite good diet  Enc to inc exercise to 5 d per week  Intolerant of metformin  No hypoglycemia with glipizide Will add detemir insulin at 10 u and likely inc to 20 in a week  Disc poss side effects including low glucose readings  Will update in a week- and titrate up as tolerated  Pt will be getting a continuous glucose monitor soon which will be helpful for monitoring as well      Relevant Medications   Insulin Detemir (LEVEMIR) 100 UNIT/ML Pen   Hyperlipidemia associated with type 2 diabetes mellitus ([place])    Well controlled with crestor and diet Disc goals for lipids and reasons to control them Rev last labs with pt Rev low sat fat diet in detail       Relevant Medications   Insulin Detemir (LEVEMIR) 100 UNIT/ML Pen     Other   Class 2 severe obesity due to excess calories with serious comorbidity and body mass index  (BMI) of 36.0 to 36.9 in adult [facility])    Discussed how this problem influences overall health and the risks it imposes  Reviewed plan for weight loss with lower calorie diet (via better food choices and also portion control or program like weight watchers) and exercise building up to or more than 30 minutes 5 days per week including some aerobic activity   Wt loss would no doubt help diabetes       Relevant Medications   Insulin Detemir (LEVEMIR) 100 UNIT/ML Pen

## 2020-01-11 NOTE — Telephone Encounter (Signed)
[facility] Night - Client Nonclinical Telephone Record AccessNurse Client [facility] Night - Client Client Site [place] Physician [place], Roque Lias - MD Contact Type Call Who Is Calling Patient / Member / Family / Caregiver Caller Name [place] Phone Number NA Patient Name Diana Deleon Patient DOB 05/14/1961 Call Type Message Only Information Provided Reason for Call Request for General Office Information Initial Comment Caller stated she has a 8 AM appt and she will be there right at 8. Additional Comment Caller was informed message will be sent. Disp. Time Disposition Final User 01/11/2020 7:57:27 AM General Information Provided Yes Money, Hildred Alamin Call Closed By: Eugenie Birks Transaction Date/Time: 01/11/2020 7:55:15 AM (ET)

## 2020-01-11 NOTE — Assessment & Plan Note (Signed)
Discussed how this problem influences overall health and the risks it imposes  Reviewed plan for weight loss with lower calorie diet (via better food choices and also portion control or program like weight watchers) and exercise building up to or more than 30 minutes 5 days per week including some aerobic activity   Wt loss would no doubt help diabetes

## 2020-01-11 NOTE — Assessment & Plan Note (Addendum)
Well controlled with crestor and diet Disc goals for lipids and reasons to control them Rev last labs with pt Rev low sat fat diet in detail

## 2020-01-11 NOTE — Telephone Encounter (Signed)
Pt has already had appt today with Dr Glori Bickers.

## 2020-01-11 NOTE — Assessment & Plan Note (Signed)
bp in fair control at this time  BP Readings from Last 1 Encounters:  01/11/20 136/84   No changes needed Most recent labs reviewed  Disc lifstyle change with low sodium diet and exercise

## 2020-01-11 NOTE — Assessment & Plan Note (Signed)
Lab Results  Component Value Date   HGBA1C 11.3 (H) 12/23/2019   this was a big surprise  Glucose readings remain high despite good diet  Enc to inc exercise to 5 d per week  Intolerant of metformin  No hypoglycemia with glipizide Will add detemir insulin at 10 u and likely inc to 20 in a week  Disc poss side effects including low glucose readings  Will update in a week- and titrate up as tolerated  Pt will be getting a continuous glucose monitor soon which will be helpful for monitoring as well

## 2020-01-11 NOTE — Patient Instructions (Addendum)
Try to increase your exercise to 5 days per week   Try to get most of your carbohydrates from produce (with the exception of white potatoes)  Eat less bread/pasta/rice/snack foods/cereals/sweets and other items from the middle of the grocery store (processed carbs)   Stop the OJ   Start with 10 units of the detemir insulin every evening  After a week- let me know how glucose readings are -we will most likely go up to 20 units daily   Don't skip meals   Take care of yourself

## 2020-01-26 ENCOUNTER — Encounter: Payer: Self-pay | Admitting: Family Medicine

## 2020-01-29 ENCOUNTER — Other Ambulatory Visit: Payer: Self-pay | Admitting: *Deleted

## 2020-01-29 MED ORDER — LEVOTHYROXINE SODIUM 50 MCG PO TABS: ORAL_TABLET | ORAL | 0 refills | Status: DC

## 2020-02-09 ENCOUNTER — Other Ambulatory Visit: Payer: Self-pay

## 2020-02-09 ENCOUNTER — Ambulatory Visit (INDEPENDENT_AMBULATORY_CARE_PROVIDER_SITE_OTHER): Payer: Managed Care, Other (non HMO) | Admitting: Family Medicine

## 2020-02-09 ENCOUNTER — Encounter: Payer: Self-pay | Admitting: Family Medicine

## 2020-02-09 VITALS — BP 132/80 | HR 71 | Temp 96.7°F | Ht 63.75 in | Wt 212.2 lb

## 2020-02-09 DIAGNOSIS — E785 Hyperlipidemia, unspecified: Secondary | ICD-10-CM

## 2020-02-09 DIAGNOSIS — E1169 Type 2 diabetes mellitus with other specified complication: Secondary | ICD-10-CM

## 2020-02-09 DIAGNOSIS — E039 Hypothyroidism, unspecified: Secondary | ICD-10-CM

## 2020-02-09 DIAGNOSIS — Z794 Long term (current) use of insulin: Secondary | ICD-10-CM

## 2020-02-09 DIAGNOSIS — Z0000 Encounter for general adult medical examination without abnormal findings: Secondary | ICD-10-CM

## 2020-02-09 DIAGNOSIS — I10 Essential (primary) hypertension: Secondary | ICD-10-CM | POA: Diagnosis not present

## 2020-02-09 DIAGNOSIS — Z853 Personal history of malignant neoplasm of breast: Secondary | ICD-10-CM

## 2020-02-09 DIAGNOSIS — Z6836 Body mass index (BMI) 36.0-36.9, adult: Secondary | ICD-10-CM

## 2020-02-09 DIAGNOSIS — E1165 Type 2 diabetes mellitus with hyperglycemia: Secondary | ICD-10-CM

## 2020-02-09 DIAGNOSIS — M8589 Other specified disorders of bone density and structure, multiple sites: Secondary | ICD-10-CM

## 2020-02-09 MED ORDER — ROSUVASTATIN CALCIUM 5 MG PO TABS: 5.0000 mg | ORAL_TABLET | Freq: Every day | ORAL | 3 refills | Status: DC

## 2020-02-09 MED ORDER — METOPROLOL SUCCINATE ER 50 MG PO TB24: 50.0000 mg | ORAL_TABLET | Freq: Every day | ORAL | 3 refills | Status: DC

## 2020-02-09 MED ORDER — OMEPRAZOLE 20 MG PO CPDR: DELAYED_RELEASE_CAPSULE | ORAL | 3 refills | Status: DC

## 2020-02-09 MED ORDER — ALENDRONATE SODIUM 70 MG PO TABS: 70.0000 mg | ORAL_TABLET | ORAL | 3 refills | Status: DC

## 2020-02-09 MED ORDER — LEVOTHYROXINE SODIUM 50 MCG PO TABS: ORAL_TABLET | ORAL | 3 refills | Status: DC

## 2020-02-09 MED ORDER — INSULIN DETEMIR 100 UNIT/ML FLEXPEN: 30.0000 [IU] | PEN_INJECTOR | Freq: Every day | SUBCUTANEOUS | 1 refills | Status: DC

## 2020-02-09 MED ORDER — LOSARTAN POTASSIUM-HCTZ 100-25 MG PO TABS: 1.0000 | ORAL_TABLET | Freq: Every day | ORAL | 3 refills | Status: DC

## 2020-02-09 MED ORDER — GLIPIZIDE ER 10 MG PO TB24: 10.0000 mg | ORAL_TABLET | Freq: Every day | ORAL | 3 refills | Status: DC

## 2020-02-09 NOTE — Assessment & Plan Note (Signed)
Lab Results  Component Value Date   HGBA1C 11.3 (H) 12/23/2019   Since then - has dexcom meter (helpful) and taking basal insulin  Recent avg 159 (goes up to 300 after eating) and no lows  Will inc her basal insulin to 30 u daily  Urged to watch for hypoglycemia and update  Continue diabetic diet  Continue exercise and wt loss On arb and statin  Nl foot exam today  Eye exam is utd Plan f/u for May

## 2020-02-09 NOTE — Assessment & Plan Note (Signed)
Hypothyroidism  Pt has no clinical changes No change in energy level/ hair or skin/ edema and no tremor Lab Results  Component Value Date   TSH 2.30 12/23/2019

## 2020-02-09 NOTE — Progress Notes (Signed)
Subjective:    Patient ID: Diana Deleon, female    DOB: 03-14-1961, 59 y.o.   MRN: [id number] This visit occurred during the SARS-CoV-2 public health emergency.  Safety protocols were in place, including screening questions prior to the visit, additional usage of staff PPE, and extensive cleaning of exam room while observing appropriate contact time as indicated for disinfecting solutions.    HPI Here for health maintenance exam and to review chronic medical problems    Wt Readings from Last 3 Encounters:  02/09/20 212 lb 3 oz (96.2 kg)  01/11/20 214 lb (97.1 kg)  12/23/19 217 lb 7 oz (98.6 kg)  wt is down 5 lb from early January  36.71 kg/m  Exercise- bike at least 30 minutes at a time  Feels better   Mammogram 10/17- has [facility] ([facility])  Self breast exam -no changes/lumps Sees Dr Lindi Adie every summer  Personal h/o breast cancer- now taking anastrozole (2.5 y left)  Still has hot flashes   Eye exam 9/20  Pap 1/19-neg [facility]  She has had a hysterectomy Has gyn appt upcoming   Colonoscopy 1/18  pna vaccine 2/20 Flu vaccine 1/21  Zoster status-wants the shingrix    dexa 1/19 osteopenia taking anastrozole for breast cancer  Taking alendronate  Falls -fell on ice out of town-no injuries  Fractures-none  Supplements-ca and D  Exercise -bike  bp is stable today  No cp or palpitations or headaches or edema  No side effects to medicines  BP Readings from Last 3 Encounters:  02/09/20 (!) 142/90  01/11/20 136/84  12/23/19 136/78     Hypothyroidism  Pt has no clinical changes No change in energy level/ hair or skin/ edema and no tremor Lab Results  Component Value Date   TSH 2.30 12/23/2019     Hyperlipidemia Lab Results  Component Value Date   CHOL 153 12/23/2019   CHOL 182 02/03/2019   CHOL 177 10/20/2018   Lab Results  Component Value Date   HDL 55.90 12/23/2019   HDL 53.20 02/03/2019   HDL 52.70 10/20/2018   Lab  Results  Component Value Date   LDLCALC 67 12/23/2019   LDLCALC 108 (H) 02/03/2019   LDLCALC 96 10/20/2018   Lab Results  Component Value Date   TRIG 153.0 (H) 12/23/2019   TRIG 104.0 02/03/2019   TRIG 142.0 10/20/2018   Lab Results  Component Value Date   CHOLHDL 3 12/23/2019   CHOLHDL 3 02/03/2019   CHOLHDL 3 10/20/2018   No results found for: LDLDIRECT crestor and diet   Diabetes 2 Lab Results  Component Value Date   HGBA1C 11.3 (H) 12/23/2019   This was not expected  Intol of metformin  Started detemir insulin- 20 u daily -some improvement  Runs between 130s- 300  (spikes in response to eating)  Lowest in the am fasting  Average is 159    On glipizide  Taking arb and statin  Using dexcom monitor   (it runs 24 points high - when she tried to calibrate it)  Sticks to a diabetic diet and cheats about once per week  Exercise was good and then she hurt her knee - and then had to take care of her husband  Patient Active Problem List   Diagnosis Date Noted  . Hyperlipidemia associated with type 2 diabetes mellitus ([place]) 11/02/2018  . Low back pain 03/21/2018  . Osteopenia 01/05/2018  . Aromatase inhibitor use 12/25/2017  .  Psoriasis 11/02/2016  . Colon cancer screening 11/02/2016  . Class 2 severe obesity due to excess calories with serious comorbidity and body mass index (BMI) of 36.0 to 36.9 in adult [facility]) 10/31/2015  . History of breast cancer 10/25/2014  . Type 2 diabetes mellitus with hyperglycemia ([place]) 07/14/2013  . Routine general medical examination at a health care facility 10/12/2012  . Hypothyroidism 06/03/2012  . Overactive bladder 05/27/2012  . HSV infection 07/07/2010  . URINARY URGENCY 07/15/2009  . LOW BACK PAIN, CHRONIC 05/17/2008  . HEADACHE, CHRONIC 05/17/2008  . Essential hypertension 02/12/2008   Past Medical History:  Diagnosis Date  . Anemia    "off and on"  . Arthritis    "right toes" (03/24/2015)  . Cancer of right breast ([facility])  2015  . Cyst of cystic duct    chest- central location, taking Cephalexin currently  . Diabetes mellitus without complication ([place])   . Fibroids    hyst. 2006  . GERD (gastroesophageal reflux disease)   . History of abnormal Pap smear    has had leep in past  . History of cold sores    has used valtrex in past  . History of recurrent UTIs   . Hypertension   . Hypothyroidism   . Intermittent vertigo   . Kidney stones   . Seborrheic dermatitis    local on R scalp and in ear (failed mult meds- uses steroid spray)  . Thyroid disease    Past Surgical History:  Procedure Laterality Date  . BREAST RECONSTRUCTION WITH PLACEMENT OF TISSUE EXPANDER AND FLEX HD (ACELLULAR HYDRATED DERMIS) Right 11/25/2014   Procedure: RIGHT BREAST RECONSTRUCTION WITH PLACEMENT OF TISSUE EXPANDER AND USE OF ACELLULAR DERMRAL MATRIX ;  Surgeon: Crissie Reese, MD;  Location: [facility];  Service: Plastics;  Laterality: Right;  . BREAST REDUCTION SURGERY Left 03/24/2015   Procedure: LEFT BREAST REDUCTION  (BREAST);  Surgeon: Crissie Reese, MD;  Location: [place];  Service: Plastics;  Laterality: Left;  . COLONOSCOPY    . COLONOSCOPY WITH PROPOFOL N/A 12/28/2016   Procedure: COLONOSCOPY WITH PROPOFOL;  Surgeon: Jonathon Bellows, MD;  Location: [facility];  Service: Endoscopy;  Laterality: N/A;  . LAPAROSCOPIC CHOLECYSTECTOMY  2003  . LITHOTRIPSY  ~ 2007  . REMOVAL OF TISSUE EXPANDER Right 03/24/2015  . REMOVAL OF TISSUE EXPANDER AND PLACEMENT OF IMPLANT Right 03/24/2015   w/delayed Architect  . REMOVAL OF TISSUE EXPANDER AND PLACEMENT OF IMPLANT Right 03/24/2015   Procedure: REMOVAL OF RIGHT BREAST TISSUE EXPANDER AND DELAYED BREAST RECONSTRUCTION WITH PLACEMENT OF IMPLANT;  Surgeon: Crissie Reese, MD;  Location: [place];  Service: Plastics;  Laterality: Right;  . SIMPLE MASTECTOMY WITH AXILLARY SENTINEL NODE BIOPSY Right 11/25/2014   Procedure: RIGHT TOTAL  MASTECTOMY WITH AXILLARY SENTINEL NODE BIOPSY ;  Surgeon: Excell Seltzer, MD;  Location: [facility];  Service: General;  Laterality: Right;  . TUBAL LIGATION    . VAGINAL DELIVERY  x3  . VAGINAL HYSTERECTOMY  2006   "fibroids"   Social History   Tobacco Use  . Smoking status: Never Smoker  . Smokeless tobacco: Never Used  Substance Use Topics  . Alcohol use: No    Alcohol/week: 0.0 standard drinks  . Drug use: No   Family History  Problem Relation Age of Onset  . Alcohol abuse Father   . COPD Father   . Hypertension Father   . Hypertension Mother   . Parkinson's disease Mother    Allergies  Allergen Reactions  .  Metformin And Related     Diarrhea (even XR)   . Norvasc [Amlodipine Besylate] Swelling   Current Outpatient Medications on File Prior to Visit  Medication Sig Dispense Refill  . anastrozole (ARIMIDEX) 1 MG tablet TAKE 1 TABLET(1 MG) BY MOUTH DAILY 90 tablet 3  . aspirin EC 81 MG tablet Take 81 mg by mouth daily.    [name] Kitchen augmented betamethasone dipropionate (DIPROLENE-AF) 0.05 % ointment Apply 1 application topically daily as needed.     . Calcium Carb-Cholecalciferol (CALCIUM 600/VITAMIN D3) 600-800 MG-UNIT TABS Take 2 tablets by mouth daily.    . meloxicam (MOBIC) 7.5 MG tablet Take 7.5 mg by mouth daily.    Glory Rosebush DELICA LANCETS FINE MISC Use to check blood sugar once daily and as needed (dx. E11.9) 100 each 1  . ONETOUCH VERIO test strip USE TO CHECK BLOOD SUGAR ONCE DAILY AND AS NEEDED 100 each 2  . valACYclovir (VALTREX) 500 MG tablet Take 1 tablet (500 mg total) by mouth 2 (two) times daily. For 5 days for an outbreak 10 tablet 3  . venlafaxine XR (EFFEXOR-XR) 75 MG 24 hr capsule TAKE 1 CAPSULE(75 MG) BY MOUTH DAILY WITH BREAKFAST 90 capsule 1   No current facility-administered medications on file prior to visit.     Review of Systems  Constitutional: Negative for activity change, appetite change, fatigue, fever and unexpected weight change.  HENT: Negative for congestion, ear pain, rhinorrhea, sinus pressure and sore  throat.   Eyes: Negative for pain, redness and visual disturbance.  Respiratory: Negative for cough, shortness of breath and wheezing.   Cardiovascular: Negative for chest pain and palpitations.  Gastrointestinal: Negative for abdominal pain, blood in stool, constipation and diarrhea.  Endocrine: Negative for polydipsia, polyphagia and polyuria.  Genitourinary: Negative for dysuria, frequency and urgency.  Musculoskeletal: Negative for arthralgias, back pain and myalgias.  Skin: Negative for pallor and rash.  Allergic/Immunologic: Negative for environmental allergies.  Neurological: Negative for dizziness, syncope and headaches.       Occ tingling in toes   Hematological: Negative for adenopathy. Does not bruise/bleed easily.  Psychiatric/Behavioral: Negative for decreased concentration and dysphoric mood. The patient is not nervous/anxious.        Objective:   Physical Exam Constitutional:      General: She is not in acute distress.    Appearance: Normal appearance. She is well-developed. She is obese. She is not ill-appearing or diaphoretic.  HENT:     Head: Normocephalic and atraumatic.     Right Ear: Tympanic membrane, ear canal and external ear normal.     Left Ear: Tympanic membrane, ear canal and external ear normal.     Nose: Nose normal. No congestion.     Mouth/Throat:     Mouth: Mucous membranes are moist.     Pharynx: Oropharynx is clear. No posterior oropharyngeal erythema.  Eyes:     General: No scleral icterus.    Extraocular Movements: Extraocular movements intact.     Conjunctiva/sclera: Conjunctivae normal.     Pupils: Pupils are equal, round, and reactive to light.  Neck:     Thyroid: No thyromegaly.     Vascular: No carotid bruit or JVD.  Cardiovascular:     Rate and Rhythm: Normal rate and regular rhythm.     Pulses: Normal pulses.     Heart sounds: Normal heart sounds. No gallop.   Pulmonary:     Effort: Pulmonary effort is normal. No respiratory  distress.     Breath  sounds: Normal breath sounds. No wheezing.     Comments: Good air exch Chest:     Chest wall: No tenderness.  Abdominal:     General: Bowel sounds are normal. There is no distension or abdominal bruit.     Palpations: Abdomen is soft. There is no mass.     Tenderness: There is no abdominal tenderness.     Hernia: No hernia is present.  Genitourinary:    Comments: Breast exam: No mass, nodules, thickening, tenderness, bulging, retraction, inflamation, nipple discharge or skin changes noted.  No axillary or clavicular LA.     Of note- R breast is a reconstruction after mastectomy Musculoskeletal:        General: No tenderness. Normal range of motion.     Cervical back: Normal range of motion and neck supple. No rigidity. No muscular tenderness.     Right lower leg: No edema.     Left lower leg: No edema.  Lymphadenopathy:     Cervical: No cervical adenopathy.  Skin:    General: Skin is warm and dry.     Coloration: Skin is not pale.     Findings: No erythema or rash.     Comments: Solar lentigines diffusely   Neurological:     Mental Status: She is alert. Mental status is at baseline.     Cranial Nerves: No cranial nerve deficit.     Motor: No abnormal muscle tone.     Coordination: Coordination normal.     Gait: Gait normal.     Deep Tendon Reflexes: Reflexes are normal and symmetric. Reflexes normal.  Psychiatric:        Mood and Affect: Mood normal.        Cognition and Memory: Cognition and memory normal.           Assessment & Plan:   Problem List Items Addressed This Visit      Cardiovascular and Mediastinum   Essential hypertension    bp in fair control at this time  BP Readings from Last 1 Encounters:  02/09/20 132/80   No changes needed Most recent labs reviewed  Disc lifstyle change with low sodium diet and exercise        Relevant Medications   losartan-hydrochlorothiazide (HYZAAR) 100-25 MG tablet   metoprolol succinate  (TOPROL-XL) 50 MG 24 hr tablet   rosuvastatin (CRESTOR) 5 MG tablet     Endocrine   Hypothyroidism    Hypothyroidism  Pt has no clinical changes No change in energy level/ hair or skin/ edema and no tremor Lab Results  Component Value Date   TSH 2.30 12/23/2019          Relevant Medications   levothyroxine (SYNTHROID) 50 MCG tablet   metoprolol succinate (TOPROL-XL) 50 MG 24 hr tablet   Type 2 diabetes mellitus with hyperglycemia ([facility])    Lab Results  Component Value Date   HGBA1C 11.3 (H) 12/23/2019   Since then - has dexcom meter (helpful) and taking basal insulin  Recent avg 159 (goes up to 300 after eating) and no lows  Will inc her basal insulin to 30 u daily  Urged to watch for hypoglycemia and update  Continue diabetic diet  Continue exercise and wt loss On arb and statin  Nl foot exam today  Eye exam is utd Plan f/u for May      Relevant Medications   glipiZIDE (GLUCOTROL XL) 10 MG 24 hr tablet   Insulin Detemir (LEVEMIR) 100 UNIT/ML Pen  losartan-hydrochlorothiazide (HYZAAR) 100-25 MG tablet   rosuvastatin (CRESTOR) 5 MG tablet   Hyperlipidemia associated with type 2 diabetes mellitus ([place])    Disc goals for lipids and reasons to control them Rev last labs with pt Rev low sat fat diet in detail Well controlled with crestor and diet       Relevant Medications   glipiZIDE (GLUCOTROL XL) 10 MG 24 hr tablet   Insulin Detemir (LEVEMIR) 100 UNIT/ML Pen   losartan-hydrochlorothiazide (HYZAAR) 100-25 MG tablet   rosuvastatin (CRESTOR) 5 MG tablet     Musculoskeletal and Integument   Osteopenia    Tolerating alendronate  For dexa soon at gyn office  No fractures (one fall on ice w/o injury) Taking arimidex  Also ca and D Good exercise habits         Other   Routine general medical examination at a health care facility - Primary    Reviewed health habits including diet and exercise and skin cancer prevention Reviewed appropriate screening tests for  age  Also reviewed health mt list, fam hx and immunization status , as well as social and family history   See HPI Labs reviewed  Sent for last mammogram and pap report from gyn  Has dexa upcoming (gyn)  She plans to check on coverage of shingrix vaccine Also plans to get covid vaccine when available        History of breast cancer    With R mastectomy and reconstruction  Taking aremidex with 2.5 years left  Continues oncology f/u with Dr Lindi Adie  Overall doing well  Watching bone density and on alendronate Has regular gyn fu as well       Class 2 severe obesity due to excess calories with serious comorbidity and body mass index (BMI) of 36.0 to 36.9 in adult [facility])    Discussed how this problem influences overall health and the risks it imposes  Reviewed plan for weight loss with lower calorie diet (via better food choices and also portion control or program like weight watchers) and exercise building up to or more than 30 minutes 5 days per week including some aerobic activity         Relevant Medications   glipiZIDE (GLUCOTROL XL) 10 MG 24 hr tablet   Insulin Detemir (LEVEMIR) 100 UNIT/ML Pen

## 2020-02-09 NOTE — Assessment & Plan Note (Signed)
Reviewed health habits including diet and exercise and skin cancer prevention Reviewed appropriate screening tests for age  Also reviewed health mt list, fam hx and immunization status , as well as social and family history   See HPI Labs reviewed  Sent for last mammogram and pap report from gyn  Has dexa upcoming (gyn)  She plans to check on coverage of shingrix vaccine Also plans to get covid vaccine when available

## 2020-02-09 NOTE — Assessment & Plan Note (Signed)
Discussed how this problem influences overall health and the risks it imposes  Reviewed plan for weight loss with lower calorie diet (via better food choices and also portion control or program like weight watchers) and exercise building up to or more than 30 minutes 5 days per week including some aerobic activity    

## 2020-02-09 NOTE — Assessment & Plan Note (Signed)
Tolerating alendronate  For dexa soon at gyn office  No fractures (one fall on ice w/o injury) Taking arimidex  Also ca and D Good exercise habits

## 2020-02-09 NOTE — Assessment & Plan Note (Signed)
Disc goals for lipids and reasons to control them Rev last labs with pt Rev low sat fat diet in detail Well controlled with crestor and diet   

## 2020-02-09 NOTE — Assessment & Plan Note (Signed)
bp in fair control at this time  BP Readings from Last 1 Encounters:  02/09/20 132/80   No changes needed Most recent labs reviewed  Disc lifstyle change with low sodium diet and exercise

## 2020-02-09 NOTE — Assessment & Plan Note (Signed)
With R mastectomy and reconstruction  Taking aremidex with 2.5 years left  Continues oncology f/u with Dr Lindi Adie  Overall doing well  Watching bone density and on alendronate Has regular gyn fu as well

## 2020-02-09 NOTE — Patient Instructions (Addendum)
Please ask [facility] to send [place] your upcoming mammogram and bone density report   If you are interested in the shingles vaccine series (Shingrix), call your insurance or pharmacy to check on coverage and location it must be given.  If affordable - you can schedule it here or at your pharmacy depending on coverage   Take care of yourself   Increase your basal insulin to 30 u daily  If your glucose gets too low please let me know

## 2020-02-16 NOTE — Telephone Encounter (Signed)
Diana Deleon from [place] has called on behalf of the pt. Stated Diana Deleon has taken the place of [facility] for her diabetic supplies as of January 18, 2020. [facility] phone [phone]. Said she needs a new rx for Dexcom 6 and sensors needs to be sent to them.   Pt will let us know exactly what she needs in a MyChart message.

## 2020-02-18 ENCOUNTER — Encounter: Payer: Self-pay | Admitting: Family Medicine

## 2020-02-22 NOTE — Telephone Encounter (Signed)
I called the phone # pt provided in the last message and they said before we can prescribe the meter that they have to finish getting pt set up in their system. The requested that the pt call the same # she provided me [phone]), once she has  finished getting set up in their system they will send [place] a form to fill out so they can order your supplies. They will not send [place] the form we need until pt has finished getting registered in their system. I sent pt a mychart message letting her know this. Once they are ready for [place] to send them the order they will fax [place] the form, until then there is nothing else we need to do on our end

## 2020-02-25 ENCOUNTER — Other Ambulatory Visit: Payer: Self-pay | Admitting: *Deleted

## 2020-02-25 MED ORDER — VALACYCLOVIR HCL 500 MG PO TABS: 500.0000 mg | ORAL_TABLET | Freq: Two times a day (BID) | ORAL | 1 refills | Status: DC

## 2020-02-28 ENCOUNTER — Other Ambulatory Visit: Payer: Self-pay | Admitting: Hematology and Oncology

## 2020-03-01 ENCOUNTER — Encounter: Payer: Self-pay | Admitting: Family Medicine

## 2020-03-01 DIAGNOSIS — E119 Type 2 diabetes mellitus without complications: Secondary | ICD-10-CM | POA: Insufficient documentation

## 2020-03-24 ENCOUNTER — Encounter: Payer: Self-pay | Admitting: Family Medicine

## 2020-03-25 NOTE — Telephone Encounter (Signed)
Received ppw regarding this order, placed in Dr. Marliss Coots inbox for review

## 2020-03-25 NOTE — Telephone Encounter (Signed)
Dr. Glori Bickers completed forms and I fax them back to insurance company, I will await a response

## 2020-03-26 ENCOUNTER — Ambulatory Visit: Payer: Managed Care, Other (non HMO) | Attending: Internal Medicine

## 2020-03-26 DIAGNOSIS — Z23 Encounter for immunization: Secondary | ICD-10-CM

## 2020-03-26 NOTE — Progress Notes (Signed)
   Covid-19 Vaccination Clinic  Name:  Diana Deleon    MRN: [id number] DOB: May 19, 1961  03/26/2020  [name] was observed post Covid-19 immunization for 15 minutes without incident. She was provided with Vaccine Information Sheet and instruction to access the V-Safe system.   [name] was instructed to call 911 with any severe reactions post vaccine: [name] Kitchen Difficulty breathing  . Swelling of face and throat  . A fast heartbeat  . A bad rash all over body  . Dizziness and weakness   Immunizations Administered    Name Date Dose VIS Date Route   Pfizer COVID-19 Vaccine 03/26/2020 10:19 AM 0.3 mL 11/27/2019 Intramuscular   Manufacturer: [place]   Lot: [id number]   [place]: [id number]

## 2020-03-29 NOTE — Telephone Encounter (Signed)
PA was denied, please see letter I placed in your inbox. I highlighted the bulletin of why it was declined for your review

## 2020-04-04 ENCOUNTER — Other Ambulatory Visit: Payer: Self-pay | Admitting: Orthopedic Surgery

## 2020-04-04 DIAGNOSIS — M1712 Unilateral primary osteoarthritis, left knee: Secondary | ICD-10-CM

## 2020-04-04 DIAGNOSIS — M2559 Pain in other specified joint: Secondary | ICD-10-CM

## 2020-04-11 ENCOUNTER — Ambulatory Visit
Admission: RE | Admit: 2020-04-11 | Discharge: 2020-04-11 | Disposition: A | Discharge status: Home / Self Care | Payer: Managed Care, Other (non HMO) | Source: Ambulatory Visit | Attending: Orthopedic Surgery | Admitting: Orthopedic Surgery

## 2020-04-11 ENCOUNTER — Other Ambulatory Visit: Payer: Self-pay

## 2020-04-11 DIAGNOSIS — M1712 Unilateral primary osteoarthritis, left knee: Secondary | ICD-10-CM | POA: Diagnosis not present

## 2020-04-11 DIAGNOSIS — M2559 Pain in other specified joint: Secondary | ICD-10-CM | POA: Insufficient documentation

## 2020-04-19 ENCOUNTER — Ambulatory Visit: Payer: Managed Care, Other (non HMO) | Attending: Internal Medicine

## 2020-04-19 DIAGNOSIS — Z23 Encounter for immunization: Secondary | ICD-10-CM

## 2020-04-19 NOTE — Progress Notes (Signed)
   Covid-19 Vaccination Clinic  Name:  Diana Deleon    MRN: [id number] DOB: Apr 29, 1961  04/19/2020  [name] was observed post Covid-19 immunization for 15 minutes without incident. She was provided with Vaccine Information Sheet and instruction to access the V-Safe system.   [name] was instructed to call 911 with any severe reactions post vaccine: [name] Kitchen Difficulty breathing  . Swelling of face and throat  . A fast heartbeat  . A bad rash all over body  . Dizziness and weakness   Immunizations Administered    Name Date Dose VIS Date Route   Pfizer COVID-19 Vaccine 04/19/2020  3:00 PM 0.3 mL 02/10/2019 Intramuscular   Manufacturer: [place]   Lot: [id number]   [place]: [id number]

## 2020-04-28 ENCOUNTER — Other Ambulatory Visit (INDEPENDENT_AMBULATORY_CARE_PROVIDER_SITE_OTHER): Payer: Managed Care, Other (non HMO)

## 2020-04-28 DIAGNOSIS — E1165 Type 2 diabetes mellitus with hyperglycemia: Secondary | ICD-10-CM

## 2020-04-28 DIAGNOSIS — E785 Hyperlipidemia, unspecified: Secondary | ICD-10-CM

## 2020-04-28 DIAGNOSIS — Z794 Long term (current) use of insulin: Secondary | ICD-10-CM | POA: Diagnosis not present

## 2020-04-28 DIAGNOSIS — I10 Essential (primary) hypertension: Secondary | ICD-10-CM | POA: Diagnosis not present

## 2020-04-28 DIAGNOSIS — E1169 Type 2 diabetes mellitus with other specified complication: Secondary | ICD-10-CM | POA: Diagnosis not present

## 2020-04-28 DIAGNOSIS — M1990 Unspecified osteoarthritis, unspecified site: Secondary | ICD-10-CM | POA: Insufficient documentation

## 2020-04-28 LAB — COMPREHENSIVE METABOLIC PANEL
ALT: 21 U/L (ref 0–35)
AST: 17 U/L (ref 0–37)
Albumin: 3.9 g/dL (ref 3.5–5.2)
Alkaline Phosphatase: 91 U/L (ref 39–117)
BUN: 21 mg/dL (ref 6–23)
CO2: 34 mEq/L — ABNORMAL HIGH (ref 19–32)
Calcium: 9.3 mg/dL (ref 8.4–10.5)
Chloride: 99 mEq/L (ref 96–112)
Creatinine, Ser: 1.1 mg/dL (ref 0.40–1.20)
GFR: 51.12 mL/min — ABNORMAL LOW (ref >60.00)
Glucose, Bld: 186 mg/dL — ABNORMAL HIGH (ref 70–99)
Potassium: 3.9 mEq/L (ref 3.5–5.1)
Sodium: 139 mEq/L (ref 135–145)
Total Bilirubin: 0.7 mg/dL (ref 0.2–1.2)
Total Protein: 6.8 g/dL (ref 6.0–8.3)

## 2020-04-28 LAB — LIPID PANEL
Cholesterol: 141 mg/dL (ref 0–200)
HDL: 47.2 mg/dL (ref >39.00)
LDL Cholesterol: 75 mg/dL (ref 0–99)
NonHDL: 94.28
Total CHOL/HDL Ratio: 3
Triglycerides: 97 mg/dL (ref 0.0–149.0)
VLDL: 19.4 mg/dL (ref 0.0–40.0)

## 2020-04-28 LAB — HEMOGLOBIN A1C: Hgb A1c MFr Bld: 8.6 % — ABNORMAL HIGH (ref 4.6–6.5)

## 2020-05-03 ENCOUNTER — Encounter: Payer: Self-pay | Admitting: Family Medicine

## 2020-05-03 ENCOUNTER — Other Ambulatory Visit: Payer: Self-pay

## 2020-05-03 ENCOUNTER — Ambulatory Visit: Payer: Managed Care, Other (non HMO) | Admitting: Family Medicine

## 2020-05-03 VITALS — BP 104/68 | HR 77 | Temp 96.2°F | Ht 63.75 in | Wt 217.3 lb

## 2020-05-03 DIAGNOSIS — E1169 Type 2 diabetes mellitus with other specified complication: Secondary | ICD-10-CM | POA: Diagnosis not present

## 2020-05-03 DIAGNOSIS — Z794 Long term (current) use of insulin: Secondary | ICD-10-CM

## 2020-05-03 DIAGNOSIS — I10 Essential (primary) hypertension: Secondary | ICD-10-CM | POA: Diagnosis not present

## 2020-05-03 DIAGNOSIS — E1165 Type 2 diabetes mellitus with hyperglycemia: Secondary | ICD-10-CM

## 2020-05-03 DIAGNOSIS — E785 Hyperlipidemia, unspecified: Secondary | ICD-10-CM

## 2020-05-03 DIAGNOSIS — Z6836 Body mass index (BMI) 36.0-36.9, adult: Secondary | ICD-10-CM

## 2020-05-03 MED ORDER — INSULIN DETEMIR 100 UNIT/ML FLEXPEN: 40.0000 [IU] | PEN_INJECTOR | Freq: Every day | SUBCUTANEOUS | 3 refills | Status: DC

## 2020-05-03 NOTE — Assessment & Plan Note (Signed)
.   Lab Results  Component Value Date   HGBA1C 8.6 (H) 04/28/2020  this is down from 11.3 (big improvement)  Eating is better  Also biking as much as her knee will allow  Commended on effort  Plan to inc basal insulin (levemir) to 40 u as tolerated (watching for hypoglycemia)  Continue other medicines F/u 3 mo Taking arb and statin

## 2020-05-03 NOTE — Assessment & Plan Note (Signed)
Disc goals for lipids and reasons to control them Rev last labs with pt Rev low sat fat diet in detail Fairly good control with crestor 5 mg  LDL of 75

## 2020-05-03 NOTE — Progress Notes (Signed)
Subjective:    Patient ID: Diana Deleon, female    DOB: Sep 14, 1961, 59 y.o.   MRN: [id number]  This visit occurred during the SARS-CoV-2 public health emergency.  Safety protocols were in place, including screening questions prior to the visit, additional usage of staff PPE, and extensive cleaning of exam room while observing appropriate contact time as indicated for disinfecting solutions.     HPI  Pt presents for f/u of chronic medical problems   Wt Readings from Last 3 Encounters:  05/03/20 217 lb 5 oz (98.6 kg)  02/09/20 212 lb 3 oz (96.2 kg)  01/11/20 214 lb (97.1 kg)   37.59 kg/m   She had her covid vaccines - did well with them   HTN bp is stable today  No cp or palpitations or headaches or edema  No side effects to medicines  BP Readings from Last 3 Encounters:  05/03/20 104/68  02/09/20 132/80  01/11/20 136/84     DM2-doing better   Home glucose reading  She was unable to get dexcom meter any more  (it may change in July)  Had some samples  Her readings differ from finger stick occasionally  Not consistent however   According to dexcom she has not been below 145  Tends to be the highest around dinner time  Not over 350   (was much higher in the past)   Still has a lot of stress /husband finally got a job but had some medical issues (copd and pulm fibrosis)  Her psoriasis is acting up   Last visit Hb a1c was 11.3  We inc her basal insulin to 30 u daily (levmir) Takes glipizide xl 10 mg  Intolerant of metformin  On arb and statin   Eating - she is eating the right things and portion sizes are good  Does not eat often enough Has a hard time eating breakfast  (has a hard time getting up to eat)  She has to be up at 7:15   Rides her exercise bike 30 min three days per week Knee will not allow much more    Now Lab Results  Component Value Date   HGBA1C 8.6 (H) 04/28/2020    Much improved !   She sees orthopedics - goal is A1c under 8 to get her  knee surgery    Hyperlipidemia Lab Results  Component Value Date   CHOL 141 04/28/2020   CHOL 153 12/23/2019   CHOL 182 02/03/2019   Lab Results  Component Value Date   HDL 47.20 04/28/2020   HDL 55.90 12/23/2019   HDL 53.20 02/03/2019   Lab Results  Component Value Date   LDLCALC 75 04/28/2020   LDLCALC 67 12/23/2019   LDLCALC 108 (H) 02/03/2019   Lab Results  Component Value Date   TRIG 97.0 04/28/2020   TRIG 153.0 (H) 12/23/2019   TRIG 104.0 02/03/2019   Lab Results  Component Value Date   CHOLHDL 3 04/28/2020   CHOLHDL 3 12/23/2019   CHOLHDL 3 02/03/2019   No results found for: LDLDIRECT Taking crestor 5 mg daily - tolerating that  Is eating low cholesterol   Patient Active Problem List   Diagnosis Date Noted  . Hyperlipidemia associated with type 2 diabetes mellitus ([place]) 11/02/2018  . Low back pain 03/21/2018  . Osteopenia 01/05/2018  . Aromatase inhibitor use 12/25/2017  . Psoriasis 11/02/2016  . Colon cancer screening 11/02/2016  . Class 2 severe obesity due to excess calories with serious comorbidity  and body mass index (BMI) of 36.0 to 36.9 in adult [facility]) 10/31/2015  . History of breast cancer 10/25/2014  . Type 2 diabetes mellitus with hyperglycemia ([place]) 07/14/2013  . Routine general medical examination at a health care facility 10/12/2012  . Hypothyroidism 06/03/2012  . Overactive bladder 05/27/2012  . HSV infection 07/07/2010  . URINARY URGENCY 07/15/2009  . LOW BACK PAIN, CHRONIC 05/17/2008  . HEADACHE, CHRONIC 05/17/2008  . Essential hypertension 02/12/2008   Past Medical History:  Diagnosis Date  . Anemia    "off and on"  . Arthritis    "right toes" (03/24/2015)  . Cancer of right breast ([place]) 2015  . Cyst of cystic duct    chest- central location, taking Cephalexin currently  . Diabetes mellitus without complication ([place])   . Fibroids    hyst. 2006  . GERD (gastroesophageal reflux disease)   . History of abnormal Pap smear    has  had leep in past  . History of cold sores    has used valtrex in past  . History of recurrent UTIs   . Hypertension   . Hypothyroidism   . Intermittent vertigo   . Kidney stones   . Seborrheic dermatitis    local on R scalp and in ear (failed mult meds- uses steroid spray)  . Thyroid disease    Past Surgical History:  Procedure Laterality Date  . BREAST RECONSTRUCTION WITH PLACEMENT OF TISSUE EXPANDER AND FLEX HD (ACELLULAR HYDRATED DERMIS) Right 11/25/2014   Procedure: RIGHT BREAST RECONSTRUCTION WITH PLACEMENT OF TISSUE EXPANDER AND USE OF ACELLULAR DERMRAL MATRIX ;  Surgeon: Crissie Reese, MD;  Location: [place];  Service: Plastics;  Laterality: Right;  . BREAST REDUCTION SURGERY Left 03/24/2015   Procedure: LEFT BREAST REDUCTION  (BREAST);  Surgeon: Crissie Reese, MD;  Location: [place];  Service: Plastics;  Laterality: Left;  . COLONOSCOPY    . COLONOSCOPY WITH PROPOFOL N/A 12/28/2016   Procedure: COLONOSCOPY WITH PROPOFOL;  Surgeon: Jonathon Bellows, MD;  Location: [facility];  Service: Endoscopy;  Laterality: N/A;  . LAPAROSCOPIC CHOLECYSTECTOMY  2003  . LITHOTRIPSY  ~ 2007  . REMOVAL OF TISSUE EXPANDER Right 03/24/2015  . REMOVAL OF TISSUE EXPANDER AND PLACEMENT OF IMPLANT Right 03/24/2015   w/delayed Architect  . REMOVAL OF TISSUE EXPANDER AND PLACEMENT OF IMPLANT Right 03/24/2015   Procedure: REMOVAL OF RIGHT BREAST TISSUE EXPANDER AND DELAYED BREAST RECONSTRUCTION WITH PLACEMENT OF IMPLANT;  Surgeon: Crissie Reese, MD;  Location: [place];  Service: Plastics;  Laterality: Right;  . SIMPLE MASTECTOMY WITH AXILLARY SENTINEL NODE BIOPSY Right 11/25/2014   Procedure: RIGHT TOTAL  MASTECTOMY WITH AXILLARY SENTINEL NODE BIOPSY ;  Surgeon: Excell Seltzer, MD;  Location: [place];  Service: General;  Laterality: Right;  . TUBAL LIGATION    . VAGINAL DELIVERY  x3  . VAGINAL HYSTERECTOMY  2006   "fibroids"   Social History   Tobacco Use  . Smoking status: Never Smoker  . Smokeless tobacco:  Never Used  Substance Use Topics  . Alcohol use: No    Alcohol/week: 0.0 standard drinks  . Drug use: No   Family History  Problem Relation Age of Onset  . Alcohol abuse Father   . COPD Father   . Hypertension Father   . Hypertension Mother   . Parkinson's disease Mother    Allergies  Allergen Reactions  . Metformin And Related     Diarrhea (even XR)   . Norvasc [Amlodipine Besylate] Swelling   Current Outpatient  Medications on File Prior to Visit  Medication Sig Dispense Refill  . alendronate (FOSAMAX) 70 MG tablet Take 1 tablet (70 mg total) by mouth every 7 (seven) days. Take with a full glass of water on an empty stomach. 12 tablet 3  . anastrozole (ARIMIDEX) 1 MG tablet TAKE 1 TABLET(1 MG) BY MOUTH DAILY 90 tablet 3  . aspirin EC 81 MG tablet Take 81 mg by mouth daily.    [name] Kitchen augmented betamethasone dipropionate (DIPROLENE-AF) 0.05 % ointment Apply 1 application topically daily as needed.     . Calcium Carb-Cholecalciferol (CALCIUM 600/VITAMIN D3) 600-800 MG-UNIT TABS Take 2 tablets by mouth daily.    [name] Kitchen glipiZIDE (GLUCOTROL XL) 10 MG 24 hr tablet Take 1 tablet (10 mg total) by mouth daily with breakfast. 90 tablet 3  . levothyroxine (SYNTHROID) 50 MCG tablet TAKE 1 TABLET(50 MCG) BY MOUTH DAILY 90 tablet 3  . losartan-hydrochlorothiazide (HYZAAR) 100-25 MG tablet Take 1 tablet by mouth daily. 90 tablet 3  . meloxicam (MOBIC) 7.5 MG tablet Take 7.5 mg by mouth daily.    . metoprolol succinate (TOPROL-XL) 50 MG 24 hr tablet Take 1 tablet (50 mg total) by mouth daily. Take with or immediately following a meal. 90 tablet 3  . omeprazole (PRILOSEC) 20 MG capsule TAKE 1 CAPSULE BY MOUTH DAILY BEFORE BREAKFAST 90 capsule 3  . ONETOUCH DELICA LANCETS FINE MISC Use to check blood sugar once daily and as needed (dx. E11.9) 100 each 1  . ONETOUCH VERIO test strip USE TO CHECK BLOOD SUGAR ONCE DAILY AND AS NEEDED 100 each 2  . rosuvastatin (CRESTOR) 5 MG tablet Take 1 tablet (5 mg total) by  mouth daily. 90 tablet 3  . valACYclovir (VALTREX) 500 MG tablet Take 1 tablet (500 mg total) by mouth 2 (two) times daily. For 5 days for an outbreak 10 tablet 1  . venlafaxine XR (EFFEXOR-XR) 75 MG 24 hr capsule TAKE 1 CAPSULE(75 MG) BY MOUTH DAILY WITH BREAKFAST 90 capsule 1   No current facility-administered medications on file prior to visit.     Review of Systems  Constitutional: Positive for activity change. Negative for appetite change, fatigue, fever and unexpected weight change.  HENT: Negative for congestion, ear pain, rhinorrhea, sinus pressure and sore throat.   Eyes: Negative for pain, redness and visual disturbance.  Respiratory: Negative for cough, shortness of breath and wheezing.   Cardiovascular: Negative for chest pain and palpitations.  Gastrointestinal: Negative for abdominal pain, blood in stool, constipation and diarrhea.  Endocrine: Negative for polydipsia and polyuria.  Genitourinary: Negative for dysuria, frequency and urgency.  Musculoskeletal: Positive for arthralgias. Negative for back pain and myalgias.  Skin: Negative for pallor and rash.  Allergic/Immunologic: Negative for environmental allergies.  Neurological: Negative for dizziness, syncope and headaches.  Hematological: Negative for adenopathy. Does not bruise/bleed easily.  Psychiatric/Behavioral: Negative for decreased concentration and dysphoric mood. The patient is not nervous/anxious.        Objective:   Physical Exam Constitutional:      General: She is not in acute distress.    Appearance: Normal appearance. She is well-developed. She is obese. She is not ill-appearing or diaphoretic.  HENT:     Head: Normocephalic and atraumatic.  Eyes:     General: No scleral icterus.    Conjunctiva/sclera: Conjunctivae normal.     Pupils: Pupils are equal, round, and reactive to light.  Neck:     Thyroid: No thyromegaly.     Vascular: No carotid bruit  or JVD.  Cardiovascular:     Rate and Rhythm:  Normal rate and regular rhythm.     Heart sounds: Normal heart sounds. No gallop.   Pulmonary:     Effort: Pulmonary effort is normal. No respiratory distress.     Breath sounds: Normal breath sounds. No wheezing or rales.  Abdominal:     General: Bowel sounds are normal. There is no distension or abdominal bruit.     Palpations: Abdomen is soft. There is no mass.     Tenderness: There is no abdominal tenderness.  Musculoskeletal:     Cervical back: Normal range of motion and neck supple.     Right lower leg: No edema.     Left lower leg: No edema.  Lymphadenopathy:     Cervical: No cervical adenopathy.  Skin:    General: Skin is warm and dry.     Coloration: Skin is not pale.     Findings: No rash.     Comments: Mildly tanned  Neurological:     Mental Status: She is alert. Mental status is at baseline.     Coordination: Coordination normal.     Deep Tendon Reflexes: Reflexes are normal and symmetric. Reflexes normal.  Psychiatric:        Mood and Affect: Mood normal.           Assessment & Plan:   Problem List Items Addressed This Visit      Cardiovascular and Mediastinum   Essential hypertension    bp in fair control at this time  BP Readings from Last 1 Encounters:  05/03/20 104/68   No changes needed Most recent labs reviewed  Disc lifstyle change with low sodium diet and exercise          Endocrine   Type 2 diabetes mellitus with hyperglycemia ([place]) - Primary    . Lab Results  Component Value Date   HGBA1C 8.6 (H) 04/28/2020  this is down from 11.3 (big improvement)  Eating is better  Also biking as much as her knee will allow  Commended on effort  Plan to inc basal insulin (levemir) to 40 u as tolerated (watching for hypoglycemia)  Continue other medicines F/u 3 mo Taking arb and statin        Relevant Medications   insulin detemir (LEVEMIR) 100 UNIT/ML FlexPen   Hyperlipidemia associated with type 2 diabetes mellitus (HCC)    Disc goals for  lipids and reasons to control them Rev last labs with pt Rev low sat fat diet in detail Fairly good control with crestor 5 mg  LDL of 75       Relevant Medications   insulin detemir (LEVEMIR) 100 UNIT/ML FlexPen     Other   Class 2 severe obesity due to excess calories with serious comorbidity and body mass index (BMI) of 36.0 to 36.9 in adult [facility])    Discussed how this problem influences overall health and the risks it imposes  Reviewed plan for weight loss with lower calorie diet (via better food choices and also portion control or program like weight watchers) and exercise building up to or more than 30 minutes 5 days per week including some aerobic activity   Finding it harder to loose weight now that bs are getting under control  Commended on effort so far        Relevant Medications   insulin detemir (LEVEMIR) 100 UNIT/ML FlexPen

## 2020-05-03 NOTE — Patient Instructions (Addendum)
Find something with protein for a quick breakfast-protein shake/oatmeal   Go up on your basal insulin to 40 units once daily   Keep eating right  Exercise as much as you safely can   If high/low- in 2 weeks -give me a call or email   Follow up in 3 months

## 2020-05-03 NOTE — Assessment & Plan Note (Signed)
bp in fair control at this time  BP Readings from Last 1 Encounters:  05/03/20 104/68   No changes needed Most recent labs reviewed  Disc lifstyle change with low sodium diet and exercise

## 2020-05-03 NOTE — Assessment & Plan Note (Signed)
Discussed how this problem influences overall health and the risks it imposes  Reviewed plan for weight loss with lower calorie diet (via better food choices and also portion control or program like weight watchers) and exercise building up to or more than 30 minutes 5 days per week including some aerobic activity   Finding it harder to loose weight now that bs are getting under control  Commended on effort so far

## 2020-05-10 ENCOUNTER — Other Ambulatory Visit: Payer: Self-pay | Admitting: Orthopedic Surgery

## 2020-06-10 ENCOUNTER — Other Ambulatory Visit: Payer: Self-pay

## 2020-06-10 ENCOUNTER — Encounter
Admission: RE | Admit: 2020-06-10 | Discharge: 2020-06-10 | Disposition: A | Discharge status: Home / Self Care | Payer: Managed Care, Other (non HMO) | Source: Ambulatory Visit | Attending: Orthopedic Surgery | Admitting: Orthopedic Surgery

## 2020-06-10 DIAGNOSIS — Z01818 Encounter for other preprocedural examination: Secondary | ICD-10-CM | POA: Insufficient documentation

## 2020-06-10 NOTE — Patient Instructions (Signed)
Your procedure is scheduled on: Tuesday 06/21/20.  Report to DAY SURGERY DEPARTMENT LOCATED ON 2ND FLOOR MEDICAL MALL ENTRANCE. To find out your arrival time please call [phone] between 1PM - 3PM on Friday 06/17/20.  Remember: Instructions that are not followed completely may result in serious medical risk, up to and including death, or upon the discretion of your surgeon and anesthesiologist your surgery may need to be rescheduled.      _X__ 1. Do not eat food after midnight the night before your procedure.                 No gum chewing or hard candies. You may drink SUGAR FREE clear liquids up to 2 hours                 before you are scheduled to arrive for your surgery- DO NOT drink clear                 liquids within 2 hours of the start of your surgery.  ** Dr. Rudene Christians would like for you to finish the G2 Gatorade 2 hours prior to your scheduled arrival time on the morning of your surgery. **                     __X__2.  On the morning of surgery brush your teeth with toothpaste and water, you may rinse your mouth with mouthwash if you wish.  Do not swallow any toothpaste or mouthwash.       _X__ 3.  No Alcohol for 24 hours before or after surgery.     _X__ 4.  Do Not Smoke or use e-cigarettes For 24 Hours Prior to Your Surgery.                 Do not use any chewable tobacco products for at least 6 hours prior to                 surgery.    __X__5.  Notify your doctor if there is any change in your medical condition      (cold, fever, infections).      Do not wear jewelry, make-up, hairpins, clips or nail polish. Do not wear lotions, powders, or perfumes.  Do not shave 48 hours prior to surgery. Men may shave face and neck. Do not bring valuables to the hospital.    [facility] is not responsible for any belongings or valuables.   Contacts, dentures/partials or body piercings may not be worn into surgery. Bring a case for your contacts, glasses or hearing aids, a  denture cup will be supplied.   For patients admitted to the hospital, discharge time is determined by your treatment team.     __X__ Take ONLY these medicines the morning of surgery with A SIP OF WATER:     1. anastrozole (ARIMIDEX)  2. levothyroxine (SYNTHROID)   3. metoprolol succinate (TOPROL-XL)  4. rosuvastatin (CRESTOR)   5. venlafaxine XR (EFFEXOR-XR)      __X__ Use CHG Soap as directed      __X__ Take 1/2 of usual insulin dose the night before surgery. No insulin the morning          of surgery.    __X__ Stop Blood Thinners: Aspirin. Your last dose will he on Monday 06/13/20.   __X__ Stop Anti-inflammatories 7 days before surgery such as Advil, Ibuprofen, Motrin, BC or Goodies Powder, Naprosyn, Naproxen, Aleve, Aspirin, Meloxicam. May take Tylenol  if needed for pain or discomfort.   __X__ Stop meloxicam (MOBIC). Your last dose will be on Monday 06/13/20.     __X__ Don't start any new herbal supplements or vitamins prior to your procedure.

## 2020-06-16 ENCOUNTER — Encounter: Payer: Self-pay | Admitting: Urgent Care

## 2020-06-16 ENCOUNTER — Encounter
Admission: RE | Admit: 2020-06-16 | Discharge: 2020-06-16 | Disposition: A | Discharge status: Home / Self Care | Payer: Managed Care, Other (non HMO) | Source: Ambulatory Visit | Attending: Orthopedic Surgery | Admitting: Orthopedic Surgery

## 2020-06-16 ENCOUNTER — Other Ambulatory Visit: Payer: Self-pay

## 2020-06-16 DIAGNOSIS — Z01818 Encounter for other preprocedural examination: Secondary | ICD-10-CM | POA: Insufficient documentation

## 2020-06-16 LAB — URINALYSIS, ROUTINE W REFLEX MICROSCOPIC
Bilirubin Urine: NEGATIVE
Glucose, UA: NEGATIVE mg/dL
Hgb urine dipstick: NEGATIVE
Ketones, ur: NEGATIVE mg/dL
Nitrite: NEGATIVE
Protein, ur: NEGATIVE mg/dL
Specific Gravity, Urine: 1.02 (ref 1.005–1.030)
WBC, UA: >50 WBC/hpf — ABNORMAL HIGH (ref 0–5)
pH: 5 (ref 5.0–8.0)

## 2020-06-16 LAB — COMPREHENSIVE METABOLIC PANEL
ALT: 22 U/L (ref 0–44)
AST: 20 U/L (ref 15–41)
Albumin: 4 g/dL (ref 3.5–5.0)
Alkaline Phosphatase: 92 U/L (ref 38–126)
Anion gap: 10 (ref 5–15)
BUN: 17 mg/dL (ref 6–20)
CO2: 31 mmol/L (ref 22–32)
Calcium: 9.2 mg/dL (ref 8.9–10.3)
Chloride: 97 mmol/L — ABNORMAL LOW (ref 98–111)
Creatinine, Ser: 1.02 mg/dL — ABNORMAL HIGH (ref 0.44–1.00)
GFR calc Af Amer: >60 mL/min (ref >60)
GFR calc non Af Amer: >60 mL/min (ref >60)
Glucose, Bld: 163 mg/dL — ABNORMAL HIGH (ref 70–99)
Potassium: 3.2 mmol/L — ABNORMAL LOW (ref 3.5–5.1)
Sodium: 138 mmol/L (ref 135–145)
Total Bilirubin: 0.9 mg/dL (ref 0.3–1.2)
Total Protein: 7.6 g/dL (ref 6.5–8.1)

## 2020-06-16 LAB — CBC WITH DIFFERENTIAL/PLATELET
Abs Immature Granulocytes: 0.01 10*3/uL (ref 0.00–0.07)
Basophils Absolute: 0 10*3/uL (ref 0.0–0.1)
Basophils Relative: 1 %
Eosinophils Absolute: 0.1 10*3/uL (ref 0.0–0.5)
Eosinophils Relative: 3 %
HCT: 42.5 % (ref 36.0–46.0)
Hemoglobin: 15 g/dL (ref 12.0–15.0)
Immature Granulocytes: 0 %
Lymphocytes Relative: 26 %
Lymphs Abs: 1.5 10*3/uL (ref 0.7–4.0)
MCH: 30.9 pg (ref 26.0–34.0)
MCHC: 35.3 g/dL (ref 30.0–36.0)
MCV: 87.6 fL (ref 80.0–100.0)
Monocytes Absolute: 0.6 10*3/uL (ref 0.1–1.0)
Monocytes Relative: 10 %
Neutro Abs: 3.4 10*3/uL (ref 1.7–7.7)
Neutrophils Relative %: 60 %
Platelets: 222 10*3/uL (ref 150–400)
RBC: 4.85 MIL/uL (ref 3.87–5.11)
RDW: 12.5 % (ref 11.5–15.5)
WBC: 5.6 10*3/uL (ref 4.0–10.5)
nRBC: 0 % (ref 0.0–0.2)

## 2020-06-16 LAB — TYPE AND SCREEN
ABO/RH(D): O POS
Antibody Screen: NEGATIVE

## 2020-06-16 LAB — SURGICAL PCR SCREEN
MRSA, PCR: NEGATIVE
Staphylococcus aureus: NEGATIVE

## 2020-06-16 NOTE — Progress Notes (Addendum)
  [facility] Perioperative Services: Pre-Admission/Anesthesia Testing  Abnormal Lab Notification  Date: 06/16/20  Name: Vira Chaplin MRN:   [id number]  Re: Abnormal labs noted during PAT appointment  Provider Notified: Hessie Knows, MD Notification mode: Routed via [place] of concern:  K+ 3.2 mmol/L  Lab Results  Component Value Date   COLORURINE YELLOW (A) 06/16/2020   APPEARANCEUR HAZY (A) 06/16/2020   LABSPEC 1.020 06/16/2020   PHURINE 5.0 06/16/2020   GLUCOSEU NEGATIVE 06/16/2020   HGBUR NEGATIVE 06/16/2020   BILIRUBINUR NEGATIVE 06/16/2020   KETONESUR NEGATIVE 06/16/2020   PROTEINUR NEGATIVE 06/16/2020   UROBILINOGEN 0.2 03/21/2018   NITRITE NEGATIVE 06/16/2020   LEUKOCYTESUR SMALL (A) 06/16/2020   EPIU 0-5 06/16/2020   WBCU >50 (H) 06/16/2020   RBCU 0-5 06/16/2020   BACTERIA FEW (A) 06/16/2020   Notes: Hypokalemia and urine findings were found during PAT visit; not treated. I did add a urine culture to further assess urine for pathogenically significant infection. Will forward to Dr. Rudene Christians for review.  ADDENDUM: 06/17/2020 at 1155 AM. Urine culture resulted today (see below). Spoke with Arvella Nigh, PA-C (orthopedics) who saw patient in clinic today and started her on treatment for her urinary tract infection.  Lab Results  Component Value Date   CULT (A) 06/16/2020    >=100,000 COLONIES/mL GRAM NEGATIVE RODS CULTURE REINCUBATED FOR BETTER GROWTH Performed at [facility], [place] [address] [place], [place] [address]    Honor Loh, MSN, APRN, FNP-C, CEN [facility]  Peri-operative Services Nurse Practitioner Phone: [phone] 06/16/20 9:39 AM

## 2020-06-17 ENCOUNTER — Other Ambulatory Visit
Admission: RE | Admit: 2020-06-17 | Discharge: 2020-06-17 | Disposition: A | Discharge status: Home / Self Care | Payer: Managed Care, Other (non HMO) | Source: Ambulatory Visit | Attending: Orthopedic Surgery | Admitting: Orthopedic Surgery

## 2020-06-17 DIAGNOSIS — Z20822 Contact with and (suspected) exposure to covid-19: Secondary | ICD-10-CM | POA: Diagnosis not present

## 2020-06-17 DIAGNOSIS — Z01812 Encounter for preprocedural laboratory examination: Secondary | ICD-10-CM | POA: Insufficient documentation

## 2020-06-17 LAB — SARS CORONAVIRUS 2 (TAT 6-24 HRS): SARS Coronavirus 2: NEGATIVE

## 2020-06-18 LAB — URINE CULTURE: Culture: >=100000 — AB

## 2020-06-21 ENCOUNTER — Other Ambulatory Visit: Payer: Self-pay

## 2020-06-21 ENCOUNTER — Encounter
Admission: RE | Disposition: A | Discharge status: Home / Self Care | Payer: Self-pay | Source: Home / Self Care | Attending: Orthopedic Surgery

## 2020-06-21 ENCOUNTER — Encounter: Payer: Self-pay | Admitting: Orthopedic Surgery

## 2020-06-21 ENCOUNTER — Observation Stay: Payer: Managed Care, Other (non HMO)

## 2020-06-21 ENCOUNTER — Inpatient Hospital Stay
Admission: RE | Admit: 2020-06-21 | Discharge: 2020-06-23 | DRG: 470 | Disposition: A | Discharge status: Home / Self Care | Payer: Managed Care, Other (non HMO) | Attending: Orthopedic Surgery | Admitting: Orthopedic Surgery

## 2020-06-21 ENCOUNTER — Inpatient Hospital Stay: Payer: Managed Care, Other (non HMO) | Admitting: Anesthesiology

## 2020-06-21 DIAGNOSIS — Z7982 Long term (current) use of aspirin: Secondary | ICD-10-CM

## 2020-06-21 DIAGNOSIS — Z96652 Presence of left artificial knee joint: Secondary | ICD-10-CM

## 2020-06-21 DIAGNOSIS — Z853 Personal history of malignant neoplasm of breast: Secondary | ICD-10-CM

## 2020-06-21 DIAGNOSIS — M25462 Effusion, left knee: Secondary | ICD-10-CM | POA: Diagnosis present

## 2020-06-21 DIAGNOSIS — Z87440 Personal history of urinary (tract) infections: Secondary | ICD-10-CM

## 2020-06-21 DIAGNOSIS — K5730 Diverticulosis of large intestine without perforation or abscess without bleeding: Secondary | ICD-10-CM | POA: Diagnosis present

## 2020-06-21 DIAGNOSIS — Z79899 Other long term (current) drug therapy: Secondary | ICD-10-CM

## 2020-06-21 DIAGNOSIS — E039 Hypothyroidism, unspecified: Secondary | ICD-10-CM | POA: Diagnosis present

## 2020-06-21 DIAGNOSIS — I10 Essential (primary) hypertension: Secondary | ICD-10-CM | POA: Diagnosis present

## 2020-06-21 DIAGNOSIS — Z8619 Personal history of other infectious and parasitic diseases: Secondary | ICD-10-CM

## 2020-06-21 DIAGNOSIS — Z794 Long term (current) use of insulin: Secondary | ICD-10-CM

## 2020-06-21 DIAGNOSIS — K219 Gastro-esophageal reflux disease without esophagitis: Secondary | ICD-10-CM | POA: Diagnosis present

## 2020-06-21 DIAGNOSIS — E876 Hypokalemia: Secondary | ICD-10-CM | POA: Diagnosis present

## 2020-06-21 DIAGNOSIS — L219 Seborrheic dermatitis, unspecified: Secondary | ICD-10-CM | POA: Diagnosis present

## 2020-06-21 DIAGNOSIS — Z79890 Hormone replacement therapy (postmenopausal): Secondary | ICD-10-CM

## 2020-06-21 DIAGNOSIS — M1712 Unilateral primary osteoarthritis, left knee: Principal | ICD-10-CM | POA: Diagnosis present

## 2020-06-21 DIAGNOSIS — Z791 Long term (current) use of non-steroidal anti-inflammatories (NSAID): Secondary | ICD-10-CM

## 2020-06-21 DIAGNOSIS — G8918 Other acute postprocedural pain: Secondary | ICD-10-CM

## 2020-06-21 DIAGNOSIS — M19071 Primary osteoarthritis, right ankle and foot: Secondary | ICD-10-CM | POA: Diagnosis present

## 2020-06-21 DIAGNOSIS — M7120 Synovial cyst of popliteal space [Baker], unspecified knee: Secondary | ICD-10-CM | POA: Diagnosis present

## 2020-06-21 DIAGNOSIS — Z888 Allergy status to other drugs, medicaments and biological substances status: Secondary | ICD-10-CM

## 2020-06-21 DIAGNOSIS — E119 Type 2 diabetes mellitus without complications: Secondary | ICD-10-CM | POA: Diagnosis present

## 2020-06-21 DIAGNOSIS — Z6835 Body mass index (BMI) 35.0-35.9, adult: Secondary | ICD-10-CM

## 2020-06-21 DIAGNOSIS — L409 Psoriasis, unspecified: Secondary | ICD-10-CM | POA: Diagnosis present

## 2020-06-21 DIAGNOSIS — Z9049 Acquired absence of other specified parts of digestive tract: Secondary | ICD-10-CM

## 2020-06-21 DIAGNOSIS — Z9010 Acquired absence of unspecified breast and nipple: Secondary | ICD-10-CM

## 2020-06-21 DIAGNOSIS — Z90710 Acquired absence of both cervix and uterus: Secondary | ICD-10-CM

## 2020-06-21 HISTORY — PX: TOTAL KNEE ARTHROPLASTY: SHX125

## 2020-06-21 LAB — POCT I-STAT, CHEM 8
BUN: 19 mg/dL (ref 6–20)
Calcium, Ion: 1.12 mmol/L — ABNORMAL LOW (ref 1.15–1.40)
Chloride: 98 mmol/L (ref 98–111)
Creatinine, Ser: 1.1 mg/dL — ABNORMAL HIGH (ref 0.44–1.00)
Glucose, Bld: 217 mg/dL — ABNORMAL HIGH (ref 70–99)
HCT: 46 % (ref 36.0–46.0)
Hemoglobin: 15.6 g/dL — ABNORMAL HIGH (ref 12.0–15.0)
Potassium: 3.4 mmol/L — ABNORMAL LOW (ref 3.5–5.1)
Sodium: 137 mmol/L (ref 135–145)
TCO2: 26 mmol/L (ref 22–32)

## 2020-06-21 LAB — GLUCOSE, CAPILLARY
Glucose-Capillary: 218 mg/dL — ABNORMAL HIGH (ref 70–99)
Glucose-Capillary: 170 mg/dL — ABNORMAL HIGH (ref 70–99)
Glucose-Capillary: 370 mg/dL — ABNORMAL HIGH (ref 70–99)
Glucose-Capillary: 250 mg/dL — ABNORMAL HIGH (ref 70–99)

## 2020-06-21 LAB — CBC
HCT: 40.3 % (ref 36.0–46.0)
Hemoglobin: 14.5 g/dL (ref 12.0–15.0)
MCH: 30.3 pg (ref 26.0–34.0)
MCHC: 36 g/dL (ref 30.0–36.0)
MCV: 84.3 fL (ref 80.0–100.0)
Platelets: 225 10*3/uL (ref 150–400)
RBC: 4.78 MIL/uL (ref 3.87–5.11)
RDW: 12.5 % (ref 11.5–15.5)
WBC: 8.6 10*3/uL (ref 4.0–10.5)
nRBC: 0 % (ref 0.0–0.2)

## 2020-06-21 LAB — CREATININE, SERUM
Creatinine, Ser: 1.06 mg/dL — ABNORMAL HIGH (ref 0.44–1.00)
GFR calc Af Amer: >60 mL/min (ref >60)
GFR calc non Af Amer: 58 mL/min — ABNORMAL LOW (ref >60)

## 2020-06-21 LAB — ABO/RH: ABO/RH(D): O POS

## 2020-06-21 SURGERY — ARTHROPLASTY, KNEE, TOTAL
Anesthesia: Spinal | Site: Knee | Laterality: Left

## 2020-06-21 MED ORDER — PROPOFOL 500 MG/50ML IV EMUL
INTRAVENOUS | Status: DC | PRN
  Administered 2020-06-21: 75 ug/kg/min via INTRAVENOUS

## 2020-06-21 MED ORDER — ENOXAPARIN SODIUM 30 MG/0.3ML ~~LOC~~ SOLN
30.0000 mg | Freq: Two times a day (BID) | SUBCUTANEOUS | Status: DC
  Administered 2020-06-22 – 2020-06-23 (×3): 30 mg via SUBCUTANEOUS
  Filled 2020-06-21 (×3): qty 0.3

## 2020-06-21 MED ORDER — MENTHOL 3 MG MT LOZG
1.0000 | LOZENGE | OROMUCOSAL | Status: DC | PRN
  Filled 2020-06-21: qty 9

## 2020-06-21 MED ORDER — TRIAMCINOLONE ACETONIDE 0.5 % EX OINT
TOPICAL_OINTMENT | Freq: Two times a day (BID) | CUTANEOUS | Status: DC
  Filled 2020-06-21: qty 15

## 2020-06-21 MED ORDER — VENLAFAXINE HCL ER 75 MG PO CP24
75.0000 mg | ORAL_CAPSULE | Freq: Every day | ORAL | Status: DC
  Administered 2020-06-22 – 2020-06-23 (×2): 75 mg via ORAL
  Filled 2020-06-21 (×2): qty 1

## 2020-06-21 MED ORDER — NEOMYCIN-POLYMYXIN B GU 40-200000 IR SOLN
Status: DC | PRN
  Administered 2020-06-21: 14 mL

## 2020-06-21 MED ORDER — SODIUM CHLORIDE 0.9 % IV SOLN: INTRAVENOUS | Status: DC

## 2020-06-21 MED ORDER — ONDANSETRON HCL 4 MG/2ML IJ SOLN: 4.0000 mg | Freq: Four times a day (QID) | INTRAMUSCULAR | Status: DC | PRN

## 2020-06-21 MED ORDER — ONDANSETRON HCL 4 MG/2ML IJ SOLN
INTRAMUSCULAR | Status: AC
  Filled 2020-06-21: qty 2

## 2020-06-21 MED ORDER — INSULIN ASPART 100 UNIT/ML ~~LOC~~ SOLN
0.0000 [IU] | Freq: Three times a day (TID) | SUBCUTANEOUS | Status: DC
  Administered 2020-06-21: 15 [IU] via SUBCUTANEOUS
  Administered 2020-06-22: 5 [IU] via SUBCUTANEOUS
  Administered 2020-06-22 (×2): 3 [IU] via SUBCUTANEOUS
  Administered 2020-06-23: 2 [IU] via SUBCUTANEOUS
  Filled 2020-06-21 (×5): qty 1

## 2020-06-21 MED ORDER — ROSUVASTATIN CALCIUM 5 MG PO TABS
5.0000 mg | ORAL_TABLET | Freq: Every day | ORAL | Status: DC
  Administered 2020-06-22 – 2020-06-23 (×2): 5 mg via ORAL
  Filled 2020-06-21 (×2): qty 1

## 2020-06-21 MED ORDER — FENTANYL CITRATE (PF) 100 MCG/2ML IJ SOLN: 25.0000 ug | INTRAMUSCULAR | Status: DC | PRN

## 2020-06-21 MED ORDER — PANTOPRAZOLE SODIUM 40 MG PO TBEC
80.0000 mg | DELAYED_RELEASE_TABLET | Freq: Every day | ORAL | Status: DC
  Administered 2020-06-22 – 2020-06-23 (×2): 80 mg via ORAL
  Filled 2020-06-21 (×2): qty 2

## 2020-06-21 MED ORDER — DEXAMETHASONE SODIUM PHOSPHATE 10 MG/ML IJ SOLN
INTRAMUSCULAR | Status: AC
  Filled 2020-06-21: qty 1

## 2020-06-21 MED ORDER — ONDANSETRON HCL 4 MG/2ML IJ SOLN
INTRAMUSCULAR | Status: DC | PRN
  Administered 2020-06-21: 4 mg via INTRAVENOUS

## 2020-06-21 MED ORDER — SODIUM CHLORIDE 0.9 % IV SOLN
INTRAVENOUS | Status: DC | PRN
  Administered 2020-06-21: 15 ug/min via INTRAVENOUS

## 2020-06-21 MED ORDER — MAGNESIUM HYDROXIDE 400 MG/5ML PO SUSP
30.0000 mL | Freq: Every day | ORAL | Status: DC | PRN
  Filled 2020-06-21 (×2): qty 30

## 2020-06-21 MED ORDER — GLIPIZIDE ER 10 MG PO TB24
10.0000 mg | ORAL_TABLET | Freq: Every day | ORAL | Status: DC
  Administered 2020-06-22 – 2020-06-23 (×2): 10 mg via ORAL
  Filled 2020-06-21 (×2): qty 1

## 2020-06-21 MED ORDER — GLYCOPYRROLATE 0.2 MG/ML IJ SOLN
INTRAMUSCULAR | Status: AC
  Filled 2020-06-21: qty 1

## 2020-06-21 MED ORDER — ASPIRIN EC 81 MG PO TBEC
81.0000 mg | DELAYED_RELEASE_TABLET | Freq: Every day | ORAL | Status: DC
  Administered 2020-06-22 – 2020-06-23 (×2): 81 mg via ORAL
  Filled 2020-06-21 (×2): qty 1

## 2020-06-21 MED ORDER — HYDROCODONE-ACETAMINOPHEN 7.5-325 MG PO TABS
1.0000 | ORAL_TABLET | ORAL | Status: DC | PRN
  Administered 2020-06-22: 1 via ORAL
  Filled 2020-06-21: qty 2
  Filled 2020-06-21: qty 1

## 2020-06-21 MED ORDER — ACETAMINOPHEN 10 MG/ML IV SOLN
INTRAVENOUS | Status: DC | PRN
  Administered 2020-06-21: 1000 mg via INTRAVENOUS

## 2020-06-21 MED ORDER — ZOLPIDEM TARTRATE 5 MG PO TABS: 5.0000 mg | ORAL_TABLET | Freq: Every evening | ORAL | Status: DC | PRN

## 2020-06-21 MED ORDER — PROPOFOL 500 MG/50ML IV EMUL
INTRAVENOUS | Status: AC
  Filled 2020-06-21: qty 50

## 2020-06-21 MED ORDER — BUPIVACAINE-EPINEPHRINE (PF) 0.25% -1:200000 IJ SOLN
INTRAMUSCULAR | Status: AC
  Filled 2020-06-21: qty 30

## 2020-06-21 MED ORDER — MORPHINE SULFATE (PF) 2 MG/ML IV SOLN: 0.5000 mg | INTRAVENOUS | Status: DC | PRN

## 2020-06-21 MED ORDER — SODIUM CHLORIDE 0.9 % IV SOLN
INTRAVENOUS | Status: DC | PRN
  Administered 2020-06-21: 60 mL

## 2020-06-21 MED ORDER — ORAL CARE MOUTH RINSE: 15.0000 mL | Freq: Once | OROMUCOSAL | Status: AC

## 2020-06-21 MED ORDER — BUPIVACAINE HCL (PF) 0.5 % IJ SOLN
INTRAMUSCULAR | Status: DC | PRN
Start: 2020-06-21 — End: 2020-06-21
  Administered 2020-06-21: 3 mL

## 2020-06-21 MED ORDER — PROPOFOL 500 MG/50ML IV EMUL
INTRAVENOUS | Status: AC
  Filled 2020-06-21: qty 100

## 2020-06-21 MED ORDER — PROPOFOL 10 MG/ML IV BOLUS
INTRAVENOUS | Status: AC
  Filled 2020-06-21: qty 20

## 2020-06-21 MED ORDER — MIDAZOLAM HCL 2 MG/2ML IJ SOLN
INTRAMUSCULAR | Status: DC | PRN
  Administered 2020-06-21 (×2): 1 mg via INTRAVENOUS

## 2020-06-21 MED ORDER — CEFAZOLIN SODIUM-DEXTROSE 2-4 GM/100ML-% IV SOLN
INTRAVENOUS | Status: AC
  Filled 2020-06-21: qty 100

## 2020-06-21 MED ORDER — CHLORHEXIDINE GLUCONATE 0.12 % MT SOLN
15.0000 mL | Freq: Once | OROMUCOSAL | Status: AC
  Administered 2020-06-21: 15 mL via OROMUCOSAL

## 2020-06-21 MED ORDER — METOPROLOL SUCCINATE ER 25 MG PO TB24
50.0000 mg | ORAL_TABLET | Freq: Every day | ORAL | Status: DC
  Administered 2020-06-22 – 2020-06-23 (×2): 50 mg via ORAL
  Filled 2020-06-21 (×2): qty 2

## 2020-06-21 MED ORDER — LOSARTAN POTASSIUM-HCTZ 100-25 MG PO TABS: 1.0000 | ORAL_TABLET | Freq: Every day | ORAL | Status: DC

## 2020-06-21 MED ORDER — MORPHINE SULFATE (PF) 10 MG/ML IV SOLN
INTRAVENOUS | Status: DC | PRN
  Administered 2020-06-21: 10 mg

## 2020-06-21 MED ORDER — ONDANSETRON HCL 4 MG/2ML IJ SOLN: 4.0000 mg | Freq: Once | INTRAMUSCULAR | Status: DC | PRN

## 2020-06-21 MED ORDER — MORPHINE SULFATE (PF) 10 MG/ML IV SOLN
INTRAVENOUS | Status: AC
  Filled 2020-06-21: qty 1

## 2020-06-21 MED ORDER — ACETAMINOPHEN 10 MG/ML IV SOLN
INTRAVENOUS | Status: AC
  Filled 2020-06-21: qty 100

## 2020-06-21 MED ORDER — BUPIVACAINE-EPINEPHRINE (PF) 0.25% -1:200000 IJ SOLN
INTRAMUSCULAR | Status: DC | PRN
  Administered 2020-06-21: 30 mL

## 2020-06-21 MED ORDER — MAGNESIUM CITRATE PO SOLN
1.0000 | Freq: Once | ORAL | Status: DC | PRN
  Filled 2020-06-21: qty 296

## 2020-06-21 MED ORDER — TRANEXAMIC ACID-NACL 1000-0.7 MG/100ML-% IV SOLN
INTRAVENOUS | Status: AC
  Administered 2020-06-21: 1000 mg via INTRAVENOUS
  Filled 2020-06-21: qty 100

## 2020-06-21 MED ORDER — METOCLOPRAMIDE HCL 10 MG PO TABS: 5.0000 mg | ORAL_TABLET | Freq: Three times a day (TID) | ORAL | Status: DC | PRN

## 2020-06-21 MED ORDER — FENTANYL CITRATE (PF) 100 MCG/2ML IJ SOLN
INTRAMUSCULAR | Status: AC
  Filled 2020-06-21: qty 2

## 2020-06-21 MED ORDER — BISACODYL 10 MG RE SUPP
10.0000 mg | Freq: Every day | RECTAL | Status: DC | PRN
  Administered 2020-06-23: 10 mg via RECTAL
  Filled 2020-06-21 (×2): qty 1

## 2020-06-21 MED ORDER — FENTANYL CITRATE (PF) 100 MCG/2ML IJ SOLN
INTRAMUSCULAR | Status: DC | PRN
  Administered 2020-06-21 (×2): 50 ug via INTRAVENOUS

## 2020-06-21 MED ORDER — BUPIVACAINE LIPOSOME 1.3 % IJ SUSP
INTRAMUSCULAR | Status: AC
  Filled 2020-06-21: qty 20

## 2020-06-21 MED ORDER — PHENYLEPHRINE HCL (PRESSORS) 10 MG/ML IV SOLN
INTRAVENOUS | Status: AC
  Filled 2020-06-21: qty 1

## 2020-06-21 MED ORDER — DOCUSATE SODIUM 100 MG PO CAPS
100.0000 mg | ORAL_CAPSULE | Freq: Two times a day (BID) | ORAL | Status: DC
  Administered 2020-06-21 – 2020-06-23 (×4): 100 mg via ORAL
  Filled 2020-06-21 (×4): qty 1

## 2020-06-21 MED ORDER — CEFAZOLIN SODIUM-DEXTROSE 2-4 GM/100ML-% IV SOLN
2.0000 g | INTRAVENOUS | Status: AC
  Administered 2020-06-21: 2 g via INTRAVENOUS

## 2020-06-21 MED ORDER — HYDROCODONE-ACETAMINOPHEN 5-325 MG PO TABS
1.0000 | ORAL_TABLET | ORAL | Status: DC | PRN
  Administered 2020-06-21: 2 via ORAL
  Administered 2020-06-22 – 2020-06-23 (×2): 1 via ORAL
  Administered 2020-06-23: 2 via ORAL
  Filled 2020-06-21: qty 1
  Filled 2020-06-21: qty 2
  Filled 2020-06-21: qty 1
  Filled 2020-06-21: qty 2

## 2020-06-21 MED ORDER — SODIUM CHLORIDE FLUSH 0.9 % IV SOLN
INTRAVENOUS | Status: AC
  Filled 2020-06-21: qty 40

## 2020-06-21 MED ORDER — INSULIN DETEMIR 100 UNIT/ML ~~LOC~~ SOLN
40.0000 [IU] | Freq: Every day | SUBCUTANEOUS | Status: DC
  Administered 2020-06-21 – 2020-06-22 (×2): 40 [IU] via SUBCUTANEOUS
  Filled 2020-06-21 (×3): qty 0.4

## 2020-06-21 MED ORDER — METHOCARBAMOL 500 MG PO TABS
500.0000 mg | ORAL_TABLET | Freq: Four times a day (QID) | ORAL | Status: DC | PRN
  Administered 2020-06-23: 500 mg via ORAL
  Filled 2020-06-21: qty 1

## 2020-06-21 MED ORDER — METHOCARBAMOL 1000 MG/10ML IJ SOLN
500.0000 mg | Freq: Four times a day (QID) | INTRAVENOUS | Status: DC | PRN
  Filled 2020-06-21: qty 5

## 2020-06-21 MED ORDER — VALACYCLOVIR HCL 500 MG PO TABS
500.0000 mg | ORAL_TABLET | Freq: Two times a day (BID) | ORAL | Status: DC | PRN
  Filled 2020-06-21: qty 1

## 2020-06-21 MED ORDER — NEOMYCIN-POLYMYXIN B GU 40-200000 IR SOLN
Status: AC
  Filled 2020-06-21: qty 20

## 2020-06-21 MED ORDER — DEXAMETHASONE SODIUM PHOSPHATE 10 MG/ML IJ SOLN
INTRAMUSCULAR | Status: DC | PRN
Start: 2020-06-21 — End: 2020-06-21
  Administered 2020-06-21: 10 mg via INTRAVENOUS

## 2020-06-21 MED ORDER — CEFAZOLIN SODIUM-DEXTROSE 2-4 GM/100ML-% IV SOLN
2.0000 g | Freq: Four times a day (QID) | INTRAVENOUS | Status: AC
  Administered 2020-06-21 (×2): 2 g via INTRAVENOUS
  Filled 2020-06-21 (×2): qty 100

## 2020-06-21 MED ORDER — TRAMADOL HCL 50 MG PO TABS
50.0000 mg | ORAL_TABLET | Freq: Four times a day (QID) | ORAL | Status: DC
  Administered 2020-06-21 – 2020-06-23 (×7): 50 mg via ORAL
  Filled 2020-06-21 (×8): qty 1

## 2020-06-21 MED ORDER — MIDAZOLAM HCL 2 MG/2ML IJ SOLN
INTRAMUSCULAR | Status: AC
  Filled 2020-06-21: qty 2

## 2020-06-21 MED ORDER — LIDOCAINE HCL (PF) 2 % IJ SOLN
INTRAMUSCULAR | Status: AC
  Filled 2020-06-21: qty 5

## 2020-06-21 MED ORDER — DIPHENHYDRAMINE HCL 12.5 MG/5ML PO ELIX
12.5000 mg | ORAL_SOLUTION | ORAL | Status: DC | PRN
  Filled 2020-06-21: qty 10

## 2020-06-21 MED ORDER — ONDANSETRON HCL 4 MG PO TABS: 4.0000 mg | ORAL_TABLET | Freq: Four times a day (QID) | ORAL | Status: DC | PRN

## 2020-06-21 MED ORDER — LEVOTHYROXINE SODIUM 50 MCG PO TABS
50.0000 ug | ORAL_TABLET | Freq: Every day | ORAL | Status: DC
  Administered 2020-06-22 – 2020-06-23 (×2): 50 ug via ORAL
  Filled 2020-06-21 (×2): qty 1

## 2020-06-21 MED ORDER — CEFAZOLIN SODIUM 1 G IJ SOLR
INTRAMUSCULAR | Status: AC
  Filled 2020-06-21: qty 10

## 2020-06-21 MED ORDER — ALUM & MAG HYDROXIDE-SIMETH 200-200-20 MG/5ML PO SUSP: 30.0000 mL | ORAL | Status: DC | PRN

## 2020-06-21 MED ORDER — PHENOL 1.4 % MT LIQD
1.0000 | OROMUCOSAL | Status: DC | PRN
  Filled 2020-06-21: qty 177

## 2020-06-21 MED ORDER — PHENYLEPHRINE HCL (PRESSORS) 10 MG/ML IV SOLN
INTRAVENOUS | Status: DC | PRN
  Administered 2020-06-21 (×2): 100 ug via INTRAVENOUS

## 2020-06-21 MED ORDER — ANASTROZOLE 1 MG PO TABS
1.0000 mg | ORAL_TABLET | Freq: Every day | ORAL | Status: DC
  Administered 2020-06-22 – 2020-06-23 (×2): 1 mg via ORAL
  Filled 2020-06-21 (×2): qty 1

## 2020-06-21 MED ORDER — TRANEXAMIC ACID-NACL 1000-0.7 MG/100ML-% IV SOLN: 1000.0000 mg | Freq: Once | INTRAVENOUS | Status: AC

## 2020-06-21 MED ORDER — METOCLOPRAMIDE HCL 5 MG/ML IJ SOLN: 5.0000 mg | Freq: Three times a day (TID) | INTRAMUSCULAR | Status: DC | PRN

## 2020-06-21 MED ORDER — GLYCOPYRROLATE 0.2 MG/ML IJ SOLN
INTRAMUSCULAR | Status: DC | PRN
Start: 2020-06-21 — End: 2020-06-21
  Administered 2020-06-21: .2 mg via INTRAVENOUS

## 2020-06-21 MED ORDER — ACETAMINOPHEN 325 MG PO TABS: 325.0000 mg | ORAL_TABLET | Freq: Four times a day (QID) | ORAL | Status: DC | PRN

## 2020-06-21 MED ORDER — LOSARTAN POTASSIUM 50 MG PO TABS
100.0000 mg | ORAL_TABLET | Freq: Every day | ORAL | Status: DC
  Administered 2020-06-22 – 2020-06-23 (×2): 100 mg via ORAL
  Filled 2020-06-21 (×2): qty 2

## 2020-06-21 MED ORDER — CHLORHEXIDINE GLUCONATE 0.12 % MT SOLN
OROMUCOSAL | Status: AC
  Filled 2020-06-21: qty 15

## 2020-06-21 MED ORDER — HYDROCHLOROTHIAZIDE 25 MG PO TABS
25.0000 mg | ORAL_TABLET | Freq: Every day | ORAL | Status: DC
  Administered 2020-06-22 – 2020-06-23 (×2): 25 mg via ORAL
  Filled 2020-06-21 (×2): qty 1

## 2020-06-21 SURGICAL SUPPLY — 71 items
BLADE SAGITTAL 25.0X1.19X90 (BLADE) ×2 IMPLANT
BLADE SAGITTAL AGGR TOOTH XLG (BLADE) ×2 IMPLANT
BLOCK CUTTING FEMUR 3 LT MED (MISCELLANEOUS) ×2 IMPLANT
BLOCK CUTTING TIBIAL 2 LT (MISCELLANEOUS) ×2 IMPLANT
BNDG ELASTIC 6X5.8 VLCR STR LF (GAUZE/BANDAGES/DRESSINGS) ×2 IMPLANT
CANISTER SUCT 1200ML W/VALVE (MISCELLANEOUS) ×2 IMPLANT
CANISTER SUCT 3000ML PPV (MISCELLANEOUS) ×4 IMPLANT
CANISTER WOUND CARE 500ML ATS (WOUND CARE) ×2 IMPLANT
CEMENT HV SMART SET (Cement) ×4 IMPLANT
CEMENT PATELLA RESURF SZ1 (Cement) ×2 IMPLANT
CHLORAPREP W/TINT 26 (MISCELLANEOUS) ×4 IMPLANT
COOLER POLAR GLACIER W/PUMP (MISCELLANEOUS) ×2 IMPLANT
COVER WAND RF STERILE (DRAPES) ×2 IMPLANT
CUFF TOURN SGL QUICK 24 (TOURNIQUET CUFF)
CUFF TOURN SGL QUICK 30 (TOURNIQUET CUFF) ×1
CUFF TRNQT CYL 24X4X16.5-23 (TOURNIQUET CUFF) IMPLANT
CUFF TRNQT CYL 30X4X21-28X (TOURNIQUET CUFF) ×1 IMPLANT
DRAPE 3/4 80X56 (DRAPES) ×4 IMPLANT
ELECT CAUTERY BLADE 6.4 (BLADE) ×2 IMPLANT
ELECT REM PT RETURN 9FT ADLT (ELECTROSURGICAL) ×2
ELECTRODE REM PT RTRN 9FT ADLT (ELECTROSURGICAL) ×1 IMPLANT
FEMORAL COMP CEMENTED SZ3 L (Femur) ×2 IMPLANT
FEMUR BONE MODEL 4.9010 MEDACT (MISCELLANEOUS) ×2 IMPLANT
GAUZE SPONGE 4X4 12PLY STRL (GAUZE/BANDAGES/DRESSINGS) ×2 IMPLANT
GAUZE XEROFORM 1X8 LF (GAUZE/BANDAGES/DRESSINGS) ×2 IMPLANT
GLOVE BIOGEL PI IND STRL 9 (GLOVE) ×1 IMPLANT
GLOVE BIOGEL PI INDICATOR 9 (GLOVE) ×1
GLOVE INDICATOR 8.0 STRL GRN (GLOVE) ×2 IMPLANT
GLOVE SURG ORTHO 8.0 STRL STRW (GLOVE) ×2 IMPLANT
GLOVE SURG SYN 9.0  PF PI (GLOVE) ×1
GLOVE SURG SYN 9.0 PF PI (GLOVE) ×1 IMPLANT
GOWN SRG 2XL LVL 4 RGLN SLV (GOWNS) ×1 IMPLANT
GOWN STRL NON-REIN 2XL LVL4 (GOWNS) ×1
GOWN STRL REUS W/ TWL LRG LVL3 (GOWN DISPOSABLE) ×1 IMPLANT
GOWN STRL REUS W/ TWL XL LVL3 (GOWN DISPOSABLE) ×1 IMPLANT
GOWN STRL REUS W/TWL LRG LVL3 (GOWN DISPOSABLE) ×1
GOWN STRL REUS W/TWL XL LVL3 (GOWN DISPOSABLE) ×1
HOLDER FOLEY CATH W/STRAP (MISCELLANEOUS) ×2 IMPLANT
HOOD PEEL AWAY FLYTE STAYCOOL (MISCELLANEOUS) ×4 IMPLANT
INSERT TIBIAL FLEX SZ2 LEFT (Insert) ×2 IMPLANT
KIT PREVENA INCISION MGT20CM45 (CANNISTER) ×2 IMPLANT
KIT TURNOVER KIT A (KITS) ×2 IMPLANT
NDL SAFETY ECLIPSE 18X1.5 (NEEDLE) ×1 IMPLANT
NEEDLE HYPO 18GX1.5 SHARP (NEEDLE) ×1
NEEDLE SPNL 18GX3.5 QUINCKE PK (NEEDLE) ×2 IMPLANT
NEEDLE SPNL 20GX3.5 QUINCKE YW (NEEDLE) ×2 IMPLANT
NS IRRIG 1000ML POUR BTL (IV SOLUTION) ×2 IMPLANT
PACK TOTAL KNEE (MISCELLANEOUS) ×2 IMPLANT
PAD WRAPON POLAR KNEE (MISCELLANEOUS) ×1 IMPLANT
PENCIL SMOKE EVACUATOR COATED (MISCELLANEOUS) ×2 IMPLANT
PULSAVAC PLUS IRRIG FAN TIP (DISPOSABLE) ×2
SCALPEL PROTECTED #10 DISP (BLADE) ×4 IMPLANT
SOL .9 NS 3000ML IRR  AL (IV SOLUTION) ×1
SOL .9 NS 3000ML IRR UROMATIC (IV SOLUTION) ×1 IMPLANT
STAPLER SKIN PROX 35W (STAPLE) ×2 IMPLANT
STEM EXTENSION 11MMX30MM (Stem) ×2 IMPLANT
SUCTION FRAZIER HANDLE 10FR (MISCELLANEOUS) ×1
SUCTION TUBE FRAZIER 10FR DISP (MISCELLANEOUS) ×1 IMPLANT
SUT DVC 2 QUILL PDO  T11 36X36 (SUTURE) ×1
SUT DVC 2 QUILL PDO T11 36X36 (SUTURE) ×1 IMPLANT
SUT ETHIBOND 2 V 37 (SUTURE) IMPLANT
SUT V-LOC 90 ABS DVC 3-0 CL (SUTURE) ×2 IMPLANT
SYR 20ML LL LF (SYRINGE) ×2 IMPLANT
SYR 50ML LL SCALE MARK (SYRINGE) ×4 IMPLANT
TIBIAL BONE MODEL LEFT (MISCELLANEOUS) ×2 IMPLANT
TIBIAL TRAY FIXED MEDACTA 0207 (Joint) ×2 IMPLANT
TIP FAN IRRIG PULSAVAC PLUS (DISPOSABLE) ×1 IMPLANT
TOWEL OR 17X26 4PK STRL BLUE (TOWEL DISPOSABLE) ×2 IMPLANT
TOWER CARTRIDGE SMART MIX (DISPOSABLE) ×2 IMPLANT
TRAY FOLEY MTR SLVR 16FR STAT (SET/KITS/TRAYS/PACK) ×2 IMPLANT
WRAPON POLAR PAD KNEE (MISCELLANEOUS) ×2

## 2020-06-21 NOTE — Transfer of Care (Signed)
Immediate Anesthesia Transfer of Care Note  Patient: Diana Deleon  Procedure(s) Performed: LEFT TOTAL KNEE ARTHROPLASTY (Left Knee)  Patient Location: PACU  Anesthesia Type:Spinal  Level of Consciousness: awake, drowsy and patient cooperative  Airway & Oxygen Therapy: Patient Spontanous Breathing  Post-op Assessment: Report given to RN and Post -op Vital signs reviewed and stable  Post vital signs: Reviewed and stable  Last Vitals:  Vitals Value Taken Time  BP    Temp    Pulse    Resp    SpO2      Last Pain:  Vitals:   06/21/20 0633  TempSrc: Tympanic  PainSc: 2          Complications: No complications documented.

## 2020-06-21 NOTE — Anesthesia Procedure Notes (Signed)
Spinal  Patient location during procedure: OR Start time: 06/21/2020 7:18 AM End time: 06/21/2020 7:35 AM Staffing Performed: anesthesiologist  Preanesthetic Checklist Completed: patient identified, IV checked, site marked, risks and benefits discussed, surgical consent, monitors and equipment checked, pre-op evaluation and timeout performed Spinal Block Patient position: sitting Prep: Betadine Patient monitoring: heart rate, continuous pulse ox, blood pressure and cardiac monitor Approach: midline Location: L4-5 Injection technique: single-shot Needle Needle type: Whitacre and Introducer  Needle gauge: 24 G Needle length: 9 cm Additional Notes Negative paresthesia. Negative blood return. Positive free-flowing CSF. Expiration date of kit checked and confirmed. Patient tolerated procedure well, without complications.

## 2020-06-21 NOTE — Anesthesia Preprocedure Evaluation (Signed)
Anesthesia Evaluation  Patient identified by MRN, date of birth, ID band Patient awake    Reviewed: Allergy & Precautions, NPO status , Patient's Chart, lab work & pertinent test results  History of Anesthesia Complications Negative for: history of anesthetic complications  Airway Mallampati: III       Dental   Pulmonary neg sleep apnea, neg COPD, Not current smoker,           Cardiovascular hypertension, Pt. on medications and Pt. on home beta blockers (-) Past MI and (-) CHF (-) dysrhythmias (-) Valvular Problems/Murmurs     Neuro/Psych neg Seizures    GI/Hepatic Neg liver ROS, GERD  Medicated and Controlled,  Endo/Other  diabetes, Type 2, Oral Hypoglycemic Agents, Insulin DependentHypothyroidism   Renal/GU negative Renal ROS     Musculoskeletal   Abdominal   Peds  Hematology   Anesthesia Other Findings   Reproductive/Obstetrics                             Anesthesia Physical Anesthesia Plan  ASA: III  Anesthesia Plan: Spinal   Post-op Pain Management:    Induction:   PONV Risk Score and Plan:   Airway Management Planned:   Additional Equipment:   Intra-op Plan:   Post-operative Plan:   Informed Consent: I have reviewed the patients History and Physical, chart, labs and discussed the procedure including the risks, benefits and alternatives for the proposed anesthesia with the patient or authorized representative who has indicated his/her understanding and acceptance.       Plan Discussed with:   Anesthesia Plan Comments:         Anesthesia Quick Evaluation

## 2020-06-21 NOTE — Op Note (Addendum)
Date June 22, 2019  Time 9:30 AM   PATIENT:   Diana Deleon   PRE-OPERATIVE DIAGNOSIS:  Primary localized osteoarthritis of left knee   POST-OPERATIVE DIAGNOSIS:  Same   PROCEDURE:  Procedure(s): Left TOTAL KNEE ARTHROPLASTY   SURGEON: Laurene Footman, MD   ASSISTANTS: Talmage Coin, RNFA   ANESTHESIA:   spinal   EBL:  100   BLOOD ADMINISTERED:none   DRAINS: Incisional wound VAC    LOCAL MEDICATIONS USED:  MARCAINE    and OTHER Exparel and morphine   SPECIMEN:  No Specimen   DISPOSITION OF SPECIMEN:  N/A   COUNTS:  YES   TOURNIQUET:   67 minutes at 300 mm Hg   IMPLANTS: Medacta  GMK sphere system with  3 left femur, 2 left tibia with short stem and  17 mm insert.  Size  1 patella, all components cemented.   DICTATION: Viviann Spare Dictation   patient was brought to the operating room and spinal anesthesia was obtained.  After prepping and draping the  left leg in sterile fashion, and after patient identification and timeout procedures were completed, tourniquet was raised  and midline skin incision was made followed by medial parapatellar arthrotomy with  severe medial compartment osteoarthritis, severe patellofemoral arthritis and  moderate lateral compartment arthritis, partial synovectomy was also carried out.   The ACL and PCL and fat pad were excised along with anterior horns of the meniscus. The proximal tibia cutting guide from  the [facility] system was applied and the proximal tibia cut carried out.  The distal femoral cut was carried out in a similar fashion     The  3 femoral cutting guide applied with anterior posterior and chamfer cuts made.  The posterior horns of the menisci were removed at this point.   Injection of the above medication was carried out after the femoral and tibial cuts were carried out.  The  #2 baseplate trial was placed pinned into position and proximal tibial preparation carried out with drilling hand reaming and the keel punch followed by placement of the   3 femur and sizing the tibial insert size   17 millimeter gave the best fit with stability and full extension.  The distal femoral drill holes were made in the notch cut for the trochlear groove was then carried out with trials were then removed the patella was cut using the patellar cutting guide and it sized to a size  1 after drill holes have been made  The knee was irrigated with pulsatile lavage and the bony surfaces dried the tibial component was cemented into place first.  Excess cement was removed and the polyethylene insert placed with a torque screw placed with a torque screwdriver tightened.  The distal femoral component was placed and the knee was held in extension as the patellar button was clamped into place.  After the cement was set, excess cement was removed and the knee was again irrigated thoroughly thoroughly irrigated.  The tourniquet was let down and hemostasis checked with electrocautery. The arthrotomy was repaired with a heavy Quill suture,  followed by 3-0 V lock subcuticular closure, skin staples followed by incisional wound VAC and Polar Care.[name] Kitchen   PLAN OF CARE: Admit for overnight observation   PATIENT DISPOSITION:  PACU - hemodynamically stable.

## 2020-06-21 NOTE — H&P (Signed)
Chief Complaint: No chief complaint on file.  Diana Deleon is a 59 y.o. female who presents today for history and physical for left total knee replacement with Dr. Rudene Christians on 06/21/2020. Patient has constant severe pain along the left knee along the medial joint line occasionally radiating into the proximal medial aspect of the tibia. X-rays from March 2020 show complete loss of joint space in the medial compartment with varus deformity. She has had Synvisc injections back in June 2020 with very little relief. Cortisone injections back in March 2020 provided her with very little relief. She gets the most relief with meloxicam 7.5 mg daily, recently ran out of this medication and is noting increased pain. Despite meloxicam she still has limitations in her ADLs and decreased quality of life. She is unable to play and care for her grandchildren due to not being able to keep up due to left knee pain. Knee will buckle and give way. Patient is unable to walk 1 city block. She is unable to go shopping due to pain. She denies any recent trauma or injury. No swelling or effusion. Patient has seen Dr. Rudene Christians, discussed total knee arthroplasty and would like to proceed with TKA.  Patient has a history of diabetes.   No history of blood clots.  Past Medical History: Past Medical History:  Diagnosis Date  . Breast cancer (CMS-HCC)  right  . Hypertension  . Psoriasis  . Thyroid disease   Past Surgical History: Past Surgical History:  Procedure Laterality Date  . BREAST SURGERY  . CHOLECYSTECTOMY 2003  . HYSTERECTOMY  . MASTECTOMY   Past Family History: Family History  Problem Relation Age of Onset  . Stroke Mother   Medications: Current Outpatient Medications Ordered in Epic  Medication Sig Dispense Refill  . albuterol (VENTOLIN HFA) 90 mcg/actuation inhaler Ventolin HFA 90 mcg/actuation aerosol inhaler INL 2 PFS ITL Q 4 H PRF WHZ OR SOB OR COUGH  . alendronate (FOSAMAX) 70 MG tablet alendronate 70 mg  tablet TK 1 T PO Q 7 DAYS WITH A FULL GLASS OF WATER OES  . anastrozole (ARIMIDEX) 1 mg tablet TAKE 1 TABLET (1 MG TOTAL) BY MOUTH DAILY.  [name] Kitchen aspirin 81 MG EC tablet Take 81 mg by mouth once daily.  . betamethasone dipropionate, augmented, (DIPROLENE) 0.05 % ointment Apply topically 2 (two) times daily. Do not exceed 2 weeks of treatment or 45 g/week.  . calcium carbonate-vitamin D3 600 mg(1,500mg ) -800 unit Tab Take by mouth  . clobetasoL (CORMAX) 0.05 % external solution APP AA ON SCALP BID UNTIL CLEAR  . glipiZIDE (GLUCOTROL XL) 10 MG XL tablet  . insulin DETEMIR (LEVEMIR FLEXTOUCH U-100 INSULN) pen injector (concentration 100 units/mL) Levemir FlexTouch U-100 Insulin 100 unit/mL (3 mL) subcutaneous pen INJECT 30 UNITS INTO THE SKIN DAILY.  [name] Kitchen levothyroxine (SYNTHROID) 50 MCG tablet levothyroxine 50 mcg tablet  . losartan-hydrochlorothiazide (HYZAAR) 100-25 mg tablet losartan 100 mg-hydrochlorothiazide 25 mg tablet  . meloxicam (MOBIC) 7.5 MG tablet TAKE 1 TABLET BY MOUTH EVERY DAY 30 tablet 0  . metoprolol succinate (TOPROL-XL) 50 MG XL tablet TK 1 T PO QD WITH OR IMMEDIATELY FOLLOWING A MEAL  . omeprazole (PRILOSEC OTC) 20 MG tablet Take 20 mg by mouth once daily.  . rosuvastatin (CRESTOR) 5 MG tablet  . valACYclovir (VALTREX) 500 MG tablet Take by mouth Take 1 tablet (500 mg total) by mouth 2 (two) times daily. For 5 days for an outbreak.  . venlafaxine (EFFEXOR) 75 MG tablet Take by mouth.  No current Epic-ordered facility-administered medications on file.   Allergies: Allergies  Allergen Reactions  . Amlodipine Swelling  . Metformin Other (See Comments) and Diarrhea  Diarrhea (even XR)    Review of Systems:  A comprehensive 14 point ROS was performed, reviewed by me today, and the pertinent orthopaedic findings are documented in the HPI.  Exam: LMP (LMP Unknown)  General/Constitutional: The patient appears to be well-nourished, well-developed, and in no acute  distress. Neuro/Psych: Normal mood and affect, oriented to person, place and time. Eyes: Non-icteric. Pupils are equal, round, and reactive to light, and exhibit synchronous movement. ENT: Unremarkable. Lymphatic: No palpable adenopathy. Respiratory: Non-labored breathing Cardiovascular: No edema, swelling or tenderness, except as noted in detailed exam. Integumentary: No impressive skin lesions present, except as noted in detailed exam. Musculoskeletal: Unremarkable, except as noted in detailed exam.  General: Well developed, well nourished 59 y.o. female in no apparent distress. Normal affect. Normal communication. Patient answers questions appropriately. The patient has a mild antalgic gait. No assistive devices.  HEENT: Head normocephalic, atraumatic, PERRL.   Abdomen: Soft, non tender, non distended, Bowel sounds present.  Heart: Examination of the heart reveals regular, rate, and rhythm. There is no murmur noted on ascultation. There is a normal apical pulse.  Lungs: Lungs are clear to auscultation. There is no wheeze, rhonchi, or crackles. There is normal expansion of bilateral chest walls.   Left lower Extremities: Examination of the left lower extremity reveals no bony abnormality, no edema, no effusion and no ecchymosis. There is mild varus abnormality. Varus abnormality not passively corrected. The patient is non-tender along the lateral joint line, and is tender along the medial joint line. The patient has -5 to 115 degrees range of motion. There is no discomfort with range of motion exercises. There is no retropatellar discomfort. The patient has a negative patella stretch test. The patient has a negative varus stress test and a negative valgus stress test, in looking for stability. The patient has a negative Lachman's test.  Vascular: The patient has a negative Bevelyn Buckles' test bilaterally. The patient had a normal dorsalis pedis and posterior tibial pulse. There is normal skin  warmth. There is normal capillary refill bilaterally.   Neurologic: The patient has a negative straight leg raise. The patient has normal muscle strength testing for the quadriceps, calves, ankle dorsiflexion, ankle plantarflexion, and extensor hallicus longus. The patient has sensation that is intact to light touch. The deep tendon reflexes are normal at the patella and achilles. No clonus is noted.   AP lateral and sunrise views of the left knee are reviewed by me today from March 2020. Impression: Patient has complete loss of joint space in the medial compartment with varus deformity noted. There is spurring along the lateral femoral condyle and lateral tibial plateau. No evidence of acute bony abnormality or effusion. Moderate patellofemoral spurring and patellofemoral subchondral sclerosing.  EXAM:  CT OF THE LEFT KNEE WITHOUT CONTRAST   TECHNIQUE:  Multidetector CT imaging of the left knee was performed according to  the standard protocol. Multiplanar CT image reconstructions were  also generated.   COMPARISON: None.   FINDINGS:  Bones/Joint/Cartilage   Left knee: No acute fracture. No dislocation. Tricompartmental  osteoarthritis, most severe within the medial compartment manifested  by joint space loss, subchondral sclerosis, and marginal osteophyte  formation. There is an 11 x 8 mm ossified intra-articular body the  anterior aspect of the intercondylar notch. Small-moderate knee  joint effusion. Small Baker's cyst.   Left  hip: Limited axial images through the left hip demonstrate no  acutefracture or dislocation. Mild joint space narrowing. No  evidence of femoral head avascular necrosis. No soft tissue  abnormality.   Left ankle: Limited axial images through the left ankle demonstrate  no acute fracture or dislocation. No talar dome osteochondral lesion  is seen. Degenerative changes within the visualized midfoot and  forefoot, most pronounced at the naviculocuneiform  joints and the  second and third tarsometatarsal joints. No soft tissue abnormality.   Ligaments   Suboptimally assessed by CT.   Muscles and Tendons   Grossly intact by CT. No muscular atrophy or fatty infiltration is  seen.   Soft tissues   No soft tissue fluid collection or hematoma. Colonic diverticulosis  partially visualized.   IMPRESSION:  1. Tricompartmental osteoarthritis of the left knee, most severe  within the medial compartment.  2. Small-moderate left knee joint effusion. Small Baker's cyst.  3. Degenerative changes within the left midfoot and forefoot, most  pronounced at the second and third tarsometatarsal joints.  4. Colonic diverticulosis partially visualized.    Impression: Primary osteoarthritis of left knee [M17.12] Primary osteoarthritis of left knee (primary encounter diagnosis)  Plan:  1. Risks, benefits, complications of a left total knee arthroplasty have been discussed with the patient. Patient has agreed and consented to procedure with Dr. Hessie Knows on 06/21/2020.  This note was generated in part with voice recognition software and I apologize for any typographical errors that were not detected and corrected.  Feliberto Gottron MPA-C  Reviewed paper H+P, will be scanned into chart. No changes noted.

## 2020-06-21 NOTE — Plan of Care (Signed)

## 2020-06-21 NOTE — Evaluation (Signed)
Physical Therapy Evaluation Patient Details Name: Diana Deleon MRN: [id number] DOB: 1961/11/08 Today's Date: 06/21/2020   History of Present Illness  Pt is a 59 y.o. female s/p L TKA 7/6 secondary to OA.  PMH includes, htn, DM, R breast CA, h/o mastectomy, psoriasis.  Clinical Impression  Prior to hospital admission, pt was independent with ambulation; lives with her husband on main level of home with 2 STE (B railings).  Currently pt is min assist semi-supine to sitting edge of bed, min assist with transfers, and CGA to take a few steps bed to recliner with RW.  Able to perform L LE SLR with minimal assist.  Pt reporting tingling in LE's but demonstrated appropriate strength to perform above functional mobility.  1-2/10 L knee pain begining and end of PT session.  Pt would benefit from skilled PT to address noted impairments and functional limitations (see below for any additional details).  Upon hospital discharge, pt would benefit from [place].    Follow Up Recommendations Home health PT    Equipment Recommendations   (pt has RW and BSC at home already)    Recommendations for Other Services       Precautions / Restrictions Precautions Precautions: Fall;Knee Precaution Comments: wound vac Restrictions Weight Bearing Restrictions: Yes LLE Weight Bearing: Weight bearing as tolerated      Mobility  Bed Mobility Overal bed mobility: Needs Assistance Bed Mobility: Supine to Sit     Supine to sit: Min assist;HOB elevated     General bed mobility comments: assist for L LE; vc's for technique  Transfers Overall transfer level: Needs assistance Equipment used: Rolling walker (2 wheeled) Transfers: Sit to/from Stand Sit to Stand: Min assist         General transfer comment: min assist to stand up to walker from bed and control descent sitting in recliner; vc's for UE/LE placement and overall technique required  Ambulation/Gait Ambulation/Gait assistance: Min guard Gait Distance  (Feet): 3 Feet (bed to recliner) Assistive device: Rolling walker (2 wheeled) Gait Pattern/deviations: Step-to pattern;Antalgic Gait velocity: decreased   General Gait Details: vc's to increase UE support through RW to offweight L LE; vc's for stepping and walker sequencing  Stairs            Wheelchair Mobility    Modified Rankin (Stroke Patients Only)       Balance Overall balance assessment: Needs assistance Sitting-balance support: No upper extremity supported;Feet supported Sitting balance-Leahy Scale: Good Sitting balance - Comments: steady sitting reaching within BOS   Standing balance support: Single extremity supported Standing balance-Leahy Scale: Poor Standing balance comment: pt requiring at least single UE support for static standing balance                             Pertinent Vitals/Pain Pain Assessment: 0-10 Pain Score: 2  Pain Location: L knee Pain Descriptors / Indicators: Sore Pain Intervention(s): Limited activity within patient's tolerance;Monitored during session;Repositioned;Other (comment) (polar care applied and activated)    Home Living Family/patient expects to be discharged to:: Private residence Living Arrangements: Spouse/significant other Available Help at Discharge: Family Type of Home: House Home Access: Stairs to enter Entrance Stairs-Rails: Right;Left;Can reach both Entrance Stairs-Number of Steps: 2 [place]: Two level;Able to live on main level with bedroom/bathroom Home Equipment: Gilford Rile - 2 wheels;Walker - 4 wheels;Bedside commode;Wheelchair - manual;Hand held shower head;Grab bars - tub/shower;Electric scooter      Prior Function Level of Independence: Independent  Hand Dominance        Extremity/Trunk Assessment   Upper Extremity Assessment Upper Extremity Assessment: Overall WFL for tasks assessed    Lower Extremity Assessment Lower Extremity Assessment: LLE  deficits/detail;RLE deficits/detail RLE Deficits / Details: at least 3/5 hip flexion, knee flexion/extension, and DF/PF AROM; at least 3/5 SLR LLE Deficits / Details: hip flexion at least 3/5 AROM; DF/PF at least 3/5 AROM; min assist for SLR L LE LLE: Unable to fully assess due to pain    Cervical / Trunk Assessment Cervical / Trunk Assessment: Normal  Communication   Communication: No difficulties  Cognition Arousal/Alertness: Awake/alert Behavior During Therapy: WFL for tasks assessed/performed Overall Cognitive Status: Within Functional Limits for tasks assessed                                        General Comments General comments (skin integrity, edema, etc.): L knee dressing and polar care in place.  Nursing cleared pt for participation in physical therapy.  Pt agreeable to PT session.  Pt's husband present entire session.    Exercises Total Joint Exercises Ankle Circles/Pumps: AROM;Strengthening;Both;10 reps;Supine Quad Sets: AROM;Strengthening;Both;10 reps;Supine Short Arc Quad: AAROM;Strengthening;Left;10 reps;Supine Heel Slides: AAROM;Strengthening;Left;10 reps;Supine Hip ABduction/ADduction: AAROM;Strengthening;Left;10 reps;Supine Straight Leg Raises: AAROM;Strengthening;Left;10 reps;Supine Goniometric ROM: L knee AROM 0-65 degrees   Assessment/Plan    PT Assessment Patient needs continued PT services  PT Problem List Decreased strength;Decreased range of motion;Decreased activity tolerance;Decreased balance;Decreased mobility;Decreased knowledge of use of DME;Decreased knowledge of precautions;Impaired sensation;Pain;Decreased skin integrity       PT Treatment Interventions DME instruction;Gait training;Stair training;Functional mobility training;Therapeutic activities;Therapeutic exercise;Balance training;Patient/family education    PT Goals (Current goals can be found in the Care Plan section)  Acute Rehab PT Goals Patient Stated Goal: to  improve mobility and pain PT Goal Formulation: With patient Time For Goal Achievement: 07/05/20 Potential to Achieve Goals: Good    Frequency BID   Barriers to discharge        Co-evaluation               AM-PAC PT "6 Clicks" Mobility  Outcome Measure Help needed turning from your back to your side while in a flat bed without using bedrails?: A Little Help needed moving from lying on your back to sitting on the side of a flat bed without using bedrails?: A Little Help needed moving to and from a bed to a chair (including a wheelchair)?: A Little Help needed standing up from a chair using your arms (e.g., wheelchair or bedside chair)?: A Little Help needed to walk in hospital room?: A Little Help needed climbing 3-5 steps with a railing? : A Little 6 Click Score: 18    End of Session Equipment Utilized During Treatment: Gait belt Activity Tolerance: Patient tolerated treatment well Patient left: in chair;with call bell/phone within reach;with chair alarm set;with family/visitor present;with SCD's reapplied;Other (comment) (B heels floating; L heel floating via towel roll; polar care in place and activated) Nurse Communication: Mobility status;Precautions;Weight bearing status;Other (comment) (pt's pain status) PT Visit Diagnosis: Other abnormalities of gait and mobility (R26.89);Muscle weakness (generalized) (M62.81);Difficulty in walking, not elsewhere classified (R26.2);Pain Pain - Right/Left: Left Pain - part of body: Knee    Time: 5409-8119 PT Time Calculation (min) (ACUTE ONLY): 43 min   Charges:   PT Evaluation $PT Eval Low Complexity: 1 Low PT Treatments $Therapeutic Exercise: 8-22 mins $Therapeutic Activity: 8-22  mins       Leitha Bleak, PT 06/21/20, 5:01 PM

## 2020-06-22 ENCOUNTER — Encounter: Payer: Self-pay | Admitting: Orthopedic Surgery

## 2020-06-22 DIAGNOSIS — L219 Seborrheic dermatitis, unspecified: Secondary | ICD-10-CM | POA: Diagnosis present

## 2020-06-22 DIAGNOSIS — Z90710 Acquired absence of both cervix and uterus: Secondary | ICD-10-CM | POA: Diagnosis not present

## 2020-06-22 DIAGNOSIS — M1712 Unilateral primary osteoarthritis, left knee: Secondary | ICD-10-CM | POA: Diagnosis present

## 2020-06-22 DIAGNOSIS — Z791 Long term (current) use of non-steroidal anti-inflammatories (NSAID): Secondary | ICD-10-CM | POA: Diagnosis not present

## 2020-06-22 DIAGNOSIS — Z9049 Acquired absence of other specified parts of digestive tract: Secondary | ICD-10-CM | POA: Diagnosis not present

## 2020-06-22 DIAGNOSIS — M7120 Synovial cyst of popliteal space [Baker], unspecified knee: Secondary | ICD-10-CM | POA: Diagnosis present

## 2020-06-22 DIAGNOSIS — Z7982 Long term (current) use of aspirin: Secondary | ICD-10-CM | POA: Diagnosis not present

## 2020-06-22 DIAGNOSIS — E119 Type 2 diabetes mellitus without complications: Secondary | ICD-10-CM | POA: Diagnosis present

## 2020-06-22 DIAGNOSIS — Z79890 Hormone replacement therapy (postmenopausal): Secondary | ICD-10-CM | POA: Diagnosis not present

## 2020-06-22 DIAGNOSIS — K219 Gastro-esophageal reflux disease without esophagitis: Secondary | ICD-10-CM | POA: Diagnosis present

## 2020-06-22 DIAGNOSIS — Z794 Long term (current) use of insulin: Secondary | ICD-10-CM | POA: Diagnosis not present

## 2020-06-22 DIAGNOSIS — Z6835 Body mass index (BMI) 35.0-35.9, adult: Secondary | ICD-10-CM | POA: Diagnosis not present

## 2020-06-22 DIAGNOSIS — Z853 Personal history of malignant neoplasm of breast: Secondary | ICD-10-CM | POA: Diagnosis not present

## 2020-06-22 DIAGNOSIS — E876 Hypokalemia: Secondary | ICD-10-CM | POA: Diagnosis present

## 2020-06-22 DIAGNOSIS — L409 Psoriasis, unspecified: Secondary | ICD-10-CM | POA: Diagnosis present

## 2020-06-22 DIAGNOSIS — M25462 Effusion, left knee: Secondary | ICD-10-CM | POA: Diagnosis present

## 2020-06-22 DIAGNOSIS — I10 Essential (primary) hypertension: Secondary | ICD-10-CM | POA: Diagnosis present

## 2020-06-22 DIAGNOSIS — M19071 Primary osteoarthritis, right ankle and foot: Secondary | ICD-10-CM | POA: Diagnosis present

## 2020-06-22 DIAGNOSIS — Z87440 Personal history of urinary (tract) infections: Secondary | ICD-10-CM | POA: Diagnosis not present

## 2020-06-22 DIAGNOSIS — E039 Hypothyroidism, unspecified: Secondary | ICD-10-CM | POA: Diagnosis present

## 2020-06-22 DIAGNOSIS — M25562 Pain in left knee: Secondary | ICD-10-CM | POA: Diagnosis present

## 2020-06-22 DIAGNOSIS — K5730 Diverticulosis of large intestine without perforation or abscess without bleeding: Secondary | ICD-10-CM | POA: Diagnosis present

## 2020-06-22 DIAGNOSIS — Z8619 Personal history of other infectious and parasitic diseases: Secondary | ICD-10-CM | POA: Diagnosis not present

## 2020-06-22 DIAGNOSIS — Z9010 Acquired absence of unspecified breast and nipple: Secondary | ICD-10-CM | POA: Diagnosis not present

## 2020-06-22 LAB — CBC
HCT: 35.1 % — ABNORMAL LOW (ref 36.0–46.0)
Hemoglobin: 12.7 g/dL (ref 12.0–15.0)
MCH: 31.4 pg (ref 26.0–34.0)
MCHC: 36.2 g/dL — ABNORMAL HIGH (ref 30.0–36.0)
MCV: 86.7 fL (ref 80.0–100.0)
Platelets: 233 10*3/uL (ref 150–400)
RBC: 4.05 MIL/uL (ref 3.87–5.11)
RDW: 12.7 % (ref 11.5–15.5)
WBC: 14.4 10*3/uL — ABNORMAL HIGH (ref 4.0–10.5)
nRBC: 0 % (ref 0.0–0.2)

## 2020-06-22 LAB — BASIC METABOLIC PANEL
Anion gap: 7 (ref 5–15)
BUN: 22 mg/dL — ABNORMAL HIGH (ref 6–20)
CO2: 27 mmol/L (ref 22–32)
Calcium: 8.3 mg/dL — ABNORMAL LOW (ref 8.9–10.3)
Chloride: 102 mmol/L (ref 98–111)
Creatinine, Ser: 0.9 mg/dL (ref 0.44–1.00)
GFR calc Af Amer: >60 mL/min (ref >60)
GFR calc non Af Amer: >60 mL/min (ref >60)
Glucose, Bld: 226 mg/dL — ABNORMAL HIGH (ref 70–99)
Potassium: 3.9 mmol/L (ref 3.5–5.1)
Sodium: 136 mmol/L (ref 135–145)

## 2020-06-22 LAB — GLUCOSE, CAPILLARY
Glucose-Capillary: 196 mg/dL — ABNORMAL HIGH (ref 70–99)
Glucose-Capillary: 234 mg/dL — ABNORMAL HIGH (ref 70–99)
Glucose-Capillary: 157 mg/dL — ABNORMAL HIGH (ref 70–99)
Glucose-Capillary: 145 mg/dL — ABNORMAL HIGH (ref 70–99)

## 2020-06-22 MED ORDER — TRAMADOL HCL 50 MG PO TABS: 50.0000 mg | ORAL_TABLET | Freq: Four times a day (QID) | ORAL | 0 refills | Status: DC

## 2020-06-22 MED ORDER — POTASSIUM CHLORIDE 20 MEQ PO PACK
40.0000 meq | PACK | Freq: Once | ORAL | Status: AC
  Administered 2020-06-22: 40 meq via ORAL
  Filled 2020-06-22: qty 2

## 2020-06-22 MED ORDER — HYDROCODONE-ACETAMINOPHEN 7.5-325 MG PO TABS: 1.0000 | ORAL_TABLET | ORAL | 0 refills | Status: DC | PRN

## 2020-06-22 MED ORDER — ENOXAPARIN SODIUM 40 MG/0.4ML ~~LOC~~ SOLN
40.0000 mg | SUBCUTANEOUS | 0 refills | Status: DC
Start: 2020-06-22 — End: 2020-08-03

## 2020-06-22 NOTE — Anesthesia Postprocedure Evaluation (Signed)
Anesthesia Post Note  Patient: Diana Deleon  Procedure(s) Performed: LEFT TOTAL KNEE ARTHROPLASTY (Left Knee)  Patient location during evaluation: Nursing Unit Anesthesia Type: Spinal Level of consciousness: awake, awake and alert, oriented and patient cooperative Pain management: pain level controlled Vital Signs Assessment: post-procedure vital signs reviewed and stable Respiratory status: spontaneous breathing and nonlabored ventilation Cardiovascular status: stable Postop Assessment: no headache, no backache, patient able to bend at knees, adequate PO intake and no apparent nausea or vomiting Anesthetic complications: no   No complications documented.   Last Vitals:  Vitals:   06/22/20 0324 06/22/20 0824  BP: 123/77 139/61  Pulse: 74 62  Resp: 16 18  Temp: 36.5 C 36.7 C  SpO2: 94% 96%    Last Pain:  Vitals:   06/22/20 0904  TempSrc:   PainSc: 3                  Anaiz Qazi,  [facility]

## 2020-06-22 NOTE — Evaluation (Signed)
Occupational Therapy Evaluation Patient Details Name: Diana Deleon MRN: [id number] DOB: September 22, 1961 Today's Date: 06/22/2020    History of Present Illness Pt is a 59 y.o. female s/p L TKA 7/6 secondary to OA.  PMH includes, htn, DM, R breast CA, h/o mastectomy, psoriasis.   Clinical Impression   Pt seen for OT evaluation this date, POD#1 from above surgery. Pt was independent in all ADL prior to surgery, however having increased pain limiting her participation in meaningful occupations. Pt is eager to return to PLOF with less pain and improved safety and independence. Dtr present and engaged throughout session. Pt currently requires minimal assist for LLE LB dressing while in seated position due to pain and limited AROM of L knee. Pt/dtr instructed in polar care mgt, falls prevention strategies, home/routines modifications, DME/AE for LB bathing and dressing tasks, and compression stocking mgt. Handout provided. Both pt and dtr verbalized understanding. Pt has good family support and set up/DME at home. Do not currently anticipate any additional skilled OT needs while hospitalized and following this hospitalization. Will sign off.     Follow Up Recommendations  No OT follow up    Equipment Recommendations  None recommended by OT    Recommendations for Other Services       Precautions / Restrictions Precautions Precautions: Fall;Knee Precaution Comments: wound vac Restrictions Weight Bearing Restrictions: Yes LLE Weight Bearing: Weight bearing as tolerated      Mobility Bed Mobility               General bed mobility comments: deferred, up in recliner  Transfers Overall transfer level: Needs assistance Equipment used: Rolling walker (2 wheeled) Transfers: Sit to/from Stand Sit to Stand: Min guard              Balance Overall balance assessment: Needs assistance Sitting-balance support: No upper extremity supported;Feet supported Sitting balance-Leahy Scale: Good      Standing balance support: Bilateral upper extremity supported Standing balance-Leahy Scale: Good                             ADL either performed or assessed with clinical judgement   ADL Overall ADL's : Needs assistance/impaired                                       General ADL Comments: Min A for LLE ADL, Mod I for RLE LB ADL, CGA for ADL transfer, dtr able to provide needed level of assist     Vision Baseline Vision/History: Wears glasses Wears Glasses: At all times Patient Visual Report: No change from baseline       Perception     Praxis      Pertinent Vitals/Pain Pain Assessment: 0-10 Pain Score: 2  Pain Location: L knee Pain Descriptors / Indicators: Sore Pain Intervention(s): Limited activity within patient's tolerance;Monitored during session;Premedicated before session;Repositioned;Ice applied     Hand Dominance     Extremity/Trunk Assessment Upper Extremity Assessment Upper Extremity Assessment: Overall WFL for tasks assessed   Lower Extremity Assessment Lower Extremity Assessment: Defer to PT evaluation;LLE deficits/detail LLE Deficits / Details: expected post-op strength/ROM deficits   Cervical / Trunk Assessment Cervical / Trunk Assessment: Normal   Communication Communication Communication: No difficulties   Cognition Arousal/Alertness: Awake/alert Behavior During Therapy: WFL for tasks assessed/performed Overall Cognitive Status: Within Functional Limits for tasks assessed  General Comments       Exercises Other Exercises Other Exercises: Pt/dtr instructed in falls prevention, pet care considerations, home/routines modifications, AE/DME for ADL, compression stocking mgt, and polar care mgt; handout provided; both verbalized understanding   Shoulder Instructions      Home Living Family/patient expects to be discharged to:: Private residence Living  Arrangements: Spouse/significant other Available Help at Discharge: Family Type of Home: House Home Access: Stairs to enter Technical brewer of Steps: 2 Entrance Stairs-Rails: Right;Left;Can reach both Home Layout: Two level;Able to live on main level with bedroom/bathroom     Bathroom Shower/Tub: Occupational psychologist: Standard (and has raised toilet)     Home Equipment: Walker - 2 wheels;Walker - 4 wheels;Bedside commode;Wheelchair - manual;Hand held shower head;Grab bars - tub/shower;Electric scooter;Shower seat;Adaptive equipment Adaptive Equipment: Reacher        Prior Functioning/Environment Level of Independence: Independent                 OT Problem List: Pain;Decreased strength;Impaired balance (sitting and/or standing);Decreased knowledge of use of DME or AE      OT Treatment/Interventions:      OT Goals(Current goals can be found in the care plan section) Acute Rehab OT Goals Patient Stated Goal: to improve mobility and pain OT Goal Formulation: All assessment and education complete, DC therapy  OT Frequency:     Barriers to D/C:            Co-evaluation              AM-PAC OT "6 Clicks" Daily Activity     Outcome Measure Help from another person eating meals?: None Help from another person taking care of personal grooming?: None Help from another person toileting, which includes using toliet, bedpan, or urinal?: A Little Help from another person bathing (including washing, rinsing, drying)?: A Little Help from another person to put on and taking off regular upper body clothing?: None Help from another person to put on and taking off regular lower body clothing?: A Little 6 Click Score: 21   End of Session    Activity Tolerance: Patient tolerated treatment well Patient left: in chair;with call bell/phone within reach;with chair alarm set;with family/visitor present (polar care in place)  OT Visit Diagnosis: Other  abnormalities of gait and mobility (R26.89);Pain Pain - Right/Left: Left Pain - part of body: Knee                Time: 7353-2992 OT Time Calculation (min): 17 min Charges:  OT General Charges $OT Visit: 1 Visit OT Evaluation $OT Eval Low Complexity: 1 Low OT Treatments $Self Care/Home Management : 8-22 mins  Jeni Salles, MPH, MS, OTR/L ascom [phone] 06/22/20, 10:27 AM

## 2020-06-22 NOTE — Discharge Summary (Signed)
Physician Discharge Summary  Patient ID: Diana Deleon MRN: [id number] DOB/AGE: 1961/11/09 59 y.o.  Admit date: 06/21/2020 Discharge date: 06/23/20  Admission Diagnoses:  S/P TKR (total knee replacement) using cement, left [Z96.652]   Discharge Diagnoses: Patient Active Problem List   Diagnosis Date Noted  . S/P TKR (total knee replacement) using cement, left 06/21/2020  . Hyperlipidemia associated with type 2 diabetes mellitus ([place]) 11/02/2018  . Low back pain 03/21/2018  . Osteopenia 01/05/2018  . Aromatase inhibitor use 12/25/2017  . Psoriasis 11/02/2016  . Colon cancer screening 11/02/2016  . Class 2 severe obesity due to excess calories with serious comorbidity and body mass index (BMI) of 36.0 to 36.9 in adult [facility]) 10/31/2015  . History of breast cancer 10/25/2014  . Type 2 diabetes mellitus with hyperglycemia ([place]) 07/14/2013  . Routine general medical examination at a health care facility 10/12/2012  . Hypothyroidism 06/03/2012  . Overactive bladder 05/27/2012  . HSV infection 07/07/2010  . URINARY URGENCY 07/15/2009  . LOW BACK PAIN, CHRONIC 05/17/2008  . HEADACHE, CHRONIC 05/17/2008  . Essential hypertension 02/12/2008    Past Medical History:  Diagnosis Date  . Anemia    "off and on"  . Arthritis    "right toes" (03/24/2015)  . Cancer of right breast ([place]) 2015  . Cyst of cystic duct    chest- central location, taking Cephalexin currently  . Diabetes mellitus without complication ([place])   . Fibroids    hyst. 2006  . GERD (gastroesophageal reflux disease)   . History of abnormal Pap smear    has had leep in past  . History of cold sores    has used valtrex in past  . History of recurrent UTIs   . Hypertension   . Hypothyroidism   . Intermittent vertigo   . Seborrheic dermatitis    local on R scalp and in ear (failed mult meds- uses steroid spray)  . Thyroid disease    Transfusion: None.   Consultants (if any):   Discharged Condition:  Improved  Hospital Course: Adesuwa Osgood is an 59 y.o. female who was admitted 06/21/2020 with a diagnosis of primary osteoarthritis of the left knee and went to the operating room on 06/21/2020 and underwent the above named procedures.    Surgeries: Procedure(s): LEFT TOTAL KNEE ARTHROPLASTY on 06/21/2020 Patient tolerated the surgery well. Taken to PACU where she was stabilized and then transferred to the orthopedic floor.  Started on Lovenox 30mg  q 12 hrs. Foot pumps applied bilaterally at 80 mm. Heels elevated on bed with rolled towels. No evidence of DVT. Negative Homan. Physical therapy started on day #1 for gait training and transfer. OT started day #1 for ADL and assisted devices.  Patient's IV was removed on POD2.  Patient was discharged with Prevena woundvac.  Implants: Medacta GMK sphere system with 3 leftfemur, 2 lefttibia with short stem and 42mm insert. Size 1patella, all components cemented.   She was given perioperative antibiotics:  Anti-infectives (From admission, onward)   Start     Dose/Rate Route Frequency Ordered Stop   06/21/20 0945  ceFAZolin (ANCEF) IVPB 2g/100 mL premix        2 g 200 mL/hr over 30 Minutes Intravenous Every 6 hours 06/21/20 0936 06/22/20 0344   06/21/20 0936  valACYclovir (VALTREX) tablet 500 mg     Discontinue    Note to Pharmacy: For 5 days for an outbreak     500 mg Oral 2 times daily PRN 06/21/20 0936  06/21/20 0621  ceFAZolin (ANCEF) 2-4 GM/100ML-% IVPB       Note to Pharmacy: Myles Lipps   : cabinet override      06/21/20 0621 06/21/20 0757   06/21/20 0615  ceFAZolin (ANCEF) IVPB 2g/100 mL premix        2 g 200 mL/hr over 30 Minutes Intravenous On call to O.R. 06/21/20 5956 06/21/20 0741    .  She was given sequential compression devices, early ambulation, and Lovenox for DVT prophylaxis.  She benefited maximally from the hospital stay and there were no complications.    Recent vital signs:  Vitals:   06/22/20 2348  06/23/20 0404  BP: 129/85 119/82  Pulse: 72 67  Resp: 18 18  Temp: 98 F (36.7 C) 98 F (36.7 C)  SpO2: 98% 97%    Recent laboratory studies:  Lab Results  Component Value Date   HGB 12.7 06/22/2020   HGB 14.5 06/21/2020   HGB 15.6 (H) 06/21/2020   Lab Results  Component Value Date   WBC 14.4 (H) 06/22/2020   PLT 233 06/22/2020   No results found for: INR Lab Results  Component Value Date   NA 136 06/22/2020   K 3.9 06/22/2020   CL 102 06/22/2020   CO2 27 06/22/2020   BUN 22 (H) 06/22/2020   CREATININE 0.90 06/22/2020   GLUCOSE 226 (H) 06/22/2020    Discharge Medications:   Allergies as of 06/23/2020      Reactions   Metformin And Related Diarrhea   Norvasc [amlodipine Besylate] Swelling      Medication List    TAKE these medications   alendronate 70 MG tablet Commonly known as: FOSAMAX Take 1 tablet (70 mg total) by mouth every 7 (seven) days. Take with a full glass of water on an empty stomach.   anastrozole 1 MG tablet Commonly known as: ARIMIDEX TAKE 1 TABLET(1 MG) BY MOUTH DAILY What changed: See the new instructions.   aspirin EC 81 MG tablet Take 81 mg by mouth daily.   augmented betamethasone dipropionate 0.05 % ointment Commonly known as: DIPROLENE-AF Apply 1 application topically daily.   Calcium 600/Vitamin D3 600-800 MG-UNIT Tabs Generic drug: Calcium Carb-Cholecalciferol Take 2 tablets by mouth daily.   Clobetasol Propionate 0.05 % shampoo Apply 1 application topically once a week.   enoxaparin 40 MG/0.4ML injection Commonly known as: LOVENOX Inject 0.4 mLs (40 mg total) into the skin daily.   glipiZIDE 10 MG 24 hr tablet Commonly known as: GLUCOTROL XL Take 1 tablet (10 mg total) by mouth daily with breakfast.   HYDROcodone-acetaminophen 7.5-325 MG tablet Commonly known as: NORCO Take 1-2 tablets by mouth every 4 (four) hours as needed for severe pain.   insulin detemir 100 UNIT/ML FlexPen Commonly known as: LEVEMIR Inject  40 Units into the skin daily. What changed: when to take this   levothyroxine 50 MCG tablet Commonly known as: SYNTHROID TAKE 1 TABLET(50 MCG) BY MOUTH DAILY What changed:   how much to take  how to take this  when to take this  additional instructions   losartan-hydrochlorothiazide 100-25 MG tablet Commonly known as: HYZAAR Take 1 tablet by mouth daily.   meloxicam 7.5 MG tablet Commonly known as: MOBIC Take 7.5 mg by mouth daily.   methocarbamol 500 MG tablet Commonly known as: ROBAXIN Take 1 tablet (500 mg total) by mouth every 8 (eight) hours as needed for muscle spasms.   metoprolol succinate 50 MG 24 hr tablet Commonly known as: TOPROL-XL  Take 1 tablet (50 mg total) by mouth daily. Take with or immediately following a meal.   omeprazole 20 MG capsule Commonly known as: PRILOSEC TAKE 1 CAPSULE BY MOUTH DAILY BEFORE BREAKFAST What changed:   how much to take  how to take this  when to take this  additional instructions   OneTouch Delica Lancets Fine Misc Use to check blood sugar once daily and as needed (dx. E11.9)   OneTouch Verio test strip Generic drug: glucose blood USE TO CHECK BLOOD SUGAR ONCE DAILY AND AS NEEDED   rosuvastatin 5 MG tablet Commonly known as: CRESTOR Take 1 tablet (5 mg total) by mouth daily.   traMADol 50 MG tablet Commonly known as: ULTRAM Take 1 tablet (50 mg total) by mouth every 6 (six) hours.   valACYclovir 500 MG tablet Commonly known as: Valtrex Take 1 tablet (500 mg total) by mouth 2 (two) times daily. For 5 days for an outbreak What changed:   when to take this  reasons to take this  additional instructions   venlafaxine XR 75 MG 24 hr capsule Commonly known as: EFFEXOR-XR TAKE 1 CAPSULE(75 MG) BY MOUTH DAILY WITH BREAKFAST What changed: See the new instructions.            Durable Medical Equipment  (From admission, onward)         Start     Ordered   06/21/20 9833  DME Walker rolling  Once        Question Answer Comment  Walker: With 5 Inch Wheels   Patient needs a walker to treat with the following condition S/P TKR (total knee replacement) using cement, left      06/21/20 0936   06/21/20 0937  DME 3 n 1  Once        06/21/20 0936   06/21/20 0937  DME Bedside commode  Once       Question:  Patient needs a bedside commode to treat with the following condition  Answer:  S/P TKR (total knee replacement) using cement, left   06/21/20 0936          Diagnostic Studies: DG Knee 1-2 Views Left  Result Date: 06/21/2020 CLINICAL DATA:  Status post left knee replacement. EXAM: LEFT KNEE - 1-2 VIEW COMPARISON:  None. FINDINGS: Left femoral and tibial components appear to be well situated. Expected postoperative changes are noted in the soft tissues anteriorly. IMPRESSION: Status post left total knee arthroplasty. Electronically Signed   By: Marijo Conception M.D.   On: 06/21/2020 09:52    Disposition: Plan for discharge home following session with PT on 06/23/20 and pending a bowel movement.   Follow-up Information    Duanne Guess, PA-C Follow up in 14 day(s).   Specialties: Orthopedic Surgery, Emergency Medicine Why: Staple Removal Contact information: [place] [address] [phone]              Signed: Judson Roch PA-C 06/23/2020, 7:41 AM

## 2020-06-22 NOTE — TOC Initial Note (Signed)
Transition of Care [facility]) - Initial/Assessment Note    Patient Details  Name: Diana Deleon MRN: [id number] Date of Birth: 09-03-1961  [facility] of [facility]) CM/SW Contact:    Su Hilt, RN Phone Number: 06/22/2020, 1:51 PM  Clinical Narrative:                  Met with the patient to discuss DC plan and needs, she lives at home with her husband and her daughter will be helping her, She has a RW and a 3 in 1 at home and does not need additional DME, She was not approved for [facility] services,   She has an appointment for Outpatient PT and will call tomorrow to move her appointment to sooner, She stated Dr Rudene Christians is aware of not having [place] She is up to date with her PCP and can afford her medications  NO additional needs Expected Discharge Plan: Home/Self Care Barriers to Discharge: Continued Medical Work up   Patient Goals and CMS Choice Patient states their goals for this hospitalization and ongoing recovery are:: get home      Expected Discharge Plan and Services Expected Discharge Plan: Home/Self Care   Discharge Planning Services: CM Consult   Living arrangements for the past 2 months: Single Family Home                 DME Arranged: N/A         HH Arranged: NA          Prior Living Arrangements/Services Living arrangements for the past 2 months: Single Family Home Lives with:: Spouse (daughter to stay with her and help her) Patient language and need for interpreter reviewed:: Yes Do you feel safe going back to the place where you live?: Yes      Need for Family Participation in Patient Care: No (Comment) Care giver support system in place?: Yes (comment) Current home services: DME (rolling walker and 3 in 1) Criminal Activity/Legal Involvement Pertinent to Current Situation/Hospitalization: No - Comment as needed  Activities of Daily Living Home Assistive Devices/Equipment: None ADL Screening (condition at time of admission) Patient's cognitive ability  adequate to safely complete daily activities?: Yes Is the patient deaf or have difficulty hearing?: No Does the patient have difficulty seeing, even when wearing glasses/contacts?: No Does the patient have difficulty concentrating, remembering, or making decisions?: No Patient able to express need for assistance with ADLs?: Yes Does the patient have difficulty dressing or bathing?: No Independently performs ADLs?: Yes (appropriate for developmental age) Does the patient have difficulty walking or climbing stairs?: Yes Weakness of Legs: Left Weakness of Arms/Hands: None  Permission Sought/Granted   Permission granted to share information with : Yes, Verbal Permission Granted              Emotional Assessment Appearance:: Appears stated age Attitude/Demeanor/Rapport: Engaged Affect (typically observed): Appropriate Orientation: : Oriented to Self, Oriented to Place, Oriented to  Time, Oriented to Situation Alcohol / Substance Use: Not Applicable Psych Involvement: No (comment)  Admission diagnosis:  S/P TKR (total knee replacement) using cement, left [Z96.652] Patient Active Problem List   Diagnosis Date Noted  . S/P TKR (total knee replacement) using cement, left 06/21/2020  . Hyperlipidemia associated with type 2 diabetes mellitus ([place]) 11/02/2018  . Low back pain 03/21/2018  . Osteopenia 01/05/2018  . Aromatase inhibitor use 12/25/2017  . Psoriasis 11/02/2016  . Colon cancer screening 11/02/2016  . Class 2 severe obesity due to excess calories with serious  comorbidity and body mass index (BMI) of 36.0 to 36.9 in adult [facility]) 10/31/2015  . History of breast cancer 10/25/2014  . Type 2 diabetes mellitus with hyperglycemia ([place]) 07/14/2013  . Routine general medical examination at a health care facility 10/12/2012  . Hypothyroidism 06/03/2012  . Overactive bladder 05/27/2012  . HSV infection 07/07/2010  . URINARY URGENCY 07/15/2009  . LOW BACK PAIN, CHRONIC 05/17/2008  .  HEADACHE, CHRONIC 05/17/2008  . Essential hypertension 02/12/2008   PCP:  Abner Greenspan, MD Pharmacy:   [id number] [id number]: [phone]: [phone] [phone] [place] [place]Phone: [phone]: [phone]    Social Determinants of Health (SDOH) Interventions    Readmission Risk Interventions No flowsheet data found.

## 2020-06-22 NOTE — Discharge Instructions (Signed)
Diet: As you were doing prior to hospitalization   Shower:  May shower but keep the wounds dry, use an occlusive plastic wrap, NO SOAKING IN TUB.  If the bandage gets wet, change with a clean dry gauze.  Dressing:  Leave woundvac in place, home health therapy will change the woundvac to a honeycomb dressing in 7-10 days.  Activity:  Increase activity slowly as tolerated, but follow the weight bearing instructions below.  No lifting or driving for 6 weeks.  Weight Bearing:   Weight bearing as tolerated to left lower extremity  To prevent constipation: you may use a stool softener such as -  Colace (over the counter) 100 mg by mouth twice a day  Drink plenty of fluids (prune juice may be helpful) and high fiber foods Miralax (over the counter) for constipation as needed.    Itching:  If you experience itching with your medications, try taking only a single pain pill, or even half a pain pill at a time.  You may take up to 10 pain pills per day, and you can also use benadryl over the counter for itching or also to help with sleep.   Precautions:  If you experience chest pain or shortness of breath - call 911 immediately for transfer to the hospital emergency department!!  If you develop a fever greater that 101 F, purulent drainage from wound, increased redness or drainage from wound, or calf pain-Call [place]                                              Follow- Up Appointment:  Please call for an appointment to be seen in 2 weeks at [facility]

## 2020-06-22 NOTE — Progress Notes (Signed)
Physical Therapy Treatment Patient Details Name: Diana Deleon MRN: [id number] DOB: May 29, 1961 Today's Date: 06/22/2020    History of Present Illness Pt is a 59 y.o. female s/p L TKA 7/6 secondary to OA.  PMH includes, htn, DM, R breast CA, h/o mastectomy, psoriasis.    PT Comments    Pt just returned to bed from being up in recliner when therapist arrived. She agrees to PT session. Pt was able to transition from long sitting to short sit without physical assistance. While seated EOB, pt performed stretching exercises. AROM 2-88 degrees L knee. HEP handout issued in AM and then performed this session. See list of exercises performed listed below. Pt did not require assistance to perform but did require education and cueing. PT will perform stair training in the morning. Overall, pt is progressing well with PT. She will benefit from skilled HHPT at DC to address strength, ROM, and safe functional mobility deficits. Pt was in bed at conclusion of session with polar care reapplied, towel roll under LLE to promote ext (bone foam issued but unable to tolerate), and call bell in reach. RN is aware of pt's abilities.       Follow Up Recommendations  Home health PT     Equipment Recommendations  None recommended by PT    Recommendations for Other Services       Precautions / Restrictions Precautions Precautions: Fall;Knee Precaution Booklet Issued: Yes (comment) Precaution Comments: wound vac Restrictions Weight Bearing Restrictions: Yes LLE Weight Bearing: Weight bearing as tolerated    Mobility  Bed Mobility Overal bed mobility: Modified Independent Bed Mobility: Supine to Sit;Sit to Supine     Supine to sit: Supervision Sit to supine: Supervision   General bed mobility comments: Pt was able to safely progress from supine to EOB sitting without physical assisrtance. Increased time to retunr from short sit to supine.  Transfers Overall transfer level: Needs assistance Equipment  used: Rolling walker (2 wheeled) Transfers: Sit to/from Stand Sit to Stand: Supervision         General transfer comment: did not get OOB. performed ther ex EOB and in bed.   Ambulation/Gait Ambulation/Gait assistance: Supervision Gait Distance (Feet): 220 Feet Assistive device: Rolling walker (2 wheeled) Gait Pattern/deviations: Step-through pattern Gait velocity: decreased   General Gait Details: no LOB with ambulation 200 ft with RW. started with step to pattern but was able to progress to step throughout by the end of gait training.   Stairs             Wheelchair Mobility    Modified Rankin (Stroke Patients Only)       Balance Overall balance assessment: Needs assistance Sitting-balance support: No upper extremity supported;Feet supported Sitting balance-Leahy Scale: Good Sitting balance - Comments: no LOB in sitting with reaching outside BOS   Standing balance support: Bilateral upper extremity supported Standing balance-Leahy Scale: Good Standing balance comment: no LOB with static and dynamic activities.                            Cognition Arousal/Alertness: Awake/alert Behavior During Therapy: WFL for tasks assessed/performed Overall Cognitive Status: Within Functional Limits for tasks assessed                                 General Comments: Pt was A and O x 4. Cooperative and pleasant throughout.  Exercises Total Joint Exercises Ankle Circles/Pumps: AROM;Strengthening;Both;10 reps;Supine Quad Sets: AROM;Strengthening;Both;10 reps;Supine Short Arc Quad: AROM;Left;10 reps;Supine Heel Slides: AROM;Left;10 reps;Supine Hip ABduction/ADduction: AROM;Left;10 reps;Supine Straight Leg Raises: AROM;Left;5 reps Long Arc Quad: Other (comment) (unable to perform but pt did attempt) Knee Flexion: AROM;5 reps;Left Goniometric ROM: 2-88 degrees    General Comments        Pertinent Vitals/Pain Pain Assessment: 0-10 Pain  Score: 4  Pain Location: L knee Pain Descriptors / Indicators: Sore Pain Intervention(s): Limited activity within patient's tolerance;Monitored during session;Premedicated before session;Repositioned;Ice applied    Home Living                      Prior Function            PT Goals (current goals can now be found in the care plan section) Acute Rehab PT Goals Patient Stated Goal: To get home safely  Progress towards PT goals: Progressing toward goals    Frequency    BID      PT Plan Current plan remains appropriate    Co-evaluation              AM-PAC PT "6 Clicks" Mobility   Outcome Measure  Help needed turning from your back to your side while in a flat bed without using bedrails?: None Help needed moving from lying on your back to sitting on the side of a flat bed without using bedrails?: None Help needed moving to and from a bed to a chair (including a wheelchair)?: None Help needed standing up from a chair using your arms (e.g., wheelchair or bedside chair)?: A Little Help needed to walk in hospital room?: A Little Help needed climbing 3-5 steps with a railing? : A Little 6 Click Score: 21    End of Session Equipment Utilized During Treatment: Other (comment) (issued bone foam but pt unable to tolerate) Activity Tolerance: Patient tolerated treatment well Patient left: in bed;with call bell/phone within reach;with bed alarm set Nurse Communication: Mobility status;Precautions;Weight bearing status;Other (comment) PT Visit Diagnosis: Other abnormalities of gait and mobility (R26.89);Muscle weakness (generalized) (M62.81);Difficulty in walking, not elsewhere classified (R26.2);Pain Pain - Right/Left: Left Pain - part of body: Knee     Time: 0932-3557 PT Time Calculation (min) (ACUTE ONLY): 24 min  Charges:  $Gait Training: 8-22 mins $Therapeutic Exercise: 23-37 mins $Therapeutic Activity: 8-22 mins                     Julaine Fusi  PTA 06/22/20, 3:54 PM

## 2020-06-22 NOTE — Progress Notes (Signed)
Physical Therapy Treatment Patient Details Name: Diana Deleon MRN: [id number] DOB: [birth year] Today's Date: 06/22/2020    History of Present Illness Pt is a 59 y.o. female s/p L TKA 7/6 secondary to OA.  PMH includes, htn, DM, R breast CA, h/o mastectomy, psoriasis.    PT Comments    Pt was long sitting in bed upon arriving. She agrees to PT session and is cooperative throughout. Pt reports 3/10 pain at rest that elevated to 5/10 in wt bearing. She was able to progress from supine(flat bed) to short sit on R side of bed without assistance. Stood to [employer] with supervision and ambulated 220 ft without LOB. Pt is progressing well with acute PT. She was able to perform and tolerate AROM knee flex while seated EOB. PM session will focus on ROM and strength progression. At conclusion of session, pt was seated in recliner with support daughter/caregiver at bedside + OT entering room for OT eval. Pt will benefit from continued skilled [facility] PT at [place] to address strength and ROM deficits.      Follow Up Recommendations  Home health PT     Equipment Recommendations  Other (comment) (pt has all equipment needs met)    Recommendations for Other Services       Precautions / Restrictions Precautions Precautions: Fall;Knee Precaution Booklet Issued: Yes (comment) Precaution Comments: wound vac Restrictions Weight Bearing Restrictions: Yes LLE Weight Bearing: Weight bearing as tolerated    Mobility  Bed Mobility Overal bed mobility: Needs Assistance Bed Mobility: Supine to Sit     Supine to sit: Supervision     General bed mobility comments: Pt was able to exit R side of flat bed without physical assist.  VCs for technique improvements only.  Transfers Overall transfer level: Needs assistance Equipment used: Rolling walker (2 wheeled) Transfers: Sit to/from Stand Sit to Stand: Supervision         General transfer comment: Pt was able to STS without lifting assistance with vcs only for  hand placement and improved fwd wt shift. Good eccentric control with stand to sit.  Ambulation/Gait Ambulation/Gait assistance: Supervision Gait Distance (Feet): 220 Feet Assistive device: Rolling walker (2 wheeled) Gait Pattern/deviations: Step-through pattern Gait velocity: decreased   General Gait Details: no LOB with ambulation 200 ft with RW. started with step to pattern but was able to progress to step throughout by the end of gait training.   Stairs             Wheelchair Mobility    Modified Rankin (Stroke Patients Only)       Balance Overall balance assessment: Needs assistance Sitting-balance support: No upper extremity supported;Feet supported Sitting balance-Leahy Scale: Good Sitting balance - Comments: no LOB in sitting with reaching outside BOS   Standing balance support: Bilateral upper extremity supported Standing balance-Leahy Scale: Good Standing balance comment: no LOB with static and dynamic activities.                            Cognition Arousal/Alertness: Awake/alert Behavior During Therapy: WFL for tasks assessed/performed Overall Cognitive Status: Within Functional Limits for tasks assessed                                 General Comments: Pt was A and O x 4. Cooperative and pleasant throughout.      Exercises Total Joint Exercises Knee Flexion: AROM;5 reps;Left Other  Exercises Other Exercises: Pt/dtr instructed in falls prevention, pet care considerations, home/routines modifications, AE/DME for ADL, compression stocking mgt, and polar care mgt; handout provided; both verbalized understanding    General Comments        Pertinent Vitals/Pain Pain Assessment: 0-10 Pain Score: 6  Pain Location: L knee Pain Descriptors / Indicators: Sore Pain Intervention(s): Limited activity within patient's tolerance;Monitored during session;Premedicated before session;Repositioned;Ice applied    Home Living  Family/patient expects to be discharged to:: Private residence Living Arrangements: Spouse/significant other Available Help at Discharge: Family Type of Home: House Home Access: Stairs to enter Entrance Stairs-Rails: Right;Left;Can reach both Home Layout: Two level;Able to live on main level with bedroom/bathroom Home Equipment: Gilford Rile - 2 wheels;Walker - 4 wheels;Bedside commode;Wheelchair - manual;Hand held shower head;Grab bars - tub/shower;Electric scooter;Shower seat;Adaptive equipment      Prior Function Level of Independence: Independent          PT Goals (current goals can now be found in the care plan section) Acute Rehab PT Goals Patient Stated Goal: to improve mobility and pain Progress towards PT goals: Progressing toward goals    Frequency    BID      PT Plan Current plan remains appropriate    Co-evaluation              AM-PAC PT "6 Clicks" Mobility   Outcome Measure  Help needed turning from your back to your side while in a flat bed without using bedrails?: A Little Help needed moving from lying on your back to sitting on the side of a flat bed without using bedrails?: A Little Help needed moving to and from a bed to a chair (including a wheelchair)?: A Little Help needed standing up from a chair using your arms (e.g., wheelchair or bedside chair)?: A Little Help needed to walk in hospital room?: A Little Help needed climbing 3-5 steps with a railing? : A Little 6 Click Score: 18    End of Session Equipment Utilized During Treatment: Gait belt Activity Tolerance: Patient tolerated treatment well Patient left: in chair;with call bell/phone within reach;with chair alarm set;with family/visitor present;with SCD's reapplied;Other (comment) Nurse Communication: Mobility status;Precautions;Weight bearing status;Other (comment) PT Visit Diagnosis: Other abnormalities of gait and mobility (R26.89);Muscle weakness (generalized) (M62.81);Difficulty in  walking, not elsewhere classified (R26.2);Pain Pain - Right/Left: Left Pain - part of body: Knee     Time: 0915-0955 PT Time Calculation (min) (ACUTE ONLY): 40 min  Charges:  $Gait Training: 8-22 mins $Therapeutic Exercise: 8-22 mins $Therapeutic Activity: 8-22 mins                     Julaine Fusi PTA 06/22/20, 12:24 PM

## 2020-06-23 LAB — GLUCOSE, CAPILLARY
Glucose-Capillary: 71 mg/dL (ref 70–99)
Glucose-Capillary: 126 mg/dL — ABNORMAL HIGH (ref 70–99)

## 2020-06-23 MED ORDER — METHOCARBAMOL 500 MG PO TABS: 500.0000 mg | ORAL_TABLET | Freq: Three times a day (TID) | ORAL | 1 refills | Status: DC | PRN

## 2020-06-23 NOTE — Progress Notes (Addendum)
Subjective: 2 Days Post-Op Procedure(s) (LRB): LEFT TOTAL KNEE ARTHROPLASTY (Left) Patient reports pain as 6 on 0-10 scale.   Patient is well, and has had no acute complaints or problems Plan is to go Home after hospital stay. Negative for chest pain and shortness of breath Fever: no Gastrointestinal:Negative for nausea and vomiting Reports she did have a cramp in the leg last night but this has improved.  Objective: Vital signs in last 24 hours: Temp:  [97.7 F (36.5 C)-98.6 F (37 C)] 98 F (36.7 C) (07/08 0404) Pulse Rate:  [62-72] 67 (07/08 0404) Resp:  [18] 18 (07/08 0404) BP: (119-140)/(61-87) 119/82 (07/08 0404) SpO2:  [96 %-99 %] 97 % (07/08 0404)  Intake/Output from previous day:  Intake/Output Summary (Last 24 hours) at 06/23/2020 0737 Last data filed at 06/22/2020 1500 Gross per 24 hour  Intake 983.33 ml  Output --  Net 983.33 ml    Intake/Output this shift: No intake/output data recorded.  Labs: Recent Labs    06/21/20 0641 06/21/20 1436 06/22/20 0609  HGB 15.6* 14.5 12.7   Recent Labs    06/21/20 1436 06/22/20 0609  WBC 8.6 14.4*  RBC 4.78 4.05  HCT 40.3 35.1*  PLT 225 233   Recent Labs    06/21/20 0641 06/21/20 0641 06/21/20 1436 06/22/20 0609  NA 137  --   --  136  K 3.4*  --   --  3.9  CL 98  --   --  102  CO2  --   --   --  27  BUN 19  --   --  22*  CREATININE 1.10*   < > 1.06* 0.90  GLUCOSE 217*  --   --  226*  CALCIUM  --   --   --  8.3*   < > = values in this interval not displayed.   No results for input(s): LABPT, INR in the last 72 hours.   EXAM General - Patient is Alert, Appropriate and Oriented Extremity - ABD soft Sensation intact distally Intact pulses distally Dorsiflexion/Plantar flexion intact Incision: Prevena woundvac intact with minimal bloody drainage. No cellulitis present Dressing/Incision - blood tinged drainage to the prevena, 50cc of bloody drainage. Motor Function - intact, moving foot and toes well  on exam.  Abdomen soft with normal bowel sounds. Negative homans to bilateral lower extremities.  Past Medical History:  Diagnosis Date  . Anemia    "off and on"  . Arthritis    "right toes" (03/24/2015)  . Cancer of right breast ([place]) 2015  . Cyst of cystic duct    chest- central location, taking Cephalexin currently  . Diabetes mellitus without complication ([place])   . Fibroids    hyst. 2006  . GERD (gastroesophageal reflux disease)   . History of abnormal Pap smear    has had leep in past  . History of cold sores    has used valtrex in past  . History of recurrent UTIs   . Hypertension   . Hypothyroidism   . Intermittent vertigo   . Seborrheic dermatitis    local on R scalp and in ear (failed mult meds- uses steroid spray)  . Thyroid disease     Assessment/Plan: 2 Days Post-Op Procedure(s) (LRB): LEFT TOTAL KNEE ARTHROPLASTY (Left) Active Problems:   S/P TKR (total knee replacement) using cement, left  Estimated body mass index is 35.47 kg/m as calculated from the following:   Height as of this encounter: 5' 3.75" (1.619 m).  Weight as of this encounter: 93 kg. Advance diet Up with therapy D/C IV fluids when tolerating po intake.  Continue with therapy, need to clear stairs today. Hypokalemia resolved. Continue with Prevena upon discharge. Work on [employer]. Plan for discharge home this afternoon pending bowel movement.  DVT Prophylaxis - Lovenox, Foot Pumps and TED hose Weight-Bearing as tolerated to left leg  J. Cameron Proud, PA-C [facility] Orthopaedic Surgery 06/23/2020, 7:37 AM

## 2020-06-23 NOTE — Progress Notes (Signed)
Physical Therapy Treatment Patient Details Name: Diana Deleon MRN: [id number] DOB: 12/21/1960 Today's Date: 06/23/2020    History of Present Illness Pt is a 59 y.o. female s/p L TKA 7/6 secondary to OA.  PMH includes, htn, DM, R breast CA, h/o mastectomy, psoriasis.    PT Comments    Ready for session.  She is able to complete lap around unit and stair training with ease this am.  To commode to void but no BM noted.  Requested to return to bed.  No further questions or concerns voiced.  Discussed recovery and expectations.    Follow Up Recommendations  Home health PT     Equipment Recommendations  None recommended by PT    Recommendations for Other Services       Precautions / Restrictions Precautions Precautions: Fall;Knee Precaution Booklet Issued: Yes (comment) Precaution Comments: wound vac Restrictions Weight Bearing Restrictions: Yes LLE Weight Bearing: Weight bearing as tolerated    Mobility  Bed Mobility Overal bed mobility: Modified Independent Bed Mobility: Supine to Sit     Supine to sit: Supervision;Modified independent (Device/Increase time)        Transfers Overall transfer level: Needs assistance Equipment used: Rolling walker (2 wheeled) Transfers: Sit to/from Stand Sit to Stand: Supervision            Ambulation/Gait Ambulation/Gait assistance: Supervision Gait Distance (Feet): 220 Feet Assistive device: Rolling walker (2 wheeled) Gait Pattern/deviations: Step-through pattern Gait velocity: decreased   General Gait Details: seated rest on mat table in gym before and after stair training.   Stairs Stairs: Yes Stairs assistance: Min guard Stair Management: Step to pattern;One rail Right Number of Stairs: 4 Comments:  With ease with 2 rails as at home.   Wheelchair Mobility    Modified Rankin (Stroke Patients Only)       Balance Overall balance assessment: Needs assistance Sitting-balance support: No upper extremity  supported;Feet supported Sitting balance-Leahy Scale: Good Sitting balance - Comments: no LOB in sitting with reaching outside BOS   Standing balance support: Bilateral upper extremity supported Standing balance-Leahy Scale: Good Standing balance comment: no LOB with static and dynamic activities.                            Cognition Arousal/Alertness: Awake/alert Behavior During Therapy: WFL for tasks assessed/performed Overall Cognitive Status: Within Functional Limits for tasks assessed                                 General Comments: Pt was A and O x 4. Cooperative and pleasant throughout.      Exercises Total Joint Exercises Ankle Circles/Pumps: AROM;Strengthening;Both;10 reps;Supine Quad Sets: AROM;Strengthening;Both;10 reps;Supine Short Arc Quad: AROM;Left;10 reps;Supine Heel Slides: AROM;Left;10 reps;Supine Hip ABduction/ADduction: AROM;Left;10 reps;Supine Straight Leg Raises: AROM;Left;5 reps Long Arc Quad: Other (comment) (unable to perform but pt did attempt) Knee Flexion: AROM;5 reps;Left Goniometric ROM: 2-91    General Comments        Pertinent Vitals/Pain Pain Score: 2  Pain Location: L knee Pain Descriptors / Indicators: Sore Pain Intervention(s): Limited activity within patient's tolerance;Monitored during session;Premedicated before session    Home Living                      Prior Function            PT Goals (current goals can now be found in the care  plan section) Acute Rehab PT Goals Patient Stated Goal: To get home safely  Progress towards PT goals: Progressing toward goals    Frequency    BID      PT Plan Current plan remains appropriate    Co-evaluation              AM-PAC PT "6 Clicks" Mobility   Outcome Measure  Help needed turning from your back to your side while in a flat bed without using bedrails?: None Help needed moving from lying on your back to sitting on the side of a flat  bed without using bedrails?: None Help needed moving to and from a bed to a chair (including a wheelchair)?: None Help needed standing up from a chair using your arms (e.g., wheelchair or bedside chair)?: A Little Help needed to walk in hospital room?: A Little Help needed climbing 3-5 steps with a railing? : A Little 6 Click Score: 21    End of Session Equipment Utilized During Treatment: Other (comment) (issued bone foam but pt unable to tolerate) Activity Tolerance: Patient tolerated treatment well Patient left: in bed;with call bell/phone within reach;with bed alarm set Nurse Communication: Mobility status;Precautions;Weight bearing status;Other (comment) PT Visit Diagnosis: Other abnormalities of gait and mobility (R26.89);Muscle weakness (generalized) (M62.81);Difficulty in walking, not elsewhere classified (R26.2);Pain Pain - Right/Left: Left Pain - part of body: Knee     Time: 3524-8185 PT Time Calculation (min) (ACUTE ONLY): 23 min  Charges:  $Gait Training: 8-22 mins $Therapeutic Exercise: 8-22 mins                    Chesley Noon, PTA 06/23/20, 9:48 AM

## 2020-06-23 NOTE — Progress Notes (Addendum)
Discharge Note: Per case manager, Pt does not need further equipment from [facility]. Reviewed discharge instructions with Pt. Pt. Verbalized understanding. Pt leaving facility with personal belongings. Pt d/c from facility with incentive spirometer, bone foam, Polar care equipment. Ted hose on BLE. Woundvac intact with prevena.  Obtained vitals. Removed IV catheter. IV catheter intact noted.  Pt transported to home via private vehicle.

## 2020-07-05 ENCOUNTER — Telehealth: Payer: Self-pay | Admitting: Hematology and Oncology

## 2020-07-05 NOTE — Telephone Encounter (Signed)
Scheduled appt per 7/19 sch msg. Left voicemail with appt date and time.

## 2020-07-18 NOTE — Progress Notes (Signed)
Patient Care Team: Tower, Wynelle Fanny, MD as PCP - General Nicholas Lose, MD as Consulting Physician (Hematology and Oncology) Excell Seltzer, MD (Inactive) as Consulting Physician (General Surgery) Eppie Gibson, MD as Attending Physician (Radiation Oncology) Holley Bouche, NP (Inactive) as Nurse Practitioner (Nurse Practitioner)  DIAGNOSIS:    ICD-10-CM   1. Class 2 severe obesity due to excess calories with serious comorbidity and body mass index (BMI) of 36.0 to 36.9 in adult [facility])  E66.01    Z68.36     SUMMARY OF ONCOLOGIC HISTORY: Oncology History  History of breast cancer  10/20/2014 Initial Diagnosis   Right breast invasive and in situ mammary cancer grade 2 E-cadherin positive, invasive ductal carcinoma with DCIS focal atypical cells negative for atypia and ER 70% PR 80% HER-2 negative ratio 1.26 Ki-67 27%   10/22/2014 Breast MRI   Right breast upper outer quadrant 2.3 cm biopsy-proven malignancy   11/25/2014 Surgery   Right simple mastectomy: Tumor #1 upper lateral invasive high-grade ductal carcinoma 2.8 cm plus high-grade DCIS plus ALH and LCIS; tumor #2 upper medial DCIS 1.5 cm. Margins negative.   11/25/2014 Oncotype testing   18 (11% 10-year risk of distant recurrence on Tamoxifen alone).    01/03/2015 -  Anti-estrogen oral therapy   Tamoxifen  20 mg daily discontinued 03/28/2015 and switched to anastrozole 1 mg 03/23/15 daily when she was found to be postmenopausal. Plan treatment duration 5 years   03/24/2015 Surgery   Left breast mammoplasty: Benign     CHIEF COMPLIANT: Follow-up of right breast cancer on anastrozole therapy  INTERVAL HISTORY: Diana Deleon is a 59 y.o. with above-mentioned history of right breast cancer treated with mastectomy and who is currently on anti-estrogen therapy with anastrozole. Mammogram on 03/01/20 showed no evidence of malignancy bilaterally. She presents to the clinic today for annual follow-up.   ALLERGIES:  is allergic to  metformin and related and norvasc [amlodipine besylate].  MEDICATIONS:  Current Outpatient Medications  Medication Sig Dispense Refill  . alendronate (FOSAMAX) 70 MG tablet Take 1 tablet (70 mg total) by mouth every 7 (seven) days. Take with a full glass of water on an empty stomach. 12 tablet 3  . anastrozole (ARIMIDEX) 1 MG tablet TAKE 1 TABLET(1 MG) BY MOUTH DAILY (Patient taking differently: Take 1 mg by mouth daily. ) 90 tablet 3  . aspirin EC 81 MG tablet Take 81 mg by mouth daily.    [name] Kitchen augmented betamethasone dipropionate (DIPROLENE-AF) 0.05 % ointment Apply 1 application topically daily.     . Calcium Carb-Cholecalciferol (CALCIUM 600/VITAMIN D3) 600-800 MG-UNIT TABS Take 2 tablets by mouth daily.    . Clobetasol Propionate 0.05 % shampoo Apply 1 application topically once a week.    . enoxaparin (LOVENOX) 40 MG/0.4ML injection Inject 0.4 mLs (40 mg total) into the skin daily. 5.6 mL 0  . glipiZIDE (GLUCOTROL XL) 10 MG 24 hr tablet Take 1 tablet (10 mg total) by mouth daily with breakfast. 90 tablet 3  . HYDROcodone-acetaminophen (NORCO) 7.5-325 MG tablet Take 1-2 tablets by mouth every 4 (four) hours as needed for severe pain. 60 tablet 0  . insulin detemir (LEVEMIR) 100 UNIT/ML FlexPen Inject 40 Units into the skin daily. (Patient taking differently: Inject 40 Units into the skin at bedtime. ) 9 mL 3  . levothyroxine (SYNTHROID) 50 MCG tablet TAKE 1 TABLET(50 MCG) BY MOUTH DAILY (Patient taking differently: Take 50 mcg by mouth daily before breakfast. ) 90 tablet 3  . losartan-hydrochlorothiazide (HYZAAR)  100-25 MG tablet Take 1 tablet by mouth daily. 90 tablet 3  . meloxicam (MOBIC) 7.5 MG tablet Take 7.5 mg by mouth daily.    . methocarbamol (ROBAXIN) 500 MG tablet Take 1 tablet (500 mg total) by mouth every 8 (eight) hours as needed for muscle spasms. 30 tablet 1  . metoprolol succinate (TOPROL-XL) 50 MG 24 hr tablet Take 1 tablet (50 mg total) by mouth daily. Take with or immediately  following a meal. 90 tablet 3  . omeprazole (PRILOSEC) 20 MG capsule TAKE 1 CAPSULE BY MOUTH DAILY BEFORE BREAKFAST (Patient taking differently: Take 20 mg by mouth daily before breakfast. ) 90 capsule 3  . ONETOUCH DELICA LANCETS FINE MISC Use to check blood sugar once daily and as needed (dx. E11.9) 100 each 1  . ONETOUCH VERIO test strip USE TO CHECK BLOOD SUGAR ONCE DAILY AND AS NEEDED 100 each 2  . rosuvastatin (CRESTOR) 5 MG tablet Take 1 tablet (5 mg total) by mouth daily. 90 tablet 3  . traMADol (ULTRAM) 50 MG tablet Take 1 tablet (50 mg total) by mouth every 6 (six) hours. 30 tablet 0  . valACYclovir (VALTREX) 500 MG tablet Take 1 tablet (500 mg total) by mouth 2 (two) times daily. For 5 days for an outbreak (Patient taking differently: Take 500 mg by mouth 2 (two) times daily as needed (For 5 days for an outbreak). ) 10 tablet 1  . venlafaxine XR (EFFEXOR-XR) 75 MG 24 hr capsule TAKE 1 CAPSULE(75 MG) BY MOUTH DAILY WITH BREAKFAST (Patient taking differently: Take 75 mg by mouth daily with breakfast. ) 90 capsule 1   No current facility-administered medications for this visit.    PHYSICAL EXAMINATION: ECOG PERFORMANCE STATUS: 1 - Symptomatic but completely ambulatory  Vitals:   07/19/20 1513  BP: (!) 162/86  Pulse: 87  Resp: 14  Temp: 98.3 F (36.8 C)  SpO2: 98%   Filed Weights   07/19/20 1513  Weight: 214 lb 9.6 oz (97.3 kg)    BREAST: No palpable masses or nodules in either right or left breasts. No palpable axillary supraclavicular or infraclavicular adenopathy no breast tenderness or nipple discharge. (exam performed in the presence of a chaperone)  LABORATORY DATA:  I have reviewed the data as listed CMP Latest Ref Rng & Units 06/22/2020 06/21/2020 06/21/2020  Glucose 70 - 99 mg/dL 226(H) - 217(H)  BUN 6 - 20 mg/dL 22(H) - 19  Creatinine 0.44 - 1.00 mg/dL 0.90 1.06(H) 1.10(H)  Sodium 135 - 145 mmol/L 136 - 137  Potassium 3.5 - 5.1 mmol/L 3.9 - 3.4(L)  Chloride 98 - 111  mmol/L 102 - 98  CO2 22 - 32 mmol/L 27 - -  Calcium 8.9 - 10.3 mg/dL 8.3(L) - -  Total Protein 6.5 - 8.1 g/dL - - -  Total Bilirubin 0.3 - 1.2 mg/dL - - -  Alkaline Phos 38 - 126 U/L - - -  AST 15 - 41 U/L - - -  ALT 0 - 44 U/L - - -    Lab Results  Component Value Date   WBC 14.4 (H) 06/22/2020   HGB 12.7 06/22/2020   HCT 35.1 (L) 06/22/2020   MCV 86.7 06/22/2020   PLT 233 06/22/2020   NEUTROABS 3.4 06/16/2020    ASSESSMENT & PLAN:    Right breast invasive ductal carcinoma 2.8 cm status post mastectomy grade 3 with high-grade DCIS, ALH and LCIS; separate tumor which was DCIS 1.5 cm, 1 SLN negative, ER 86%,  PR 95%, HER-2 negative ratio 1.52, Ki-67 27% T2 N0 M0 stage II a, Oncotype Dx score 18 (11% ROR) Low risk; started tamoxifen 12/27/2014 Switched to anastrozole April 2016  Anastrozole Toxicities: 1. Severe hot flashes On Effexor.Markedly improved   Recent left knee replacement surgery: Recovering from it.   Bone density 01/02/2018: T score -2.3: Osteopenia I recommended bisphosphonate therapy Takes Fosamax  Her mother passed away in 2019/09/29   Breast Cancer Surveillance: 1. Breast exam8/02/2020 normal 2. [facility]    RTC in 1 year for follow up.    No orders of the defined types were placed in this encounter.  The patient has a good understanding of the overall plan. she agrees with it. she will call with any problems that may develop before the next visit here.  Total time spent: 30 mins including face to face time and time spent for planning, charting and coordination of care  Nicholas Lose, MD 07/19/2020  I, Cloyde Reams Dorshimer, am acting as scribe for Dr. Nicholas Lose.  I have reviewed the above documentation for accuracy and completeness, and I agree with the above.

## 2020-07-19 ENCOUNTER — Other Ambulatory Visit: Payer: Self-pay

## 2020-07-19 ENCOUNTER — Inpatient Hospital Stay: Payer: Managed Care, Other (non HMO) | Attending: Hematology and Oncology | Admitting: Hematology and Oncology

## 2020-07-19 DIAGNOSIS — Z6836 Body mass index (BMI) 36.0-36.9, adult: Secondary | ICD-10-CM | POA: Diagnosis not present

## 2020-07-19 DIAGNOSIS — Z7982 Long term (current) use of aspirin: Secondary | ICD-10-CM | POA: Diagnosis not present

## 2020-07-19 DIAGNOSIS — Z9011 Acquired absence of right breast and nipple: Secondary | ICD-10-CM | POA: Diagnosis not present

## 2020-07-19 DIAGNOSIS — C50811 Malignant neoplasm of overlapping sites of right female breast: Secondary | ICD-10-CM | POA: Insufficient documentation

## 2020-07-19 DIAGNOSIS — Z7901 Long term (current) use of anticoagulants: Secondary | ICD-10-CM | POA: Diagnosis not present

## 2020-07-19 DIAGNOSIS — Z79811 Long term (current) use of aromatase inhibitors: Secondary | ICD-10-CM | POA: Diagnosis not present

## 2020-07-19 DIAGNOSIS — M8580 Other specified disorders of bone density and structure, unspecified site: Secondary | ICD-10-CM | POA: Diagnosis not present

## 2020-07-19 DIAGNOSIS — Z79899 Other long term (current) drug therapy: Secondary | ICD-10-CM | POA: Diagnosis not present

## 2020-07-19 DIAGNOSIS — Z170 Estrogen receptor positive status [ER+]: Secondary | ICD-10-CM | POA: Insufficient documentation

## 2020-07-19 MED ORDER — ANASTROZOLE 1 MG PO TABS: 1.0000 mg | ORAL_TABLET | Freq: Every day | ORAL | 3 refills | Status: DC

## 2020-07-19 NOTE — Assessment & Plan Note (Signed)
Right breast invasive ductal carcinoma 2.8 cm status post mastectomy grade 3 with high-grade DCIS, ALH and LCIS; separate tumor which was DCIS 1.5 cm, 1 SLN negative, ER 86%, PR 95%, HER-2 negative ratio 1.52, Ki-67 27% T2 N0 M0 stage II a, Oncotype Dx score 18 (11% ROR) Low risk; started tamoxifen 12/27/2014 Switched to anastrozole April 2016  Anastrozole Toxicities: 1. Severe hot flashes On Effexor.Markedly improved  2. Tingling and numbness when she lifts her arm above the head: Resolved Denies any hot flashes.  Bone density 01/02/2018: T score -2.3: Osteopenia I recommended bisphosphonate therapy Takes Fosamax if she remembers it.  Her mother is at home and has lost 40 pounds and she is trying to help her gain weight and get better.   Breast Cancer Surveillance: 1. Breast exam8/02/2020 normal 2. [facility].    RTC in 1 year for follow up.

## 2020-07-26 ENCOUNTER — Telehealth: Payer: Self-pay | Admitting: Family Medicine

## 2020-07-26 DIAGNOSIS — I10 Essential (primary) hypertension: Secondary | ICD-10-CM

## 2020-07-26 DIAGNOSIS — E1165 Type 2 diabetes mellitus with hyperglycemia: Secondary | ICD-10-CM

## 2020-07-26 NOTE — Telephone Encounter (Signed)
-----   Message from Cloyd Stagers, RT sent at 07/13/2020  1:46 PM EDT ----- Regarding: Lab Orders for Wednesday 8.11.2021 Please place lab orders for Wednesday 8.11.2021, office visit for 3 mo f/u on Wednesday 8.18.2021 Thank you, Dyke Maes RT(R)

## 2020-07-27 ENCOUNTER — Other Ambulatory Visit (INDEPENDENT_AMBULATORY_CARE_PROVIDER_SITE_OTHER): Payer: Managed Care, Other (non HMO)

## 2020-07-27 ENCOUNTER — Other Ambulatory Visit: Payer: Self-pay

## 2020-07-27 ENCOUNTER — Other Ambulatory Visit: Payer: Self-pay | Admitting: Hematology and Oncology

## 2020-07-27 DIAGNOSIS — Z794 Long term (current) use of insulin: Secondary | ICD-10-CM

## 2020-07-27 DIAGNOSIS — I10 Essential (primary) hypertension: Secondary | ICD-10-CM | POA: Diagnosis not present

## 2020-07-27 DIAGNOSIS — E1165 Type 2 diabetes mellitus with hyperglycemia: Secondary | ICD-10-CM | POA: Diagnosis not present

## 2020-07-27 LAB — HEMOGLOBIN A1C: Hgb A1c MFr Bld: 7.1 % — ABNORMAL HIGH (ref 4.6–6.5)

## 2020-07-27 LAB — COMPREHENSIVE METABOLIC PANEL
ALT: 19 U/L (ref 0–35)
AST: 19 U/L (ref 0–37)
Albumin: 4.3 g/dL (ref 3.5–5.2)
Alkaline Phosphatase: 100 U/L (ref 39–117)
BUN: 13 mg/dL (ref 6–23)
CO2: 30 mEq/L (ref 19–32)
Calcium: 10 mg/dL (ref 8.4–10.5)
Chloride: 99 mEq/L (ref 96–112)
Creatinine, Ser: 0.94 mg/dL (ref 0.40–1.20)
GFR: 61.23 mL/min (ref >60.00)
Glucose, Bld: 149 mg/dL — ABNORMAL HIGH (ref 70–99)
Potassium: 3.4 mEq/L — ABNORMAL LOW (ref 3.5–5.1)
Sodium: 141 mEq/L (ref 135–145)
Total Bilirubin: 0.6 mg/dL (ref 0.2–1.2)
Total Protein: 7.5 g/dL (ref 6.0–8.3)

## 2020-07-28 ENCOUNTER — Other Ambulatory Visit: Payer: Self-pay | Admitting: Family Medicine

## 2020-08-03 ENCOUNTER — Encounter: Payer: Self-pay | Admitting: Family Medicine

## 2020-08-03 ENCOUNTER — Other Ambulatory Visit: Payer: Self-pay

## 2020-08-03 ENCOUNTER — Ambulatory Visit (INDEPENDENT_AMBULATORY_CARE_PROVIDER_SITE_OTHER): Payer: Managed Care, Other (non HMO) | Admitting: Family Medicine

## 2020-08-03 VITALS — BP 122/84 | HR 84 | Temp 96.5°F | Wt 215.0 lb

## 2020-08-03 DIAGNOSIS — E785 Hyperlipidemia, unspecified: Secondary | ICD-10-CM

## 2020-08-03 DIAGNOSIS — E1169 Type 2 diabetes mellitus with other specified complication: Secondary | ICD-10-CM

## 2020-08-03 DIAGNOSIS — E1165 Type 2 diabetes mellitus with hyperglycemia: Secondary | ICD-10-CM

## 2020-08-03 DIAGNOSIS — I10 Essential (primary) hypertension: Secondary | ICD-10-CM

## 2020-08-03 DIAGNOSIS — Z794 Long term (current) use of insulin: Secondary | ICD-10-CM | POA: Diagnosis not present

## 2020-08-03 MED ORDER — LEVEMIR FLEXTOUCH 100 UNIT/ML ~~LOC~~ SOPN: 45.0000 [IU] | PEN_INJECTOR | Freq: Every day | SUBCUTANEOUS | 0 refills | Status: DC

## 2020-08-03 NOTE — Assessment & Plan Note (Signed)
bp in fair control at this time  BP Readings from Last 1 Encounters:  08/03/20 122/84   No changes needed Most recent labs reviewed  Disc lifstyle change with low sodium diet and exercise  K is 3.4 - will add mvi and try to eat more produce -re check next time

## 2020-08-03 NOTE — Assessment & Plan Note (Signed)
Lab Results  Component Value Date   HGBA1C 7.1 (H) 07/27/2020   Much improvement-almost at goal  Will inc levemir to 45 u daily  Continue low glycemic diet  Gradually inc exercise (s/p TKR) Continue ace and statin  F/u 3 mo

## 2020-08-03 NOTE — Progress Notes (Signed)
Subjective:    Patient ID: Diana Deleon, female    DOB: Nov 26, 1961, 59 y.o.   MRN: [id number]  This visit occurred during the SARS-CoV-2 public health emergency.  Safety protocols were in place, including screening questions prior to the visit, additional usage of staff PPE, and extensive cleaning of exam room while observing appropriate contact time as indicated for disinfecting solutions.    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)   HTN bp is stable today  No cp or palpitations or headaches or edema  No side effects to medicines  BP Readings from Last 3 Encounters:  08/03/20 122/84  07/19/20 (!) 162/86  06/23/20 128/82    Pulse Readings from Last 3 Encounters:  08/03/20 84  07/19/20 87  06/23/20 77     Lab Results  Component Value Date   CREATININE 0.94 07/27/2020   BUN 13 07/27/2020   NA 141 07/27/2020   K 3.4 (L) 07/27/2020   CL 99 07/27/2020   CO2 30 07/27/2020    DM2 Lab Results  Component Value Date   HGBA1C 7.1 (H) 07/27/2020   This is down from 8.6  last visit increased basal insulin to 40 u  Continues glipizide XL 10 mg daily   Had to have a little insulin in the hospital (got as low as 77)    Glucose readings - right after surgery her glucose readings were avg high 100s (dexcom)  Recently still above 150  149 on our check   Exercise wise- going to PT 3 times per week  At home she rides a bike - (increasing slowly) - avg about 10 minutes at a time   Diet has been her diet  Diabetic diet most of the time-does not snack or cheat often  Sweets- few times per month  Water intake has increased  Is covid immunized   Takes arb and statin   Cholesterol controlled  Lab  Results  Component Value Date   CHOL 141 04/28/2020   HDL 47.20 04/28/2020   LDLCALC 75 04/28/2020   TRIG 97.0 04/28/2020   CHOLHDL 3 04/28/2020   crestor and diet   Patient Active Problem List   Diagnosis Date Noted   S/P TKR (total knee replacement) using cement, left 06/21/2020   Hyperlipidemia associated with type 2 diabetes mellitus ([place]) 11/02/2018   Low back pain 03/21/2018   Osteopenia 01/05/2018   Aromatase inhibitor use 12/25/2017   Psoriasis 11/02/2016   Colon cancer screening 11/02/2016   Class 2 severe obesity due to excess calories with serious comorbidity and body mass index (BMI) of 36.0 to 36.9 in adult ([place]) 10/31/2015   History of breast cancer 10/25/2014   Type 2 diabetes mellitus with hyperglycemia ([place]) 07/14/2013   Routine general medical examination at a health care facility 10/12/2012   Hypothyroidism 06/03/2012   Overactive bladder 05/27/2012   HSV infection 07/07/2010   URINARY URGENCY 07/15/2009   LOW BACK PAIN, CHRONIC 05/17/2008   HEADACHE, CHRONIC 05/17/2008   Essential hypertension 02/12/2008   Past Medical History:  Diagnosis Date   Anemia    "off and on"   Arthritis    "  right toes" (03/24/2015)   Cancer of right breast ([place]) 2015   Cyst of cystic duct    chest- central location, taking Cephalexin currently   Diabetes mellitus without complication ([place])    Fibroids    hyst. 2006   GERD (gastroesophageal reflux disease)    History of abnormal Pap smear    has had leep in past   History of cold sores    has used valtrex in past   History of recurrent UTIs    Hypertension    Hypothyroidism    Intermittent vertigo    Seborrheic dermatitis    local on R scalp and in ear (failed mult meds- uses steroid spray)   Thyroid disease    Past Surgical History:  Procedure Laterality Date   BREAST RECONSTRUCTION WITH PLACEMENT OF TISSUE EXPANDER AND FLEX HD (ACELLULAR HYDRATED DERMIS) Right 11/25/2014    Procedure: RIGHT BREAST RECONSTRUCTION WITH PLACEMENT OF TISSUE EXPANDER AND USE OF ACELLULAR DERMRAL MATRIX ;  Surgeon: Crissie Reese, MD;  Location: [facility];  Service: Plastics;  Laterality: Right;   BREAST REDUCTION SURGERY Left 03/24/2015   Procedure: LEFT BREAST REDUCTION  (BREAST);  Surgeon: Crissie Reese, MD;  Location: [place] [facility];  Service: Plastics;  Laterality: Left;   BREAST SURGERY Right    mastectomy  lymph node removal   COLONOSCOPY     COLONOSCOPY WITH PROPOFOL N/A 12/28/2016   Procedure: COLONOSCOPY WITH PROPOFOL;  Surgeon: Jonathon Bellows, MD;  Location: [facility];  Service: Endoscopy;  Laterality: N/A;   LAPAROSCOPIC CHOLECYSTECTOMY  2003   LITHOTRIPSY  ~ 2007   REMOVAL OF TISSUE EXPANDER Right 03/24/2015   REMOVAL OF TISSUE EXPANDER AND PLACEMENT OF IMPLANT Right 03/24/2015   w/delayed construction   REMOVAL OF TISSUE EXPANDER AND PLACEMENT OF IMPLANT Right 03/24/2015   Procedure: REMOVAL OF RIGHT BREAST TISSUE EXPANDER AND DELAYED BREAST RECONSTRUCTION WITH PLACEMENT OF IMPLANT;  Surgeon: Crissie Reese, MD;  Location: [place];  Service: Plastics;  Laterality: Right;   SIMPLE MASTECTOMY WITH AXILLARY SENTINEL NODE BIOPSY Right 11/25/2014   Procedure: RIGHT TOTAL  MASTECTOMY WITH AXILLARY SENTINEL NODE BIOPSY ;  Surgeon: Excell Seltzer, MD;  Location: [place];  Service: General;  Laterality: Right;   TOTAL KNEE ARTHROPLASTY Left 06/21/2020   Procedure: LEFT TOTAL KNEE ARTHROPLASTY;  Surgeon: Hessie Knows, MD;  Location: [facility];  Service: Orthopedics;  Laterality: Left;   TUBAL LIGATION     VAGINAL DELIVERY  x3   VAGINAL HYSTERECTOMY  2006   "fibroids"   Social History   Tobacco Use   Smoking status: Never Smoker   Smokeless tobacco: Never Used  Vaping Use   Vaping Use: Never used  Substance Use Topics   Alcohol use: No    Alcohol/week: 0.0 standard drinks   Drug use: No   Family History  Problem Relation Age of Onset   Alcohol abuse Father    COPD Father      Hypertension Father    Hypertension Mother    Parkinson's disease Mother    Allergies  Allergen Reactions   Metformin And Related Diarrhea   Norvasc [Amlodipine Besylate] Swelling   Current Outpatient Medications on File Prior to Visit  Medication Sig Dispense Refill   alendronate (FOSAMAX) 70 MG tablet Take 1 tablet (70 mg total) by mouth every 7 (seven) days. Take with a full glass of water on an empty stomach. 12 tablet 3   anastrozole (ARIMIDEX) 1 MG tablet Take 1 tablet (1 mg total) by mouth daily. [place]  tablet 3   aspirin EC 81 MG tablet Take 81 mg by mouth daily.     augmented betamethasone dipropionate (DIPROLENE-AF) 0.05 % ointment Apply 1 application topically daily.      Calcium Carb-Cholecalciferol (CALCIUM 600/VITAMIN D3) 600-800 MG-UNIT TABS Take 2 tablets by mouth daily.     Clobetasol Propionate 0.05 % shampoo Apply 1 application topically once a week.     glipiZIDE (GLUCOTROL XL) 10 MG 24 hr tablet Take 1 tablet (10 mg total) by mouth daily with breakfast. 90 tablet 3   HYDROcodone-acetaminophen (NORCO) 7.5-325 MG tablet Take 1-2 tablets by mouth every 4 (four) hours as needed for severe pain. 60 tablet 0   levothyroxine (SYNTHROID) 50 MCG tablet TAKE 1 TABLET(50 MCG) BY MOUTH DAILY (Patient taking differently: Take 50 mcg by mouth daily before breakfast. ) 90 tablet 3   losartan-hydrochlorothiazide (HYZAAR) 100-25 MG tablet Take 1 tablet by mouth daily. 90 tablet 3   meloxicam (MOBIC) 7.5 MG tablet Take 7.5 mg by mouth daily.     methocarbamol (ROBAXIN) 500 MG tablet Take 1 tablet (500 mg total) by mouth every 8 (eight) hours as needed for muscle spasms. 30 tablet 1   metoprolol succinate (TOPROL-XL) 50 MG 24 hr tablet Take 1 tablet (50 mg total) by mouth daily. Take with or immediately following a meal. 90 tablet 3   omeprazole (PRILOSEC) 20 MG capsule TAKE 1 CAPSULE BY MOUTH DAILY BEFORE BREAKFAST (Patient taking differently: Take 20 mg by mouth daily  before breakfast. ) 90 capsule 3   ONETOUCH DELICA LANCETS FINE MISC Use to check blood sugar once daily and as needed (dx. E11.9) 100 each 1   ONETOUCH VERIO test strip USE TO CHECK BLOOD SUGAR ONCE DAILY AND AS NEEDED 100 each 2   rosuvastatin (CRESTOR) 5 MG tablet Take 1 tablet (5 mg total) by mouth daily. 90 tablet 3   valACYclovir (VALTREX) 500 MG tablet Take 1 tablet (500 mg total) by mouth 2 (two) times daily. For 5 days for an outbreak (Patient taking differently: Take 500 mg by mouth 2 (two) times daily as needed (For 5 days for an outbreak). ) 10 tablet 1   venlafaxine XR (EFFEXOR-XR) 75 MG 24 hr capsule TAKE 1 CAPSULE(75 MG) BY MOUTH DAILY WITH BREAKFAST (Patient taking differently: Take 75 mg by mouth daily with breakfast. ) 90 capsule 1   No current facility-administered medications on file prior to visit.      Review of Systems  Constitutional: Negative for activity change, appetite change, fatigue, fever and unexpected weight change.  HENT: Negative for congestion, ear pain, rhinorrhea, sinus pressure and sore throat.   Eyes: Negative for pain, redness and visual disturbance.  Respiratory: Negative for cough, shortness of breath and wheezing.   Cardiovascular: Negative for chest pain and palpitations.  Gastrointestinal: Negative for abdominal pain, blood in stool, constipation and diarrhea.  Endocrine: Negative for polydipsia and polyuria.  Genitourinary: Negative for dysuria, frequency and urgency.  Musculoskeletal: Positive for arthralgias. Negative for back pain and myalgias.       Knee pain- recovering from surgery and doing well   Skin: Negative for pallor and rash.  Allergic/Immunologic: Negative for environmental allergies.  Neurological: Negative for dizziness, syncope and headaches.  Hematological: Negative for adenopathy. Does not bruise/bleed easily.  Psychiatric/Behavioral: Negative for decreased concentration and dysphoric mood. The patient is not  nervous/anxious.   All other systems reviewed and are negative.      Objective:   Physical Exam Constitutional:  General: She is not in acute distress.    Appearance: Normal appearance. She is well-developed. She is obese. She is not ill-appearing.  HENT:     Head: Normocephalic and atraumatic.  Eyes:     Conjunctiva/sclera: Conjunctivae normal.     Pupils: Pupils are equal, round, and reactive to light.  Neck:     Thyroid: No thyromegaly.     Vascular: No carotid bruit or JVD.  Cardiovascular:     Rate and Rhythm: Normal rate and regular rhythm.     Heart sounds: Normal heart sounds. No gallop.   Pulmonary:     Effort: Pulmonary effort is normal. No respiratory distress.     Breath sounds: Normal breath sounds. No wheezing or rales.  Abdominal:     General: Bowel sounds are normal. There is no distension or abdominal bruit.     Palpations: Abdomen is soft. There is no mass.     Tenderness: There is no abdominal tenderness.  Musculoskeletal:     Cervical back: Normal range of motion and neck supple.     Right lower leg: No edema.     Left lower leg: No edema.     Comments: L knee-healed incision with mild swelling   Lymphadenopathy:     Cervical: No cervical adenopathy.  Skin:    General: Skin is warm and dry.     Findings: No rash.  Neurological:     Mental Status: She is alert.     Deep Tendon Reflexes: Reflexes are normal and symmetric.  Psychiatric:        Mood and Affect: Mood normal.           Assessment & Plan:   Problem List Items Addressed This Visit      Cardiovascular and Mediastinum   Essential hypertension - Primary    bp in fair control at this time  BP Readings from Last 1 Encounters:  08/03/20 122/84   No changes needed Most recent labs reviewed  Disc lifstyle change with low sodium diet and exercise  K is 3.4 - will add mvi and try to eat more produce -re check next time      Relevant Orders   Comprehensive metabolic panel      Endocrine   Type 2 diabetes mellitus with hyperglycemia (H[facility])    Lab Results  Component Value Date   HGBA1C 7.1 (H) 07/27/2020   Much improvement-almost at goal  Will inc levemir to 45 u daily  Continue low glycemic diet  Gradually inc exercise (s/p TKR) Continue ace and statin  F/u 3 mo      Relevant Medications   insulin detemir (LEVEMIR FLEXTOUCH) 100 UNIT/ML FlexPen   Other Relevant Orders   Hemoglobin A1c   Hyperlipidemia associated with type 2 diabetes mellitus ([place])    Stable with crestor and diet Disc goals for lipids and reasons to control them Rev last labs with pt Rev low sat fat diet in detail Will re check for 3 mo f/u      Relevant Medications   insulin detemir (LEVEMIR FLEXTOUCH) 100 UNIT/ML FlexPen   Other Relevant Orders   Lipid panel

## 2020-08-03 NOTE — Patient Instructions (Addendum)
Potassium is borderline low  Make sure to eat a balanced diet with fruits and vegetable  Add a multi vitamin also    Increase your levemir to 45 units daily  No other changes  Keep watching your diet  Gradually increase exercise as you can   Follow up in 3 months with labs prior

## 2020-08-03 NOTE — Assessment & Plan Note (Signed)
Stable with crestor and diet Disc goals for lipids and reasons to control them Rev last labs with pt Rev low sat fat diet in detail Will re check for 3 mo f/u

## 2020-10-10 ENCOUNTER — Ambulatory Visit (INDEPENDENT_AMBULATORY_CARE_PROVIDER_SITE_OTHER): Payer: Managed Care, Other (non HMO) | Admitting: Family Medicine

## 2020-10-10 ENCOUNTER — Encounter: Payer: Self-pay | Admitting: Family Medicine

## 2020-10-10 ENCOUNTER — Other Ambulatory Visit: Payer: Self-pay

## 2020-10-10 VITALS — BP 110/86 | HR 74 | Temp 97.1°F | Ht 63.75 in | Wt 216.0 lb

## 2020-10-10 DIAGNOSIS — N3090 Cystitis, unspecified without hematuria: Secondary | ICD-10-CM | POA: Diagnosis not present

## 2020-10-10 DIAGNOSIS — B373 Candidiasis of vulva and vagina: Secondary | ICD-10-CM

## 2020-10-10 DIAGNOSIS — R350 Frequency of micturition: Secondary | ICD-10-CM

## 2020-10-10 DIAGNOSIS — B3731 Acute candidiasis of vulva and vagina: Secondary | ICD-10-CM

## 2020-10-10 LAB — POC URINALSYSI DIPSTICK (AUTOMATED)
Blood, UA: NEGATIVE
Glucose, UA: NEGATIVE
Ketones, UA: 5
Nitrite, UA: POSITIVE
Protein, UA: POSITIVE — AB
Spec Grav, UA: >=1.03 — AB (ref 1.010–1.025)
Urobilinogen, UA: 1 E.U./dL
pH, UA: 6 (ref 5.0–8.0)

## 2020-10-10 MED ORDER — FLUCONAZOLE 150 MG PO TABS: 150.0000 mg | ORAL_TABLET | Freq: Once | ORAL | 0 refills | Status: AC

## 2020-10-10 MED ORDER — CEPHALEXIN 500 MG PO CAPS: 500.0000 mg | ORAL_CAPSULE | Freq: Three times a day (TID) | ORAL | 0 refills | Status: AC

## 2020-10-10 NOTE — Progress Notes (Signed)
   Subjective:    Patient ID: Diana Deleon, female    DOB: [birth year], 59 y.o.   MRN: [id number]  HPI Chief Complaint  Patient presents with  . Urinary Frequency    x 1week, pt took AZO last wk. Pt think she may have a yeast infection.    This is a 60 yo female who presents today with above cc. Urinary frequency, no dysuria, no hematuria. Took AZO several days last week. No increased nocturia. No nausea, vomiting or abdominal pain.  Vaginal itching off and on. History of vaginal yeast.  Blood sugars running 140-170. No recent constipation or diarrhea.   Review of Systems Per HPI    Objective:   Physical Exam Physical Exam  Constitutional: She is oriented to person, place, and time. She appears well-developed and well-nourished. No distress.  HENT:  Head: Normocephalic and atraumatic.  Cardiovascular: Normal rate, regular rhythm and normal heart sounds.   Pulmonary/Chest: Effort normal and breath sounds normal.  Abdominal: Soft. She exhibits no distension. There is no tenderness. There is no rebound, no guarding and no CVA tenderness.  Neurological: She is alert and oriented to person, place, and time.  Skin: Skin is warm and dry. She is not diaphoretic.  Psychiatric: She has a normal mood and affect. Her behavior is normal. Judgment and thought content normal.  Vitals reviewed.    BP 110/86   Pulse 74   Temp (!) 97.1 F (36.2 C) (Temporal)   Ht 5' 3.75" (1.619 m)   Wt 216 lb (98 kg)   SpO2 95%   BMI 37.37 kg/m  Wt Readings from Last 3 Encounters:  10/10/20 216 lb (98 kg)  08/03/20 215 lb (97.5 kg)  07/19/20 214 lb 9.6 oz (97.3 kg)   Results for orders placed or performed in visit on 10/10/20  POCT Urinalysis Dipstick (Automated)  Result Value Ref Range   Color, UA straw    Clarity, UA dark    Glucose, UA Negative Negative   Bilirubin, UA 1+    Ketones, UA 5    Spec Grav, UA >=1.030 (A) 1.010 - 1.025   Blood, UA neg    pH, UA 6.0 5.0 - 8.0   Protein, UA Positive  (A) Negative   Urobilinogen, UA 1.0 0.2 or 1.0 E.U./dL   Nitrite, UA Pos    Leukocytes, UA 4+ (A) Negative        Assessment & Plan:  1. Urinary frequency - POCT Urinalysis Dipstick (Automated) - Urine Culture  2. Cystitis - Provided written and verbal information regarding diagnosis and treatment. - Follow up precautions reveiwed  - cephALEXin (KEFLEX) 500 MG capsule; Take 1 capsule (500 mg total) by mouth 3 (three) times daily for 7 days.  Dispense: 21 capsule; Refill: 0  3. Vaginal yeast infection - fluconazole (DIFLUCAN) 150 MG tablet; Take 1 tablet (150 mg total) by mouth once for 1 dose. Repeat if needed in 72 hours.  Dispense: 2 tablet; Refill: 0  This visit occurred during the SARS-CoV-2 public health emergency.  Safety protocols were in place, including screening questions prior to the visit, additional usage of staff PPE, and extensive cleaning of exam room while observing appropriate contact time as indicated for disinfecting solutions.    Clarene Reamer, FNP-BC  [facility]  10/10/2020 2:37 PM

## 2020-10-10 NOTE — Patient Instructions (Signed)
Good to see you today  Follow up if symptoms don't resolve with medication  Hydrate aggressively during the day   Urinary Tract Infection, Adult A urinary tract infection (UTI) is an infection of any part of the urinary tract. The urinary tract includes:  The kidneys.  The ureters.  The bladder.  The urethra. These organs make, store, and get rid of pee (urine) in the body. What are the causes? This is caused by germs (bacteria) in your genital area. These germs grow and cause swelling (inflammation) of your urinary tract. What increases the risk? You are more likely to develop this condition if:  You have a small, thin tube (catheter) to drain pee.  You cannot control when you pee or poop (incontinence).  You are female, and: ? You use these methods to prevent pregnancy:  A medicine that kills sperm (spermicide).  A device that blocks sperm (diaphragm). ? You have low levels of a female hormone (estrogen). ? You are pregnant.  You have genes that add to your risk.  You are sexually active.  You take antibiotic medicines.  You have trouble peeing because of: ? A prostate that is bigger than normal, if you are female. ? A blockage in the part of your body that drains pee from the bladder (urethra). ? A kidney stone. ? A nerve condition that affects your bladder (neurogenic bladder). ? Not getting enough to drink. ? Not peeing often enough.  You have other conditions, such as: ? Diabetes. ? A weak disease-fighting system (immune system). ? Sickle cell disease. ? Gout. ? Injury of the spine. What are the signs or symptoms? Symptoms of this condition include:  Needing to pee right away (urgently).  Peeing often.  Peeing small amounts often.  Pain or burning when peeing.  Blood in the pee.  Pee that smells bad or not like normal.  Trouble peeing.  Pee that is cloudy.  Fluid coming from the vagina, if you are female.  Pain in the belly or lower  back. Other symptoms include:  Throwing up (vomiting).  No urge to eat.  Feeling mixed up (confused).  Being tired and grouchy (irritable).  A fever.  Watery poop (diarrhea). How is this treated? This condition may be treated with:  Antibiotic medicine.  Other medicines.  Drinking enough water. Follow these instructions at home:  Medicines  Take over-the-counter and prescription medicines only as told by your doctor.  If you were prescribed an antibiotic medicine, take it as told by your doctor. Do not stop taking it even if you start to feel better. General instructions  Make sure you: ? Pee until your bladder is empty. ? Do not hold pee for a long time. ? Empty your bladder after sex. ? Wipe from front to back after pooping if you are a female. Use each tissue one time when you wipe.  Drink enough fluid to keep your pee pale yellow.  Keep all follow-up visits as told by your doctor. This is important. Contact a doctor if:  You do not get better after 1-2 days.  Your symptoms go away and then come back. Get help right away if:  You have very bad back pain.  You have very bad pain in your lower belly.  You have a fever.  You are sick to your stomach (nauseous).  You are throwing up. Summary  A urinary tract infection (UTI) is an infection of any part of the urinary tract.  This condition is  caused by germs in your genital area.  There are many risk factors for a UTI. These include having a small, thin tube to drain pee and not being able to control when you pee or poop.  Treatment includes antibiotic medicines for germs.  Drink enough fluid to keep your pee pale yellow. This information is not intended to replace advice given to you by your health care provider. Make sure you discuss any questions you have with your health care provider. Document Revised: 11/20/2018 Document Reviewed: 06/12/2018 Elsevier Patient Education  2020 [employer].

## 2020-10-12 LAB — URINE CULTURE
MICRO NUMBER:: 11113762
SPECIMEN QUALITY:: ADEQUATE

## 2020-10-25 ENCOUNTER — Other Ambulatory Visit: Payer: Self-pay | Admitting: Hematology and Oncology

## 2020-10-27 ENCOUNTER — Other Ambulatory Visit (INDEPENDENT_AMBULATORY_CARE_PROVIDER_SITE_OTHER): Payer: Managed Care, Other (non HMO)

## 2020-10-27 ENCOUNTER — Other Ambulatory Visit: Payer: Self-pay

## 2020-10-27 DIAGNOSIS — E785 Hyperlipidemia, unspecified: Secondary | ICD-10-CM | POA: Diagnosis not present

## 2020-10-27 DIAGNOSIS — E1165 Type 2 diabetes mellitus with hyperglycemia: Secondary | ICD-10-CM | POA: Diagnosis not present

## 2020-10-27 DIAGNOSIS — Z794 Long term (current) use of insulin: Secondary | ICD-10-CM

## 2020-10-27 DIAGNOSIS — E1169 Type 2 diabetes mellitus with other specified complication: Secondary | ICD-10-CM

## 2020-10-27 DIAGNOSIS — I10 Essential (primary) hypertension: Secondary | ICD-10-CM | POA: Diagnosis not present

## 2020-10-27 LAB — COMPREHENSIVE METABOLIC PANEL
ALT: 21 U/L (ref 0–35)
AST: 19 U/L (ref 0–37)
Albumin: 4 g/dL (ref 3.5–5.2)
Alkaline Phosphatase: 98 U/L (ref 39–117)
BUN: 15 mg/dL (ref 6–23)
CO2: 34 mEq/L — ABNORMAL HIGH (ref 19–32)
Calcium: 9.1 mg/dL (ref 8.4–10.5)
Chloride: 100 mEq/L (ref 96–112)
Creatinine, Ser: 1.14 mg/dL (ref 0.40–1.20)
GFR: 53.28 mL/min — ABNORMAL LOW (ref >60.00)
Glucose, Bld: 136 mg/dL — ABNORMAL HIGH (ref 70–99)
Potassium: 3.8 mEq/L (ref 3.5–5.1)
Sodium: 140 mEq/L (ref 135–145)
Total Bilirubin: 0.8 mg/dL (ref 0.2–1.2)
Total Protein: 6.9 g/dL (ref 6.0–8.3)

## 2020-10-27 LAB — LIPID PANEL
Cholesterol: 137 mg/dL (ref 0–200)
HDL: 53.7 mg/dL (ref >39.00)
LDL Cholesterol: 68 mg/dL (ref 0–99)
NonHDL: 83.48
Total CHOL/HDL Ratio: 3
Triglycerides: 78 mg/dL (ref 0.0–149.0)
VLDL: 15.6 mg/dL (ref 0.0–40.0)

## 2020-10-27 LAB — HEMOGLOBIN A1C: Hgb A1c MFr Bld: 7.8 % — ABNORMAL HIGH (ref 4.6–6.5)

## 2020-11-03 ENCOUNTER — Ambulatory Visit (INDEPENDENT_AMBULATORY_CARE_PROVIDER_SITE_OTHER): Payer: Managed Care, Other (non HMO) | Admitting: Family Medicine

## 2020-11-03 ENCOUNTER — Other Ambulatory Visit: Payer: Self-pay

## 2020-11-03 ENCOUNTER — Encounter: Payer: Self-pay | Admitting: Family Medicine

## 2020-11-03 VITALS — BP 128/80 | HR 77 | Temp 96.9°F | Ht 63.75 in | Wt 216.4 lb

## 2020-11-03 DIAGNOSIS — E1165 Type 2 diabetes mellitus with hyperglycemia: Secondary | ICD-10-CM

## 2020-11-03 DIAGNOSIS — I10 Essential (primary) hypertension: Secondary | ICD-10-CM | POA: Diagnosis not present

## 2020-11-03 DIAGNOSIS — M79671 Pain in right foot: Secondary | ICD-10-CM | POA: Diagnosis not present

## 2020-11-03 DIAGNOSIS — Z23 Encounter for immunization: Secondary | ICD-10-CM | POA: Diagnosis not present

## 2020-11-03 DIAGNOSIS — Z794 Long term (current) use of insulin: Secondary | ICD-10-CM

## 2020-11-03 DIAGNOSIS — E785 Hyperlipidemia, unspecified: Secondary | ICD-10-CM

## 2020-11-03 DIAGNOSIS — Z6836 Body mass index (BMI) 36.0-36.9, adult: Secondary | ICD-10-CM

## 2020-11-03 DIAGNOSIS — E1169 Type 2 diabetes mellitus with other specified complication: Secondary | ICD-10-CM

## 2020-11-03 DIAGNOSIS — M79672 Pain in left foot: Secondary | ICD-10-CM

## 2020-11-03 MED ORDER — LEVEMIR FLEXTOUCH 100 UNIT/ML ~~LOC~~ SOPN: 55.0000 [IU] | PEN_INJECTOR | Freq: Every day | SUBCUTANEOUS | 3 refills | Status: DC

## 2020-11-03 NOTE — Assessment & Plan Note (Signed)
bp in fair control at this time  BP Readings from Last 1 Encounters:  11/03/20 128/80   No changes needed Most recent labs reviewed  Disc lifstyle change with low sodium diet and exercise  Plan to continue losartan hct 100-25 mg daily  Metoprolol xl 50 mg daily

## 2020-11-03 NOTE — Progress Notes (Signed)
Subjective:    Patient ID: Diana Deleon, female    DOB: August 25, 1961, 59 y.o.   MRN: [id number]  This visit occurred during the SARS-CoV-2 public health emergency.  Safety protocols were in place, including screening questions prior to the visit, additional usage of staff PPE, and extensive cleaning of exam room while observing appropriate contact time as indicated for disinfecting solutions.    HPI Pt presents for f/u of DM2/chronic medical problems   Wt Readings from Last 3 Encounters:  11/03/20 216 lb 6 oz (98.1 kg)  10/10/20 216 lb (98 kg)  08/03/20 215 lb (97.5 kg)   37.43 kg/m   HTN bp is stable today  No cp or palpitations or headaches or edema  No side effects to medicines  BP Readings from Last 3 Encounters:  11/03/20 (!) 144/90  10/10/20 110/86  08/03/20 122/84    bp was up walking in / was in a hurry  BP: 128/80 re check   Takes losartan hctz 100-25 mg once daily  Metoprolol xl 50 mg daily Pulse Readings from Last 3 Encounters:  11/03/20 77  10/10/20 74  08/03/20 84   Lab Results  Component Value Date   CREATININE 1.14 10/27/2020   BUN 15 10/27/2020   NA 140 10/27/2020   K 3.8 10/27/2020   CL 100 10/27/2020   CO2 34 (H) 10/27/2020   K is ok this check  Glucose of 136  DM2 Lab Results  Component Value Date   HGBA1C 7.8 (H) 10/27/2020  last visit was 7.1 and we inc her levemir to 45 u daily  Also takes glipizide xl 10 mg daily Intol to metformin in the past   She thinks stress is the reason  A little stressful at work and home  (understaffed)  Thinks this will change She does not eat when she is stressed / eats less  occ pc of candy -otherwise no changes   Does not check glucose often  Usually in 160s  Never gets lows   Eye exam was Tuesday    Exercise- not enough  Riding an exercise bike when she can - tries 30 min 3 times per week but has not been able to fit it in  Needs to walk dogs more   Still hot flashes from anastrozole (but  getting a little better)     Hyperlipidemia Lab Results  Component Value Date   CHOL 137 10/27/2020   CHOL 141 04/28/2020   CHOL 153 12/23/2019   Lab Results  Component Value Date   HDL 53.70 10/27/2020   HDL 47.20 04/28/2020   HDL 55.90 12/23/2019   Lab Results  Component Value Date   LDLCALC 68 10/27/2020   LDLCALC 75 04/28/2020   LDLCALC 67 12/23/2019   Lab Results  Component Value Date   TRIG 78.0 10/27/2020   TRIG 97.0 04/28/2020   TRIG 153.0 (H) 12/23/2019   Lab Results  Component Value Date   CHOLHDL 3 10/27/2020   CHOLHDL 3 04/28/2020   CHOLHDL 3 12/23/2019   No results found for: LDLDIRECT Good control with crestor 5 mg daily     Patient Active Problem List   Diagnosis Date Noted  . Foot pain, bilateral 11/03/2020  . S/P TKR (total knee replacement) using cement, left 06/21/2020  . Hyperlipidemia associated with type 2 diabetes mellitus ([place]) 11/02/2018  . Low back pain 03/21/2018  . Osteopenia 01/05/2018  . Aromatase inhibitor use 12/25/2017  . Psoriasis 11/02/2016  . Colon cancer screening  11/02/2016  . Class 2 severe obesity due to excess calories with serious comorbidity and body mass index (BMI) of 36.0 to 36.9 in adult [facility]) 10/31/2015  . History of breast cancer 10/25/2014  . Type 2 diabetes mellitus with hyperglycemia ([place]) 07/14/2013  . Routine general medical examination at a health care facility 10/12/2012  . Hypothyroidism 06/03/2012  . Overactive bladder 05/27/2012  . HSV infection 07/07/2010  . URINARY URGENCY 07/15/2009  . LOW BACK PAIN, CHRONIC 05/17/2008  . HEADACHE, CHRONIC 05/17/2008  . Essential hypertension 02/12/2008   Past Medical History:  Diagnosis Date  . Anemia    "off and on"  . Arthritis    "right toes" (03/24/2015)  . Cancer of right breast ([place]) 2015  . Cyst of cystic duct    chest- central location, taking Cephalexin currently  . Diabetes mellitus without complication ([place])   . Fibroids    hyst. 2006  .  GERD (gastroesophageal reflux disease)   . History of abnormal Pap smear    has had leep in past  . History of cold sores    has used valtrex in past  . History of recurrent UTIs   . Hypertension   . Hypothyroidism   . Intermittent vertigo   . Seborrheic dermatitis    local on R scalp and in ear (failed mult meds- uses steroid spray)  . Thyroid disease    Past Surgical History:  Procedure Laterality Date  . BREAST RECONSTRUCTION WITH PLACEMENT OF TISSUE EXPANDER AND FLEX HD (ACELLULAR HYDRATED DERMIS) Right 11/25/2014   Procedure: RIGHT BREAST RECONSTRUCTION WITH PLACEMENT OF TISSUE EXPANDER AND USE OF ACELLULAR DERMRAL MATRIX ;  Surgeon: Crissie Reese, MD;  Location: [place];  Service: Plastics;  Laterality: Right;  . BREAST REDUCTION SURGERY Left 03/24/2015   Procedure: LEFT BREAST REDUCTION  (BREAST);  Surgeon: Crissie Reese, MD;  Location: [place];  Service: Plastics;  Laterality: Left;  . BREAST SURGERY Right    mastectomy  lymph node removal  . COLONOSCOPY    . COLONOSCOPY WITH PROPOFOL N/A 12/28/2016   Procedure: COLONOSCOPY WITH PROPOFOL;  Surgeon: Jonathon Bellows, MD;  Location: [facility];  Service: Endoscopy;  Laterality: N/A;  . LAPAROSCOPIC CHOLECYSTECTOMY  2003  . LITHOTRIPSY  ~ 2007  . REMOVAL OF TISSUE EXPANDER Right 03/24/2015  . REMOVAL OF TISSUE EXPANDER AND PLACEMENT OF IMPLANT Right 03/24/2015   w/delayed Architect  . REMOVAL OF TISSUE EXPANDER AND PLACEMENT OF IMPLANT Right 03/24/2015   Procedure: REMOVAL OF RIGHT BREAST TISSUE EXPANDER AND DELAYED BREAST RECONSTRUCTION WITH PLACEMENT OF IMPLANT;  Surgeon: Crissie Reese, MD;  Location: [place];  Service: Plastics;  Laterality: Right;  . SIMPLE MASTECTOMY WITH AXILLARY SENTINEL NODE BIOPSY Right 11/25/2014   Procedure: RIGHT TOTAL  MASTECTOMY WITH AXILLARY SENTINEL NODE BIOPSY ;  Surgeon: Excell Seltzer, MD;  Location: [place];  Service: General;  Laterality: Right;  . TOTAL KNEE ARTHROPLASTY Left 06/21/2020   Procedure: LEFT  TOTAL KNEE ARTHROPLASTY;  Surgeon: Hessie Knows, MD;  Location: [facility];  Service: Orthopedics;  Laterality: Left;  . TUBAL LIGATION    . VAGINAL DELIVERY  x3  . VAGINAL HYSTERECTOMY  2006   "fibroids"   Social History   Tobacco Use  . Smoking status: Never Smoker  . Smokeless tobacco: Never Used  Vaping Use  . Vaping Use: Never used  Substance Use Topics  . Alcohol use: No    Alcohol/week: 0.0 standard drinks  . Drug use: No   Family History  Problem  Relation Age of Onset  . Alcohol abuse Father   . COPD Father   . Hypertension Father   . Hypertension Mother   . Parkinson's disease Mother    Allergies  Allergen Reactions  . Metformin And Related Diarrhea  . Norvasc [Amlodipine Besylate] Swelling   Current Outpatient Medications on File Prior to Visit  Medication Sig Dispense Refill  . alendronate (FOSAMAX) 70 MG tablet Take 1 tablet (70 mg total) by mouth every 7 (seven) days. Take with a full glass of water on an empty stomach. 12 tablet 3  . anastrozole (ARIMIDEX) 1 MG tablet Take 1 tablet (1 mg total) by mouth daily. 90 tablet 3  . aspirin EC 81 MG tablet Take 81 mg by mouth daily.    [name] Kitchen augmented betamethasone dipropionate (DIPROLENE-AF) 0.05 % ointment Apply 1 application topically daily.     . Calcium Carb-Cholecalciferol (CALCIUM 600/VITAMIN D3) 600-800 MG-UNIT TABS Take 2 tablets by mouth daily.    . Clobetasol Propionate 0.05 % shampoo Apply 1 application topically once a week.    [name] Kitchen glipiZIDE (GLUCOTROL XL) 10 MG 24 hr tablet Take 1 tablet (10 mg total) by mouth daily with breakfast. 90 tablet 3  . ketoconazole (NIZORAL) 2 % shampoo Apply 1 application topically 2 (two) times a week.    . levothyroxine (SYNTHROID) 50 MCG tablet TAKE 1 TABLET(50 MCG) BY MOUTH DAILY (Patient taking differently: Take 50 mcg by mouth daily before breakfast. ) 90 tablet 3  . losartan-hydrochlorothiazide (HYZAAR) 100-25 MG tablet Take 1 tablet by mouth daily. 90 tablet 3  . meloxicam  (MOBIC) 7.5 MG tablet Take 7.5 mg by mouth daily.    . metoprolol succinate (TOPROL-XL) 50 MG 24 hr tablet Take 1 tablet (50 mg total) by mouth daily. Take with or immediately following a meal. 90 tablet 3  . omeprazole (PRILOSEC) 20 MG capsule TAKE 1 CAPSULE BY MOUTH DAILY BEFORE BREAKFAST (Patient taking differently: Take 20 mg by mouth daily before breakfast. ) 90 capsule 3  . ONETOUCH DELICA LANCETS FINE MISC Use to check blood sugar once daily and as needed (dx. E11.9) 100 each 1  . ONETOUCH VERIO test strip USE TO CHECK BLOOD SUGAR ONCE DAILY AND AS NEEDED 100 each 2  . rosuvastatin (CRESTOR) 5 MG tablet Take 1 tablet (5 mg total) by mouth daily. 90 tablet 3  . valACYclovir (VALTREX) 500 MG tablet Take 1 tablet (500 mg total) by mouth 2 (two) times daily. For 5 days for an outbreak (Patient taking differently: Take 500 mg by mouth 2 (two) times daily as needed (For 5 days for an outbreak). ) 10 tablet 1  . venlafaxine XR (EFFEXOR-XR) 75 MG 24 hr capsule TAKE 1 CAPSULE(75 MG) BY MOUTH DAILY WITH BREAKFAST 90 capsule 1   No current facility-administered medications on file prior to visit.     Review of Systems  Constitutional: Negative for activity change, appetite change, fatigue, fever and unexpected weight change.  HENT: Negative for congestion, ear pain, rhinorrhea, sinus pressure and sore throat.   Eyes: Negative for pain, redness and visual disturbance.  Respiratory: Negative for cough, shortness of breath and wheezing.   Cardiovascular: Negative for chest pain and palpitations.  Gastrointestinal: Negative for abdominal pain, blood in stool, constipation and diarrhea.  Endocrine: Negative for polydipsia and polyuria.  Genitourinary: Negative for dysuria, frequency and urgency.  Musculoskeletal: Positive for arthralgias. Negative for back pain and myalgias.       Foot pain  Skin: Negative for pallor  and rash.       ? Fungal toe nail  Allergic/Immunologic: Negative for environmental  allergies.  Neurological: Negative for dizziness, syncope and headaches.  Hematological: Negative for adenopathy. Does not bruise/bleed easily.  Psychiatric/Behavioral: Negative for decreased concentration and dysphoric mood. The patient is not nervous/anxious.        Objective:   Physical Exam Constitutional:      General: She is not in acute distress.    Appearance: Normal appearance. She is well-developed. She is obese. She is not ill-appearing or diaphoretic.  HENT:     Head: Normocephalic and atraumatic.  Eyes:     General: No scleral icterus.    Conjunctiva/sclera: Conjunctivae normal.     Pupils: Pupils are equal, round, and reactive to light.  Neck:     Thyroid: No thyromegaly.     Vascular: No carotid bruit or JVD.  Cardiovascular:     Rate and Rhythm: Normal rate and regular rhythm.     Heart sounds: Normal heart sounds. No gallop.   Pulmonary:     Effort: Pulmonary effort is normal. No respiratory distress.     Breath sounds: Normal breath sounds. No wheezing or rales.  Abdominal:     General: Bowel sounds are normal. There is no distension or abdominal bruit.     Palpations: Abdomen is soft. There is no mass.     Tenderness: There is no abdominal tenderness.  Musculoskeletal:     Cervical back: Normal range of motion and neck supple.     Right lower leg: No edema.     Left lower leg: No edema.  Lymphadenopathy:     Cervical: No cervical adenopathy.  Skin:    General: Skin is warm and dry.     Coloration: Skin is not pale.     Findings: No erythema or rash.  Neurological:     Mental Status: She is alert.     Sensory: No sensory deficit.     Coordination: Coordination normal.     Deep Tendon Reflexes: Reflexes are normal and symmetric. Reflexes normal.  Psychiatric:        Mood and Affect: Mood normal.            Assessment & Plan:   Problem List Items Addressed This Visit      Cardiovascular and Mediastinum   Essential hypertension    bp in fair  control at this time  BP Readings from Last 1 Encounters:  11/03/20 128/80   No changes needed Most recent labs reviewed  Disc lifstyle change with low sodium diet and exercise  Plan to continue losartan hct 100-25 mg daily  Metoprolol xl 50 mg daily         Endocrine   Type 2 diabetes mellitus with hyperglycemia ([place]) - Primary    Lab Results  Component Value Date   HGBA1C 7.8 (H) 10/27/2020   This may be up due to stress and lack of exercise (not eating differently) Plan to increase levimir to 50 u daily and if no low glucose levels then 55 u daily and update Continue glipizide xl 10 mg daily  Disc plan to fit in exercise  Eye exam was on Tuesday  F/u 3 mo  Taking statin and arb       Relevant Medications   insulin detemir (LEVEMIR FLEXTOUCH) 100 UNIT/ML FlexPen   Hyperlipidemia associated with type 2 diabetes mellitus ([place])    Well controlled with crestor 5 mg daily  LDL is under 8=70  Disc goals for lipids and reasons to control them Rev last labs with pt Rev low sat fat diet in detail        Relevant Medications   insulin detemir (LEVEMIR FLEXTOUCH) 100 UNIT/ML FlexPen     Other   Class 2 severe obesity due to excess calories with serious comorbidity and body mass index (BMI) of 36.0 to 36.9 in adult [facility])    Discussed how this problem influences overall health and the risks it imposes  Reviewed plan for weight loss with lower calorie diet (via better food choices and also portion control or program like weight watchers) and exercise building up to or more than 30 minutes 5 days per week including some aerobic activity         Relevant Medications   insulin detemir (LEVEMIR FLEXTOUCH) 100 UNIT/ML FlexPen   Foot pain, bilateral    Nail thickening and discomfort on L foot  Bunion on R foot (keeps her from walking)  Ref to podiatry for further eval and tx       Relevant Orders   Ambulatory referral to Podiatry    Other Visit Diagnoses    Need for  influenza vaccination       Relevant Orders   Flu Vaccine QUAD 6+ mos PF IM (Fluarix Quad PF) (Completed)

## 2020-11-03 NOTE — Assessment & Plan Note (Signed)
Discussed how this problem influences overall health and the risks it imposes  Reviewed plan for weight loss with lower calorie diet (via better food choices and also portion control or program like weight watchers) and exercise building up to or more than 30 minutes 5 days per week including some aerobic activity    

## 2020-11-03 NOTE — Assessment & Plan Note (Signed)
Nail thickening and discomfort on L foot  Bunion on R foot (keeps her from walking)  Ref to podiatry for further eval and tx

## 2020-11-03 NOTE — Assessment & Plan Note (Signed)
Well controlled with crestor 5 mg daily  LDL is under 8=70 Disc goals for lipids and reasons to control them Rev last labs with pt Rev low sat fat diet in detail

## 2020-11-03 NOTE — Patient Instructions (Addendum)
Keep watching your diet  Increase levemir to 50 u daily and then if glucose levels are not low after a week go up to 55 u daily   Do anything you can to add 30 minutes of exercise per day    Follow up in 3 months   I placed a referral for podiatry  The office will call you to set that up

## 2020-11-03 NOTE — Assessment & Plan Note (Signed)
Lab Results  Component Value Date   HGBA1C 7.8 (H) 10/27/2020   This may be up due to stress and lack of exercise (not eating differently) Plan to increase levimir to 50 u daily and if no low glucose levels then 55 u daily and update Continue glipizide xl 10 mg daily  Disc plan to fit in exercise  Eye exam was on Tuesday  F/u 3 mo  Taking statin and arb

## 2020-11-18 ENCOUNTER — Other Ambulatory Visit: Payer: Self-pay

## 2020-11-18 ENCOUNTER — Ambulatory Visit: Payer: Managed Care, Other (non HMO) | Admitting: Podiatry

## 2020-11-18 ENCOUNTER — Ambulatory Visit (INDEPENDENT_AMBULATORY_CARE_PROVIDER_SITE_OTHER): Payer: Managed Care, Other (non HMO)

## 2020-11-18 ENCOUNTER — Other Ambulatory Visit: Payer: Self-pay | Admitting: Podiatry

## 2020-11-18 ENCOUNTER — Encounter: Payer: Self-pay | Admitting: Podiatry

## 2020-11-18 DIAGNOSIS — M205X1 Other deformities of toe(s) (acquired), right foot: Secondary | ICD-10-CM

## 2020-11-18 DIAGNOSIS — M779 Enthesopathy, unspecified: Secondary | ICD-10-CM

## 2020-11-18 NOTE — Progress Notes (Signed)
   HPI: 59 y.o. female presenting today as a new patient for evaluation of a possible bunion to the right foot.  Patient states that she was diagnosed with a bunion by previous physician.  She is always believe that she has had arthritis in the right great toe joint.  It is only painful and symptomatic intermittently.  She presents for further treatment and evaluation  Past Medical History:  Diagnosis Date  . Anemia    "off and on"  . Arthritis    "right toes" (03/24/2015)  . Cancer of right breast ([place]) 2015  . Cyst of cystic duct    chest- central location, taking Cephalexin currently  . Diabetes mellitus without complication ([place])   . Fibroids    hyst. 2006  . GERD (gastroesophageal reflux disease)   . History of abnormal Pap smear    has had leep in past  . History of cold sores    has used valtrex in past  . History of recurrent UTIs   . Hypertension   . Hypothyroidism   . Intermittent vertigo   . Seborrheic dermatitis    local on R scalp and in ear (failed mult meds- uses steroid spray)  . Thyroid disease      Physical Exam: General: The patient is alert and oriented x3 in no acute distress.  Dermatology: Skin is warm, dry and supple bilateral lower extremities. Negative for open lesions or macerations.  Vascular: Palpable pedal pulses bilaterally. No edema or erythema noted. Capillary refill within normal limits.  Neurological: Epicritic and protective threshold grossly intact bilaterally.   Musculoskeletal Exam:  Muscle strength 5/5 in all groups bilateral.  Limited range of motion with crepitus noted to the first MTPJ right foot.  There is some periarticular bone spurring which is prominent, especially to the medial aspect of the joint.  Radiographic Exam:  Normal osseous mineralization.  Joint space narrowing with periarticular spurring noted to the first MTPJ of the right foot  Assessment: 1.  Hallux limitus right foot   Plan of Care:  1. Patient evaluated.  X-Rays reviewed.  2.  Recommend conservative treatment at the moment.  Patient states that it is only minimally symptomatic intermittently. 3.  Recommend good supportive shoes that do not irritate the great toe. 4.  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 Edrick Kins, DPM    [address], [place] [address]                Office [phone]  Fax [phone]

## 2020-12-21 ENCOUNTER — Other Ambulatory Visit: Payer: Self-pay | Admitting: Family Medicine

## 2021-01-18 ENCOUNTER — Other Ambulatory Visit: Payer: Self-pay | Admitting: Family Medicine

## 2021-01-23 ENCOUNTER — Other Ambulatory Visit: Payer: Self-pay | Admitting: Family Medicine

## 2021-01-24 ENCOUNTER — Other Ambulatory Visit: Payer: Self-pay | Admitting: Family Medicine

## 2021-01-24 NOTE — Telephone Encounter (Signed)
Pharmacy requests refill on: Rosuvastatin 5 mg, Losartan-HCTZ 100-25 mg & Metoprolol Succinate ER 50 mg   LAST REFILL: 02/09/2020 (Q-90, R-3) LAST OV: 11/03/2020 NEXT OV: 02/03/2021 PHARMACY: [id number] [place], [place]

## 2021-02-03 ENCOUNTER — Other Ambulatory Visit: Payer: Self-pay

## 2021-02-03 ENCOUNTER — Encounter: Payer: Self-pay | Admitting: Family Medicine

## 2021-02-03 ENCOUNTER — Ambulatory Visit: Payer: Managed Care, Other (non HMO) | Admitting: Family Medicine

## 2021-02-03 VITALS — BP 136/72 | HR 81 | Temp 96.9°F | Ht 63.75 in | Wt 219.2 lb

## 2021-02-03 DIAGNOSIS — Z794 Long term (current) use of insulin: Secondary | ICD-10-CM

## 2021-02-03 DIAGNOSIS — I10 Essential (primary) hypertension: Secondary | ICD-10-CM | POA: Diagnosis not present

## 2021-02-03 DIAGNOSIS — Z6837 Body mass index (BMI) 37.0-37.9, adult: Secondary | ICD-10-CM

## 2021-02-03 DIAGNOSIS — E1165 Type 2 diabetes mellitus with hyperglycemia: Secondary | ICD-10-CM | POA: Diagnosis not present

## 2021-02-03 LAB — POCT GLYCOSYLATED HEMOGLOBIN (HGB A1C): Hemoglobin A1C: 7.8 % — AB (ref 4.0–5.6)

## 2021-02-03 MED ORDER — METOPROLOL SUCCINATE ER 50 MG PO TB24: 50.0000 mg | ORAL_TABLET | Freq: Every day | ORAL | 3 refills | Status: DC

## 2021-02-03 MED ORDER — GLIPIZIDE ER 10 MG PO TB24: 10.0000 mg | ORAL_TABLET | Freq: Every day | ORAL | 3 refills | Status: DC

## 2021-02-03 MED ORDER — OMEPRAZOLE 20 MG PO CPDR: DELAYED_RELEASE_CAPSULE | ORAL | 3 refills | Status: AC

## 2021-02-03 MED ORDER — ROSUVASTATIN CALCIUM 5 MG PO TABS: 5.0000 mg | ORAL_TABLET | Freq: Every day | ORAL | 3 refills | Status: DC

## 2021-02-03 MED ORDER — LOSARTAN POTASSIUM-HCTZ 100-25 MG PO TABS: 1.0000 | ORAL_TABLET | Freq: Every day | ORAL | 3 refills | Status: DC

## 2021-02-03 MED ORDER — LEVEMIR FLEXTOUCH 100 UNIT/ML ~~LOC~~ SOPN: PEN_INJECTOR | SUBCUTANEOUS | 5 refills | Status: DC

## 2021-02-03 NOTE — Patient Instructions (Addendum)
Go ahead and increase your levemir to 65 units daily (if low glucose let me know)   Increase days of exercise  Try to stick to a diabetic diet  Weight loss would help   Try to get most of your carbohydrates from produce (with the exception of white potatoes)  Eat less bread/pasta/rice/snack foods/cereals/sweets and other items from the middle of the grocery store (processed carbs)   Follow up in 3 months for annual exam

## 2021-02-03 NOTE — Assessment & Plan Note (Signed)
Lab Results  Component Value Date   HGBA1C 7.8 (A) 02/03/2021   No change  Not motivated for diet change/picky eater and at times low appetite  Is exercising more -enc to keep that up  Hesitant to try any of the newer meds that decrease appetite Will inc levemir to 65 u daily (currently 55) Watch for hypoglycemia Send for recent eye exam report  Nl foot exam On arb and statin  F/u 3 mo for annual exam  May consider endocrinology consult Has done dm teaching

## 2021-02-03 NOTE — Assessment & Plan Note (Signed)
bp in fair control at this time  BP Readings from Last 1 Encounters:  02/03/21 136/72   No changes needed Most recent labs reviewed  Disc lifstyle change with low sodium diet and exercise  Plan to continue losartan 100-25 mg daily Metoprolol xl 50 mg daily

## 2021-02-03 NOTE — Progress Notes (Signed)
Subjective:    Patient ID: Diana Deleon, female    DOB: 06/08/1961, 60 y.o.   MRN: [id number]  This visit occurred during the SARS-CoV-2 public health emergency.  Safety protocols were in place, including screening questions prior to the visit, additional usage of staff PPE, and extensive cleaning of exam room while observing appropriate contact time as indicated for disinfecting solutions.    HPI Pt presents for f/u of DM2  Wt Readings from Last 3 Encounters:  02/03/21 219 lb 4 oz (99.5 kg)  11/03/20 216 lb 6 oz (98.1 kg)  10/10/20 216 lb (98 kg)   37.93 kg/m   Last visit a1c was 7.8 (high)  Disc possible cause of less exercise and stress We planned to inc levimir to 50 u daily watching glucose levels Also takes glipizide xl 10 mg daily   Blood sugars are still a little high but better than it was  No problems with levemir  120s to 180   Highest 187  Mornings are lower  Diet is the same with some cheating over the holidays and some birthdays   No snack foods Eats a little bread  , some pasta   Is mindful of it  Not a lot of appetite in general (does go out to eat for lunch)  Picky- does not love vegetables   Tried to increase exercise for a short time - recumbent bike  Did that for 30 minutes daily- now about 3 days per week   Eye exam at [facility] 2 mo ago- and thinks she had some retinopathy Arb and statin   Today Lab Results  Component Value Date   HGBA1C 7.8 (A) 02/03/2021     HTN bp is stable today  No cp or palpitations or headaches or edema  No side effects to medicines  BP Readings from Last 3 Encounters:  02/03/21 136/72  11/03/20 128/80  10/10/20 110/86     Pulse Readings from Last 3 Encounters:  02/03/21 81  11/03/20 77  10/10/20 74   Continues losartan 100-25 mg daily  Metoprolol xl 50 mg daily  Patient Active Problem List   Diagnosis Date Noted  . Foot pain, bilateral 11/03/2020  . S/P TKR (total knee replacement) using cement,  left 06/21/2020  . Osteoarthritis 04/28/2020  . Diabetes mellitus ([place]) 03/01/2020  . Hyperlipidemia associated with type 2 diabetes mellitus ([place]) 11/02/2018  . Low back pain 03/21/2018  . Osteopenia 01/05/2018  . Aromatase inhibitor use 12/25/2017  . Arthralgia 07/03/2017  . Chronic pain of left knee 07/03/2017  . Class 2 severe obesity due to excess calories with serious comorbidity and body mass index (BMI) of 37.0 to 37.9 in adult ([place]) 07/03/2017  . Trigger ring finger of right hand 07/03/2017  . Psoriasis 11/02/2016  . Colon cancer screening 11/02/2016  . Class 2 severe obesity due to excess calories with serious comorbidity and body mass index (BMI) of 36.0 to 36.9 in adult [facility]) 10/31/2015  . History of breast cancer 10/25/2014  . Type 2 diabetes mellitus with hyperglycemia ([place]) 07/14/2013  . Vaginal intraepithelial neoplasia grade 1 07/09/2013  . Routine general medical examination at a health care facility 10/12/2012  . Hypothyroidism 06/03/2012  . Overactive bladder 05/27/2012  . HSV infection 07/07/2010  . URINARY URGENCY 07/15/2009  . LOW BACK PAIN, CHRONIC 05/17/2008  . HEADACHE, CHRONIC 05/17/2008  . Essential hypertension 02/12/2008   Past Medical History:  Diagnosis Date  . Anemia    "off and on"  .  Arthritis    "right toes" (03/24/2015)  . Cancer of right breast ([place]) 2015  . Cyst of cystic duct    chest- central location, taking Cephalexin currently  . Diabetes mellitus without complication ([place])   . Fibroids    hyst. 2006  . GERD (gastroesophageal reflux disease)   . History of abnormal Pap smear    has had leep in past  . History of cold sores    has used valtrex in past  . History of recurrent UTIs   . Hypertension   . Hypothyroidism   . Intermittent vertigo   . Seborrheic dermatitis    local on R scalp and in ear (failed mult meds- uses steroid spray)  . Thyroid disease    Past Surgical History:  Procedure Laterality Date  . BREAST  RECONSTRUCTION WITH PLACEMENT OF TISSUE EXPANDER AND FLEX HD (ACELLULAR HYDRATED DERMIS) Right 11/25/2014   Procedure: RIGHT BREAST RECONSTRUCTION WITH PLACEMENT OF TISSUE EXPANDER AND USE OF ACELLULAR DERMRAL MATRIX ;  Surgeon: Crissie Reese, MD;  Location: [place];  Service: Plastics;  Laterality: Right;  . BREAST REDUCTION SURGERY Left 03/24/2015   Procedure: LEFT BREAST REDUCTION  (BREAST);  Surgeon: Crissie Reese, MD;  Location: [place];  Service: Plastics;  Laterality: Left;  . BREAST SURGERY Right    mastectomy  lymph node removal  . COLONOSCOPY    . COLONOSCOPY WITH PROPOFOL N/A 12/28/2016   Procedure: COLONOSCOPY WITH PROPOFOL;  Surgeon: Jonathon Bellows, MD;  Location: [facility];  Service: Endoscopy;  Laterality: N/A;  . LAPAROSCOPIC CHOLECYSTECTOMY  2003  . LITHOTRIPSY  ~ 2007  . REMOVAL OF TISSUE EXPANDER Right 03/24/2015  . REMOVAL OF TISSUE EXPANDER AND PLACEMENT OF IMPLANT Right 03/24/2015   w/delayed Architect  . REMOVAL OF TISSUE EXPANDER AND PLACEMENT OF IMPLANT Right 03/24/2015   Procedure: REMOVAL OF RIGHT BREAST TISSUE EXPANDER AND DELAYED BREAST RECONSTRUCTION WITH PLACEMENT OF IMPLANT;  Surgeon: Crissie Reese, MD;  Location: [place];  Service: Plastics;  Laterality: Right;  . SIMPLE MASTECTOMY WITH AXILLARY SENTINEL NODE BIOPSY Right 11/25/2014   Procedure: RIGHT TOTAL  MASTECTOMY WITH AXILLARY SENTINEL NODE BIOPSY ;  Surgeon: Excell Seltzer, MD;  Location: [place];  Service: General;  Laterality: Right;  . TOTAL KNEE ARTHROPLASTY Left 06/21/2020   Procedure: LEFT TOTAL KNEE ARTHROPLASTY;  Surgeon: Hessie Knows, MD;  Location: [facility];  Service: Orthopedics;  Laterality: Left;  . TUBAL LIGATION    . VAGINAL DELIVERY  x3  . VAGINAL HYSTERECTOMY  2006   "fibroids"   Social History   Tobacco Use  . Smoking status: Never Smoker  . Smokeless tobacco: Never Used  Vaping Use  . Vaping Use: Never used  Substance Use Topics  . Alcohol use: No    Alcohol/week: 0.0 standard drinks  .  Drug use: No   Family History  Problem Relation Age of Onset  . Alcohol abuse Father   . COPD Father   . Hypertension Father   . Hypertension Mother   . Parkinson's disease Mother    Allergies  Allergen Reactions  . Metformin And Related Diarrhea  . Norvasc [Amlodipine Besylate] Swelling   Current Outpatient Medications on File Prior to Visit  Medication Sig Dispense Refill  . alendronate (FOSAMAX) 70 MG tablet TAKE 1 TABLET BY MOUTH EVERY 7 DAYS. TAKE WITH A FULL GLASS OF WATER ON AN EMPTY STOMACH. 12 tablet 1  . anastrozole (ARIMIDEX) 1 MG tablet Take 1 tablet (1 mg total) by mouth daily. 90 tablet  3  . aspirin EC 81 MG tablet Take 81 mg by mouth daily.    [name] Kitchen augmented betamethasone dipropionate (DIPROLENE-AF) 0.05 % ointment Apply 1 application topically daily.     . Calcium Carb-Cholecalciferol 600-800 MG-UNIT TABS Take 2 tablets by mouth daily.    . Clobetasol Propionate 0.05 % shampoo Apply 1 application topically once a week.    [name] Kitchen ketoconazole (NIZORAL) 2 % shampoo Apply 1 application topically 2 (two) times a week.    . levothyroxine (SYNTHROID) 50 MCG tablet Take 1 tablet (50 mcg total) by mouth daily before breakfast. TAKE 1 TABLET(50 MCG) BY MOUTH DAILY 90 tablet 0  . meloxicam (MOBIC) 7.5 MG tablet Take 7.5 mg by mouth daily.    Glory Rosebush DELICA LANCETS FINE MISC Use to check blood sugar once daily and as needed (dx. E11.9) 100 each 1  . ONETOUCH VERIO test strip USE TO CHECK BLOOD SUGAR ONCE DAILY AND AS NEEDED 100 each 2  . valACYclovir (VALTREX) 500 MG tablet Take 1 tablet (500 mg total) by mouth 2 (two) times daily. For 5 days for an outbreak (Patient taking differently: Take 500 mg by mouth 2 (two) times daily as needed (For 5 days for an outbreak).) 10 tablet 1  . venlafaxine XR (EFFEXOR-XR) 75 MG 24 hr capsule TAKE 1 CAPSULE(75 MG) BY MOUTH DAILY WITH BREAKFAST 90 capsule 1   No current facility-administered medications on file prior to visit.    Review of Systems   Constitutional: Positive for fatigue. Negative for activity change, appetite change, fever and unexpected weight change.  HENT: Negative for congestion, ear pain, rhinorrhea, sinus pressure and sore throat.   Eyes: Negative for pain, redness and visual disturbance.  Respiratory: Negative for cough, shortness of breath and wheezing.   Cardiovascular: Negative for chest pain and palpitations.  Gastrointestinal: Negative for abdominal pain, blood in stool, constipation and diarrhea.  Endocrine: Negative for polydipsia and polyuria.  Genitourinary: Positive for dysuria. Negative for frequency and urgency.  Musculoskeletal: Negative for arthralgias, back pain and myalgias.  Skin: Negative for pallor and rash.  Allergic/Immunologic: Negative for environmental allergies.  Neurological: Negative for dizziness, syncope and headaches.  Hematological: Negative for adenopathy. Does not bruise/bleed easily.  Psychiatric/Behavioral: Negative for decreased concentration and dysphoric mood. The patient is not nervous/anxious.        Lack of motivation        Objective:   Physical Exam Constitutional:      General: She is not in acute distress.    Appearance: Normal appearance. She is well-developed and well-nourished. She is obese. She is not ill-appearing.  HENT:     Head: Normocephalic and atraumatic.     Mouth/Throat:     Mouth: Oropharynx is clear and moist.  Eyes:     General: No scleral icterus.    Extraocular Movements: EOM normal.     Conjunctiva/sclera: Conjunctivae normal.     Pupils: Pupils are equal, round, and reactive to light.  Neck:     Thyroid: No thyromegaly.     Vascular: No carotid bruit or JVD.  Cardiovascular:     Rate and Rhythm: Normal rate and regular rhythm.     Pulses: Normal pulses and intact distal pulses.     Heart sounds: Normal heart sounds. No gallop.   Pulmonary:     Effort: Pulmonary effort is normal. No respiratory distress.     Breath sounds: Normal  breath sounds. No wheezing or rales.     Comments: No crackles  Abdominal:     General: Bowel sounds are normal. There is no distension or abdominal bruit.     Palpations: Abdomen is soft. There is no mass.     Tenderness: There is no abdominal tenderness.  Musculoskeletal:        General: No edema.     Cervical back: Normal range of motion and neck supple.     Right lower leg: No edema.     Left lower leg: No edema.  Lymphadenopathy:     Cervical: No cervical adenopathy.  Skin:    General: Skin is warm and dry.     Findings: No rash.  Neurological:     Mental Status: She is alert.     Sensory: No sensory deficit.     Coordination: Coordination normal.     Deep Tendon Reflexes: Reflexes are normal and symmetric. Reflexes normal.  Psychiatric:        Mood and Affect: Mood and affect and mood normal.           Assessment & Plan:   Problem List Items Addressed This Visit      Cardiovascular and Mediastinum   Essential hypertension    bp in fair control at this time  BP Readings from Last 1 Encounters:  02/03/21 136/72   No changes needed Most recent labs reviewed  Disc lifstyle change with low sodium diet and exercise  Plan to continue losartan 100-25 mg daily Metoprolol xl 50 mg daily      Relevant Medications   losartan-hydrochlorothiazide (HYZAAR) 100-25 MG tablet   metoprolol succinate (TOPROL-XL) 50 MG 24 hr tablet   rosuvastatin (CRESTOR) 5 MG tablet     Endocrine   Type 2 diabetes mellitus with hyperglycemia ([facility]) - Primary    Lab Results  Component Value Date   HGBA1C 7.8 (A) 02/03/2021   No change  Not motivated for diet change/picky eater and at times low appetite  Is exercising more -enc to keep that up  Hesitant to try any of the newer meds that decrease appetite Will inc levemir to 65 u daily (currently 62) Watch for hypoglycemia Send for recent eye exam report  Nl foot exam On arb and statin  F/u 3 mo for annual exam  May consider  endocrinology consult Has done dm teaching       Relevant Medications   glipiZIDE (GLUCOTROL XL) 10 MG 24 hr tablet   losartan-hydrochlorothiazide (HYZAAR) 100-25 MG tablet   rosuvastatin (CRESTOR) 5 MG tablet   insulin detemir (LEVEMIR FLEXTOUCH) 100 UNIT/ML FlexPen   Other Relevant Orders   POCT glycosylated hemoglobin (Hb A1C) (Completed)     Other   Class 2 severe obesity due to excess calories with serious comorbidity and body mass index (BMI) of 37.0 to 37.9 in adult [facility])    Discussed how this problem influences overall health and the risks it imposes  Reviewed plan for weight loss with lower calorie diet (via better food choices and also portion control or program like weight watchers) and exercise building up to or more than 30 minutes 5 days per week including some aerobic activity   No doubt anastrozole adds to this and difficulty loosing  Also lack of motivation (however not particularly depressed)       Relevant Medications   glipiZIDE (GLUCOTROL XL) 10 MG 24 hr tablet   insulin detemir (LEVEMIR FLEXTOUCH) 100 UNIT/ML FlexPen

## 2021-02-03 NOTE — Assessment & Plan Note (Signed)
Discussed how this problem influences overall health and the risks it imposes  Reviewed plan for weight loss with lower calorie diet (via better food choices and also portion control or program like weight watchers) and exercise building up to or more than 30 minutes 5 days per week including some aerobic activity   No doubt anastrozole adds to this and difficulty loosing  Also lack of motivation (however not particularly depressed)

## 2021-03-01 ENCOUNTER — Other Ambulatory Visit: Payer: Self-pay | Admitting: Family Medicine

## 2021-03-02 NOTE — Telephone Encounter (Signed)
Pharmacy requests refill on: Losartan-Hydrochlorothiazide 100-25 mg   LAST REFILL: 02/03/2021 (Q-90, R-3)  LAST OV: 02/03/2021 NEXT OV: 05/17/2021 PHARMACY: [id number] [place], [place]  Earliest Fill Date: 02/03/2022

## 2021-03-14 ENCOUNTER — Telehealth: Payer: Self-pay

## 2021-03-14 NOTE — Telephone Encounter (Signed)
Pt left v/m that [facility] [address] [facility] losartan HCTZ 50 due to 100-25 mg being out on global back order. I spoke with Levada Dy at [facility] and they got a shipment last night of Losartan HCTZ 100-25 mg and they will get ready for pick up. Pt notified as instructed and voiced understanding and appreciative.nothing further needed.

## 2021-03-23 ENCOUNTER — Other Ambulatory Visit: Payer: Self-pay | Admitting: Family Medicine

## 2021-03-28 ENCOUNTER — Other Ambulatory Visit: Payer: Self-pay

## 2021-03-28 ENCOUNTER — Ambulatory Visit: Payer: Managed Care, Other (non HMO) | Admitting: Internal Medicine

## 2021-03-28 ENCOUNTER — Encounter: Payer: Self-pay | Admitting: Internal Medicine

## 2021-03-28 VITALS — BP 126/84 | HR 81 | Temp 97.9°F | Wt 221.0 lb

## 2021-03-28 DIAGNOSIS — R3915 Urgency of urination: Secondary | ICD-10-CM

## 2021-03-28 DIAGNOSIS — E1165 Type 2 diabetes mellitus with hyperglycemia: Secondary | ICD-10-CM

## 2021-03-28 DIAGNOSIS — Z794 Long term (current) use of insulin: Secondary | ICD-10-CM

## 2021-03-28 DIAGNOSIS — R81 Glycosuria: Secondary | ICD-10-CM

## 2021-03-28 LAB — POC URINALSYSI DIPSTICK (AUTOMATED)
Bilirubin, UA: NEGATIVE
Glucose, UA: POSITIVE — AB
Ketones, UA: NEGATIVE
Nitrite, UA: NEGATIVE
Protein, UA: NEGATIVE
Spec Grav, UA: 1.025 (ref 1.010–1.025)
Urobilinogen, UA: 0.2 E.U./dL
pH, UA: 5.5 (ref 5.0–8.0)

## 2021-03-28 NOTE — Progress Notes (Signed)
HPI  Pt presents to the clinic today with c/o urinary urgency. She reports this started 1-2 months ago. [facility], culture grew [place], treated with Macrobid. About 1 week after stopping abx, symptoms returned. She did an Medical laboratory scientific officer through [facility], they treated with Keflex. She reports symptoms improve while on abx, but about 1 week after she comes off, symptoms return. She denies urinary frequency, dysuria, blood in her urine, vaginal symptoms. She denies fever, chills, nausea or low back pain. She has increased her water intake. She has tried AZO OTC. She has had a kidney stones many years ago.   Review of Systems  Past Medical History:  Diagnosis Date  . Anemia    "off and on"  . Arthritis    "right toes" (03/24/2015)  . Cancer of right breast ([place]) 2015  . Cyst of cystic duct    chest- central location, taking Cephalexin currently  . Diabetes mellitus without complication ([place])   . Fibroids    hyst. 2006  . GERD (gastroesophageal reflux disease)   . History of abnormal Pap smear    has had leep in past  . History of cold sores    has used valtrex in past  . History of recurrent UTIs   . Hypertension   . Hypothyroidism   . Intermittent vertigo   . Seborrheic dermatitis    local on R scalp and in ear (failed mult meds- uses steroid spray)  . Thyroid disease     Family History  Problem Relation Age of Onset  . Alcohol abuse Father   . COPD Father   . Hypertension Father   . Hypertension Mother   . Parkinson's disease Mother     Social History   Socioeconomic History  . Marital status: Married    Spouse name: Not on file  . Number of children: Not on file  . Years of education: Not on file  . Highest education level: Not on file  Occupational History  . Not on file  Tobacco Use  . Smoking status: Never Smoker  . Smokeless tobacco: Never Used  Vaping Use  . Vaping Use: Never used  Substance and Sexual Activity  . Alcohol use: No     Alcohol/week: 0.0 standard drinks  . Drug use: No  . Sexual activity: Yes  Other Topics Concern  . Not on file  Social History Narrative  . Not on file   Social Determinants of Health   Financial Resource Strain: Not on file  Food Insecurity: Not on file  Transportation Needs: Not on file  Physical Activity: Not on file  Stress: Not on file  Social Connections: Not on file  Intimate Partner Violence: Not on file    Allergies  Allergen Reactions  . Metformin And Related Diarrhea  . Norvasc [Amlodipine Besylate] Swelling     Constitutional: Denies fever, malaise, fatigue, headache or abrupt weight changes.   GU: Pt reports urgency. Denies frequency, dysuria, burning sensation, blood in urine, odor or discharge. Skin: Denies redness, rashes, lesions or ulcercations.   No other specific complaints in a complete review of systems (except as listed in HPI above).    Objective:   Physical Exam  BP 126/84   Pulse 81   Temp 97.9 F (36.6 C) (Temporal)   Wt 221 lb (100.2 kg)   SpO2 98%   BMI 38.23 kg/m   Wt Readings from Last 3 Encounters:  02/03/21 219 lb 4 oz (99.5 kg)  11/03/20 216 lb 6 oz (98.1 kg)  10/10/20 216 lb (98 kg)    General: Appears her stated age, obese, in NAD. Cardiovascular: Normal rate.   Pulmonary/Chest: Normal effort. Abdomen: Soft and nontender. Normal bowel sounds. No distention or masses noted. No CVA tenderness.        Assessment & Plan:   Urgency:  Urinalysis: trace blood, 2+ glucose Will send urine culture Push fluids Drink plenty of fluids  RTC as needed or if symptoms persist. Webb Silversmith, NP This visit occurred during the SARS-CoV-2 public health emergency.  Safety protocols were in place, including screening questions prior to the visit, additional usage of staff PPE, and extensive cleaning of exam room while observing appropriate contact time as indicated for disinfecting solutions.

## 2021-03-30 LAB — URINE CULTURE
MICRO NUMBER:: 11760219
SPECIMEN QUALITY:: ADEQUATE

## 2021-03-30 MED ORDER — NITROFURANTOIN MONOHYD MACRO 100 MG PO CAPS: 100.0000 mg | ORAL_CAPSULE | Freq: Two times a day (BID) | ORAL | 0 refills | Status: DC

## 2021-03-30 NOTE — Addendum Note (Signed)
Addended by: Jearld Fenton on: 03/30/2021 11:31 AM   Modules accepted: Orders

## 2021-05-02 ENCOUNTER — Other Ambulatory Visit: Payer: Self-pay | Admitting: Family Medicine

## 2021-05-03 NOTE — Telephone Encounter (Signed)
CPE is on 05/17/21, will route to PCP for review to make sure strength and directions are ok, last filled on 02/24/21 #10 tabs with 1 refill

## 2021-05-09 ENCOUNTER — Telehealth: Payer: Self-pay | Admitting: Family Medicine

## 2021-05-09 DIAGNOSIS — I10 Essential (primary) hypertension: Secondary | ICD-10-CM

## 2021-05-09 DIAGNOSIS — E1165 Type 2 diabetes mellitus with hyperglycemia: Secondary | ICD-10-CM

## 2021-05-09 DIAGNOSIS — Z794 Long term (current) use of insulin: Secondary | ICD-10-CM

## 2021-05-09 DIAGNOSIS — Z79899 Other long term (current) drug therapy: Secondary | ICD-10-CM | POA: Insufficient documentation

## 2021-05-09 DIAGNOSIS — E039 Hypothyroidism, unspecified: Secondary | ICD-10-CM

## 2021-05-09 DIAGNOSIS — E1169 Type 2 diabetes mellitus with other specified complication: Secondary | ICD-10-CM

## 2021-05-09 NOTE — Telephone Encounter (Signed)
-----   Message from Ellamae Sia sent at 04/24/2021 11:07 AM EDT ----- Regarding: Lab orders for Wednesday, 5.25.22 Patient is scheduled for CPX labs, please order future labs, Thanks , Karna Christmas

## 2021-05-10 ENCOUNTER — Other Ambulatory Visit (INDEPENDENT_AMBULATORY_CARE_PROVIDER_SITE_OTHER): Payer: Managed Care, Other (non HMO)

## 2021-05-10 ENCOUNTER — Other Ambulatory Visit: Payer: Self-pay

## 2021-05-10 DIAGNOSIS — E785 Hyperlipidemia, unspecified: Secondary | ICD-10-CM | POA: Diagnosis not present

## 2021-05-10 DIAGNOSIS — Z79899 Other long term (current) drug therapy: Secondary | ICD-10-CM | POA: Diagnosis not present

## 2021-05-10 DIAGNOSIS — E1165 Type 2 diabetes mellitus with hyperglycemia: Secondary | ICD-10-CM | POA: Diagnosis not present

## 2021-05-10 DIAGNOSIS — E039 Hypothyroidism, unspecified: Secondary | ICD-10-CM

## 2021-05-10 DIAGNOSIS — Z794 Long term (current) use of insulin: Secondary | ICD-10-CM | POA: Diagnosis not present

## 2021-05-10 DIAGNOSIS — E1169 Type 2 diabetes mellitus with other specified complication: Secondary | ICD-10-CM | POA: Diagnosis not present

## 2021-05-10 DIAGNOSIS — I10 Essential (primary) hypertension: Secondary | ICD-10-CM

## 2021-05-10 LAB — CBC WITH DIFFERENTIAL/PLATELET
Basophils Absolute: 0 10*3/uL (ref 0.0–0.1)
Basophils Relative: 0.7 % (ref 0.0–3.0)
Eosinophils Absolute: 0.1 10*3/uL (ref 0.0–0.7)
Eosinophils Relative: 2.4 % (ref 0.0–5.0)
HCT: 40.9 % (ref 36.0–46.0)
Hemoglobin: 14.4 g/dL (ref 12.0–15.0)
Lymphocytes Relative: 25.5 % (ref 12.0–46.0)
Lymphs Abs: 1.3 10*3/uL (ref 0.7–4.0)
MCHC: 35.3 g/dL (ref 30.0–36.0)
MCV: 88.5 fl (ref 78.0–100.0)
Monocytes Absolute: 0.5 10*3/uL (ref 0.1–1.0)
Monocytes Relative: 10 % (ref 3.0–12.0)
Neutro Abs: 3.1 10*3/uL (ref 1.4–7.7)
Neutrophils Relative %: 61.4 % (ref 43.0–77.0)
Platelets: 217 10*3/uL (ref 150.0–400.0)
RBC: 4.62 Mil/uL (ref 3.87–5.11)
RDW: 13.6 % (ref 11.5–15.5)
WBC: 5.1 10*3/uL (ref 4.0–10.5)

## 2021-05-10 LAB — TSH: TSH: 4.81 u[IU]/mL — ABNORMAL HIGH (ref 0.35–4.50)

## 2021-05-10 LAB — COMPREHENSIVE METABOLIC PANEL
ALT: 19 U/L (ref 0–35)
AST: 17 U/L (ref 0–37)
Albumin: 4.1 g/dL (ref 3.5–5.2)
Alkaline Phosphatase: 97 U/L (ref 39–117)
BUN: 16 mg/dL (ref 6–23)
CO2: 33 mEq/L — ABNORMAL HIGH (ref 19–32)
Calcium: 9.3 mg/dL (ref 8.4–10.5)
Chloride: 99 mEq/L (ref 96–112)
Creatinine, Ser: 1.01 mg/dL (ref 0.40–1.20)
GFR: 61.38 mL/min (ref >60.00)
Glucose, Bld: 113 mg/dL — ABNORMAL HIGH (ref 70–99)
Potassium: 3.5 mEq/L (ref 3.5–5.1)
Sodium: 140 mEq/L (ref 135–145)
Total Bilirubin: 0.8 mg/dL (ref 0.2–1.2)
Total Protein: 6.8 g/dL (ref 6.0–8.3)

## 2021-05-10 LAB — VITAMIN B12: Vitamin B-12: 294 pg/mL (ref 211–911)

## 2021-05-10 LAB — HEMOGLOBIN A1C: Hgb A1c MFr Bld: 7.6 % — ABNORMAL HIGH (ref 4.6–6.5)

## 2021-05-10 LAB — LIPID PANEL
Cholesterol: 147 mg/dL (ref 0–200)
HDL: 52.3 mg/dL (ref >39.00)
LDL Cholesterol: 73 mg/dL (ref 0–99)
NonHDL: 94.87
Total CHOL/HDL Ratio: 3
Triglycerides: 110 mg/dL (ref 0.0–149.0)
VLDL: 22 mg/dL (ref 0.0–40.0)

## 2021-05-10 LAB — VITAMIN D 25 HYDROXY (VIT D DEFICIENCY, FRACTURES): VITD: 24.04 ng/mL — ABNORMAL LOW (ref 30.00–100.00)

## 2021-05-17 ENCOUNTER — Encounter: Payer: Managed Care, Other (non HMO) | Admitting: Family Medicine

## 2021-05-23 ENCOUNTER — Other Ambulatory Visit: Payer: Self-pay | Admitting: Hematology and Oncology

## 2021-05-24 ENCOUNTER — Encounter: Payer: Self-pay | Admitting: Family Medicine

## 2021-06-12 ENCOUNTER — Encounter: Payer: Self-pay | Admitting: Family Medicine

## 2021-06-12 ENCOUNTER — Ambulatory Visit: Payer: Managed Care, Other (non HMO) | Admitting: Family Medicine

## 2021-06-12 ENCOUNTER — Other Ambulatory Visit: Payer: Self-pay

## 2021-06-12 VITALS — BP 146/90 | HR 84 | Temp 97.4°F | Ht 63.75 in | Wt 220.6 lb

## 2021-06-12 DIAGNOSIS — S90829A Blister (nonthermal), unspecified foot, initial encounter: Secondary | ICD-10-CM | POA: Insufficient documentation

## 2021-06-12 DIAGNOSIS — Z794 Long term (current) use of insulin: Secondary | ICD-10-CM

## 2021-06-12 DIAGNOSIS — E1165 Type 2 diabetes mellitus with hyperglycemia: Secondary | ICD-10-CM | POA: Diagnosis not present

## 2021-06-12 NOTE — Patient Instructions (Addendum)
Check your glucose twice daily at least (am fasting and 2 hours after a meal) and vary the times  Keep a log  Write down whenever you feel low sugar symptoms   Get that info to [place]  Check the directions re: fridge for your insulin  You can ask your pharmacist   We may need to cut back the glipizide   Try to set reminders for eating also - for protein (nuts are good)   If the blisters return-get back to dermatology  Don't pop them  Use good sun protection also  Stop the tanning bed

## 2021-06-12 NOTE — Assessment & Plan Note (Signed)
Lab Results  Component Value Date   HGBA1C 7.6 (H) 05/10/2021   This is improved from 7.8 Goal is under 7 occ hypoglycemia with irregular eating  Takes glipizice xl 10 mg daily and levemir 65 units daily  Per pt diet is as optimal as it will get  Disc prev of low glucose inst to check glucose bid and get log to use (with diet as well) in about 2 wk  Consider cutting back glipizide if needed

## 2021-06-12 NOTE — Assessment & Plan Note (Addendum)
W/o h/o trauma in diabetic with sub optimal control  Improved today  Enc protective and well fitting shoes Work on DM care  Avoidance of chemicals  Avoidance of sun  Close monitoring  ? Post inflammatory vesicles poss linked to DM, less likely condition like bullous pemphigoid  If symptoms return strongly enc f/u with derm asap

## 2021-06-12 NOTE — Progress Notes (Signed)
Subjective:    Patient ID: Diana Deleon, female    DOB: [birth year], 60 y.o.   MRN: [id number]  This visit occurred during the SARS-CoV-2 public health emergency.  Safety protocols were in place, including screening questions prior to the visit, additional usage of staff PPE, and extensive cleaning of exam room while observing appropriate contact time as indicated for disinfecting solutions.   HPI Pt presents for c/o blisters on feet   Wt Readings from Last 3 Encounters:  06/12/21 220 lb 9 oz (100 kg)  03/28/21 221 lb (100.2 kg)  02/03/21 219 lb 4 oz (99.5 kg)   38.16 kg/m   This mychart message from 6/8:  Hi. I have a new issue that has come up.  I have gotten a couple of large blisters on the tops of my a couple of my toes.   Please note that I have started a new medication through my Dermatologist. It is Skyrizi for psoriasis.  I had my first shot about 5 weeks ago and my second one on June 2nd.     I'm not sure if the blisters are related to the new medication or something old like the diabetes maybe.  I also realized today that my feet are swelling and very red (which is not normal for me).  I can assure you the blisters are not from the sun.  I reviewed picture of blister on R great toe (dorsal)   She is taking skyrizi for psoriasis  Drug class: Antipsoriatic Agent;  Interleukin-23 Inhibitor;  Monoclonal Antibody  Possible side eff per source include folliculitis , urticaria Post marketing eczema and skin rash   She did see the dermatologist  Did not think it was the drug  Also did not think much about the blisters in general   Got one more blister after she reached out Getting better now  Last one -lateral great toe (smaller)   They were uncomfortable from pressure  She had to pop them (not all of them)  Used bandages and used a little neosporin   No where besides the feet  Went back and forth between closed and open shoes  No friction  No sunburn  No  insect bite that she knew of Not itchy  No hives  No blood - all clear liquid (but has had 2 that did not pop and looked like blood blisters)   She also had a shot in R knee for arthritis (steroid) Had a deep feeling itch around her R breast (one she had surg on)  Used benadryl  Saw derm- and did not think skin related Saw plastics- did not link it to the shot  ? If implant related  Now better   No new products Has a body spray (usually on clothes) Uses a vaseline brand lotion all over - has a scent   Has used tanning bed Went to the [employer] if from diabetes  Real ups and downs  Last visit went up on levemir   She feels shaky if glucose is under 130  A few 70s 80s   Glipizide xl 10 mg   Appetite is not good so she does not eat enough often enough   Lab Results  Component Value Date   HGBA1C 7.6 (H) 05/10/2021  Came down a bit from 7.8  Missed her last appt   Would like to avoid endocrinology ref if possible   Patient Active Problem List   Diagnosis Date Noted  Blister of foot 06/12/2021   Current use of proton pump inhibitor 05/09/2021   Foot pain, bilateral 11/03/2020   S/P TKR (total knee replacement) using cement, left 06/21/2020   Osteoarthritis 04/28/2020   Diabetes mellitus ([place]) 03/01/2020   Hyperlipidemia associated with type 2 diabetes mellitus ([place]) 11/02/2018   Low back pain 03/21/2018   Osteopenia 01/05/2018   Aromatase inhibitor use 12/25/2017   Arthralgia 07/03/2017   Chronic pain of left knee 07/03/2017   Class 2 severe obesity due to excess calories with serious comorbidity and body mass index (BMI) of 37.0 to 37.9 in adult [facility]) 07/03/2017   Trigger ring finger of right hand 07/03/2017   Psoriasis 11/02/2016   Colon cancer screening 11/02/2016   Class 2 severe obesity due to excess calories with serious comorbidity and body mass index (BMI) of 36.0 to 36.9 in adult ([place]) 10/31/2015   History of breast cancer 10/25/2014   Type 2  diabetes mellitus with hyperglycemia ([place]) 07/14/2013   Vaginal intraepithelial neoplasia grade 1 07/09/2013   Routine general medical examination at a health care facility 10/12/2012   Hypothyroidism 06/03/2012   Overactive bladder 05/27/2012   HSV infection 07/07/2010   URINARY URGENCY 07/15/2009   LOW BACK PAIN, CHRONIC 05/17/2008   HEADACHE, CHRONIC 05/17/2008   Essential hypertension 02/12/2008   Past Medical History:  Diagnosis Date   Anemia    "off and on"   Arthritis    "right toes" (03/24/2015)   Cancer of right breast ([place]) 2015   Cyst of cystic duct    chest- central location, taking Cephalexin currently   Diabetes mellitus without complication ([place])    Fibroids    hyst. 2006   GERD (gastroesophageal reflux disease)    History of abnormal Pap smear    has had leep in past   History of cold sores    has used valtrex in past   History of recurrent UTIs    Hypertension    Hypothyroidism    Intermittent vertigo    Seborrheic dermatitis    local on R scalp and in ear (failed mult meds- uses steroid spray)   Thyroid disease    Past Surgical History:  Procedure Laterality Date   BREAST RECONSTRUCTION WITH PLACEMENT OF TISSUE EXPANDER AND FLEX HD (ACELLULAR HYDRATED DERMIS) Right 11/25/2014   Procedure: RIGHT BREAST RECONSTRUCTION WITH PLACEMENT OF TISSUE EXPANDER AND USE OF ACELLULAR DERMRAL MATRIX ;  Surgeon: Crissie Reese, MD;  Location: [place];  Service: Plastics;  Laterality: Right;   BREAST REDUCTION SURGERY Left 03/24/2015   Procedure: LEFT BREAST REDUCTION  (BREAST);  Surgeon: Crissie Reese, MD;  Location: [place];  Service: Plastics;  Laterality: Left;   BREAST SURGERY Right    mastectomy  lymph node removal   COLONOSCOPY     COLONOSCOPY WITH PROPOFOL N/A 12/28/2016   Procedure: COLONOSCOPY WITH PROPOFOL;  Surgeon: Jonathon Bellows, MD;  Location: [facility];  Service: Endoscopy;  Laterality: N/A;   LAPAROSCOPIC CHOLECYSTECTOMY  2003   LITHOTRIPSY  ~ 2007   REMOVAL OF  TISSUE EXPANDER Right 03/24/2015   REMOVAL OF TISSUE EXPANDER AND PLACEMENT OF IMPLANT Right 03/24/2015   w/delayed construction   REMOVAL OF TISSUE EXPANDER AND PLACEMENT OF IMPLANT Right 03/24/2015   Procedure: REMOVAL OF RIGHT BREAST TISSUE EXPANDER AND DELAYED BREAST RECONSTRUCTION WITH PLACEMENT OF IMPLANT;  Surgeon: Crissie Reese, MD;  Location: [place];  Service: Plastics;  Laterality: Right;   SIMPLE MASTECTOMY WITH AXILLARY SENTINEL NODE BIOPSY Right 11/25/2014   Procedure:  RIGHT TOTAL  MASTECTOMY WITH AXILLARY SENTINEL NODE BIOPSY ;  Surgeon: Excell Seltzer, MD;  Location: [place];  Service: General;  Laterality: Right;   TOTAL KNEE ARTHROPLASTY Left 06/21/2020   Procedure: LEFT TOTAL KNEE ARTHROPLASTY;  Surgeon: Hessie Knows, MD;  Location: [facility];  Service: Orthopedics;  Laterality: Left;   TUBAL LIGATION     VAGINAL DELIVERY  x3   VAGINAL HYSTERECTOMY  2006   "fibroids"   Social History   Tobacco Use   Smoking status: Never   Smokeless tobacco: Never  Vaping Use   Vaping Use: Never used  Substance Use Topics   Alcohol use: No    Alcohol/week: 0.0 standard drinks   Drug use: No   Family History  Problem Relation Age of Onset   Alcohol abuse Father    COPD Father    Hypertension Father    Hypertension Mother    Parkinson's disease Mother    Allergies  Allergen Reactions   Metformin And Related Diarrhea   Norvasc [Amlodipine Besylate] Swelling   Current Outpatient Medications on File Prior to Visit  Medication Sig Dispense Refill   alendronate (FOSAMAX) 70 MG tablet TAKE 1 TABLET BY MOUTH EVERY 7 DAYS. TAKE WITH A FULL GLASS OF WATER ON AN EMPTY STOMACH. 12 tablet 1   anastrozole (ARIMIDEX) 1 MG tablet Take 1 tablet (1 mg total) by mouth daily. 90 tablet 3   aspirin EC 81 MG tablet Take 81 mg by mouth daily.     augmented betamethasone dipropionate (DIPROLENE-AF) 0.05 % ointment Apply 1 application topically daily.      Calcium Carb-Cholecalciferol 600-800 MG-UNIT TABS  Take 2 tablets by mouth daily.     Clobetasol Propionate 0.05 % shampoo Apply 1 application topically once a week.     glipiZIDE (GLUCOTROL XL) 10 MG 24 hr tablet Take 1 tablet (10 mg total) by mouth daily with breakfast. 90 tablet 3   insulin detemir (LEVEMIR FLEXTOUCH) 100 UNIT/ML FlexPen INJECT 65 units into skin at bedtime 15 mL 5   ketoconazole (NIZORAL) 2 % shampoo Apply 1 application topically 2 (two) times a week.     levothyroxine (SYNTHROID) 50 MCG tablet TAKE 1 TABLET(50 MCG) BY MOUTH DAILY 90 tablet 0   losartan-hydrochlorothiazide (HYZAAR) 100-25 MG tablet Take 1 tablet by mouth daily. 90 tablet 3   meloxicam (MOBIC) 7.5 MG tablet Take 7.5 mg by mouth daily.     metoprolol succinate (TOPROL-XL) 50 MG 24 hr tablet Take 1 tablet (50 mg total) by mouth daily. Take with or immediately following a meal. 90 tablet 3   omeprazole (PRILOSEC) 20 MG capsule TAKE 1 CAPSULE BY MOUTH DAILY BEFORE BREAKFAST 90 capsule 3   ONETOUCH DELICA LANCETS FINE MISC Use to check blood sugar once daily and as needed (dx. E11.9) 100 each 1   ONETOUCH VERIO test strip USE TO CHECK BLOOD SUGAR ONCE DAILY AND AS NEEDED 100 each 2   Risankizumab-rzaa (SKYRIZI, 150 MG DOSE, [place]) Inject into the skin. Take as directed (every 3 month)     rosuvastatin (CRESTOR) 5 MG tablet Take 1 tablet (5 mg total) by mouth daily. 90 tablet 3   valACYclovir (VALTREX) 500 MG tablet TAKE 1 TABLET(500 MG) BY MOUTH TWICE DAILY FOR 5 DAYS FOR OUTBREAK 10 tablet 1   venlafaxine XR (EFFEXOR-XR) 75 MG 24 hr capsule TAKE 1 CAPSULE(75 MG) BY MOUTH DAILY WITH BREAKFAST 90 capsule 1   No current facility-administered medications on file prior to visit.  Review of Systems  Constitutional:  Positive for fatigue. Negative for activity change, appetite change, fever and unexpected weight change.  HENT:  Negative for congestion, ear pain, rhinorrhea, sinus pressure and sore throat.   Eyes:  Negative for pain, redness and visual disturbance.   Respiratory:  Negative for cough, shortness of breath and wheezing.   Cardiovascular:  Negative for chest pain and palpitations.  Gastrointestinal:  Negative for abdominal pain, blood in stool, constipation and diarrhea.  Endocrine: Negative for polydipsia and polyuria.  Genitourinary:  Negative for dysuria, frequency and urgency.  Musculoskeletal:  Negative for arthralgias, back pain and myalgias.  Skin:  Positive for wound. Negative for pallor and rash.  Allergic/Immunologic: Negative for environmental allergies.  Neurological:  Negative for dizziness, syncope and headaches.  Hematological:  Negative for adenopathy. Does not bruise/bleed easily.  Psychiatric/Behavioral:  Negative for decreased concentration and dysphoric mood. The patient is not nervous/anxious.       Objective:   Physical Exam Constitutional:      General: She is not in acute distress.    Appearance: Normal appearance. She is well-developed. She is obese. She is not ill-appearing or diaphoretic.  HENT:     Head: Normocephalic and atraumatic.  Eyes:     Conjunctiva/sclera: Conjunctivae normal.     Pupils: Pupils are equal, round, and reactive to light.  Neck:     Thyroid: No thyromegaly.     Vascular: No carotid bruit or JVD.  Cardiovascular:     Rate and Rhythm: Normal rate and regular rhythm.     Heart sounds: Normal heart sounds.    No gallop.  Pulmonary:     Effort: Pulmonary effort is normal. No respiratory distress.     Breath sounds: Normal breath sounds. No wheezing or rales.  Abdominal:     General: Bowel sounds are normal. There is no distension or abdominal bruit.     Palpations: Abdomen is soft. There is no mass.     Tenderness: There is no abdominal tenderness.  Musculoskeletal:     Cervical back: Normal range of motion and neck supple.     Right lower leg: No edema.     Left lower leg: No edema.  Lymphadenopathy:     Cervical: No cervical adenopathy.  Skin:    General: Skin is warm and  dry.     Coloration: Skin is not pale.     Findings: No rash.     Comments: Healed blisters R great toe Lateral L great toe Top of L 4th toe  2 small dark marks on 2nd toes bilat (? Healed blood blisters) Nl sensation  Tanned but good foot care  Neurological:     Mental Status: She is alert.     Coordination: Coordination normal.     Deep Tendon Reflexes: Reflexes are normal and symmetric. Reflexes normal.  Psychiatric:        Mood and Affect: Mood normal.          Assessment & Plan:   Problem List Items Addressed This Visit       Endocrine   Type 2 diabetes mellitus with hyperglycemia ([place])    Lab Results  Component Value Date   HGBA1C 7.6 (H) 05/10/2021  This is improved from 7.8 Goal is under 7 occ hypoglycemia with irregular eating  Takes glipizice xl 10 mg daily and levemir 65 units daily  Per pt diet is as optimal as it will get  Disc prev of low glucose inst to check  glucose bid and get log to use (with diet as well) in about 2 wk  Consider cutting back glipizide if needed          Musculoskeletal and Integument   Blister of foot - Primary    W/o h/o trauma in diabetic with sub optimal control  Improved today  Enc protective and well fitting shoes Work on DM care  Avoidance of chemicals  Avoidance of sun  Close monitoring  ? Post inflammatory vesicles poss linked to DM, less likely condition like bullous pemphigoid  If symptoms return strongly enc f/u with derm asap

## 2021-07-02 ENCOUNTER — Other Ambulatory Visit: Payer: Self-pay | Admitting: Family Medicine

## 2021-07-03 ENCOUNTER — Encounter: Payer: Self-pay | Admitting: Family Medicine

## 2021-07-05 ENCOUNTER — Other Ambulatory Visit: Payer: Managed Care, Other (non HMO)

## 2021-07-05 MED ORDER — CIPROFLOXACIN HCL 250 MG PO TABS: 250.0000 mg | ORAL_TABLET | Freq: Two times a day (BID) | ORAL | 0 refills | Status: DC

## 2021-07-05 NOTE — Telephone Encounter (Signed)
Pt will drop off urine sample this afternoon. She did say that her last urine cx showed a few abx would clear up her UTI but her sxs didn't resolve until PCP prescribed cipro, pt is asking if PCP would send in Cipro if she thinks it's okay

## 2021-07-06 ENCOUNTER — Other Ambulatory Visit (INDEPENDENT_AMBULATORY_CARE_PROVIDER_SITE_OTHER): Payer: Managed Care, Other (non HMO)

## 2021-07-06 ENCOUNTER — Other Ambulatory Visit: Payer: Self-pay

## 2021-07-06 DIAGNOSIS — R3915 Urgency of urination: Secondary | ICD-10-CM

## 2021-07-08 LAB — URINE CULTURE
MICRO NUMBER:: 12147341
SPECIMEN QUALITY:: ADEQUATE

## 2021-07-18 NOTE — Progress Notes (Signed)
Patient Care Team: Tower, Wynelle Fanny, MD as PCP - General Nicholas Lose, MD as Consulting Physician (Hematology and Oncology) Excell Seltzer, MD (Inactive) as Consulting Physician (General Surgery) Eppie Gibson, MD as Attending Physician (Radiation Oncology) Holley Bouche, NP (Inactive) as Nurse Practitioner (Nurse Practitioner)  DIAGNOSIS:    ICD-10-CM   1. History of breast cancer  Z85.3       SUMMARY OF ONCOLOGIC HISTORY: Oncology History  History of breast cancer  10/20/2014 Initial Diagnosis   Right breast invasive and in situ mammary cancer grade 2 E-cadherin positive, invasive ductal carcinoma with DCIS focal atypical cells negative for atypia and ER 70% PR 80% HER-2 negative ratio 1.26 Ki-67 27%    10/22/2014 Breast MRI   Right breast upper outer quadrant 2.3 cm biopsy-proven malignancy    11/25/2014 Surgery   Right simple mastectomy: Tumor #1 upper lateral invasive high-grade ductal carcinoma 2.8 cm plus high-grade DCIS plus ALH and LCIS; tumor #2 upper medial DCIS 1.5 cm. Margins negative.    11/25/2014 Oncotype testing   18 (11% 10-year risk of distant recurrence on Tamoxifen alone).     01/03/2015 -  Anti-estrogen oral therapy   Tamoxifen  20 mg daily discontinued 03/28/2015 and switched to anastrozole 1 mg 03/23/15 daily when she was found to be postmenopausal. Plan treatment duration 5 years    03/24/2015 Surgery   Left breast mammoplasty: Benign      CHIEF COMPLIANT: Follow-up of right breast cancer on anastrozole therapy  INTERVAL HISTORY: Diana Deleon is a 60 y.o. with above-mentioned history of right breast cancer treated with mastectomy and who is currently on anti-estrogen therapy with anastrozole. She presents to the clinic today for annual follow-up.  Her major complaint is hot flashes which are much better with Effexor.  She also has dry skin and dry mouth.  For couple of months earlier this year she had profound itching which finally got better  with some Benadryl.  Denies any pain or discomfort in the breast.  [facility] month of [facility] or April and they have been fine.  She is a diabetic and is working hard to lower her A1c.  ALLERGIES:  is allergic to metformin and related and norvasc [amlodipine besylate].  MEDICATIONS:  Current Outpatient Medications  Medication Sig Dispense Refill   alendronate (FOSAMAX) 70 MG tablet TAKE 1 TABLET BY MOUTH EVERY 7 DAYS. TAKE WITH A FULL GLASS OF WATER ON AN EMPTY STOMACH. 12 tablet 1   anastrozole (ARIMIDEX) 1 MG tablet Take 1 tablet (1 mg total) by mouth daily. 90 tablet 0   aspirin EC 81 MG tablet Take 81 mg by mouth daily.     augmented betamethasone dipropionate (DIPROLENE-AF) 0.05 % ointment Apply 1 application topically daily.      Calcium Carb-Cholecalciferol 600-800 MG-UNIT TABS Take 2 tablets by mouth daily.     Clobetasol Propionate 0.05 % shampoo Apply 1 application topically once a week.     glipiZIDE (GLUCOTROL XL) 10 MG 24 hr tablet Take 1 tablet (10 mg total) by mouth daily with breakfast. 90 tablet 3   insulin detemir (LEVEMIR FLEXTOUCH) 100 UNIT/ML FlexPen INJECT 65 units into skin at bedtime 15 mL 5   ketoconazole (NIZORAL) 2 % shampoo Apply 1 application topically 2 (two) times a week.     levothyroxine (SYNTHROID) 50 MCG tablet TAKE 1 TABLET(50 MCG) BY MOUTH DAILY 90 tablet 0   losartan-hydrochlorothiazide (HYZAAR) 100-25 MG tablet Take 1  tablet by mouth daily. 90 tablet 3   meloxicam (MOBIC) 7.5 MG tablet Take 7.5 mg by mouth daily.     metoprolol succinate (TOPROL-XL) 50 MG 24 hr tablet Take 1 tablet (50 mg total) by mouth daily. Take with or immediately following a meal. 90 tablet 3   omeprazole (PRILOSEC) 20 MG capsule TAKE 1 CAPSULE BY MOUTH DAILY BEFORE BREAKFAST 90 capsule 3   ONETOUCH DELICA LANCETS FINE MISC Use to check blood sugar once daily and as needed (dx. E11.9) 100 each 1   ONETOUCH VERIO test strip USE TO CHECK  BLOOD SUGAR ONCE DAILY AND AS NEEDED 100 each 2   Risankizumab-rzaa (SKYRIZI, 150 MG DOSE, [place]) Inject into the skin. Take as directed (every 3 month)     rosuvastatin (CRESTOR) 5 MG tablet Take 1 tablet (5 mg total) by mouth daily. 90 tablet 3   valACYclovir (VALTREX) 500 MG tablet TAKE 1 TABLET(500 MG) BY MOUTH TWICE DAILY FOR 5 DAYS FOR OUTBREAK 10 tablet 1   venlafaxine XR (EFFEXOR-XR) 75 MG 24 hr capsule TAKE 1 CAPSULE(75 MG) BY MOUTH DAILY WITH BREAKFAST 90 capsule 3   No current facility-administered medications for this visit.    PHYSICAL EXAMINATION: ECOG PERFORMANCE STATUS: 1 - Symptomatic but completely ambulatory  Vitals:   07/19/21 0830  BP: (!) 145/79  Pulse: 71  Resp: 18  Temp: 97.8 F (36.6 C)  SpO2: 99%   Filed Weights   07/19/21 0830  Weight: 219 lb 3.2 oz (99.4 kg)    BREAST: No palpable masses or nodules in either right or left breasts.  Right breast has been reconstructed.  No palpable axillary supraclavicular or infraclavicular adenopathy no breast tenderness or nipple discharge. (exam performed in the presence of a chaperone)  LABORATORY DATA:  I have reviewed the data as listed CMP Latest Ref Rng & Units 05/10/2021 10/27/2020 07/27/2020  Glucose 70 - 99 mg/dL 113(H) 136(H) 149(H)  BUN 6 - 23 mg/dL '16 15 13  ' Creatinine 0.40 - 1.20 mg/dL 1.01 1.14 0.94  Sodium 135 - 145 mEq/L 140 140 141  Potassium 3.5 - 5.1 mEq/L 3.5 3.8 3.4(L)  Chloride 96 - 112 mEq/L 99 100 99  CO2 19 - 32 mEq/L 33(H) 34(H) 30  Calcium 8.4 - 10.5 mg/dL 9.3 9.1 10.0  Total Protein 6.0 - 8.3 g/dL 6.8 6.9 7.5  Total Bilirubin 0.2 - 1.2 mg/dL 0.8 0.8 0.6  Alkaline Phos 39 - 117 U/L 97 98 100  AST 0 - 37 U/L '17 19 19  ' ALT 0 - 35 U/L '19 21 19    ' Lab Results  Component Value Date   WBC 5.1 05/10/2021   HGB 14.4 05/10/2021   HCT 40.9 05/10/2021   MCV 88.5 05/10/2021   PLT 217.0 05/10/2021   NEUTROABS 3.1 05/10/2021    ASSESSMENT & PLAN:  History of breast cancer   Right breast  invasive ductal carcinoma 2.8 cm status post mastectomy grade 3 with high-grade DCIS, ALH and LCIS; separate tumor which was DCIS 1.5 cm, 1 SLN negative, ER 86%, PR 95%, HER-2 negative ratio 1.52, Ki-67 27% T2 N0 M0 stage II a, Oncotype Dx score 18 (11% ROR) Low risk; started tamoxifen 12/27/2014  Switched to anastrozole April 2016 she will be completing it by December 2022.   Anastrozole Toxicities: Severe hot flashes On Effexor.Markedly improved: I sent another years worth of refills on Effexor.    Bone density 01/02/2018: T score -2.3: Osteopenia Takes Fosamax   Her mother passed  away in September 2020     Breast Cancer Surveillance: 1. Breast exam 07/19/2021: Benign 2. [facility]     RTC on an as-needed basis      No orders of the defined types were placed in this encounter.  The patient has a good understanding of the overall plan. she agrees with it. she will call with any problems that may develop before the next visit here.  Total time spent: 20 mins including face to face time and time spent for planning, charting and coordination of care  Rulon Eisenmenger, MD, MPH 07/19/2021  I, Thana Ates, am acting as scribe for Dr. Nicholas Lose.  I have reviewed the above documentation for accuracy and completeness, and I agree with the above.

## 2021-07-18 NOTE — Assessment & Plan Note (Signed)
  Right breast invasive ductal carcinoma 2.8 cm status post mastectomy grade 3 with high-grade DCIS, ALH and LCIS; separate tumor which was DCIS 1.5 cm, 1 SLN negative, ER 86%, PR 95%, HER-2 negative ratio 1.52, Ki-67 27% T2 N0 M0 stage II a, Oncotype Dx score 18 (11% ROR) Low risk; started tamoxifen 12/27/2014 Switched to anastrozole April 2016  Anastrozole Toxicities: 1. Severe hot flashes On Effexor.Markedly improved   Recent left knee replacement surgery: Recovering from it.   Bone density 01/02/2018: T score -2.3: Osteopenia I recommended bisphosphonate therapy Takes Fosamax  Her mother passed away in 2019/09/19   Breast Cancer Surveillance: 1. Breast exam8/02/2021 normal 2. [facility].   RTC in 1 year for follow up.

## 2021-07-19 ENCOUNTER — Other Ambulatory Visit: Payer: Self-pay

## 2021-07-19 ENCOUNTER — Inpatient Hospital Stay: Payer: Managed Care, Other (non HMO) | Attending: Hematology and Oncology | Admitting: Hematology and Oncology

## 2021-07-19 DIAGNOSIS — Z853 Personal history of malignant neoplasm of breast: Secondary | ICD-10-CM | POA: Diagnosis not present

## 2021-07-19 DIAGNOSIS — Z79899 Other long term (current) drug therapy: Secondary | ICD-10-CM | POA: Insufficient documentation

## 2021-07-19 DIAGNOSIS — R232 Flushing: Secondary | ICD-10-CM | POA: Diagnosis not present

## 2021-07-19 MED ORDER — VENLAFAXINE HCL ER 75 MG PO CP24: ORAL_CAPSULE | ORAL | 3 refills | Status: DC

## 2021-07-19 MED ORDER — ANASTROZOLE 1 MG PO TABS: 1.0000 mg | ORAL_TABLET | Freq: Every day | ORAL | 0 refills | Status: DC

## 2021-08-28 ENCOUNTER — Encounter (INDEPENDENT_AMBULATORY_CARE_PROVIDER_SITE_OTHER): Payer: Self-pay

## 2021-09-01 ENCOUNTER — Ambulatory Visit (INDEPENDENT_AMBULATORY_CARE_PROVIDER_SITE_OTHER): Payer: Managed Care, Other (non HMO)

## 2021-09-01 ENCOUNTER — Other Ambulatory Visit: Payer: Self-pay

## 2021-09-01 ENCOUNTER — Ambulatory Visit: Payer: Managed Care, Other (non HMO) | Admitting: Podiatry

## 2021-09-01 DIAGNOSIS — M205X1 Other deformities of toe(s) (acquired), right foot: Secondary | ICD-10-CM | POA: Diagnosis not present

## 2021-09-02 ENCOUNTER — Other Ambulatory Visit: Payer: Self-pay | Admitting: Family Medicine

## 2021-09-04 ENCOUNTER — Telehealth: Payer: Self-pay

## 2021-09-04 NOTE — Telephone Encounter (Signed)
Received surgery paperwork from the [place] office. Left a message for Faatima to call to schedule surgery.

## 2021-09-05 ENCOUNTER — Telehealth: Payer: Self-pay | Admitting: Family Medicine

## 2021-09-05 DIAGNOSIS — Z794 Long term (current) use of insulin: Secondary | ICD-10-CM

## 2021-09-05 DIAGNOSIS — I10 Essential (primary) hypertension: Secondary | ICD-10-CM

## 2021-09-05 DIAGNOSIS — E1165 Type 2 diabetes mellitus with hyperglycemia: Secondary | ICD-10-CM

## 2021-09-05 DIAGNOSIS — E039 Hypothyroidism, unspecified: Secondary | ICD-10-CM

## 2021-09-05 NOTE — Telephone Encounter (Signed)
-----   Message from Ellamae Sia sent at 08/22/2021 11:24 AM EDT ----- Regarding: Lab orders for Wednesday, 9.21.22 Lab orders for a 3 month follow up appt.

## 2021-09-06 ENCOUNTER — Other Ambulatory Visit: Payer: Managed Care, Other (non HMO)

## 2021-09-08 NOTE — Progress Notes (Signed)
   HPI: 60 y.o. female presenting today for follow-up evaluation of hallux limitus to the right foot.  Patient states that since last visit on 11/18/2020 she has continued to have pain and tenderness to the right great toe joint.  She would like to discuss surgery today if possible.  She presents for further treatment and evaluation.  Conservative treatments have not provided any sort of lasting satisfactory alleviation of symptoms for the patient  Past Medical History:  Diagnosis Date   Anemia    "off and on"   Arthritis    "right toes" (03/24/2015)   Cancer of right breast ([place]) 2015   Cyst of cystic duct    chest- central location, taking Cephalexin currently   Diabetes mellitus without complication ([place])    Fibroids    hyst. 2006   GERD (gastroesophageal reflux disease)    History of abnormal Pap smear    has had leep in past   History of cold sores    has used valtrex in past   History of recurrent UTIs    Hypertension    Hypothyroidism    Intermittent vertigo    Seborrheic dermatitis    local on R scalp and in ear (failed mult meds- uses steroid spray)   Thyroid disease      Physical Exam: General: The patient is alert and oriented x3 in no acute distress.  Dermatology: Skin is warm, dry and supple bilateral lower extremities. Negative for open lesions or macerations.  Vascular: Palpable pedal pulses bilaterally. No edema or erythema noted. Capillary refill within normal limits.  Neurological: Epicritic and protective threshold grossly intact bilaterally.   Musculoskeletal Exam:  Muscle strength 5/5 in all groups bilateral.  Limited range of motion with crepitus noted to the first MTPJ right foot.  There is some periarticular bone spurring which is prominent, especially to the medial aspect of the joint.  There continues to be pain on palpation and range of motion to the joint  Radiographic Exam:  Normal osseous mineralization.  Joint space narrowing with periarticular  spurring noted to the first MTPJ of the right foot  Assessment: 1.  Hallux limitus right foot   Plan of Care:  1. Patient evaluated. X-Rays reviewed.  2. Today we discussed the conservative versus surgical management of the presenting pathology. The patient opts for surgical management. All possible complications and details of the procedure were explained. All patient questions were answered. No guarantees were expressed or implied. 3. Authorization for surgery was initiated today. Surgery will consist of first MTPJ arthroplasty with implant right 4.  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 Edrick Kins, DPM    [address], [place] [address]                Office [phone]  Fax [phone]

## 2021-09-13 ENCOUNTER — Encounter: Payer: Self-pay | Admitting: Family Medicine

## 2021-09-13 ENCOUNTER — Other Ambulatory Visit: Payer: Self-pay

## 2021-09-13 ENCOUNTER — Ambulatory Visit: Payer: Managed Care, Other (non HMO) | Admitting: Family Medicine

## 2021-09-13 ENCOUNTER — Telehealth: Payer: Self-pay | Admitting: Urology

## 2021-09-13 VITALS — BP 132/88 | HR 66 | Temp 97.6°F | Ht 63.75 in | Wt 221.0 lb

## 2021-09-13 DIAGNOSIS — R8290 Unspecified abnormal findings in urine: Secondary | ICD-10-CM | POA: Diagnosis not present

## 2021-09-13 DIAGNOSIS — R1310 Dysphagia, unspecified: Secondary | ICD-10-CM | POA: Insufficient documentation

## 2021-09-13 DIAGNOSIS — E1165 Type 2 diabetes mellitus with hyperglycemia: Secondary | ICD-10-CM

## 2021-09-13 DIAGNOSIS — E039 Hypothyroidism, unspecified: Secondary | ICD-10-CM

## 2021-09-13 DIAGNOSIS — M542 Cervicalgia: Secondary | ICD-10-CM | POA: Insufficient documentation

## 2021-09-13 DIAGNOSIS — I10 Essential (primary) hypertension: Secondary | ICD-10-CM | POA: Diagnosis not present

## 2021-09-13 DIAGNOSIS — R1313 Dysphagia, pharyngeal phase: Secondary | ICD-10-CM

## 2021-09-13 DIAGNOSIS — Z794 Long term (current) use of insulin: Secondary | ICD-10-CM

## 2021-09-13 LAB — POC URINALSYSI DIPSTICK (AUTOMATED)
Bilirubin, UA: NEGATIVE
Blood, UA: 25
Glucose, UA: NEGATIVE
Ketones, UA: NEGATIVE
Nitrite, UA: POSITIVE
Protein, UA: POSITIVE — AB
Spec Grav, UA: 1.015 (ref 1.010–1.025)
Urobilinogen, UA: 0.2 E.U./dL
pH, UA: 6 (ref 5.0–8.0)

## 2021-09-13 LAB — POCT GLYCOSYLATED HEMOGLOBIN (HGB A1C): Hemoglobin A1C: 9.8 % — AB (ref 4.0–5.6)

## 2021-09-13 LAB — T4, FREE: Free T4: 0.96 ng/dL (ref 0.60–1.60)

## 2021-09-13 LAB — TSH: TSH: 2.53 u[IU]/mL (ref 0.35–5.50)

## 2021-09-13 NOTE — Assessment & Plan Note (Signed)
bp in fair control at this time  BP Readings from Last 1 Encounters:  09/13/21 132/88   No changes needed Most recent labs reviewed  Disc lifstyle change with low sodium diet and exercise  Plan to continue losartan hct 100-25 mg daily  Metoprolol xl 50 mg daily

## 2021-09-13 NOTE — Progress Notes (Signed)
Subjective:    Patient ID: Diana Deleon, female    DOB: 1961/08/09, 60 y.o.   MRN: [id number]  This visit occurred during the SARS-CoV-2 public health emergency.  Safety protocols were in place, including screening questions prior to the visit, additional usage of staff PPE, and extensive cleaning of exam room while observing appropriate contact time as indicated for disinfecting solutions.   HPI Pt presents for f/u of DM2 and other chronic medical problems Also thinks she has uti   Wt Readings from Last 3 Encounters:  09/13/21 221 lb (100.2 kg)  07/19/21 219 lb 3.2 oz (99.4 kg)  06/12/21 220 lb 9 oz (100 kg)   38.23 kg/m No appetite Never wants to eat     Urine smells bad  No urgency  Urinating more often/drinking fluids   Has been doing ok  Planning some foot surgery  Arthritis in R big toe - with a bone spur, very painful  Has knee replacement planned in January (right knee)   HTN bp is stable today  No cp or palpitations or headaches or edema  No side effects to medicines  bp is stable today  No cp or palpitations or headaches or edema  No side effects to medicines  BP Readings from Last 3 Encounters:  09/13/21 132/88  07/19/21 (!) 145/79  06/12/21 (!) 146/90      Takes losartan 100-25 mg daily  Metoprolol xl 50 mg daily   DM2 Lab Results  Component Value Date   HGBA1C 7.6 (H) 05/10/2021   9.8 today  This was down from 7.8 the time prior  Noted diet was as good as it can be  Adv to check glucose bid  Glipizide xl 10 mg daily  Levemir 65 u daily  Lab Results  Component Value Date   HGBA1C 9.8 (A) 09/13/2021   She has been thirsty   Thinks she has another uti also    She did not check her blood sugars as planned  Fasting -no more lows , usually 120s 130s  Post prandial-forgets to check  Diet has been good overall / has a friend who did the opta via program  She bought her food and starts plan on Monday  Is a picky eater   Not a lot of  exercise due to foot and knee  Could do an upper body work out   Taking arb and statin  Eye exam  9/20  Foot care -planning surgery   Hypothyroidism  Pt has no clinical changes No change in energy level/ hair or skin/ edema and no tremor Lab Results  Component Value Date   TSH 4.81 (H) 05/10/2021    Levothyroxine 50 mcg daily   Has a little neck pain on L -fleeting  Hard time swallowing  In the upper throat- sometimes makes her cough No lump in throat   Ua today-some leuk Results for orders placed or performed in visit on 09/13/21  POCT glycosylated hemoglobin (Hb A1C)  Result Value Ref Range   Hemoglobin A1C 9.8 (A) 4.0 - 5.6 %   HbA1c POC (<> result, manual entry)     HbA1c, POC (prediabetic range)     HbA1c, POC (controlled diabetic range)    POCT Urinalysis Dipstick (Automated)  Result Value Ref Range   Color, UA Yellow    Clarity, UA Cloudy    Glucose, UA Negative Negative   Bilirubin, UA Negative    Ketones, UA Negative    Spec Grav, UA 1.015  1.010 - 1.025   Blood, UA 25 Ery/uL    pH, UA 6.0 5.0 - 8.0   Protein, UA Positive (A) Negative   Urobilinogen, UA 0.2 0.2 or 1.0 E.U./dL   Nitrite, UA Positive    Leukocytes, UA Moderate (2+) (A) Negative   Culture ordered Review of Systems  Constitutional:  Positive for fatigue. Negative for activity change, appetite change, fever and unexpected weight change.  HENT:  Positive for trouble swallowing. Negative for congestion, ear pain, rhinorrhea, sinus pressure and sore throat.   Eyes:  Negative for pain, redness and visual disturbance.  Respiratory:  Negative for cough, shortness of breath and wheezing.   Cardiovascular:  Negative for chest pain and palpitations.  Gastrointestinal:  Negative for abdominal pain, blood in stool, constipation and diarrhea.  Endocrine: Negative for polydipsia and polyuria.  Genitourinary:  Negative for dysuria, frequency and urgency.  Musculoskeletal:  Positive for neck pain. Negative  for arthralgias, back pain and myalgias.  Skin:  Negative for pallor and rash.  Allergic/Immunologic: Negative for environmental allergies.  Neurological:  Negative for dizziness, syncope and headaches.  Hematological:  Negative for adenopathy. Does not bruise/bleed easily.  Psychiatric/Behavioral:  Negative for decreased concentration and dysphoric mood. The patient is not nervous/anxious.       Objective:   Physical Exam Constitutional:      General: She is not in acute distress.    Appearance: Normal appearance. She is well-developed. She is obese. She is not ill-appearing or diaphoretic.  HENT:     Head: Normocephalic and atraumatic.     Nose: Nose normal.     Mouth/Throat:     Mouth: Mucous membranes are moist.     Pharynx: Oropharynx is clear. No oropharyngeal exudate or posterior oropharyngeal erythema.  Eyes:     General: No scleral icterus.    Conjunctiva/sclera: Conjunctivae normal.     Pupils: Pupils are equal, round, and reactive to light.  Neck:     Thyroid: No thyromegaly.     Vascular: No carotid bruit or JVD.     Comments: Mild fullness in L thyroid area  Not tender  Nl rom    Cardiovascular:     Rate and Rhythm: Normal rate and regular rhythm.     Heart sounds: Normal heart sounds.    No gallop.  Pulmonary:     Effort: Pulmonary effort is normal. No respiratory distress.     Breath sounds: Normal breath sounds. No stridor. No wheezing, rhonchi or rales.  Abdominal:     General: Bowel sounds are normal. There is no distension or abdominal bruit.     Palpations: Abdomen is soft. There is no mass.     Tenderness: There is no abdominal tenderness.  Musculoskeletal:     Cervical back: Normal range of motion and neck supple. No rigidity or tenderness.     Right lower leg: No edema.     Left lower leg: No edema.  Lymphadenopathy:     Cervical: No cervical adenopathy.  Skin:    General: Skin is warm and dry.     Coloration: Skin is not jaundiced or pale.      Findings: No bruising, erythema, lesion or rash.  Neurological:     Mental Status: She is alert.     Coordination: Coordination normal.     Deep Tendon Reflexes: Reflexes are normal and symmetric. Reflexes normal.  Psychiatric:        Mood and Affect: Mood normal.  Assessment & Plan:   Problem List Items Addressed This Visit       Cardiovascular and Mediastinum   Essential hypertension    bp in fair control at this time  BP Readings from Last 1 Encounters:  09/13/21 132/88  No changes needed Most recent labs reviewed  Disc lifstyle change with low sodium diet and exercise  Plan to continue losartan hct 100-25 mg daily  Metoprolol xl 50 mg daily          Digestive   Dysphagia   Relevant Orders   US THYROID     Endocrine   Hypothyroidism    TSH and FT4 today  Some neck fullness/discomfort with issues swallowing Thyroid US ordered       Relevant Orders   TSH   T4, free   US THYROID   Type 2 diabetes mellitus with hyperglycemia ([place]) - Primary    Lab Results  Component Value Date   HGBA1C 9.8 (A) 09/13/2021  Way up  Not checking glucose readings pp Asked her to do this and will review in 2 wk  Starting opta via diet on Monday  Disc upper body exercise  Enc to schedule eye exam  For now continue Glipizide xl 10 mg daily  levemir 65 u daily  Will check on coverage of GLP-1 med before f/u  She has low appetite already however      Relevant Orders   POCT glycosylated hemoglobin (Hb A1C) (Completed)     Other   Abnormal urine odor    ua with leukocytes cx ordered  Glucose is high       Relevant Orders   POCT Urinalysis Dipstick (Automated) (Completed)   Urine Culture   Neck pain    Anterior/left slt fullness of L thyroid area  Some dysphagia US thyroid ordered      Relevant Orders   US THYROID   Other Visit Diagnoses     Abnormal urinalysis       Relevant Orders   Urine Culture

## 2021-09-13 NOTE — Assessment & Plan Note (Signed)
Anterior/left slt fullness of L thyroid area  Some dysphagia US thyroid ordered

## 2021-09-13 NOTE — Assessment & Plan Note (Signed)
ua with leukocytes cx ordered  Glucose is high

## 2021-09-13 NOTE — Assessment & Plan Note (Signed)
Lab Results  Component Value Date   HGBA1C 9.8 (A) 09/13/2021   Way up  Not checking glucose readings pp Asked her to do this and will review in 2 wk  Starting opta via diet on Monday  Disc upper body exercise  Enc to schedule eye exam  For now continue Glipizide xl 10 mg daily  levemir 65 u daily  Will check on coverage of GLP-1 med before f/u  She has low appetite already however

## 2021-09-13 NOTE — Patient Instructions (Addendum)
Check blood sugar twice daily for 2 weeks  Am and then 2 hours after eating (different times) Try upper body work out   Check in with your insurance company to see if [place] (or any medicine in that class) is covered  Let me know when you get me the blood sugars   Follow up in 1 month  Labs for thyroid today  I plan to order a thyroid ultrasound You will get a call

## 2021-09-13 NOTE — Telephone Encounter (Signed)
DOS - 10/05/21  KELLER BUNION IMPLANT RIGHT --- [place] EFFECTIVE DATE - 11/17/2019  PER [employer]'S AUTOMATIVE SYSTEM FOR  CPT CODE 02774 NO PRIOR AUTH IS REQUIRED.  REF # [id number]

## 2021-09-13 NOTE — Assessment & Plan Note (Signed)
TSH and FT4 today  Some neck fullness/discomfort with issues swallowing Thyroid US ordered

## 2021-09-15 ENCOUNTER — Telehealth: Payer: Self-pay | Admitting: Family Medicine

## 2021-09-15 ENCOUNTER — Encounter: Payer: Self-pay | Admitting: Family Medicine

## 2021-09-15 LAB — URINE CULTURE
MICRO NUMBER:: 12434464
SPECIMEN QUALITY:: ADEQUATE

## 2021-09-15 MED ORDER — CEPHALEXIN 500 MG PO CAPS: 500.0000 mg | ORAL_CAPSULE | Freq: Two times a day (BID) | ORAL | 0 refills | Status: DC

## 2021-09-15 NOTE — Telephone Encounter (Signed)
Keflex sent for uti  Message to pt sent by my chart

## 2021-09-21 ENCOUNTER — Encounter: Payer: Self-pay | Admitting: Family Medicine

## 2021-09-22 MED ORDER — FLUCONAZOLE 150 MG PO TABS: 150.0000 mg | ORAL_TABLET | Freq: Once | ORAL | 0 refills | Status: AC

## 2021-10-01 ENCOUNTER — Other Ambulatory Visit: Payer: Self-pay | Admitting: Family Medicine

## 2021-10-02 ENCOUNTER — Encounter: Payer: Self-pay | Admitting: Family Medicine

## 2021-10-02 ENCOUNTER — Other Ambulatory Visit: Payer: Self-pay | Admitting: Family Medicine

## 2021-10-02 MED ORDER — SULFAMETHOXAZOLE-TRIMETHOPRIM 800-160 MG PO TABS: 1.0000 | ORAL_TABLET | Freq: Two times a day (BID) | ORAL | 0 refills | Status: DC

## 2021-10-02 MED ORDER — OZEMPIC (0.25 OR 0.5 MG/DOSE) 2 MG/1.5ML ~~LOC~~ SOPN: 0.5000 mg | PEN_INJECTOR | SUBCUTANEOUS | 1 refills | Status: DC

## 2021-10-02 NOTE — Telephone Encounter (Signed)
Copy of log placed in your inbox for review

## 2021-10-03 ENCOUNTER — Telehealth: Payer: Self-pay

## 2021-10-03 NOTE — Telephone Encounter (Signed)
Left message for patient to call back  

## 2021-10-03 NOTE — Telephone Encounter (Signed)
See pharmacy's note on Rx it says: Pharmacy comment: Alternative Requested:THE PRESCRIBED MEDICATION IS NOT COVERED BY INSURANCE. PLEASE CONSIDER CHANGING TO ONE OF THE SUGGESTED COVERED ALTERNATIVES OR SUBMITTING FOR A PRIOR AUTHORIZATION.   All Pharmacy Suggested Alternatives:   Exenatide ER (BYDUREON) 2 MG PEN Exenatide ER (BYDUREON BCISE) 2 MG/0.85ML AUIJ Dulaglutide (TRULICITY) 1.84 CR/7.5OH SOPN metFORMIN (GLUCOPHAGE) 500 MG tablet  *do you want to change it to a covered med

## 2021-10-03 NOTE — Telephone Encounter (Signed)
Please let her know that the semaglutide is not covered so I would like to replace it with the generic of trulicity  (I prefer it to the exanatide also)  Let me know if that is ok  It should be the same  I cannot seem to get rid of the exenatide -can you do that  Return to me and I will send in trulicity to inj 0.25 weekly

## 2021-10-03 NOTE — Telephone Encounter (Signed)
Diana Deleon called to cancel her surgery with Dr. Amalia Hailey on 10/05/2021. She stated her A1c is up to 9.4 and she is battling a UTI. She stated she will call back to reschedule when her A1c is down. Notified Dr. Amalia Hailey and Caren Griffins with [place]

## 2021-10-11 ENCOUNTER — Ambulatory Visit: Payer: Managed Care, Other (non HMO) | Admitting: Family Medicine

## 2021-10-11 ENCOUNTER — Encounter: Payer: Self-pay | Admitting: Family Medicine

## 2021-10-11 NOTE — Progress Notes (Deleted)
   Subjective:    Patient ID: Diana Deleon, female    DOB: 03-12-1961, 60 y.o.   MRN: [id number]  This visit occurred during the SARS-CoV-2 public health emergency.  Safety protocols were in place, including screening questions prior to the visit, additional usage of staff PPE, and extensive cleaning of exam room while observing appropriate contact time as indicated for disinfecting solutions.   HPI Pt presents for f/u of DM2    Last visit a1c was up  Lab Results  Component Value Date   HGBA1C 9.8 (A) 09/13/2021   She was not checking her pp blood glucose   Discussed semaglutide 0.25 mg weekly   Treated for uti with bactrim and keflex   Lab Results  Component Value Date   CREATININE 1.01 05/10/2021   BUN 16 05/10/2021   NA 140 05/10/2021   K 3.5 05/10/2021   CL 99 05/10/2021   CO2 33 (H) 05/10/2021      Review of Systems     Objective:   Physical Exam        Assessment & Plan:

## 2021-10-13 ENCOUNTER — Encounter: Payer: Managed Care, Other (non HMO) | Admitting: Podiatry

## 2021-10-16 ENCOUNTER — Other Ambulatory Visit: Payer: Self-pay

## 2021-10-16 ENCOUNTER — Ambulatory Visit
Admission: RE | Admit: 2021-10-16 | Discharge: 2021-10-16 | Disposition: A | Discharge status: Home / Self Care | Payer: Managed Care, Other (non HMO) | Source: Ambulatory Visit | Attending: Family Medicine | Admitting: Family Medicine

## 2021-10-16 DIAGNOSIS — R1313 Dysphagia, pharyngeal phase: Secondary | ICD-10-CM | POA: Diagnosis present

## 2021-10-16 DIAGNOSIS — E039 Hypothyroidism, unspecified: Secondary | ICD-10-CM | POA: Insufficient documentation

## 2021-10-16 DIAGNOSIS — M542 Cervicalgia: Secondary | ICD-10-CM | POA: Diagnosis present

## 2021-10-20 ENCOUNTER — Encounter: Payer: Managed Care, Other (non HMO) | Admitting: Podiatry

## 2021-10-24 NOTE — Telephone Encounter (Signed)
Tried calling the patient and her spouses mobile number she did not respond did leave a VM for her to call back.

## 2021-10-27 ENCOUNTER — Telehealth: Payer: Self-pay | Admitting: Family Medicine

## 2021-10-27 ENCOUNTER — Encounter: Payer: Self-pay | Admitting: Family Medicine

## 2021-10-27 ENCOUNTER — Ambulatory Visit: Payer: Managed Care, Other (non HMO) | Admitting: Family Medicine

## 2021-10-27 ENCOUNTER — Other Ambulatory Visit: Payer: Self-pay

## 2021-10-27 ENCOUNTER — Other Ambulatory Visit: Payer: Self-pay | Admitting: Family Medicine

## 2021-10-27 DIAGNOSIS — Z794 Long term (current) use of insulin: Secondary | ICD-10-CM

## 2021-10-27 DIAGNOSIS — E1165 Type 2 diabetes mellitus with hyperglycemia: Secondary | ICD-10-CM | POA: Diagnosis not present

## 2021-10-27 DIAGNOSIS — M542 Cervicalgia: Secondary | ICD-10-CM

## 2021-10-27 MED ORDER — TRULICITY 0.75 MG/0.5ML ~~LOC~~ SOAJ: 0.7500 mg | SUBCUTANEOUS | 3 refills | Status: DC

## 2021-10-27 NOTE — Telephone Encounter (Signed)
Patient has called the office in regards to a problem with picking up her medication. About a month ago she was prescribed OZEMPIC which is covered under her insurance as long as there has been a PA completed. Pt was told by the nurse that ozempic is not covered; decided to switch to TRULICITY today. When going to the pharmacy to pick it up today, they stated trulicity is not covered and they could not fill the prescription.  Patient double checked and OZEMPIC is covered as long as the PA is in so she is wondering if she can try that or just have something else.   Please advise.

## 2021-10-27 NOTE — Progress Notes (Signed)
Subjective:    Patient ID: Diana Deleon, female    DOB: 1960-12-26, 60 y.o.   MRN: [id number]  This visit occurred during the SARS-CoV-2 public health emergency.  Safety protocols were in place, including screening questions prior to the visit, additional usage of staff PPE, and extensive cleaning of exam room while observing appropriate contact time as indicated for disinfecting solutions.   HPI Pt presents for f/u of DM2  Wt Readings from Last 3 Encounters:  10/27/21 219 lb 6 oz (99.5 kg)  09/13/21 221 lb (100.2 kg)  07/19/21 219 lb 3.2 oz (99.4 kg)   37.95 kg/m  Going about the same overall    Last visit a1c was up  Lab Results  Component Value Date   HGBA1C 9.8 (A) 09/13/2021   Am 127 today , 102-135  Then he eats it goes up quite a bit  After eating in the mid 200s   Struggles with eating  Tried the [employer]avia - food is gritty  She does not tolerate it  Has a bad gag reflex   Lots of protein from lean meat  Chicken primarily   Some salad    Dislikes fruit/veg   She is trying to follow it without buying their foods  Eating more often  Working on that   Exercise- as much as she can  Knees bother her  Has an exercise bike -still hard to use  Having knee done in January    Asked to return with glucose readings  Eye care   Glipizide xl 10 mg daily  Levemir 65 u daily    Had reassuring thyroid US IMPRESSION: Unremarkable thyroid ultrasound. 0.6 cm benign-appearing spongiform nodule in the right lobe does not meet criteria for biopsy or dedicated follow-up.   The above is in keeping with the ACR TI-RADS recommendations - J Am Coll Radiol 2017;14:587-595.  Lab Results  Component Value Date   TSH 2.53 09/13/2021    Often feels like there is something in her throat  Tonsil stones ?  Not painful  Affects breath    Took keflex for a uti , then tx for yeast infection   Lab Results  Component Value Date   CREATININE 1.01 05/10/2021   BUN 16  05/10/2021   NA 140 05/10/2021   K 3.5 05/10/2021   CL 99 05/10/2021   CO2 33 (H) 05/10/2021    Patient Active Problem List   Diagnosis Date Noted   Abnormal urine odor 09/13/2021   Neck pain 09/13/2021   Dysphagia 09/13/2021   Blister of foot 06/12/2021   Current use of proton pump inhibitor 05/09/2021   Foot pain, bilateral 11/03/2020   S/P TKR (total knee replacement) using cement, left 06/21/2020   Osteoarthritis 04/28/2020   Diabetes mellitus ([place]) 03/01/2020   Hyperlipidemia associated with type 2 diabetes mellitus ([place]) 11/02/2018   Low back pain 03/21/2018   Osteopenia 01/05/2018   Aromatase inhibitor use 12/25/2017   Arthralgia 07/03/2017   Chronic pain of left knee 07/03/2017   Class 2 severe obesity due to excess calories with serious comorbidity and body mass index (BMI) of 37.0 to 37.9 in adult [facility]) 07/03/2017   Trigger ring finger of right hand 07/03/2017   Psoriasis 11/02/2016   Colon cancer screening 11/02/2016   Class 2 severe obesity due to excess calories with serious comorbidity and body mass index (BMI) of 36.0 to 36.9 in adult [facility]) 10/31/2015   History of breast cancer 10/25/2014   Type 2 diabetes  mellitus with hyperglycemia ([place]) 07/14/2013   Vaginal intraepithelial neoplasia grade 1 07/09/2013   Routine general medical examination at a health care facility 10/12/2012   Hypothyroidism 06/03/2012   Overactive bladder 05/27/2012   HSV infection 07/07/2010   URINARY URGENCY 07/15/2009   LOW BACK PAIN, CHRONIC 05/17/2008   HEADACHE, CHRONIC 05/17/2008   Essential hypertension 02/12/2008   Past Medical History:  Diagnosis Date   Anemia    "off and on"   Arthritis    "right toes" (03/24/2015)   Cancer of right breast ([place]) 2015   Cyst of cystic duct    chest- central location, taking Cephalexin currently   Diabetes mellitus without complication ([place])    Fibroids    hyst. 2006   GERD (gastroesophageal reflux disease)    History of abnormal Pap  smear    has had leep in past   History of cold sores    has used valtrex in past   History of recurrent UTIs    Hypertension    Hypothyroidism    Intermittent vertigo    Seborrheic dermatitis    local on R scalp and in ear (failed mult meds- uses steroid spray)   Thyroid disease    Past Surgical History:  Procedure Laterality Date   BREAST RECONSTRUCTION WITH PLACEMENT OF TISSUE EXPANDER AND FLEX HD (ACELLULAR HYDRATED DERMIS) Right 11/25/2014   Procedure: RIGHT BREAST RECONSTRUCTION WITH PLACEMENT OF TISSUE EXPANDER AND USE OF ACELLULAR DERMRAL MATRIX ;  Surgeon: Crissie Reese, MD;  Location: [place];  Service: Plastics;  Laterality: Right;   BREAST REDUCTION SURGERY Left 03/24/2015   Procedure: LEFT BREAST REDUCTION  (BREAST);  Surgeon: Crissie Reese, MD;  Location: [place];  Service: Plastics;  Laterality: Left;   BREAST SURGERY Right    mastectomy  lymph node removal   COLONOSCOPY     COLONOSCOPY WITH PROPOFOL N/A 12/28/2016   Procedure: COLONOSCOPY WITH PROPOFOL;  Surgeon: Jonathon Bellows, MD;  Location: [facility];  Service: Endoscopy;  Laterality: N/A;   LAPAROSCOPIC CHOLECYSTECTOMY  2003   LITHOTRIPSY  ~ 2007   REMOVAL OF TISSUE EXPANDER Right 03/24/2015   REMOVAL OF TISSUE EXPANDER AND PLACEMENT OF IMPLANT Right 03/24/2015   w/delayed construction   REMOVAL OF TISSUE EXPANDER AND PLACEMENT OF IMPLANT Right 03/24/2015   Procedure: REMOVAL OF RIGHT BREAST TISSUE EXPANDER AND DELAYED BREAST RECONSTRUCTION WITH PLACEMENT OF IMPLANT;  Surgeon: Crissie Reese, MD;  Location: [place];  Service: Plastics;  Laterality: Right;   SIMPLE MASTECTOMY WITH AXILLARY SENTINEL NODE BIOPSY Right 11/25/2014   Procedure: RIGHT TOTAL  MASTECTOMY WITH AXILLARY SENTINEL NODE BIOPSY ;  Surgeon: Excell Seltzer, MD;  Location: [place];  Service: General;  Laterality: Right;   TOTAL KNEE ARTHROPLASTY Left 06/21/2020   Procedure: LEFT TOTAL KNEE ARTHROPLASTY;  Surgeon: Hessie Knows, MD;  Location: [facility];  Service:  Orthopedics;  Laterality: Left;   TUBAL LIGATION     VAGINAL DELIVERY  x3   VAGINAL HYSTERECTOMY  2006   "fibroids"   Social History   Tobacco Use   Smoking status: Never   Smokeless tobacco: Never  Vaping Use   Vaping Use: Never used  Substance Use Topics   Alcohol use: No    Alcohol/week: 0.0 standard drinks   Drug use: No   Family History  Problem Relation Age of Onset   Alcohol abuse Father    COPD Father    Hypertension Father    Hypertension Mother    Parkinson's disease Mother  Allergies  Allergen Reactions   Metformin And Related Diarrhea   Norvasc [Amlodipine Besylate] Swelling   Current Outpatient Medications on File Prior to Visit  Medication Sig Dispense Refill   alendronate (FOSAMAX) 70 MG tablet TAKE 1 TABLET BY MOUTH EVERY 7 DAYS. TAKE WITH A FULL GLASS OF WATER ON AN EMPTY STOMACH. 12 tablet 1   aspirin EC 81 MG tablet Take 81 mg by mouth daily.     augmented betamethasone dipropionate (DIPROLENE-AF) 0.05 % ointment Apply 1 application topically daily.      Calcium Carb-Cholecalciferol 600-800 MG-UNIT TABS Take 2 tablets by mouth daily.     cephALEXin (KEFLEX) 500 MG capsule Take 1 capsule (500 mg total) by mouth 2 (two) times daily. 14 capsule 0   Clobetasol Propionate 0.05 % shampoo Apply 1 application topically once a week.     glipiZIDE (GLUCOTROL XL) 10 MG 24 hr tablet Take 1 tablet (10 mg total) by mouth daily with breakfast. 90 tablet 3   insulin detemir (LEVEMIR FLEXTOUCH) 100 UNIT/ML FlexPen INJECT 65 UNITS INTO SKIN AT BEDTIME 15 mL 5   ketoconazole (NIZORAL) 2 % shampoo Apply 1 application topically 2 (two) times a week.     levothyroxine (SYNTHROID) 50 MCG tablet TAKE 1 TABLET(50 MCG) BY MOUTH DAILY 90 tablet 0   losartan-hydrochlorothiazide (HYZAAR) 100-25 MG tablet Take 1 tablet by mouth daily. 90 tablet 3   meloxicam (MOBIC) 7.5 MG tablet Take 7.5 mg by mouth daily.     metoprolol succinate (TOPROL-XL) 50 MG 24 hr tablet Take 1 tablet (50  mg total) by mouth daily. Take with or immediately following a meal. 90 tablet 3   omeprazole (PRILOSEC) 20 MG capsule TAKE 1 CAPSULE BY MOUTH DAILY BEFORE BREAKFAST 90 capsule 3   ONETOUCH DELICA LANCETS FINE MISC Use to check blood sugar once daily and as needed (dx. E11.9) 100 each 1   ONETOUCH VERIO test strip USE TO CHECK BLOOD SUGAR ONCE DAILY AND AS NEEDED 100 each 2   Risankizumab-rzaa (SKYRIZI, 150 MG DOSE, [place]) Inject into the skin. Take as directed (every 3 month)     rosuvastatin (CRESTOR) 5 MG tablet Take 1 tablet (5 mg total) by mouth daily. 90 tablet 3   valACYclovir (VALTREX) 500 MG tablet TAKE 1 TABLET(500 MG) BY MOUTH TWICE DAILY FOR 5 DAYS FOR OUTBREAK 10 tablet 1   venlafaxine XR (EFFEXOR-XR) 75 MG 24 hr capsule TAKE 1 CAPSULE(75 MG) BY MOUTH DAILY WITH BREAKFAST 90 capsule 3   Dulaglutide (TRULICITY) 3.89 HT/3.4KA SOPN Inject 0.75 mg into the skin once a week. 5 mL 3   No current facility-administered medications on file prior to visit.     Review of Systems  Constitutional:  Positive for fatigue. Negative for activity change, appetite change, fever and unexpected weight change.  HENT:  Negative for congestion, ear pain, rhinorrhea, sinus pressure and sore throat.   Eyes:  Negative for pain, redness and visual disturbance.  Respiratory:  Negative for cough, shortness of breath and wheezing.   Cardiovascular:  Negative for chest pain and palpitations.  Gastrointestinal:  Negative for abdominal pain, blood in stool, constipation and diarrhea.  Endocrine: Negative for polydipsia and polyuria.  Genitourinary:  Negative for dysuria, frequency and urgency.  Musculoskeletal:  Negative for arthralgias, back pain and myalgias.  Skin:  Negative for pallor and rash.  Allergic/Immunologic: Negative for environmental allergies.  Neurological:  Negative for dizziness, syncope and headaches.  Hematological:  Negative for adenopathy. Does not bruise/bleed easily.  Psychiatric/Behavioral:  Negative for decreased concentration and dysphoric mood. The patient is not nervous/anxious.       Objective:   Physical Exam Constitutional:      General: She is not in acute distress.    Appearance: Normal appearance. She is obese. She is not ill-appearing or diaphoretic.  HENT:     Head: Normocephalic and atraumatic.     Mouth/Throat:     Mouth: Mucous membranes are moist.     Pharynx: Oropharynx is clear.     Comments: No throat swelling or tonsil stones today Neck:     Vascular: No carotid bruit.  Cardiovascular:     Rate and Rhythm: Normal rate and regular rhythm.     Heart sounds: Normal heart sounds.  Pulmonary:     Effort: Pulmonary effort is normal. No respiratory distress.     Breath sounds: Normal breath sounds. No wheezing or rales.  Musculoskeletal:     Cervical back: Normal range of motion and neck supple. No rigidity or tenderness.     Right lower leg: No edema.     Left lower leg: No edema.  Neurological:     Mental Status: She is alert.          Assessment & Plan:   Problem List Items Addressed This Visit       Endocrine   Type 2 diabetes mellitus with hyperglycemia ([place])    Lab Results  Component Value Date   HGBA1C 9.8 (A) 09/13/2021  Pt struggles with eating because she dislikes most healthy foods/gags with certain textures  Would like to ref for one on one nutrition counseling but she needs to see if that is covered Taking  gluopizide xl 10 mg daily  levemir 65 u daily  inst to look into water exercise  Will inc levemir to 70 u for now  Interested in semaglutide or other GLP med  Will ask pharmacy for prior auth for that  Plan f/u 3 months  Plans to schedule her eye exam           Other   Neck pain    Reassuring thyroid US with small nodule  Pt no longer has pain, but struggles with tonsil stones Disc use of gargle  Consider ENT pref if needed later

## 2021-10-27 NOTE — Telephone Encounter (Signed)
Pt notified of Dr. Marliss Coots comments and instructions and verbalized understanding. She will update [place] in a few weeks on how she's doing

## 2021-10-27 NOTE — Telephone Encounter (Signed)
Got it! Sorry about that  Her pharmacy prefers trulicity , so will start with that  I sent it   Inject once weekly 0.75  Let me know if there is anything extra she needs or if she needs teaching to inject   Let me know if side eff or problems  Get me message about glucose readings in 2-4 weeks and we can inc dose at 4 wk if needed

## 2021-10-27 NOTE — Patient Instructions (Addendum)
Think about the [employer] for water exercise   Call the pharmacy and have them send me a prior auth for the semaglutide  If you don't hear from [place] after that please email me   If your insurance would cover a one on one nutritional consult let me  know and I can do a referral   Get your eye exam and get [place] a copy when you can   Try going up to 70 on your semaglutide   Every pound you loose makes a difference   Plan to follow up in early January

## 2021-10-27 NOTE — Telephone Encounter (Signed)
Please call the pharmacy to have them re activate the prior ozempic and send me a prior auth  For the record, I sent trulicity because they told me ozempic was not covered   Thanks

## 2021-10-29 NOTE — Assessment & Plan Note (Signed)
Reassuring thyroid US with small nodule  Pt no longer has pain, but struggles with tonsil stones Disc use of gargle  Consider ENT pref if needed later

## 2021-10-29 NOTE — Assessment & Plan Note (Signed)
Lab Results  Component Value Date   HGBA1C 9.8 (A) 09/13/2021   Pt struggles with eating because she dislikes most healthy foods/gags with certain textures  Would like to ref for one on one nutrition counseling but she needs to see if that is covered Taking  gluopizide xl 10 mg daily  levemir 65 u daily  inst to look into water exercise  Will inc levemir to 70 u for now  Interested in semaglutide or other GLP med  Will ask pharmacy for prior auth for that  Plan f/u 3 months  Plans to schedule her eye exam

## 2021-10-30 ENCOUNTER — Other Ambulatory Visit: Payer: Self-pay | Admitting: Family Medicine

## 2021-10-30 NOTE — Telephone Encounter (Signed)
Left VM with pharmacy asking them to send in PA for ozempic that was sent in on 10/02/21

## 2021-11-01 ENCOUNTER — Encounter: Payer: Self-pay | Admitting: Family Medicine

## 2021-11-01 NOTE — Telephone Encounter (Signed)
PA received at completed at [email or web address], I will await a response

## 2021-11-02 ENCOUNTER — Other Ambulatory Visit: Payer: Self-pay

## 2021-11-02 MED ORDER — ONETOUCH VERIO VI STRP: ORAL_STRIP | 2 refills | Status: AC

## 2021-11-02 NOTE — Telephone Encounter (Signed)
sent 

## 2021-11-03 ENCOUNTER — Encounter: Payer: Managed Care, Other (non HMO) | Admitting: Podiatry

## 2021-11-08 NOTE — Telephone Encounter (Signed)
PA was approved but it was for the Trulicity not Ozempic but insurance did approve the Trulicity. Left VM letting pharmacy   FYI to PCP

## 2021-11-08 NOTE — Telephone Encounter (Signed)
Thanks for the heads up. I think the px is in- let me know if any problems

## 2021-12-01 ENCOUNTER — Other Ambulatory Visit: Payer: Self-pay | Admitting: Orthopedic Surgery

## 2021-12-01 DIAGNOSIS — M1711 Unilateral primary osteoarthritis, right knee: Secondary | ICD-10-CM

## 2021-12-06 DIAGNOSIS — H35033 Hypertensive retinopathy, bilateral: Secondary | ICD-10-CM | POA: Diagnosis not present

## 2021-12-20 ENCOUNTER — Encounter: Payer: Self-pay | Admitting: Family Medicine

## 2021-12-22 ENCOUNTER — Ambulatory Visit: Payer: BC Managed Care – PPO

## 2021-12-27 ENCOUNTER — Ambulatory Visit: Payer: Self-pay | Admitting: Family Medicine

## 2021-12-27 ENCOUNTER — Ambulatory Visit: Payer: Managed Care, Other (non HMO) | Admitting: Family Medicine

## 2021-12-28 ENCOUNTER — Telehealth (INDEPENDENT_AMBULATORY_CARE_PROVIDER_SITE_OTHER): Payer: BC Managed Care – PPO | Admitting: Family Medicine

## 2021-12-28 ENCOUNTER — Other Ambulatory Visit: Payer: Self-pay

## 2021-12-28 ENCOUNTER — Encounter: Payer: Self-pay | Admitting: Family Medicine

## 2021-12-28 DIAGNOSIS — J069 Acute upper respiratory infection, unspecified: Secondary | ICD-10-CM | POA: Diagnosis not present

## 2021-12-28 MED ORDER — ALBUTEROL SULFATE HFA 108 (90 BASE) MCG/ACT IN AERS: 2.0000 | INHALATION_SPRAY | Freq: Four times a day (QID) | RESPIRATORY_TRACT | 1 refills | Status: DC | PRN

## 2021-12-28 NOTE — Progress Notes (Signed)
Virtual Visit via Video Note  I connected with Kinjal Neitzke on 12/28/21 at  9:30 AM EST by a video enabled telemedicine application and verified that I am speaking with the correct person using two identifiers.  Location: Patient: home Provider: office   I discussed the limitations of evaluation and management by telemedicine and the availability of in person appointments. The patient expressed understanding and agreed to proceed.  Parties involved in encounter  Patient: Diana Deleon  Provider:  Loura Pardon MD   History of Present Illness: Pt presents with cold symptoms since Friday Started with ST and light cough   Dry cough- no phlegm  Nasal congestion when lying down  Some sinus fullness  Some headache  No nasal discharge  No pnd  Throat is sore/not severe  (slept with mouth open)   Some wheezing (when exhaling) No sob at all   No fever  No body aches No chills or sweats  No n/v or diarrhea    Neg home covid test on 1/10  Had telehealth visit through ins- px benzonatate and inst to take benadryl Tessalon helps  [place] shot (every 3 months)    Otc Fluids -water and a little OJ  Tussin for cough (has DM)  Benadryl    Patient Active Problem List   Diagnosis Date Noted   Abnormal urine odor 09/13/2021   Neck pain 09/13/2021   Dysphagia 09/13/2021   Blister of foot 06/12/2021   Current use of proton pump inhibitor 05/09/2021   Foot pain, bilateral 11/03/2020   S/P TKR (total knee replacement) using cement, left 06/21/2020   Osteoarthritis 04/28/2020   Diabetes mellitus ([place]) 03/01/2020   Hyperlipidemia associated with type 2 diabetes mellitus ([place]) 11/02/2018   Low back pain 03/21/2018   Osteopenia 01/05/2018   Aromatase inhibitor use 12/25/2017   Arthralgia 07/03/2017   Chronic pain of left knee 07/03/2017   Class 2 severe obesity due to excess calories with serious comorbidity and body mass index (BMI) of 37.0 to 37.9 in adult [facility]) 07/03/2017    Trigger ring finger of right hand 07/03/2017   Psoriasis 11/02/2016   Colon cancer screening 11/02/2016   Class 2 severe obesity due to excess calories with serious comorbidity and body mass index (BMI) of 36.0 to 36.9 in adult ([place]) 10/31/2015   History of breast cancer 10/25/2014   Viral URI with cough 11/25/2013   Type 2 diabetes mellitus with hyperglycemia ([place]) 07/14/2013   Vaginal intraepithelial neoplasia grade 1 07/09/2013   Routine general medical examination at a health care facility 10/12/2012   Hypothyroidism 06/03/2012   Overactive bladder 05/27/2012   HSV infection 07/07/2010   URINARY URGENCY 07/15/2009   LOW BACK PAIN, CHRONIC 05/17/2008   HEADACHE, CHRONIC 05/17/2008   Essential hypertension 02/12/2008   Past Medical History:  Diagnosis Date   Anemia    "off and on"   Arthritis    "right toes" (03/24/2015)   Cancer of right breast ([place]) 2015   Cyst of cystic duct    chest- central location, taking Cephalexin currently   Diabetes mellitus without complication ([place])    Fibroids    hyst. 2006   GERD (gastroesophageal reflux disease)    History of abnormal Pap smear    has had leep in past   History of cold sores    has used valtrex in past   History of recurrent UTIs    Hypertension    Hypothyroidism    Intermittent vertigo    Seborrheic dermatitis  local on R scalp and in ear (failed mult meds- uses steroid spray)   Thyroid disease    Past Surgical History:  Procedure Laterality Date   BREAST RECONSTRUCTION WITH PLACEMENT OF TISSUE EXPANDER AND FLEX HD (ACELLULAR HYDRATED DERMIS) Right 11/25/2014   Procedure: RIGHT BREAST RECONSTRUCTION WITH PLACEMENT OF TISSUE EXPANDER AND USE OF ACELLULAR DERMRAL MATRIX ;  Surgeon: Crissie Reese, MD;  Location: [place];  Service: Plastics;  Laterality: Right;   BREAST REDUCTION SURGERY Left 03/24/2015   Procedure: LEFT BREAST REDUCTION  (BREAST);  Surgeon: Crissie Reese, MD;  Location: [place];  Service: Plastics;  Laterality:  Left;   BREAST SURGERY Right    mastectomy  lymph node removal   COLONOSCOPY     COLONOSCOPY WITH PROPOFOL N/A 12/28/2016   Procedure: COLONOSCOPY WITH PROPOFOL;  Surgeon: Jonathon Bellows, MD;  Location: [facility];  Service: Endoscopy;  Laterality: N/A;   LAPAROSCOPIC CHOLECYSTECTOMY  2003   LITHOTRIPSY  ~ 2007   REMOVAL OF TISSUE EXPANDER Right 03/24/2015   REMOVAL OF TISSUE EXPANDER AND PLACEMENT OF IMPLANT Right 03/24/2015   w/delayed construction   REMOVAL OF TISSUE EXPANDER AND PLACEMENT OF IMPLANT Right 03/24/2015   Procedure: REMOVAL OF RIGHT BREAST TISSUE EXPANDER AND DELAYED BREAST RECONSTRUCTION WITH PLACEMENT OF IMPLANT;  Surgeon: Crissie Reese, MD;  Location: [place];  Service: Plastics;  Laterality: Right;   SIMPLE MASTECTOMY WITH AXILLARY SENTINEL NODE BIOPSY Right 11/25/2014   Procedure: RIGHT TOTAL  MASTECTOMY WITH AXILLARY SENTINEL NODE BIOPSY ;  Surgeon: Excell Seltzer, MD;  Location: [place];  Service: General;  Laterality: Right;   TOTAL KNEE ARTHROPLASTY Left 06/21/2020   Procedure: LEFT TOTAL KNEE ARTHROPLASTY;  Surgeon: Hessie Knows, MD;  Location: [facility];  Service: Orthopedics;  Laterality: Left;   TUBAL LIGATION     VAGINAL DELIVERY  x3   VAGINAL HYSTERECTOMY  2006   "fibroids"   Social History   Tobacco Use   Smoking status: Never   Smokeless tobacco: Never  Vaping Use   Vaping Use: Never used  Substance Use Topics   Alcohol use: No    Alcohol/week: 0.0 standard drinks   Drug use: No   Family History  Problem Relation Age of Onset   Alcohol abuse Father    COPD Father    Hypertension Father    Hypertension Mother    Parkinson's disease Mother    Allergies  Allergen Reactions   Metformin And Related Diarrhea   Norvasc [Amlodipine Besylate] Swelling   Current Outpatient Medications on File Prior to Visit  Medication Sig Dispense Refill   alendronate (FOSAMAX) 70 MG tablet TAKE 1 TABLET BY MOUTH EVERY 7 DAYS. TAKE WITH A FULL GLASS OF WATER ON AN EMPTY  STOMACH. 12 tablet 1   aspirin EC 81 MG tablet Take 81 mg by mouth daily.     benzonatate (TESSALON) 200 MG capsule Take 200 mg by mouth 3 (three) times daily as needed for cough.     Calcium Carb-Cholecalciferol 600-800 MG-UNIT TABS Take 2 tablets by mouth daily.     Dulaglutide (TRULICITY) 4.25 ZD/6.3OV SOPN Inject 0.75 mg into the skin once a week. 5 mL 3   glipiZIDE (GLUCOTROL XL) 10 MG 24 hr tablet Take 1 tablet (10 mg total) by mouth daily with breakfast. 90 tablet 3   glucose blood (ONETOUCH VERIO) test strip USE TO CHECK BLOOD SUGAR ONCE DAILY AND AS NEEDED 100 each 2   insulin detemir (LEVEMIR FLEXTOUCH) 100 UNIT/ML FlexPen INJECT 65 UNITS INTO  SKIN AT BEDTIME 15 mL 5   levothyroxine (SYNTHROID) 50 MCG tablet TAKE 1 TABLET(50 MCG) BY MOUTH DAILY 90 tablet 0   losartan-hydrochlorothiazide (HYZAAR) 100-25 MG tablet Take 1 tablet by mouth daily. 90 tablet 3   meloxicam (MOBIC) 7.5 MG tablet Take 7.5 mg by mouth daily.     metoprolol succinate (TOPROL-XL) 50 MG 24 hr tablet Take 1 tablet (50 mg total) by mouth daily. Take with or immediately following a meal. 90 tablet 3   omeprazole (PRILOSEC) 20 MG capsule TAKE 1 CAPSULE BY MOUTH DAILY BEFORE BREAKFAST 90 capsule 3   ONETOUCH DELICA LANCETS FINE MISC Use to check blood sugar once daily and as needed (dx. E11.9) 100 each 1   Risankizumab-rzaa (SKYRIZI, 150 MG DOSE, [place]) Inject into the skin. Take as directed (every 3 month)     rosuvastatin (CRESTOR) 5 MG tablet Take 1 tablet (5 mg total) by mouth daily. 90 tablet 3   valACYclovir (VALTREX) 500 MG tablet TAKE 1 TABLET(500 MG) BY MOUTH TWICE DAILY FOR 5 DAYS FOR OUTBREAK 10 tablet 1   venlafaxine XR (EFFEXOR-XR) 75 MG 24 hr capsule TAKE 1 CAPSULE(75 MG) BY MOUTH DAILY WITH BREAKFAST 90 capsule 3   No current facility-administered medications on file prior to visit.   Review of Systems  Constitutional:  Positive for malaise/fatigue. Negative for chills and fever.  HENT:  Positive for  congestion. Negative for ear pain, sinus pain and sore throat.   Eyes:  Negative for blurred vision, discharge and redness.  Respiratory:  Positive for cough and wheezing. Negative for sputum production, shortness of breath and stridor.   Cardiovascular:  Negative for chest pain, palpitations and leg swelling.  Gastrointestinal:  Negative for abdominal pain, diarrhea, nausea and vomiting.  Musculoskeletal:  Negative for myalgias.  Skin:  Negative for rash.  Neurological:  Positive for headaches. Negative for dizziness.    Observations/Objective: Patient appears well, in no distress Weight is baseline  No facial swelling or asymmetry Hoarse voice, sounds congested No obvious tremor or mobility impairment Moving neck and UEs normally Able to hear the call well  No wheeze or shortness of breath during interview  Occ dry hacking cough Talkative and mentally sharp with no cognitive changes No skin changes on face or neck , no rash or pallor Affect is normal    Assessment and Plan: Problem List Items Addressed This Visit       Respiratory   Viral URI with cough    Day 7 , covid neg and no fever or sob  Mild reactive airways (would like to avoid prednisone due to DM), sent albuterol mdi Cough control with tessalon Can use tussin as well( dexamethorphan) Fluids/rest Update if not starting to improve in a week or if worsening  Watch for sob/fever/ facial pain and colored phlegm ER precautions discussed        Follow Up Instructions: Drink fluids and rest  Continue tessalon for cough  Can also use tussin   Try albuterol inhaler as needed for wheeze/cough  If wheezing worsens we will have to consider prednisone- let us know   Update if not starting to improve in a week or if worsening  Watch for facial pain, green nasal discharge   If severe symptoms or short of breath go to the ER   I discussed the assessment and treatment plan with the patient. The patient was provided  an opportunity to ask questions and all were answered. The patient agreed with the plan  and demonstrated an understanding of the instructions.   The patient was advised to call back or seek an in-person evaluation if the symptoms worsen or if the condition fails to improve as anticipated.     Loura Pardon, MD

## 2021-12-28 NOTE — Assessment & Plan Note (Signed)
Day 7 , covid neg and no fever or sob  Mild reactive airways (would like to avoid prednisone due to DM), sent albuterol mdi Cough control with tessalon Can use tussin as well( dexamethorphan) Fluids/rest Update if not starting to improve in a week or if worsening  Watch for sob/fever/ facial pain and colored phlegm ER precautions discussed

## 2021-12-28 NOTE — Patient Instructions (Signed)
Drink fluids and rest  Continue tessalon for cough  Can also use tussin   Try albuterol inhaler as needed for wheeze/cough  If wheezing worsens we will have to consider prednisone- let us know   Update if not starting to improve in a week or if worsening  Watch for facial pain, green nasal discharge   If severe symptoms or short of breath go to the ER

## 2022-01-02 ENCOUNTER — Other Ambulatory Visit: Payer: Self-pay

## 2022-01-02 ENCOUNTER — Ambulatory Visit
Admission: RE | Admit: 2022-01-02 | Discharge: 2022-01-02 | Disposition: A | Discharge status: Home / Self Care | Payer: BC Managed Care – PPO | Source: Ambulatory Visit | Attending: Orthopedic Surgery | Admitting: Orthopedic Surgery

## 2022-01-02 DIAGNOSIS — Z01818 Encounter for other preprocedural examination: Secondary | ICD-10-CM | POA: Diagnosis not present

## 2022-01-02 DIAGNOSIS — M1711 Unilateral primary osteoarthritis, right knee: Secondary | ICD-10-CM | POA: Diagnosis not present

## 2022-01-02 DIAGNOSIS — M25761 Osteophyte, right knee: Secondary | ICD-10-CM | POA: Diagnosis not present

## 2022-01-02 DIAGNOSIS — M25461 Effusion, right knee: Secondary | ICD-10-CM | POA: Diagnosis not present

## 2022-01-03 ENCOUNTER — Other Ambulatory Visit: Payer: Self-pay | Admitting: Family Medicine

## 2022-01-03 ENCOUNTER — Encounter: Payer: Self-pay | Admitting: Family Medicine

## 2022-01-03 ENCOUNTER — Ambulatory Visit (INDEPENDENT_AMBULATORY_CARE_PROVIDER_SITE_OTHER): Payer: BC Managed Care – PPO | Admitting: Family Medicine

## 2022-01-03 VITALS — BP 128/80 | HR 89 | Temp 97.3°F | Ht 63.75 in | Wt 219.0 lb

## 2022-01-03 DIAGNOSIS — E785 Hyperlipidemia, unspecified: Secondary | ICD-10-CM

## 2022-01-03 DIAGNOSIS — E1169 Type 2 diabetes mellitus with other specified complication: Secondary | ICD-10-CM | POA: Diagnosis not present

## 2022-01-03 DIAGNOSIS — E1165 Type 2 diabetes mellitus with hyperglycemia: Secondary | ICD-10-CM

## 2022-01-03 DIAGNOSIS — E039 Hypothyroidism, unspecified: Secondary | ICD-10-CM

## 2022-01-03 DIAGNOSIS — Z794 Long term (current) use of insulin: Secondary | ICD-10-CM

## 2022-01-03 DIAGNOSIS — I10 Essential (primary) hypertension: Secondary | ICD-10-CM | POA: Diagnosis not present

## 2022-01-03 DIAGNOSIS — J069 Acute upper respiratory infection, unspecified: Secondary | ICD-10-CM

## 2022-01-03 LAB — POCT GLYCOSYLATED HEMOGLOBIN (HGB A1C): Hemoglobin A1C: 6.9 % — AB (ref 4.0–5.6)

## 2022-01-03 NOTE — Progress Notes (Signed)
Subjective:    Patient ID: Diana Deleon, female    DOB: 1961-10-26, 61 y.o.   MRN: [id number]  This visit occurred during the SARS-CoV-2 public health emergency.  Safety protocols were in place, including screening questions prior to the visit, additional usage of staff PPE, and extensive cleaning of exam room while observing appropriate contact time as indicated for disinfecting solutions.   HPI Pt presents for f/u of DM2  Wt Readings from Last 3 Encounters:  01/03/22 219 lb (99.3 kg)  10/27/21 219 lb 6 oz (99.5 kg)  09/13/21 221 lb (100.2 kg)   37.89 kg/m  Some good days and bad days  Coughing on and off  Did not take her cough medicine yet today  Some post nasal drip  Not a lot of wheezing  Mucous is clear    HTN bp is stable today  No cp or palpitations or headaches or edema  No side effects to medicines  BP Readings from Last 3 Encounters:  01/03/22 128/80  10/27/21 130/80  09/13/21 132/88     Losartan hct 100-25 mg daily  Metoprolol xl 50 mg daily   Pulse Readings from Last 3 Encounters:  01/03/22 89  10/27/21 63  09/13/21 66   Lab Results  Component Value Date   CREATININE 1.01 05/10/2021   BUN 16 05/10/2021   NA 140 05/10/2021   K 3.5 05/10/2021   CL 99 05/10/2021   CO2 33 (H) 05/10/2021     DM2 Last visit a1c was up Lab Results  Component Value Date   HGBA1C 9.8 (A) 09/13/2021   Last visit discussed some challenges with diet due to pickiness and gagging with certain textures   Doing better with addn of trulicity  Some readings of 75-80s fasting -makes her eat breakfast /oatmeal  170s tp 180s post prandial  Does not want to eat as much with the trulicity   Has not tried anything new with her diet  Some fruit  Not a lot of veggies- but does tolerate salad   Exercise (when not sick) -does a sitting program  Going to have knee surgery soon , replacement (2nd one)    A1c down to 6.9   Glipizide xl 10 mg daily  Levemir 70 u  Trulicity  7.62 mg weekly  Lab Results  Component Value Date   CHOL 147 05/10/2021   HDL 52.30 05/10/2021   LDLCALC 73 05/10/2021   TRIG 110.0 05/10/2021   CHOLHDL 3 05/10/2021   Crestor 5 mg   Patient Active Problem List   Diagnosis Date Noted   Abnormal urine odor 09/13/2021   Neck pain 09/13/2021   Dysphagia 09/13/2021   Blister of foot 06/12/2021   Current use of proton pump inhibitor 05/09/2021   Foot pain, bilateral 11/03/2020   S/P TKR (total knee replacement) using cement, left 06/21/2020   Osteoarthritis 04/28/2020   Hyperlipidemia associated with type 2 diabetes mellitus ([place]) 11/02/2018   Low back pain 03/21/2018   Osteopenia 01/05/2018   Aromatase inhibitor use 12/25/2017   Arthralgia 07/03/2017   Chronic pain of left knee 07/03/2017   Class 2 severe obesity due to excess calories with serious comorbidity and body mass index (BMI) of 37.0 to 37.9 in adult [facility]) 07/03/2017   Trigger ring finger of right hand 07/03/2017   Psoriasis 11/02/2016   Colon cancer screening 11/02/2016   Class 2 severe obesity due to excess calories with serious comorbidity and body mass index (BMI) of 36.0 to 36.9 in  adult ([place]) 10/31/2015   History of breast cancer 10/25/2014   Viral URI with cough 11/25/2013   Type 2 diabetes mellitus with hyperglycemia ([place]) 07/14/2013   Vaginal intraepithelial neoplasia grade 1 07/09/2013   Routine general medical examination at a health care facility 10/12/2012   Hypothyroidism 06/03/2012   Overactive bladder 05/27/2012   HSV infection 07/07/2010   URINARY URGENCY 07/15/2009   LOW BACK PAIN, CHRONIC 05/17/2008   HEADACHE, CHRONIC 05/17/2008   Essential hypertension 02/12/2008   Past Medical History:  Diagnosis Date   Anemia    "off and on"   Arthritis    "right toes" (03/24/2015)   Cancer of right breast ([place]) 2015   Cyst of cystic duct    chest- central location, taking Cephalexin currently   Diabetes mellitus without complication ([place])    Fibroids     hyst. 2006   GERD (gastroesophageal reflux disease)    History of abnormal Pap smear    has had leep in past   History of cold sores    has used valtrex in past   History of recurrent UTIs    Hypertension    Hypothyroidism    Intermittent vertigo    Seborrheic dermatitis    local on R scalp and in ear (failed mult meds- uses steroid spray)   Thyroid disease    Past Surgical History:  Procedure Laterality Date   BREAST RECONSTRUCTION WITH PLACEMENT OF TISSUE EXPANDER AND FLEX HD (ACELLULAR HYDRATED DERMIS) Right 11/25/2014   Procedure: RIGHT BREAST RECONSTRUCTION WITH PLACEMENT OF TISSUE EXPANDER AND USE OF ACELLULAR DERMRAL MATRIX ;  Surgeon: Crissie Reese, MD;  Location: [place];  Service: Plastics;  Laterality: Right;   BREAST REDUCTION SURGERY Left 03/24/2015   Procedure: LEFT BREAST REDUCTION  (BREAST);  Surgeon: Crissie Reese, MD;  Location: [place];  Service: Plastics;  Laterality: Left;   BREAST SURGERY Right    mastectomy  lymph node removal   COLONOSCOPY     COLONOSCOPY WITH PROPOFOL N/A 12/28/2016   Procedure: COLONOSCOPY WITH PROPOFOL;  Surgeon: Jonathon Bellows, MD;  Location: [facility];  Service: Endoscopy;  Laterality: N/A;   LAPAROSCOPIC CHOLECYSTECTOMY  2003   LITHOTRIPSY  ~ 2007   REMOVAL OF TISSUE EXPANDER Right 03/24/2015   REMOVAL OF TISSUE EXPANDER AND PLACEMENT OF IMPLANT Right 03/24/2015   w/delayed construction   REMOVAL OF TISSUE EXPANDER AND PLACEMENT OF IMPLANT Right 03/24/2015   Procedure: REMOVAL OF RIGHT BREAST TISSUE EXPANDER AND DELAYED BREAST RECONSTRUCTION WITH PLACEMENT OF IMPLANT;  Surgeon: Crissie Reese, MD;  Location: [place];  Service: Plastics;  Laterality: Right;   SIMPLE MASTECTOMY WITH AXILLARY SENTINEL NODE BIOPSY Right 11/25/2014   Procedure: RIGHT TOTAL  MASTECTOMY WITH AXILLARY SENTINEL NODE BIOPSY ;  Surgeon: Excell Seltzer, MD;  Location: [place];  Service: General;  Laterality: Right;   TOTAL KNEE ARTHROPLASTY Left 06/21/2020   Procedure: LEFT  TOTAL KNEE ARTHROPLASTY;  Surgeon: Hessie Knows, MD;  Location: [facility];  Service: Orthopedics;  Laterality: Left;   TUBAL LIGATION     VAGINAL DELIVERY  x3   VAGINAL HYSTERECTOMY  2006   "fibroids"   Social History   Tobacco Use   Smoking status: Never   Smokeless tobacco: Never  Vaping Use   Vaping Use: Never used  Substance Use Topics   Alcohol use: No    Alcohol/week: 0.0 standard drinks   Drug use: No   Family History  Problem Relation Age of Onset   Alcohol abuse Father  COPD Father    Hypertension Father    Hypertension Mother    Parkinson's disease Mother    Allergies  Allergen Reactions   Metformin And Related Diarrhea   Norvasc [Amlodipine Besylate] Swelling   Current Outpatient Medications on File Prior to Visit  Medication Sig Dispense Refill   albuterol (VENTOLIN HFA) 108 (90 Base) MCG/ACT inhaler Inhale 2 puffs into the lungs every 6 (six) hours as needed for wheezing or shortness of breath. 8 g 1   alendronate (FOSAMAX) 70 MG tablet TAKE 1 TABLET BY MOUTH EVERY 7 DAYS. TAKE WITH A FULL GLASS OF WATER ON AN EMPTY STOMACH. 12 tablet 1   aspirin EC 81 MG tablet Take 81 mg by mouth daily.     benzonatate (TESSALON) 200 MG capsule Take 200 mg by mouth 3 (three) times daily as needed for cough.     Calcium Carb-Cholecalciferol 600-800 MG-UNIT TABS Take 2 tablets by mouth daily.     Dulaglutide (TRULICITY) 5.03 TW/6.5KC SOPN Inject 0.75 mg into the skin once a week. 5 mL 3   glipiZIDE (GLUCOTROL XL) 10 MG 24 hr tablet Take 1 tablet (10 mg total) by mouth daily with breakfast. 90 tablet 3   glucose blood (ONETOUCH VERIO) test strip USE TO CHECK BLOOD SUGAR ONCE DAILY AND AS NEEDED 100 each 2   insulin detemir (LEVEMIR FLEXTOUCH) 100 UNIT/ML FlexPen INJECT 65 UNITS INTO SKIN AT BEDTIME 15 mL 5   levothyroxine (SYNTHROID) 50 MCG tablet TAKE 1 TABLET(50 MCG) BY MOUTH DAILY 90 tablet 0   losartan-hydrochlorothiazide (HYZAAR) 100-25 MG tablet Take 1 tablet by mouth  daily. 90 tablet 3   meloxicam (MOBIC) 7.5 MG tablet Take 7.5 mg by mouth daily.     metoprolol succinate (TOPROL-XL) 50 MG 24 hr tablet Take 1 tablet (50 mg total) by mouth daily. Take with or immediately following a meal. 90 tablet 3   omeprazole (PRILOSEC) 20 MG capsule TAKE 1 CAPSULE BY MOUTH DAILY BEFORE BREAKFAST 90 capsule 3   ONETOUCH DELICA LANCETS FINE MISC Use to check blood sugar once daily and as needed (dx. E11.9) 100 each 1   Risankizumab-rzaa (SKYRIZI, 150 MG DOSE, [place]) Inject into the skin. Take as directed (every 3 month)     rosuvastatin (CRESTOR) 5 MG tablet Take 1 tablet (5 mg total) by mouth daily. 90 tablet 3   valACYclovir (VALTREX) 500 MG tablet TAKE 1 TABLET(500 MG) BY MOUTH TWICE DAILY FOR 5 DAYS FOR OUTBREAK 10 tablet 1   venlafaxine XR (EFFEXOR-XR) 75 MG 24 hr capsule TAKE 1 CAPSULE(75 MG) BY MOUTH DAILY WITH BREAKFAST 90 capsule 3   No current facility-administered medications on file prior to visit.     Review of Systems  Constitutional:  Negative for activity change, appetite change, fatigue, fever and unexpected weight change.  HENT:  Negative for congestion, ear pain, rhinorrhea, sinus pressure and sore throat.   Eyes:  Negative for pain, redness and visual disturbance.  Respiratory:  Positive for cough. Negative for shortness of breath and wheezing.   Cardiovascular:  Negative for chest pain and palpitations.  Gastrointestinal:  Negative for abdominal pain, blood in stool, constipation and diarrhea.  Endocrine: Negative for polydipsia and polyuria.  Genitourinary:  Negative for dysuria, frequency and urgency.  Musculoskeletal:  Negative for arthralgias, back pain and myalgias.  Skin:  Negative for pallor and rash.  Allergic/Immunologic: Negative for environmental allergies.  Neurological:  Negative for dizziness, syncope and headaches.  Hematological:  Negative for adenopathy. Does not  bruise/bleed easily.  Psychiatric/Behavioral:  Negative for decreased  concentration and dysphoric mood. The patient is not nervous/anxious.       Objective:   Physical Exam Constitutional:      General: She is not in acute distress.    Appearance: Normal appearance. She is well-developed. She is obese. She is not ill-appearing.  HENT:     Head: Normocephalic and atraumatic.  Eyes:     Conjunctiva/sclera: Conjunctivae normal.     Pupils: Pupils are equal, round, and reactive to light.  Neck:     Thyroid: No thyromegaly.     Vascular: No carotid bruit or JVD.  Cardiovascular:     Rate and Rhythm: Normal rate and regular rhythm.     Heart sounds: Normal heart sounds.    No gallop.  Pulmonary:     Effort: Pulmonary effort is normal. No respiratory distress.     Breath sounds: Normal breath sounds. No wheezing or rales.     Comments: Scant wheeze on forced exp only  Good air ech No crackles or rales Abdominal:     General: Bowel sounds are normal. There is no distension or abdominal bruit.     Palpations: Abdomen is soft. There is no mass.     Tenderness: There is no abdominal tenderness.  Musculoskeletal:     Cervical back: Normal range of motion and neck supple.     Right lower leg: No edema.     Left lower leg: No edema.  Lymphadenopathy:     Cervical: No cervical adenopathy.  Skin:    General: Skin is warm and dry.     Coloration: Skin is not pale.     Findings: No rash.  Neurological:     Mental Status: She is alert.     Coordination: Coordination normal.     Deep Tendon Reflexes: Reflexes are normal and symmetric. Reflexes normal.  Psychiatric:        Mood and Affect: Mood normal.          Assessment & Plan:   Problem List Items Addressed This Visit       Cardiovascular and Mediastinum   Essential hypertension    bp in fair control at this time  BP Readings from Last 1 Encounters:  01/03/22 128/80  No changes needed Most recent labs reviewed  Disc lifstyle change with low sodium diet and exercise  Plan to  continue Losartan hct 100-25 mg daily  Metoprolol xl 50 mg daily        Respiratory   Viral URI with cough    Gradually improving  Cough is not as bad  Nl lung exam today        Endocrine   Hypothyroidism    TSH and FT4 nl  Thyroid nodule on [place]- did not req bx      Type 2 diabetes mellitus with hyperglycemia ([facility]) - Primary    Improved with addn of trulicity Lab Results  Component Value Date   HGBA1C 6.9 (A) 01/03/2022   Plan to continue glipizide xl 10 mg daily (can reduce if hypoglycemia) Reduce levemir to 65 u  trulicity 4.48 mg weekly /tolerated /hope it will help wt loss  Taking arb and statin       Relevant Orders   POCT glycosylated hemoglobin (Hb A1C) (Completed)   Hyperlipidemia associated with type 2 diabetes mellitus ([place])    Disc goals for lipids and reasons to control them Rev last labs with pt Rev low sat fat diet  in detail LDL 73 Continues crestor 5 mg daily

## 2022-01-03 NOTE — Assessment & Plan Note (Signed)
Disc goals for lipids and reasons to control them Rev last labs with pt Rev low sat fat diet in detail LDL 73 Continues crestor 5 mg daily

## 2022-01-03 NOTE — Assessment & Plan Note (Signed)
Gradually improving  Cough is not as bad  Nl lung exam today

## 2022-01-03 NOTE — Assessment & Plan Note (Signed)
TSH and FT4 nl  Thyroid nodule on [place]- did not req bx

## 2022-01-03 NOTE — Patient Instructions (Addendum)
Go back to levemir 65  If am low sugar continues let me know and we will cut glipizide   Add more exercise when you can  The sitting program is great   Keep working on a healthy diet   Follow up in 3 months for annual exam with labs prior

## 2022-01-03 NOTE — Assessment & Plan Note (Signed)
Improved with addn of trulicity Lab Results  Component Value Date   HGBA1C 6.9 (A) 01/03/2022   Plan to continue glipizide xl 10 mg daily (can reduce if hypoglycemia) Reduce levemir to 65 u  trulicity 7.34 mg weekly /tolerated /hope it will help wt loss  Taking arb and statin

## 2022-01-03 NOTE — Assessment & Plan Note (Signed)
bp in fair control at this time  BP Readings from Last 1 Encounters:  01/03/22 128/80   No changes needed Most recent labs reviewed  Disc lifstyle change with low sodium diet and exercise  Plan to continue Losartan hct 100-25 mg daily  Metoprolol xl 50 mg daily

## 2022-01-12 ENCOUNTER — Other Ambulatory Visit: Payer: Self-pay | Admitting: Orthopedic Surgery

## 2022-01-16 DIAGNOSIS — L298 Other pruritus: Secondary | ICD-10-CM | POA: Diagnosis not present

## 2022-01-16 DIAGNOSIS — L400 Psoriasis vulgaris: Secondary | ICD-10-CM | POA: Diagnosis not present

## 2022-01-16 DIAGNOSIS — Z79899 Other long term (current) drug therapy: Secondary | ICD-10-CM | POA: Diagnosis not present

## 2022-01-16 DIAGNOSIS — L538 Other specified erythematous conditions: Secondary | ICD-10-CM | POA: Diagnosis not present

## 2022-01-16 DIAGNOSIS — L820 Inflamed seborrheic keratosis: Secondary | ICD-10-CM | POA: Diagnosis not present

## 2022-01-30 ENCOUNTER — Other Ambulatory Visit
Admission: RE | Admit: 2022-01-30 | Discharge: 2022-01-30 | Disposition: A | Discharge status: Home / Self Care | Payer: BC Managed Care – PPO | Source: Ambulatory Visit | Attending: Orthopedic Surgery | Admitting: Orthopedic Surgery

## 2022-01-30 ENCOUNTER — Other Ambulatory Visit: Payer: Self-pay

## 2022-01-30 VITALS — BP 134/93 | Resp 18 | Ht 63.75 in | Wt 218.7 lb

## 2022-01-30 DIAGNOSIS — Z01812 Encounter for preprocedural laboratory examination: Secondary | ICD-10-CM | POA: Insufficient documentation

## 2022-01-30 HISTORY — DX: Headache, unspecified: R51.9

## 2022-01-30 LAB — URINALYSIS, ROUTINE W REFLEX MICROSCOPIC
Bilirubin Urine: NEGATIVE
Glucose, UA: NEGATIVE mg/dL
Hgb urine dipstick: NEGATIVE
Ketones, ur: NEGATIVE mg/dL
Leukocytes,Ua: NEGATIVE
Nitrite: NEGATIVE
Protein, ur: NEGATIVE mg/dL
Specific Gravity, Urine: 1.024 (ref 1.005–1.030)
pH: 5 (ref 5.0–8.0)

## 2022-01-30 LAB — SURGICAL PCR SCREEN
MRSA, PCR: NEGATIVE
Staphylococcus aureus: NEGATIVE

## 2022-01-30 LAB — CBC WITH DIFFERENTIAL/PLATELET
Abs Immature Granulocytes: 0.01 10*3/uL (ref 0.00–0.07)
Basophils Absolute: 0 10*3/uL (ref 0.0–0.1)
Basophils Relative: 1 %
Eosinophils Absolute: 0.1 10*3/uL (ref 0.0–0.5)
Eosinophils Relative: 3 %
HCT: 43.8 % (ref 36.0–46.0)
Hemoglobin: 15 g/dL (ref 12.0–15.0)
Immature Granulocytes: 0 %
Lymphocytes Relative: 24 %
Lymphs Abs: 1.3 10*3/uL (ref 0.7–4.0)
MCH: 30.4 pg (ref 26.0–34.0)
MCHC: 34.2 g/dL (ref 30.0–36.0)
MCV: 88.8 fL (ref 80.0–100.0)
Monocytes Absolute: 0.4 10*3/uL (ref 0.1–1.0)
Monocytes Relative: 7 %
Neutro Abs: 3.7 10*3/uL (ref 1.7–7.7)
Neutrophils Relative %: 65 %
Platelets: 229 10*3/uL (ref 150–400)
RBC: 4.93 MIL/uL (ref 3.87–5.11)
RDW: 13 % (ref 11.5–15.5)
WBC: 5.7 10*3/uL (ref 4.0–10.5)
nRBC: 0 % (ref 0.0–0.2)

## 2022-01-30 LAB — COMPREHENSIVE METABOLIC PANEL
ALT: 22 U/L (ref 0–44)
AST: 22 U/L (ref 15–41)
Albumin: 3.9 g/dL (ref 3.5–5.0)
Alkaline Phosphatase: 87 U/L (ref 38–126)
Anion gap: 6 (ref 5–15)
BUN: 14 mg/dL (ref 6–20)
CO2: 32 mmol/L (ref 22–32)
Calcium: 9.1 mg/dL (ref 8.9–10.3)
Chloride: 99 mmol/L (ref 98–111)
Creatinine, Ser: 0.95 mg/dL (ref 0.44–1.00)
GFR, Estimated: >60 mL/min (ref >60)
Glucose, Bld: 168 mg/dL — ABNORMAL HIGH (ref 70–99)
Potassium: 3.4 mmol/L — ABNORMAL LOW (ref 3.5–5.1)
Sodium: 137 mmol/L (ref 135–145)
Total Bilirubin: 0.7 mg/dL (ref 0.3–1.2)
Total Protein: 7 g/dL (ref 6.5–8.1)

## 2022-01-30 LAB — TYPE AND SCREEN
ABO/RH(D): O POS
Antibody Screen: NEGATIVE

## 2022-01-30 NOTE — Patient Instructions (Addendum)
Your procedure is scheduled on: 02/13/22 - Tuesday Report to the Registration Desk on the 1st floor of the [facility]. To find out your arrival time, please call [phone] between 1PM - 3PM on: 02/12/22 - Monday Covid Test on 02/09/22 at 8 am at the [facility].  REMEMBER: Instructions that are not followed completely may result in serious medical risk, up to and including death; or upon the discretion of your surgeon and anesthesiologist your surgery may need to be rescheduled.  Do not eat food after midnight the night before surgery.  No gum chewing, lozengers or hard candies.  You may however, drink CLEAR liquids up to 2 hours before you are scheduled to arrive for your surgery. Do not drink anything within 2 hours of your scheduled arrival time.  Clear liquids include: - water -Type 1 and Type 2 diabetics should only drink water.  TAKE THESE MEDICATIONS THE MORNING OF SURGERY WITH A SIP OF WATER:  - levothyroxine (SYNTHROID) 50 MCG tablet - metoprolol succinate (TOPROL-XL) 50 MG 24 hr tablet - omeprazole (PRILOSEC) 20 MG capsule, (take one the night before and one on the morning of surgery - helps to prevent nausea after surgery.) - venlafaxine XR (EFFEXOR-XR) 75 MG 24 hr capsule  - Inject only 1/2 of your insulin detemir (LEVEMIR FLEXTOUCH) 100 UNIT/ML FlexPen the night before your surgery.  Follow recommendations from Cardiologist, Pulmonologist or PCP regarding stopping Aspirin, Coumadin, Plavix, Eliquis, Pradaxa, or Pletal.  One week prior to surgery: meloxicam (MOBIC) 7.5 MG tablet Stop Anti-inflammatories (NSAIDS) such as Advil, Aleve, Ibuprofen, Motrin, Naproxen, Naprosyn and Aspirin based products such as Excedrin, Goodys Powder, BC Powder.  Stop 02/05/22, ANY OVER THE COUNTER supplements until after surgery.Calcium Carb-Cholecalciferol 600-800 MG-UNIT TABS, Multiple Vitamin (MULTIVITAMIN WITH MINERALS) TABS tablet.  You may take Tylenol if needed for pain  up until the day of surgery.  No Alcohol for 24 hours before or after surgery.  No Smoking including e-cigarettes for 24 hours prior to surgery.  No chewable tobacco products for at least 6 hours prior to surgery.  No nicotine patches on the day of surgery.  Do not use any "recreational" drugs for at least a week prior to your surgery.  Please be advised that the combination of cocaine and anesthesia may have negative outcomes, up to and including death. If you test positive for cocaine, your surgery will be cancelled.  On the morning of surgery brush your teeth with toothpaste and water, you may rinse your mouth with mouthwash if you wish. Do not swallow any toothpaste or mouthwash.  Use CHG Soap or wipes as directed on instruction sheet.  Do not wear jewelry, make-up, hairpins, clips or nail polish.  Do not wear lotions, powders, or perfumes.   Do not shave body from the neck down 48 hours prior to surgery just in case you cut yourself which could leave a site for infection.  Also, freshly shaved skin may become irritated if using the CHG soap.  Contact lenses, hearing aids and dentures may not be worn into surgery.  Do not bring valuables to the hospital. [facility] is not responsible for any missing/lost belongings or valuables.   Notify your doctor if there is any change in your medical condition (cold, fever, infection).  Wear comfortable clothing (specific to your surgery type) to the hospital.  After surgery, you can help prevent lung complications by doing breathing exercises.  Take deep breaths and cough every 1-2 hours. Your doctor may  order a device called an Incentive Spirometer to help you take deep breaths. When coughing or sneezing, hold a pillow firmly against your incision with both hands. This is called splinting. Doing this helps protect your incision. It also decreases belly discomfort.  If you are being admitted to the hospital overnight, leave your  suitcase in the car. After surgery it may be brought to your room.  If you are being discharged the day of surgery, you will not be allowed to drive home. You will need a responsible adult (18 years or older) to drive you home and stay with you that night.   If you are taking public transportation, you will need to have a responsible adult (18 years or older) with you. Please confirm with your physician that it is acceptable to use public transportation.   Please call the [place] Dept. at [phone] if you have any questions about these instructions.  Surgery Visitation Policy:  Patients undergoing a surgery or procedure may have one family member or support person with them as long as that person is not COVID-19 positive or experiencing its symptoms.  That person may remain in the waiting area during the procedure and may rotate out with other people.  Inpatient Visitation:    Visiting hours are 7 a.m. to 8 p.m. Up to two visitors ages 16+ are allowed at one time in a patient room. The visitors may rotate out with other people during the day. Visitors must check out when they leave, or other visitors will not be allowed. One designated support person may remain overnight. The visitor must pass COVID-19 screenings, use hand sanitizer when entering and exiting the patients room and wear a mask at all times, including in the patients room. Patients must also wear a mask when staff or their visitor are in the room. Masking is required regardless of vaccination status.

## 2022-02-07 ENCOUNTER — Other Ambulatory Visit: Payer: Self-pay | Admitting: Family Medicine

## 2022-02-09 ENCOUNTER — Other Ambulatory Visit: Payer: Self-pay

## 2022-02-09 ENCOUNTER — Other Ambulatory Visit
Admission: RE | Admit: 2022-02-09 | Discharge: 2022-02-09 | Disposition: A | Discharge status: Home / Self Care | Payer: BC Managed Care – PPO | Source: Ambulatory Visit | Attending: Orthopedic Surgery | Admitting: Orthopedic Surgery

## 2022-02-09 DIAGNOSIS — Z01812 Encounter for preprocedural laboratory examination: Secondary | ICD-10-CM | POA: Insufficient documentation

## 2022-02-09 DIAGNOSIS — Z20822 Contact with and (suspected) exposure to covid-19: Secondary | ICD-10-CM | POA: Diagnosis not present

## 2022-02-09 DIAGNOSIS — Z01818 Encounter for other preprocedural examination: Secondary | ICD-10-CM

## 2022-02-10 LAB — SARS CORONAVIRUS 2 (TAT 6-24 HRS): SARS Coronavirus 2: NEGATIVE

## 2022-02-12 MED ORDER — CEFAZOLIN SODIUM-DEXTROSE 2-4 GM/100ML-% IV SOLN
2.0000 g | INTRAVENOUS | Status: AC
  Administered 2022-02-13: 2 g via INTRAVENOUS

## 2022-02-12 MED ORDER — SODIUM CHLORIDE 0.9 % IV SOLN: INTRAVENOUS | Status: DC

## 2022-02-12 MED ORDER — CHLORHEXIDINE GLUCONATE 0.12 % MT SOLN: 15.0000 mL | Freq: Once | OROMUCOSAL | Status: AC

## 2022-02-12 MED ORDER — ORAL CARE MOUTH RINSE: 15.0000 mL | Freq: Once | OROMUCOSAL | Status: AC

## 2022-02-13 ENCOUNTER — Encounter: Payer: Self-pay | Admitting: Orthopedic Surgery

## 2022-02-13 ENCOUNTER — Other Ambulatory Visit: Payer: Self-pay

## 2022-02-13 ENCOUNTER — Encounter
Admission: RE | Disposition: A | Discharge status: Home / Self Care | Payer: Self-pay | Source: Home / Self Care | Attending: Orthopedic Surgery

## 2022-02-13 ENCOUNTER — Observation Stay
Admission: RE | Admit: 2022-02-13 | Discharge: 2022-02-14 | Disposition: A | Discharge status: Home / Self Care | Payer: BC Managed Care – PPO | Attending: Orthopedic Surgery | Admitting: Orthopedic Surgery

## 2022-02-13 ENCOUNTER — Observation Stay: Payer: BC Managed Care – PPO

## 2022-02-13 ENCOUNTER — Ambulatory Visit: Payer: BC Managed Care – PPO | Admitting: Anesthesiology

## 2022-02-13 ENCOUNTER — Other Ambulatory Visit: Payer: Self-pay | Admitting: Family Medicine

## 2022-02-13 DIAGNOSIS — Z96651 Presence of right artificial knee joint: Secondary | ICD-10-CM

## 2022-02-13 DIAGNOSIS — Z96652 Presence of left artificial knee joint: Secondary | ICD-10-CM | POA: Insufficient documentation

## 2022-02-13 DIAGNOSIS — Z7982 Long term (current) use of aspirin: Secondary | ICD-10-CM | POA: Diagnosis not present

## 2022-02-13 DIAGNOSIS — Z853 Personal history of malignant neoplasm of breast: Secondary | ICD-10-CM | POA: Insufficient documentation

## 2022-02-13 DIAGNOSIS — I10 Essential (primary) hypertension: Secondary | ICD-10-CM | POA: Insufficient documentation

## 2022-02-13 DIAGNOSIS — Z794 Long term (current) use of insulin: Secondary | ICD-10-CM | POA: Insufficient documentation

## 2022-02-13 DIAGNOSIS — M1711 Unilateral primary osteoarthritis, right knee: Principal | ICD-10-CM | POA: Insufficient documentation

## 2022-02-13 DIAGNOSIS — E1165 Type 2 diabetes mellitus with hyperglycemia: Secondary | ICD-10-CM | POA: Diagnosis not present

## 2022-02-13 DIAGNOSIS — Z7984 Long term (current) use of oral hypoglycemic drugs: Secondary | ICD-10-CM | POA: Diagnosis not present

## 2022-02-13 DIAGNOSIS — Z79899 Other long term (current) drug therapy: Secondary | ICD-10-CM | POA: Diagnosis not present

## 2022-02-13 DIAGNOSIS — Z9889 Other specified postprocedural states: Secondary | ICD-10-CM | POA: Diagnosis not present

## 2022-02-13 DIAGNOSIS — E039 Hypothyroidism, unspecified: Secondary | ICD-10-CM | POA: Diagnosis not present

## 2022-02-13 DIAGNOSIS — Z471 Aftercare following joint replacement surgery: Secondary | ICD-10-CM | POA: Diagnosis not present

## 2022-02-13 DIAGNOSIS — G8918 Other acute postprocedural pain: Secondary | ICD-10-CM

## 2022-02-13 DIAGNOSIS — J449 Chronic obstructive pulmonary disease, unspecified: Secondary | ICD-10-CM | POA: Diagnosis not present

## 2022-02-13 HISTORY — PX: TOTAL KNEE ARTHROPLASTY: SHX125

## 2022-02-13 LAB — CBC
HCT: 39.1 % (ref 36.0–46.0)
Hemoglobin: 13.5 g/dL (ref 12.0–15.0)
MCH: 30.5 pg (ref 26.0–34.0)
MCHC: 34.5 g/dL (ref 30.0–36.0)
MCV: 88.5 fL (ref 80.0–100.0)
Platelets: 218 10*3/uL (ref 150–400)
RBC: 4.42 MIL/uL (ref 3.87–5.11)
RDW: 12.8 % (ref 11.5–15.5)
WBC: 9.1 10*3/uL (ref 4.0–10.5)
nRBC: 0 % (ref 0.0–0.2)

## 2022-02-13 LAB — GLUCOSE, CAPILLARY
Glucose-Capillary: 164 mg/dL — ABNORMAL HIGH (ref 70–99)
Glucose-Capillary: 153 mg/dL — ABNORMAL HIGH (ref 70–99)
Glucose-Capillary: 204 mg/dL — ABNORMAL HIGH (ref 70–99)
Glucose-Capillary: 260 mg/dL — ABNORMAL HIGH (ref 70–99)

## 2022-02-13 LAB — CREATININE, SERUM
Creatinine, Ser: 1 mg/dL (ref 0.44–1.00)
GFR, Estimated: >60 mL/min (ref >60)

## 2022-02-13 SURGERY — ARTHROPLASTY, KNEE, TOTAL
Anesthesia: Spinal | Site: Knee | Laterality: Right

## 2022-02-13 MED ORDER — DEXAMETHASONE SODIUM PHOSPHATE 10 MG/ML IJ SOLN
INTRAMUSCULAR | Status: DC | PRN
  Administered 2022-02-13: 5 mg via INTRAVENOUS

## 2022-02-13 MED ORDER — METOCLOPRAMIDE HCL 5 MG/ML IJ SOLN: 5.0000 mg | Freq: Three times a day (TID) | INTRAMUSCULAR | Status: DC | PRN

## 2022-02-13 MED ORDER — ASPIRIN EC 81 MG PO TBEC
81.0000 mg | DELAYED_RELEASE_TABLET | Freq: Every day | ORAL | Status: DC
  Administered 2022-02-14: 81 mg via ORAL
  Filled 2022-02-13: qty 1

## 2022-02-13 MED ORDER — SODIUM CHLORIDE (PF) 0.9 % IJ SOLN
INTRAMUSCULAR | Status: DC | PRN
  Administered 2022-02-13: 91 mL via INTRAMUSCULAR

## 2022-02-13 MED ORDER — LIDOCAINE HCL (CARDIAC) PF 100 MG/5ML IV SOSY
PREFILLED_SYRINGE | INTRAVENOUS | Status: DC | PRN
Start: 2022-02-13 — End: 2022-02-13
  Administered 2022-02-13: 100 mg via INTRAVENOUS

## 2022-02-13 MED ORDER — CEFAZOLIN SODIUM-DEXTROSE 2-4 GM/100ML-% IV SOLN
INTRAVENOUS | Status: AC
  Administered 2022-02-13: 2 g via INTRAVENOUS
  Filled 2022-02-13: qty 100

## 2022-02-13 MED ORDER — FENTANYL CITRATE (PF) 100 MCG/2ML IJ SOLN: 25.0000 ug | INTRAMUSCULAR | Status: DC | PRN

## 2022-02-13 MED ORDER — OXYCODONE HCL 5 MG PO TABS: 5.0000 mg | ORAL_TABLET | Freq: Once | ORAL | Status: DC | PRN

## 2022-02-13 MED ORDER — ACETAMINOPHEN 325 MG PO TABS: 325.0000 mg | ORAL_TABLET | Freq: Four times a day (QID) | ORAL | Status: DC | PRN

## 2022-02-13 MED ORDER — ONDANSETRON HCL 4 MG/2ML IJ SOLN: 4.0000 mg | Freq: Four times a day (QID) | INTRAMUSCULAR | Status: DC | PRN

## 2022-02-13 MED ORDER — BUPIVACAINE HCL (PF) 0.5 % IJ SOLN
INTRAMUSCULAR | Status: AC
  Filled 2022-02-13: qty 10

## 2022-02-13 MED ORDER — ASPIRIN EC 81 MG PO TBEC: 81.0000 mg | DELAYED_RELEASE_TABLET | Freq: Every morning | ORAL | Status: DC

## 2022-02-13 MED ORDER — OYSTER SHELL CALCIUM/D3 500-5 MG-MCG PO TABS
2.0000 | ORAL_TABLET | Freq: Every evening | ORAL | Status: DC
  Filled 2022-02-13 (×2): qty 2

## 2022-02-13 MED ORDER — LOSARTAN POTASSIUM-HCTZ 100-25 MG PO TABS: 1.0000 | ORAL_TABLET | Freq: Every day | ORAL | Status: DC

## 2022-02-13 MED ORDER — ADULT MULTIVITAMIN W/MINERALS CH
1.0000 | ORAL_TABLET | Freq: Every evening | ORAL | Status: DC
  Administered 2022-02-13: 1 via ORAL
  Filled 2022-02-13 (×2): qty 1

## 2022-02-13 MED ORDER — SODIUM CHLORIDE 0.9 % IV SOLN: INTRAVENOUS | Status: DC

## 2022-02-13 MED ORDER — MORPHINE SULFATE (PF) 10 MG/ML IV SOLN
INTRAVENOUS | Status: AC
  Filled 2022-02-13: qty 1

## 2022-02-13 MED ORDER — ONDANSETRON HCL 4 MG PO TABS: 4.0000 mg | ORAL_TABLET | Freq: Four times a day (QID) | ORAL | Status: DC | PRN

## 2022-02-13 MED ORDER — TRAMADOL HCL 50 MG PO TABS
ORAL_TABLET | ORAL | Status: AC
  Filled 2022-02-13: qty 1

## 2022-02-13 MED ORDER — LOSARTAN POTASSIUM 50 MG PO TABS
100.0000 mg | ORAL_TABLET | Freq: Every day | ORAL | Status: DC
  Filled 2022-02-13: qty 2

## 2022-02-13 MED ORDER — DIPHENHYDRAMINE HCL 12.5 MG/5ML PO ELIX: 12.5000 mg | ORAL_SOLUTION | ORAL | Status: DC | PRN

## 2022-02-13 MED ORDER — BISACODYL 5 MG PO TBEC
5.0000 mg | DELAYED_RELEASE_TABLET | Freq: Every day | ORAL | Status: DC | PRN
  Filled 2022-02-13: qty 1

## 2022-02-13 MED ORDER — CEFAZOLIN SODIUM-DEXTROSE 2-4 GM/100ML-% IV SOLN
INTRAVENOUS | Status: AC
  Filled 2022-02-13: qty 100

## 2022-02-13 MED ORDER — METOCLOPRAMIDE HCL 10 MG PO TABS: 5.0000 mg | ORAL_TABLET | Freq: Three times a day (TID) | ORAL | Status: DC | PRN

## 2022-02-13 MED ORDER — ONDANSETRON HCL 4 MG/2ML IJ SOLN: 4.0000 mg | Freq: Once | INTRAMUSCULAR | Status: DC | PRN

## 2022-02-13 MED ORDER — MIDAZOLAM HCL 5 MG/5ML IJ SOLN
INTRAMUSCULAR | Status: DC | PRN
  Administered 2022-02-13: 2 mg via INTRAVENOUS

## 2022-02-13 MED ORDER — OXYCODONE HCL 5 MG/5ML PO SOLN: 5.0000 mg | Freq: Once | ORAL | Status: DC | PRN

## 2022-02-13 MED ORDER — ALBUTEROL SULFATE (2.5 MG/3ML) 0.083% IN NEBU: 2.5000 mg | INHALATION_SOLUTION | Freq: Four times a day (QID) | RESPIRATORY_TRACT | Status: DC | PRN

## 2022-02-13 MED ORDER — ROSUVASTATIN CALCIUM 5 MG PO TABS
5.0000 mg | ORAL_TABLET | Freq: Every day | ORAL | Status: DC
  Administered 2022-02-13: 5 mg via ORAL
  Filled 2022-02-13 (×2): qty 1

## 2022-02-13 MED ORDER — ENOXAPARIN SODIUM 30 MG/0.3ML IJ SOSY: 30.0000 mg | PREFILLED_SYRINGE | Freq: Two times a day (BID) | INTRAMUSCULAR | Status: DC

## 2022-02-13 MED ORDER — PRONTOSAN WOUND IRRIGATION OPTIME
TOPICAL | Status: DC | PRN
  Administered 2022-02-13: 1

## 2022-02-13 MED ORDER — ALBUTEROL SULFATE HFA 108 (90 BASE) MCG/ACT IN AERS: 2.0000 | INHALATION_SPRAY | Freq: Four times a day (QID) | RESPIRATORY_TRACT | Status: DC | PRN

## 2022-02-13 MED ORDER — LEVOTHYROXINE SODIUM 50 MCG PO TABS
50.0000 ug | ORAL_TABLET | Freq: Every day | ORAL | Status: DC
  Administered 2022-02-14: 50 ug via ORAL
  Filled 2022-02-13: qty 1

## 2022-02-13 MED ORDER — LACTATED RINGERS IV SOLN: INTRAVENOUS | Status: DC

## 2022-02-13 MED ORDER — ZOLPIDEM TARTRATE 5 MG PO TABS: 5.0000 mg | ORAL_TABLET | Freq: Every evening | ORAL | Status: DC | PRN

## 2022-02-13 MED ORDER — FLEET ENEMA 7-19 GM/118ML RE ENEM: 1.0000 | ENEMA | Freq: Once | RECTAL | Status: DC | PRN

## 2022-02-13 MED ORDER — HYDROCODONE-ACETAMINOPHEN 5-325 MG PO TABS
1.0000 | ORAL_TABLET | ORAL | Status: DC | PRN
  Administered 2022-02-14: 2 via ORAL

## 2022-02-13 MED ORDER — PANTOPRAZOLE SODIUM 40 MG PO TBEC
40.0000 mg | DELAYED_RELEASE_TABLET | Freq: Every day | ORAL | Status: DC
  Administered 2022-02-14: 40 mg via ORAL

## 2022-02-13 MED ORDER — TRAMADOL HCL 50 MG PO TABS
50.0000 mg | ORAL_TABLET | Freq: Four times a day (QID) | ORAL | Status: DC
  Administered 2022-02-13 – 2022-02-14 (×2): 50 mg via ORAL

## 2022-02-13 MED ORDER — MENTHOL 3 MG MT LOZG
1.0000 | LOZENGE | OROMUCOSAL | Status: DC | PRN
  Filled 2022-02-13: qty 9

## 2022-02-13 MED ORDER — ONDANSETRON HCL 4 MG/2ML IJ SOLN
INTRAMUSCULAR | Status: DC | PRN
  Administered 2022-02-13: 4 mg via INTRAVENOUS

## 2022-02-13 MED ORDER — HYDROCODONE-ACETAMINOPHEN 5-325 MG PO TABS
ORAL_TABLET | ORAL | Status: AC
  Administered 2022-02-13: 2 via ORAL
  Filled 2022-02-13: qty 2

## 2022-02-13 MED ORDER — CHLORHEXIDINE GLUCONATE 0.12 % MT SOLN
OROMUCOSAL | Status: AC
  Administered 2022-02-13: 15 mL via OROMUCOSAL
  Filled 2022-02-13: qty 15

## 2022-02-13 MED ORDER — SODIUM CHLORIDE 0.9 % IR SOLN: Status: DC | PRN

## 2022-02-13 MED ORDER — CEFAZOLIN SODIUM-DEXTROSE 2-4 GM/100ML-% IV SOLN
2.0000 g | Freq: Four times a day (QID) | INTRAVENOUS | Status: AC
  Administered 2022-02-13: 2 g via INTRAVENOUS
  Filled 2022-02-13 (×2): qty 100

## 2022-02-13 MED ORDER — METHOCARBAMOL 1000 MG/10ML IJ SOLN
500.0000 mg | Freq: Four times a day (QID) | INTRAVENOUS | Status: DC | PRN
  Filled 2022-02-13: qty 5

## 2022-02-13 MED ORDER — ALUM & MAG HYDROXIDE-SIMETH 200-200-20 MG/5ML PO SUSP: 30.0000 mL | ORAL | Status: DC | PRN

## 2022-02-13 MED ORDER — ACETAMINOPHEN 10 MG/ML IV SOLN: 1000.0000 mg | Freq: Once | INTRAVENOUS | Status: DC | PRN

## 2022-02-13 MED ORDER — MIDAZOLAM HCL 2 MG/2ML IJ SOLN
INTRAMUSCULAR | Status: AC
  Filled 2022-02-13: qty 2

## 2022-02-13 MED ORDER — INSULIN DETEMIR 100 UNIT/ML FLEXPEN: 65.0000 [IU] | PEN_INJECTOR | Freq: Every day | SUBCUTANEOUS | Status: DC

## 2022-02-13 MED ORDER — TRAMADOL HCL 50 MG PO TABS
ORAL_TABLET | ORAL | Status: AC
  Administered 2022-02-13: 50 mg via ORAL
  Filled 2022-02-13: qty 1

## 2022-02-13 MED ORDER — CALCIUM CARB-CHOLECALCIFEROL 600-800 MG-UNIT PO TABS: 2.0000 | ORAL_TABLET | Freq: Every evening | ORAL | Status: DC

## 2022-02-13 MED ORDER — HYDROCODONE-ACETAMINOPHEN 7.5-325 MG PO TABS: 1.0000 | ORAL_TABLET | ORAL | Status: DC | PRN

## 2022-02-13 MED ORDER — HYDROCHLOROTHIAZIDE 25 MG PO TABS
25.0000 mg | ORAL_TABLET | Freq: Every day | ORAL | Status: DC
  Filled 2022-02-13: qty 1

## 2022-02-13 MED ORDER — DEXAMETHASONE SODIUM PHOSPHATE 10 MG/ML IJ SOLN
INTRAMUSCULAR | Status: AC
  Filled 2022-02-13: qty 1

## 2022-02-13 MED ORDER — ONDANSETRON HCL 4 MG/2ML IJ SOLN
INTRAMUSCULAR | Status: AC
  Filled 2022-02-13: qty 2

## 2022-02-13 MED ORDER — TRANEXAMIC ACID 1000 MG/10ML IV SOLN
INTRAVENOUS | Status: AC
  Filled 2022-02-13: qty 10

## 2022-02-13 MED ORDER — INSULIN ASPART 100 UNIT/ML IJ SOLN
0.0000 [IU] | Freq: Three times a day (TID) | INTRAMUSCULAR | Status: DC
  Administered 2022-02-14: 5 [IU] via SUBCUTANEOUS

## 2022-02-13 MED ORDER — CLOBETASOL PROPIONATE 0.05 % EX SOLN: 1.0000 "application " | Freq: Two times a day (BID) | CUTANEOUS | Status: DC | PRN

## 2022-02-13 MED ORDER — PROPOFOL 10 MG/ML IV BOLUS
INTRAVENOUS | Status: DC | PRN
  Administered 2022-02-13: 100 ug/kg/min via INTRAVENOUS

## 2022-02-13 MED ORDER — PHENOL 1.4 % MT LIQD
1.0000 | OROMUCOSAL | Status: DC | PRN
  Filled 2022-02-13: qty 177

## 2022-02-13 MED ORDER — BUPIVACAINE HCL (PF) 0.5 % IJ SOLN
INTRAMUSCULAR | Status: DC | PRN
  Administered 2022-02-13: 2.5 mL via INTRATHECAL

## 2022-02-13 MED ORDER — METHOCARBAMOL 500 MG PO TABS
500.0000 mg | ORAL_TABLET | Freq: Four times a day (QID) | ORAL | Status: DC | PRN
Start: 2022-02-13 — End: 2022-02-14

## 2022-02-13 MED ORDER — VENLAFAXINE HCL ER 75 MG PO CP24
75.0000 mg | ORAL_CAPSULE | Freq: Every day | ORAL | Status: DC
  Administered 2022-02-14: 75 mg via ORAL
  Filled 2022-02-13: qty 1

## 2022-02-13 MED ORDER — DOCUSATE SODIUM 100 MG PO CAPS
100.0000 mg | ORAL_CAPSULE | Freq: Two times a day (BID) | ORAL | Status: DC
  Administered 2022-02-13 – 2022-02-14 (×2): 100 mg via ORAL

## 2022-02-13 MED ORDER — GLIPIZIDE ER 10 MG PO TB24
10.0000 mg | ORAL_TABLET | Freq: Every day | ORAL | Status: DC
Start: 2022-02-14 — End: 2022-02-14
  Administered 2022-02-14: 10 mg via ORAL
  Filled 2022-02-13: qty 1

## 2022-02-13 MED ORDER — TRANEXAMIC ACID-NACL 1000-0.7 MG/100ML-% IV SOLN
INTRAVENOUS | Status: DC | PRN
  Administered 2022-02-13: 1000 mg via INTRAVENOUS

## 2022-02-13 MED ORDER — MORPHINE SULFATE (PF) 4 MG/ML IV SOLN: 0.5000 mg | INTRAVENOUS | Status: DC | PRN

## 2022-02-13 MED ORDER — POLYETHYLENE GLYCOL 3350 17 G PO PACK
17.0000 g | PACK | Freq: Every day | ORAL | Status: DC | PRN
  Filled 2022-02-13: qty 1

## 2022-02-13 MED ORDER — METOPROLOL SUCCINATE ER 25 MG PO TB24
50.0000 mg | ORAL_TABLET | Freq: Every day | ORAL | Status: DC
  Administered 2022-02-14: 50 mg via ORAL
  Filled 2022-02-13: qty 2

## 2022-02-13 MED ORDER — DOCUSATE SODIUM 100 MG PO CAPS
ORAL_CAPSULE | ORAL | Status: AC
  Filled 2022-02-13: qty 1

## 2022-02-13 SURGICAL SUPPLY — 72 items
BLADE SAGITTAL 25.0X1.19X90 (BLADE) ×2 IMPLANT
BLADE SAW 90X13X1.19 OSCILLAT (BLADE) ×2 IMPLANT
BLOCK CUTTING FEMUR 3 RT MED (MISCELLANEOUS) ×1 IMPLANT
BLOCK CUTTING TIBIAL 3 RT (MISCELLANEOUS) ×1 IMPLANT
BLOCK CUTTING TIBIAL 4 RT MIS (MISCELLANEOUS) ×1 IMPLANT
BNDG ELASTIC 6X5.8 VLCR STR LF (GAUZE/BANDAGES/DRESSINGS) ×2 IMPLANT
BOWL CEMENT MIXING ADV NOZZLE (MISCELLANEOUS) ×1 IMPLANT
CANISTER WOUND CARE 500ML ATS (WOUND CARE) ×2 IMPLANT
CEMENT FEMORAL COMP SZ3 RT (Femur) ×1 IMPLANT
CEMENT HV SMART SET (Cement) ×4 IMPLANT
CHLORAPREP W/TINT 26 (MISCELLANEOUS) ×4 IMPLANT
COOLER POLAR GLACIER W/PUMP (MISCELLANEOUS) ×2 IMPLANT
CUFF TOURN SGL QUICK 24 (TOURNIQUET CUFF)
CUFF TOURN SGL QUICK 34 (TOURNIQUET CUFF)
CUFF TRNQT CYL 24X4X16.5-23 (TOURNIQUET CUFF) IMPLANT
CUFF TRNQT CYL 34X4.125X (TOURNIQUET CUFF) IMPLANT
DRAPE 3/4 80X56 (DRAPES) ×4 IMPLANT
DRSG MEPILEX SACRM 8.7X9.8 (GAUZE/BANDAGES/DRESSINGS) ×2 IMPLANT
ELECT CAUTERY BLADE 6.4 (BLADE) ×2 IMPLANT
ELECT REM PT RETURN 9FT ADLT (ELECTROSURGICAL) ×2
ELECTRODE REM PT RTRN 9FT ADLT (ELECTROSURGICAL) ×1 IMPLANT
FEMUR BONE MODEL (MISCELLANEOUS) ×1 IMPLANT
GAUZE SPONGE 4X4 12PLY STRL (GAUZE/BANDAGES/DRESSINGS) ×2 IMPLANT
GAUZE XEROFORM 1X8 LF (GAUZE/BANDAGES/DRESSINGS) ×1 IMPLANT
GLOVE SURG ORTHO LTX SZ8 (GLOVE) ×2 IMPLANT
GLOVE SURG SYN 9.0  PF PI (GLOVE) ×1
GLOVE SURG SYN 9.0 PF PI (GLOVE) ×1 IMPLANT
GLOVE SURG UNDER LTX SZ8 (GLOVE) ×2 IMPLANT
GLOVE SURG UNDER POLY LF SZ9 (GLOVE) ×2 IMPLANT
GOWN SRG 2XL LVL 4 RGLN SLV (GOWNS) ×1 IMPLANT
GOWN STRL NON-REIN 2XL LVL4 (GOWNS) ×1
GOWN STRL REUS W/ TWL LRG LVL3 (GOWN DISPOSABLE) ×1 IMPLANT
GOWN STRL REUS W/ TWL XL LVL3 (GOWN DISPOSABLE) ×1 IMPLANT
GOWN STRL REUS W/TWL LRG LVL3 (GOWN DISPOSABLE) ×1
GOWN STRL REUS W/TWL XL LVL3 (GOWN DISPOSABLE) ×1
HOLDER FOLEY CATH W/STRAP (MISCELLANEOUS) ×2 IMPLANT
INSERT TIBIAL SZ 3 HT 10 R (Insert) ×1 IMPLANT
IV NS IRRIG 3000ML ARTHROMATIC (IV SOLUTION) ×2 IMPLANT
KIT PREVENA INCISION MGT20CM45 (CANNISTER) ×2 IMPLANT
KIT TURNOVER KIT A (KITS) ×2 IMPLANT
MANIFOLD NEPTUNE II (INSTRUMENTS) ×3 IMPLANT
NDL SAFETY ECLIPSE 18X1.5 (NEEDLE) ×1 IMPLANT
NDL SPNL 18GX3.5 QUINCKE PK (NEEDLE) ×1 IMPLANT
NDL SPNL 20GX3.5 QUINCKE YW (NEEDLE) ×1 IMPLANT
NEEDLE HYPO 18GX1.5 SHARP (NEEDLE) ×1
NEEDLE SPNL 18GX3.5 QUINCKE PK (NEEDLE) ×2 IMPLANT
NEEDLE SPNL 20GX3.5 QUINCKE YW (NEEDLE) ×2 IMPLANT
NS IRRIG 1000ML POUR BTL (IV SOLUTION) ×2 IMPLANT
PACK TOTAL KNEE (MISCELLANEOUS) ×2 IMPLANT
PAD WRAPON POLAR KNEE (MISCELLANEOUS) ×1 IMPLANT
PATELLA RESURFACING MEDACTA 02 (Bone Implant) ×1 IMPLANT
PENCIL SMOKE EVACUATOR COATED (MISCELLANEOUS) ×2 IMPLANT
PULSAVAC PLUS IRRIG FAN TIP (DISPOSABLE) ×2
SCALPEL PROTECTED #10 DISP (BLADE) ×4 IMPLANT
SOLUTION PRONTOSAN WOUND 350ML (IRRIGATION / IRRIGATOR) ×2 IMPLANT
STAPLER SKIN PROX 35W (STAPLE) ×2 IMPLANT
STEM EXTENSION 11MMX30MM (Stem) ×1 IMPLANT
SUCTION FRAZIER HANDLE 10FR (MISCELLANEOUS) ×1
SUCTION TUBE FRAZIER 10FR DISP (MISCELLANEOUS) ×1 IMPLANT
SUT DVC 2 QUILL PDO  T11 36X36 (SUTURE) ×1
SUT DVC 2 QUILL PDO T11 36X36 (SUTURE) ×1 IMPLANT
SUT ETHIBOND 2 V 37 (SUTURE) IMPLANT
SUT V-LOC 90 ABS DVC 3-0 CL (SUTURE) ×2 IMPLANT
SYR 20ML LL LF (SYRINGE) ×2 IMPLANT
SYR 50ML LL SCALE MARK (SYRINGE) ×4 IMPLANT
TIP FAN IRRIG PULSAVAC PLUS (DISPOSABLE) ×1 IMPLANT
TOWEL OR 17X26 4PK STRL BLUE (TOWEL DISPOSABLE) ×1 IMPLANT
TOWER CARTRIDGE SMART MIX (DISPOSABLE) ×1 IMPLANT
TRAY FOLEY MTR SLVR 16FR STAT (SET/KITS/TRAYS/PACK) ×2 IMPLANT
TRAY TIB FX RT SZ 3 (Joint) ×1 IMPLANT
WATER STERILE IRR 1000ML POUR (IV SOLUTION) ×3 IMPLANT
WRAPON POLAR PAD KNEE (MISCELLANEOUS) ×2

## 2022-02-13 NOTE — Transfer of Care (Signed)
Immediate Anesthesia Transfer of Care Note  Patient: Diana Deleon  Procedure(s) Performed: TOTAL KNEE ARTHROPLASTY (Right: Knee)  Patient Location: PACU  Anesthesia Type:Spinal  Level of Consciousness: awake  Airway & Oxygen Therapy: Patient Spontanous Breathing  Post-op Assessment: Report given to RN and Post -op Vital signs reviewed and stable  Post vital signs: Reviewed and stable  Last Vitals:  Vitals Value Taken Time  BP 108/64 02/13/22 1230  Temp 97.8   Pulse 74   Resp 15 02/13/22 1232  SpO2 96%   Vitals shown include unvalidated device data.  Last Pain:  Vitals:   02/13/22 0814  TempSrc: Temporal  PainSc: 6          Complications: No notable events documented.

## 2022-02-13 NOTE — Anesthesia Procedure Notes (Signed)
Spinal  Patient location during procedure: OR Start time: 02/13/2022 10:38 AM End time: 02/13/2022 10:43 AM Reason for block: surgical anesthesia Staffing Performed: resident/CRNA  Resident/CRNA: Cammie Sickle, CRNA Preanesthetic Checklist Completed: patient identified, IV checked, site marked, risks and benefits discussed, surgical consent, monitors and equipment checked, pre-op evaluation and timeout performed Spinal Block Patient position: sitting Prep: Betadine Patient monitoring: heart rate, continuous pulse ox, blood pressure and cardiac monitor Approach: midline Location: L4-5 Injection technique: single-shot Needle Needle type: Whitacre and Introducer  Needle gauge: 24 G Needle length: 9 cm Assessment Sensory level: T4 Events: CSF return Additional Notes Negative paresthesia. Negative blood return. Positive free-flowing CSF. Expiration date of kit checked and confirmed. Patient tolerated procedure well, without complications.

## 2022-02-13 NOTE — Op Note (Signed)
02/13/2022  12:29 PM  PATIENT:  Diana Deleon   MRN: [id number]  PRE-OPERATIVE DIAGNOSIS:  Primary localized osteoarthritis of right knee   POST-OPERATIVE DIAGNOSIS:  Same   PROCEDURE:  Procedure(s): Right TOTAL KNEE ARTHROPLASTY   SURGEON: Laurene Footman, MD   ASSISTANTS: Rachelle Hora, PA-C   ANESTHESIA:   spinal   EBL: 150   BLOOD ADMINISTERED:none   DRAINS:  Incisional wound VAC     LOCAL MEDICATIONS USED:  MARCAINE    and OTHER Exparel morphine   SPECIMEN:  No Specimen   DISPOSITION OF SPECIMEN:  N/A   COUNTS:  YES   TOURNIQUET: 22 minutes at 300 mm Hg   IMPLANTS: Medacta  GMK sphere system with 3 right femur, 3 right tibia with short stem and 10 mm insert.  Size 2 patella, all components cemented.   DICTATION: Viviann Spare Dictation   patient was brought to the operating room and spinal anesthesia was obtained.  After prepping and draping the right leg in sterile fashion, and after patient identification and timeout procedures were completed, tourniquet was not raised until after all bone cuts had been made and prior to cement Tatian, and midline skin incision was made followed by medial parapatellar arthrotomy with severe medial compartment osteoarthritis, severe patellofemoral arthritis and moderate lateral compartment arthritis, partial synovectomy was also carried out.   The ACL and PCL and fat pad were excised along with anterior horns of the meniscus. The proximal tibia cutting guide from  the [facility] system was applied and the proximal tibia cut carried out.  The distal femoral cut was carried out in a similar fashion     The 3 femoral cutting guide applied with anterior posterior and chamfer cuts made.  The posterior horns of the menisci were removed at this point.   Injection of the above medication was carried out after the femoral and tibial cuts were carried out.  The 3 baseplate trial was placed pinned into position and proximal tibial preparation carried out with  drilling hand reaming and the keel punch followed by placement of the 3 femur and sizing the tibial insert size 10 millimeter gave the best fit with stability and full extension.  The distal femoral drill holes were made in the notch cut for the trochlear groove was then carried out with trials were then removed the patella was cut using the patellar cutting guide and it sized to a size 2 after drill holes have been made  The knee was irrigated with pulsatile lavage and the bony surfaces dried the tibial component was cemented into place first.  Excess cement was removed and the polyethylene insert placed with a torque screw placed with a torque screwdriver tightened.  The distal femoral component was placed and the knee was held in extension as the patellar button was clamped into place.  After the cement was set, excess cement was removed and the knee was again irrigated thoroughly thoroughly irrigated.  The tourniquet was let down and hemostasis checked with electrocautery. The arthrotomy was repaired with a heavy Quill suture,  followed by 3-0 V lock subcuticular closure, skin staples followed by incisional wound VAC and Polar Care.[name] Kitchen   PLAN OF CARE: Admit for overnight observation   PATIENT DISPOSITION:  PACU - hemodynamically stable.

## 2022-02-13 NOTE — Anesthesia Postprocedure Evaluation (Signed)
Anesthesia Post Note  Patient: Diana Deleon  Procedure(s) Performed: TOTAL KNEE ARTHROPLASTY (Right: Knee)  Patient location during evaluation: PACU Anesthesia Type: Spinal and General Level of consciousness: awake and alert, oriented and patient cooperative Pain management: pain level controlled Vital Signs Assessment: post-procedure vital signs reviewed and stable Respiratory status: spontaneous breathing, nonlabored ventilation and respiratory function stable Cardiovascular status: blood pressure returned to baseline and stable Postop Assessment: adequate PO intake Anesthetic complications: no   No notable events documented.   Last Vitals:  Vitals:   02/13/22 1316 02/13/22 1331  BP: 116/72 115/69  Pulse: 63 62  Resp: 12 12  Temp:    SpO2: 98% 96%    Last Pain:  Vitals:   02/13/22 1331  TempSrc:   PainSc: Asleep                 Darrin Nipper

## 2022-02-13 NOTE — H&P (Signed)
Chief Complaint: No chief complaint on file.  Diana Deleon is a 61 y.o. female who presents today for history and physical for right total knee arthroplasty with Dr. Hessie Knows on 02/13/2022. Patient has advanced right knee osteoarthritis with complete loss of joint space in the medial compartment with sclerotic changes along the medial tibial plateau as well as spurring along the lateral femoral condyle and lateral tibial plateau. She has moderate varus deformity. Pain is severe located along the medial joint line. She describes the pain as aching and increased with any type of weightbearing activity. Her knee will buckle and give way and this is becoming more frequently over the last few months. Patient has had a successful left total knee arthroplasty in 2022. Patient has tried conservative treatment for right knee with cortisone injections, meloxicam with little to no relief. Last cortisone injection was in June 2022 with no movement.  Past Medical History: Past Medical History:  Diagnosis Date   Breast cancer (CMS-HCC)  right   COPD (chronic obstructive pulmonary disease) (CMS-HCC)   Diabetes mellitus without complication (CMS-HCC)   Hypertension   Osteoarthritis   Psoriasis   Thyroid disease   Past Surgical History: Past Surgical History:  Procedure Laterality Date   CHOLECYSTECTOMY 2003   ARTHROPLASTY TOTAL KNEE Left 06/21/2020  Dr. Rudene Christians   BREAST SURGERY   HYSTERECTOMY   MASTECTOMY   TUBAL LIGATION   Past Family History: Family History  Problem Relation Age of Onset   Stroke Mother   High blood pressure (Hypertension) Mother   Alcohol abuse Father   COPD Father   High blood pressure (Hypertension) Father   Medications: Current Outpatient Medications Ordered in Epic  Medication Sig Dispense Refill   albuterol (VENTOLIN HFA) 90 mcg/actuation inhaler Ventolin HFA 90 mcg/actuation aerosol inhaler INL 2 PFS ITL Q 4 H PRF WHZ OR SOB OR COUGH   alendronate (FOSAMAX) 70 MG  tablet alendronate 70 mg tablet TK 1 T PO Q 7 DAYS WITH A FULL GLASS OF WATER OES   anastrozole (ARIMIDEX) 1 mg tablet TAKE 1 TABLET (1 MG TOTAL) BY MOUTH DAILY.   aspirin 81 MG EC tablet Take 81 mg by mouth once daily.   betamethasone dipropionate, augmented, (DIPROLENE) 0.05 % ointment Apply topically 2 (two) times daily. Do not exceed 2 weeks of treatment or 45 g/week.   calcium carbonate-vitamin D3 600 mg(1,500mg ) -800 unit Tab Take by mouth   clobetasoL (CORMAX) 0.05 % external solution APP AA ON SCALP BID UNTIL CLEAR   glipiZIDE (GLUCOTROL XL) 10 MG XL tablet   HYDROcodone-acetaminophen (NORCO) 7.5-325 mg tablet Take by mouth   insulin DETEMIR (LEVEMIR FLEXTOUCH U-100 INSULN) pen injector (concentration 100 units/mL) Levemir FlexTouch U-100 Insulin 100 unit/mL (3 mL) subcutaneous pen INJECT 30 UNITS INTO THE SKIN DAILY.   ketoconazole (NIZORAL) 2 % shampoo   levothyroxine (SYNTHROID) 50 MCG tablet levothyroxine 50 mcg tablet   losartan-hydrochlorothiazide (HYZAAR) 100-25 mg tablet losartan 100 mg-hydrochlorothiazide 25 mg tablet   meloxicam (MOBIC) 7.5 MG tablet TAKE 1 TABLET BY MOUTH EVERY DAY 30 tablet 0   methocarbamoL (ROBAXIN) 500 MG tablet Take by mouth   metoprolol succinate (TOPROL-XL) 50 MG XL tablet TK 1 T PO QD WITH OR IMMEDIATELY FOLLOWING A MEAL   omeprazole (PRILOSEC OTC) 20 MG tablet Take 20 mg by mouth once daily.   rosuvastatin (CRESTOR) 5 MG tablet   SKYRIZI 150 mg/mL PnIj Inject subcutaneously   traMADoL (ULTRAM) 50 mg tablet Take by mouth   valACYclovir (VALTREX)  500 MG tablet Take by mouth Take 1 tablet (500 mg total) by mouth 2 (two) times daily. For 5 days for an outbreak.   venlafaxine (EFFEXOR) 75 MG tablet Take by mouth.   No current Epic-ordered facility-administered medications on file.   Allergies: Allergies  Allergen Reactions   Amlodipine Swelling   Metformin Other (See Comments) and Diarrhea  Diarrhea (even XR)    Review of Systems:  A  comprehensive 14 point ROS was performed, reviewed by me today, and the pertinent orthopaedic findings are documented in the HPI.  Exam: LMP (LMP Unknown)  General/Constitutional: The patient appears to be well-nourished, well-developed, and in no acute distress. Neuro/Psych: Normal mood and affect, oriented to person, place and time. Eyes: Non-icteric. Pupils are equal, round, and reactive to light, and exhibit synchronous movement. ENT: Unremarkable. Lymphatic: No palpable adenopathy. Respiratory: Non-labored breathing Cardiovascular: No edema, swelling or tenderness, except as noted in detailed exam. Integumentary: No impressive skin lesions present, except as noted in detailed exam. Musculoskeletal: Unremarkable, except as noted in detailed exam.  General:  Well developed, well nourished, no apparent distress, normal affect, antalgic gait with no assist devices.  HEENT: Head normocephalic, atraumatic, PERRL.   Abdomen: Soft, non tender, non distended, Bowel sounds present.  Heart: Examination of the heart reveals regular, rate, and rhythm. There is no murmur noted on ascultation. There is a normal apical pulse.  Lungs: Lungs are clear to auscultation. There is no wheeze, rhonchi, or crackles. There is normal expansion of bilateral chest walls.   Right lower Extremities: Examination of the right lower extremity reveals no bony abnormality, no edema, no effusion and no ecchymosis. There is no valgus or varus abnormality. The patient is non-tender along the lateral joint line, and is minimally tender along the medial joint line. 0 to 115 degrees range of motion. There is no retropatellar discomfort. The patient has a negative patella stretch test. The patient has a negative varus stress test and a negative valgus stress test, in looking for stability. The patient has a negative Lachman's test.  Vascular: The patient has a negative Bevelyn Buckles' test bilaterally. The patient had a normal  dorsalis pedis and posterior tibial pulse. There is normal skin warmth. There is normal capillary refill bilaterally.   Neurologic: The patient has a negative straight leg raise. The patient has normal muscle strength testing for the quadriceps, calves, ankle dorsiflexion, ankle plantarflexion, and extensor hallicus longus. The patient has sensation that is intact to light touch. The deep tendon reflexes are normal at the patella and achilles. No clonus is noted. .  AP lateral sunrise views of the right knee are reviewed by me in the office today. Impression: Patient has complete loss of joint space in the medial compartment of the right knee with severe subchondral sclerosing. Slight varus deformity with spurring along the lateral femoral condyle and lateral tibial plateau. Advanced patellofemoral osteoarthritis with spurring and subchondral changes. Patella tracks well in the trochlear groove. Patient with history of left total knee arthroplasty with no signs of loosening, subsidence or periprosthetic fracture.  Impression: Primary osteoarthritis of right knee [M17.11] Primary osteoarthritis of right knee (primary encounter diagnosis)  Plan:  29. 61 year old female with severe right knee osteoarthritis with complete loss of joint space in the medial compartment with tricompartmental arthritic changes and varus deformity. She has had years of pain, no relief with conservative treatment. Pain is severe knee is frequently buckling and giving away. Risks, benefits, complications of a right total knee arthroplasty have  been discussed with the patient. Patient has agreed and consented procedure with Dr. Hessie Knows on 02/13/2022.  This note was generated in part with voice recognition software and I apologize for any typographical errors that were not detected and corrected.  Nunzio Cobbs Gaines MPA-C    Reviewed  H+P. No changes noted.

## 2022-02-13 NOTE — Anesthesia Preprocedure Evaluation (Addendum)
Anesthesia Evaluation  Patient identified by MRN, date of birth, ID band Patient awake    Reviewed: Allergy & Precautions, NPO status , Patient's Chart, lab work & pertinent test results  History of Anesthesia Complications Negative for: history of anesthetic complications  Airway Mallampati: I   Neck ROM: Full    Dental  (+)    Pulmonary neg pulmonary ROS,    Pulmonary exam normal breath sounds clear to auscultation       Cardiovascular hypertension, Normal cardiovascular exam Rhythm:Regular Rate:Normal  ECG 01/30/22: normal   Neuro/Psych negative neurological ROS     GI/Hepatic GERD  ,  Endo/Other  diabetes, Type 2, Insulin DependentHypothyroidism Obesity   Renal/GU Renal disease (nephrolithiasis)     Musculoskeletal  (+) Arthritis ,   Abdominal   Peds  Hematology Breast CA   Anesthesia Other Findings   Reproductive/Obstetrics                            Anesthesia Physical Anesthesia Plan  ASA: 3  Anesthesia Plan: General and Spinal   Post-op Pain Management:    Induction: Intravenous  PONV Risk Score and Plan: 3 and Propofol infusion, TIVA, Treatment may vary due to age or medical condition and Ondansetron  Airway Management Planned: Natural Airway and Nasal Cannula  Additional Equipment:   Intra-op Plan:   Post-operative Plan:   Informed Consent: I have reviewed the patients History and Physical, chart, labs and discussed the procedure including the risks, benefits and alternatives for the proposed anesthesia with the patient or authorized representative who has indicated his/her understanding and acceptance.       Plan Discussed with: CRNA  Anesthesia Plan Comments: (Plan for spinal and GA with natural airway, LMA/GETA backup.  Patient consented for risks of anesthesia including but not limited to:  - adverse reactions to medications - damage to eyes, teeth, lips  or other oral mucosa - nerve damage due to positioning  - sore throat or hoarseness - headache, bleeding, infection, nerve damage 2/2 spinal - damage to heart, brain, nerves, lungs, other parts of body or loss of life  Informed patient about role of CRNA in peri- and intra-operative care.  Patient voiced understanding.)        Anesthesia Quick Evaluation

## 2022-02-13 NOTE — Progress Notes (Signed)
PT Cancellation Note  Patient Details Name: Diana Deleon MRN: [id number] DOB: November 17, 1961   Cancelled Treatment:    Reason Eval/Treat Not Completed: Other (comment). Orders received and chart reviewed. Pt alert but still unable to move RLE due to spinal anesthesia. Will re-attempt at later time/date as appropriate to safely mobilize pt.   Salem Caster. Fairly IV, PT, DPT Physical Therapist- [facility]  02/13/2022, 2:54 PM

## 2022-02-14 DIAGNOSIS — Z7984 Long term (current) use of oral hypoglycemic drugs: Secondary | ICD-10-CM | POA: Diagnosis not present

## 2022-02-14 DIAGNOSIS — Z794 Long term (current) use of insulin: Secondary | ICD-10-CM | POA: Diagnosis not present

## 2022-02-14 DIAGNOSIS — Z853 Personal history of malignant neoplasm of breast: Secondary | ICD-10-CM | POA: Diagnosis not present

## 2022-02-14 DIAGNOSIS — Z7982 Long term (current) use of aspirin: Secondary | ICD-10-CM | POA: Diagnosis not present

## 2022-02-14 DIAGNOSIS — M1711 Unilateral primary osteoarthritis, right knee: Secondary | ICD-10-CM | POA: Diagnosis not present

## 2022-02-14 DIAGNOSIS — I10 Essential (primary) hypertension: Secondary | ICD-10-CM | POA: Diagnosis not present

## 2022-02-14 DIAGNOSIS — Z96652 Presence of left artificial knee joint: Secondary | ICD-10-CM | POA: Diagnosis not present

## 2022-02-14 DIAGNOSIS — J449 Chronic obstructive pulmonary disease, unspecified: Secondary | ICD-10-CM | POA: Diagnosis not present

## 2022-02-14 DIAGNOSIS — Z79899 Other long term (current) drug therapy: Secondary | ICD-10-CM | POA: Diagnosis not present

## 2022-02-14 DIAGNOSIS — E1165 Type 2 diabetes mellitus with hyperglycemia: Secondary | ICD-10-CM | POA: Diagnosis not present

## 2022-02-14 LAB — GLUCOSE, CAPILLARY
Glucose-Capillary: 219 mg/dL — ABNORMAL HIGH (ref 70–99)
Glucose-Capillary: 225 mg/dL — ABNORMAL HIGH (ref 70–99)

## 2022-02-14 LAB — CBC
HCT: 36 % (ref 36.0–46.0)
Hemoglobin: 12.3 g/dL (ref 12.0–15.0)
MCH: 30.3 pg (ref 26.0–34.0)
MCHC: 34.2 g/dL (ref 30.0–36.0)
MCV: 88.7 fL (ref 80.0–100.0)
Platelets: 222 10*3/uL (ref 150–400)
RBC: 4.06 MIL/uL (ref 3.87–5.11)
RDW: 12.7 % (ref 11.5–15.5)
WBC: 12.5 10*3/uL — ABNORMAL HIGH (ref 4.0–10.5)
nRBC: 0 % (ref 0.0–0.2)

## 2022-02-14 LAB — BASIC METABOLIC PANEL
Anion gap: 9 (ref 5–15)
BUN: 16 mg/dL (ref 6–20)
CO2: 27 mmol/L (ref 22–32)
Calcium: 8.2 mg/dL — ABNORMAL LOW (ref 8.9–10.3)
Chloride: 101 mmol/L (ref 98–111)
Creatinine, Ser: 1.03 mg/dL — ABNORMAL HIGH (ref 0.44–1.00)
GFR, Estimated: >60 mL/min (ref >60)
Glucose, Bld: 220 mg/dL — ABNORMAL HIGH (ref 70–99)
Potassium: 3.4 mmol/L — ABNORMAL LOW (ref 3.5–5.1)
Sodium: 137 mmol/L (ref 135–145)

## 2022-02-14 MED ORDER — ASPIRIN 81 MG PO CHEW
CHEWABLE_TABLET | ORAL | Status: AC
  Filled 2022-02-14: qty 1

## 2022-02-14 MED ORDER — PANTOPRAZOLE SODIUM 40 MG PO TBEC
DELAYED_RELEASE_TABLET | ORAL | Status: AC
  Filled 2022-02-14: qty 1

## 2022-02-14 MED ORDER — POTASSIUM CHLORIDE 20 MEQ PO PACK
20.0000 meq | PACK | Freq: Two times a day (BID) | ORAL | Status: DC
  Administered 2022-02-14: 20 meq via ORAL
  Filled 2022-02-14 (×2): qty 1

## 2022-02-14 MED ORDER — TRAMADOL HCL 50 MG PO TABS
ORAL_TABLET | ORAL | Status: AC
  Administered 2022-02-14: 50 mg via ORAL
  Filled 2022-02-14: qty 1

## 2022-02-14 MED ORDER — TRAMADOL HCL 50 MG PO TABS
ORAL_TABLET | ORAL | Status: AC
  Filled 2022-02-14: qty 1

## 2022-02-14 MED ORDER — LOSARTAN POTASSIUM 50 MG PO TABS: 100.0000 mg | ORAL_TABLET | Freq: Every day | ORAL | Status: DC

## 2022-02-14 MED ORDER — DOCUSATE SODIUM 100 MG PO CAPS
ORAL_CAPSULE | ORAL | Status: AC
Start: 2022-02-14 — End: 2022-02-14
  Filled 2022-02-14: qty 1

## 2022-02-14 MED ORDER — INSULIN ASPART 100 UNIT/ML IJ SOLN
INTRAMUSCULAR | Status: AC
  Filled 2022-02-14: qty 1

## 2022-02-14 MED ORDER — HYDROCODONE-ACETAMINOPHEN 7.5-325 MG PO TABS: 1.0000 | ORAL_TABLET | ORAL | 0 refills | Status: DC | PRN

## 2022-02-14 MED ORDER — INSULIN ASPART 100 UNIT/ML IJ SOLN
INTRAMUSCULAR | Status: AC
  Administered 2022-02-14: 5 [IU] via SUBCUTANEOUS
  Filled 2022-02-14: qty 1

## 2022-02-14 MED ORDER — METHOCARBAMOL 500 MG PO TABS: 500.0000 mg | ORAL_TABLET | Freq: Four times a day (QID) | ORAL | 0 refills | Status: DC | PRN

## 2022-02-14 MED ORDER — HYDROCODONE-ACETAMINOPHEN 5-325 MG PO TABS
ORAL_TABLET | ORAL | Status: AC
  Administered 2022-02-14: 2 via ORAL
  Filled 2022-02-14: qty 2

## 2022-02-14 MED ORDER — ENOXAPARIN SODIUM 40 MG/0.4ML IJ SOSY: 40.0000 mg | PREFILLED_SYRINGE | INTRAMUSCULAR | 0 refills | Status: DC

## 2022-02-14 MED ORDER — HYDROCODONE-ACETAMINOPHEN 5-325 MG PO TABS
ORAL_TABLET | ORAL | Status: AC
  Filled 2022-02-14: qty 2

## 2022-02-14 MED ORDER — TRAMADOL HCL 50 MG PO TABS: 50.0000 mg | ORAL_TABLET | Freq: Four times a day (QID) | ORAL | 0 refills | Status: DC

## 2022-02-14 MED ORDER — HYDROCHLOROTHIAZIDE 25 MG PO TABS
25.0000 mg | ORAL_TABLET | Freq: Every day | ORAL | Status: DC
  Administered 2022-02-14: 25 mg via ORAL
  Filled 2022-02-14: qty 1

## 2022-02-14 MED ORDER — ENOXAPARIN SODIUM 30 MG/0.3ML IJ SOSY
PREFILLED_SYRINGE | INTRAMUSCULAR | Status: AC
  Administered 2022-02-14: 30 mg via SUBCUTANEOUS
  Filled 2022-02-14: qty 0.3

## 2022-02-14 MED ORDER — POLYETHYLENE GLYCOL 3350 17 G PO PACK: 17.0000 g | PACK | Freq: Every day | ORAL | 0 refills | Status: DC | PRN

## 2022-02-14 MED ORDER — DOCUSATE SODIUM 100 MG PO CAPS: 100.0000 mg | ORAL_CAPSULE | Freq: Two times a day (BID) | ORAL | 0 refills | Status: DC

## 2022-02-14 NOTE — Discharge Summary (Signed)
Physician Discharge Summary  Patient ID: Diana Deleon MRN: [id number] DOB/AGE: Dec 21, 1960 61 y.o.  Admit date: 02/13/2022 Discharge date: 02/14/2022  Admission Diagnoses:  S/P TKR (total knee replacement) using cement, right [Z96.651]   Discharge Diagnoses: Patient Active Problem List   Diagnosis Date Noted   S/P TKR (total knee replacement) using cement, right 02/13/2022   Abnormal urine odor 09/13/2021   Neck pain 09/13/2021   Dysphagia 09/13/2021   Blister of foot 06/12/2021   Current use of proton pump inhibitor 05/09/2021   Foot pain, bilateral 11/03/2020   S/P TKR (total knee replacement) using cement, left 06/21/2020   Osteoarthritis 04/28/2020   Hyperlipidemia associated with type 2 diabetes mellitus ([place]) 11/02/2018   Low back pain 03/21/2018   Osteopenia 01/05/2018   Aromatase inhibitor use 12/25/2017   Arthralgia 07/03/2017   Chronic pain of left knee 07/03/2017   Class 2 severe obesity due to excess calories with serious comorbidity and body mass index (BMI) of 37.0 to 37.9 in adult [facility]) 07/03/2017   Trigger ring finger of right hand 07/03/2017   Psoriasis 11/02/2016   Colon cancer screening 11/02/2016   Class 2 severe obesity due to excess calories with serious comorbidity and body mass index (BMI) of 36.0 to 36.9 in adult ([place]) 10/31/2015   History of breast cancer 10/25/2014   Viral URI with cough 11/25/2013   Type 2 diabetes mellitus with hyperglycemia ([place]) 07/14/2013   Vaginal intraepithelial neoplasia grade 1 07/09/2013   Routine general medical examination at a health care facility 10/12/2012   Hypothyroidism 06/03/2012   Overactive bladder 05/27/2012   HSV infection 07/07/2010   URINARY URGENCY 07/15/2009   LOW BACK PAIN, CHRONIC 05/17/2008   HEADACHE, CHRONIC 05/17/2008   Essential hypertension 02/12/2008    Past Medical History:  Diagnosis Date   Anemia    "off and on"   Arthritis    "right toes" (03/24/2015)   Cancer of right breast ([place])  2015   Cyst of cystic duct    chest- central location, taking Cephalexin currently   Diabetes mellitus without complication ([place])    Fibroids    hyst. 2006   GERD (gastroesophageal reflux disease)    Headache    History of abnormal Pap smear    has had leep in past   History of cold sores    has used valtrex in past   History of kidney stones    History of recurrent UTIs    Hypertension    Hypothyroidism    Intermittent vertigo    Seborrheic dermatitis    local on R scalp and in ear (failed mult meds- uses steroid spray)   Thyroid disease      Transfusion: none   Consultants (if any):   Discharged Condition: Improved  Hospital Course: [name] is an 61 y.o. female who was admitted 02/13/2022 with a diagnosis of S/P TKR (total knee replacement) using cement, right and went to the operating room on 02/13/2022 and underwent the above named procedures.    Surgeries: Procedure(s): TOTAL KNEE ARTHROPLASTY on 02/13/2022 Patient tolerated the surgery well. Taken to PACU where she was stabilized and then transferred to the orthopedic floor.  Started on Lovenox 30 mg q 12 hrs. Foot pumps applied bilaterally at 80 mm. Heels elevated on bed with rolled towels. No evidence of DVT. Negative Homan. Physical therapy started on day #1 for gait training and transfer. OT started day #1 for ADL and assisted devices.  Patient's foley was d/c on day #1.  Patient's IV  was d/c on day #1.  On post op day #1patient was stable and ready for discharge to home with HHPT.   She was given perioperative antibiotics:  Anti-infectives (From admission, onward)    Start     Dose/Rate Route Frequency Ordered Stop   02/13/22 1700  ceFAZolin (ANCEF) IVPB 2g/100 mL premix        2 g 200 mL/hr over 30 Minutes Intravenous Every 6 hours 02/13/22 1550 02/14/22 0015   02/13/22 1553  ceFAZolin (ANCEF) 2-4 GM/100ML-% IVPB       Note to Pharmacy: Garfield Cornea M: cabinet override      02/13/22 1553 02/13/22 1555    02/13/22 0801  ceFAZolin (ANCEF) 2-4 GM/100ML-% IVPB       Note to Pharmacy: Maryagnes Amos B: cabinet override      02/13/22 0801 02/13/22 1104   02/13/22 0600  ceFAZolin (ANCEF) IVPB 2g/100 mL premix        2 g 200 mL/hr over 30 Minutes Intravenous On call to O.R. 02/12/22 2330 02/13/22 1104     .  She was given sequential compression devices, early ambulation, and Lovenox TEDs for DVT prophylaxis.  She benefited maximally from the hospital stay and there were no complications.    Recent vital signs:  Vitals:   02/14/22 0849 02/14/22 1130  BP: 120/71 134/81  Pulse: 79 62  Resp: 17 16  Temp: 98.1 F (36.7 C) 98.2 F (36.8 C)  SpO2: 97% 99%    Recent laboratory studies:  Lab Results  Component Value Date   HGB 12.3 02/14/2022   HGB 13.5 02/13/2022   HGB 15.0 01/30/2022   Lab Results  Component Value Date   WBC 12.5 (H) 02/14/2022   PLT 222 02/14/2022   No results found for: INR Lab Results  Component Value Date   NA 137 02/14/2022   K 3.4 (L) 02/14/2022   CL 101 02/14/2022   CO2 27 02/14/2022   BUN 16 02/14/2022   CREATININE 1.03 (H) 02/14/2022   GLUCOSE 220 (H) 02/14/2022    Discharge Medications:   Allergies as of 02/14/2022       Reactions   Metformin And Related Diarrhea   Norvasc [amlodipine Besylate] Swelling        Medication List     STOP taking these medications    aspirin EC 81 MG tablet   aspirin-acetaminophen-caffeine 250-250-65 MG tablet Commonly known as: EXCEDRIN MIGRAINE   meloxicam 7.5 MG tablet Commonly known as: MOBIC       TAKE these medications    albuterol 108 (90 Base) MCG/ACT inhaler Commonly known as: VENTOLIN HFA TAKE 2 PUFFS BY MOUTH EVERY 6 HOURS AS NEEDED FOR WHEEZE OR SHORTNESS OF BREATH What changed: See the new instructions.   alendronate 70 MG tablet Commonly known as: FOSAMAX TAKE 1 TABLET BY MOUTH EVERY 7 DAYS. TAKE WITH A FULL GLASS OF WATER ON AN EMPTY STOMACH. What changed: See the new  instructions.   betamethasone dipropionate 0.05 % ointment Commonly known as: DIPROLENE Apply 1 application topically 2 (two) times daily as needed (ears (psoriasis)).   Calcium Carb-Cholecalciferol 600-800 MG-UNIT Tabs Take 2 tablets by mouth every evening.   clobetasol 0.05 % external solution Commonly known as: TEMOVATE Apply 1 application topically 2 (two) times daily as needed (scalp psoriasis).   docusate sodium 100 MG capsule Commonly known as: COLACE Take 1 capsule (100 mg total) by mouth 2 (two) times daily.   enoxaparin 40 MG/0.4ML injection  Commonly known as: LOVENOX Inject 0.4 mLs (40 mg total) into the skin daily for 14 days.   glipiZIDE 10 MG 24 hr tablet Commonly known as: GLUCOTROL XL Take 1 tablet (10 mg total) by mouth daily with breakfast.   HYDROcodone-acetaminophen 7.5-325 MG tablet Commonly known as: NORCO Take 1-2 tablets by mouth every 4 (four) hours as needed for severe pain (pain score 7-10).   Levemir FlexTouch 100 UNIT/ML FlexPen Generic drug: insulin detemir INJECT 65 UNITS INTO SKIN AT BEDTIME   levothyroxine 50 MCG tablet Commonly known as: SYNTHROID Take 1 tablet (50 mcg total) by mouth daily before breakfast.   losartan-hydrochlorothiazide 100-25 MG tablet Commonly known as: HYZAAR Take 1 tablet by mouth daily.   methocarbamol 500 MG tablet Commonly known as: ROBAXIN Take 1 tablet (500 mg total) by mouth every 6 (six) hours as needed for muscle spasms.   metoprolol succinate 50 MG 24 hr tablet Commonly known as: TOPROL-XL TAKE 1 TABLET BY MOUTH DAILY. TAKE WITH OR IMMEDIATELY FOLLOWING A MEAL.   multivitamin with minerals Tabs tablet Take 1 tablet by mouth every evening.   omeprazole 20 MG capsule Commonly known as: PRILOSEC TAKE 1 CAPSULE BY MOUTH DAILY BEFORE BREAKFAST   OneTouch Delica Lancets Fine Misc Use to check blood sugar once daily and as needed (dx. E11.9)   OneTouch Verio test strip Generic drug: glucose  blood USE TO CHECK BLOOD SUGAR ONCE DAILY AND AS NEEDED   polyethylene glycol 17 g packet Commonly known as: MIRALAX / GLYCOLAX Take 17 g by mouth daily as needed for mild constipation.   rosuvastatin 5 MG tablet Commonly known as: CRESTOR TAKE 1 TABLET (5 MG TOTAL) BY MOUTH DAILY.   SKYRIZI (150 MG DOSE) [place] Inject 150 mcg into the skin every 3 (three) months. Take as directed (every 3 month)   traMADol 50 MG tablet Commonly known as: ULTRAM Take 1 tablet (50 mg total) by mouth every 6 (six) hours.   Trulicity 7.42 VZ/5.6LO Sopn Generic drug: Dulaglutide Inject 0.75 mg into the skin once a week. What changed: when to take this   valACYclovir 500 MG tablet Commonly known as: VALTREX TAKE 1 TABLET(500 MG) BY MOUTH TWICE DAILY FOR 5 DAYS FOR OUTBREAK   venlafaxine XR 75 MG 24 hr capsule Commonly known as: EFFEXOR-XR TAKE 1 CAPSULE(75 MG) BY MOUTH DAILY WITH BREAKFAST               Durable Medical Equipment  (From admission, onward)           Start     Ordered   02/13/22 1713  DME Walker rolling  Once       Question Answer Comment  Walker: With Martell Wheels   Patient needs a walker to treat with the following condition S/P TKR (total knee replacement) using cement, right      02/13/22 1712   02/13/22 1713  DME 3 n 1  Once        02/13/22 1712   02/13/22 1713  DME Bedside commode  Once       Question:  Patient needs a bedside commode to treat with the following condition  Answer:  S/P TKR (total knee replacement) using cement, right   02/13/22 1712            Diagnostic Studies: DG Knee 1-2 Views Right  Result Date: 02/13/2022 CLINICAL DATA:  Status post total knee replacement EXAM: RIGHT KNEE - 1-2 VIEW COMPARISON:  CT scan 01/02/2022 FINDINGS:  Interval right total knee prosthesis placement observed with expected alignment and appearance of the components of the prosthesis. Tibial component screw head visibility is normal for the Medacta GMK sphere  system utilized. No periprosthetic fracture or acute complicating feature observed. Expected gas in the joint noted. IMPRESSION: 1. Expected postoperative appearance of the [place] sphere system, without immediate complicating feature observed. Electronically Signed   By: Van Clines M.D.   On: 02/13/2022 13:21    Disposition:      Follow-up Information     Duanne Guess, PA-C Follow up in 2 week(s).   Specialties: Orthopedic Surgery, Emergency Medicine Contact information: [place] [address] [phone]                  Signed: Feliberto Gottron 02/14/2022, 11:56 AM

## 2022-02-14 NOTE — Evaluation (Signed)
Physical Therapy Evaluation ?Patient Details ?Name: Diana Deleon ?MRN: [id number] ?DOB: [birth year] ?Today's Date: 02/14/2022 ? ?History of Present Illness ? Maryela Tapper is s/p elective R TKA. PMH includes: history of breast cancer, COPD, HTN, DM, L TKA in 2021.  ?Clinical Impression ? Pt admitted with above diagnosis. Pt received supine in bed agreeable to PT services. Able to report PLOF, DME, home lay out and assist at home without difficulty. At baseline pt is indep with ADL's/IADL's and home/community ambulation. Reporting pain 3-4/10 NPS throughout session at rest and with mobility. Pt' demo'ing excellent quad control via SLR prior to mobility. Mod-I for bed mobility with bed features and relies on x2 bouts of momentum to stand to RW with safe hand placement. Pt amb 200' with RW with supervision progressing from step to pattern to step through pattern with VC's but does rely on increased UE support on RW with step through pattern. Safe completion of stairs with step to pattern and B rails with good quad stability and balance throughout. Pt returned to room in recliner with good understanding of HEP with packet provided. Pt currently with functional limitations due to the deficits listed below (see PT Problem List). Pt will benefit from skilled PT to increase their independence and safety with mobility to allow discharge to the venue listed below.    ? ?Recommendations for follow up therapy are one component of a multi-disciplinary discharge planning process, led by the attending physician.  Recommendations may be updated based on patient status, additional functional criteria and insurance authorization. ? ?Follow Up Recommendations Home health PT (OP PT if insurance won't authorize HHPT.) ? ?  ?Assistance Recommended at Discharge PRN  ?Patient can return home with the following ? Assist for transportation;Help with stairs or ramp for entrance ? ?  ?Equipment Recommendations None recommended by PT  ?Recommendations  for Other Services ?    ?  ?Functional Status Assessment Patient has had a recent decline in their functional status and demonstrates the ability to make significant improvements in function in a reasonable and predictable amount of time.  ? ?  ?Precautions / Restrictions Precautions ?Precautions: Knee ?Precaution Booklet Issued: Yes (comment) ?Restrictions ?Weight Bearing Restrictions: Yes ?RLE Weight Bearing: Weight bearing as tolerated  ? ?  ? ?Mobility ? Bed Mobility ?Overal bed mobility: Modified Independent ?  ?  ?  ?  ?  ?  ?  ?Patient Response: Cooperative ? ?Transfers ?Overall transfer level: Needs assistance ?Equipment used: Rolling walker (2 wheels) ?Transfers: Sit to/from Stand ?Sit to Stand: Supervision ?  ?  ?  ?  ?  ?General transfer comment: x2 bouts of momentum without need for cuing for hand placement ?  ? ?Ambulation/Gait ?Ambulation/Gait assistance: Supervision ?Gait Distance (Feet): 200 Feet ?Assistive device: Rolling walker (2 wheels) ?Gait Pattern/deviations: Step-to pattern, Step-through pattern, Decreased stance time - right, Decreased step length - left ?  ?  ?  ?General Gait Details: with cuing and heavy UE support on RW, able to progress to step trhough pattern. ? ?Stairs ?Stairs: Yes ?Stairs assistance: Min guard ?Stair Management: Two rails, Step to pattern, Forwards ?Number of Stairs: 4 ?General stair comments: Good understanding of sequencing, no LOB. ? ?Wheelchair Mobility ?  ? ?Modified Rankin (Stroke Patients Only) ?  ? ?  ? ?Balance Overall balance assessment: Needs assistance ?Sitting-balance support: No upper extremity supported, Feet supported ?Sitting balance-Leahy Scale: Good ?  ?  ?  ?Standing balance-Leahy Scale: Fair ?Standing balance comment: use of UE's on RW ?  ?  ?  ?  ?  ?  ?  ?  ?  ?  ?  ?   ? ? ? ?  Pertinent Vitals/Pain Pain Assessment ?Pain Assessment: 0-10 ?Pain Score: 4  ?Pain Location: R knee ?Pain Descriptors / Indicators: Aching ?Pain Intervention(s): Limited  activity within patient's tolerance, Monitored during session, Premedicated before session, Repositioned  ? ? ?Home Living Family/patient expects to be discharged to:: Private residence ?Living Arrangements: Spouse/significant other ?Available Help at Discharge: Family;Available 24 hours/day ?Type of Home: House ?Home Access: Stairs to enter ?Entrance Stairs-Rails: Right;Left;Can reach both ?Entrance Stairs-Number of Steps: 2 ?  ?Home Layout: Two level;Full bath on main level;Able to live on main level with bedroom/bathroom ?Home Equipment: Conservation officer, nature (2 wheels);Rollator (4 wheels);BSC/3in1;Wheelchair - manual;Hand held shower head;Grab bars - tub/shower;Electric scooter ?   ?  ?Prior Function Prior Level of Function : Independent/Modified Independent ?  ?  ?  ?  ?  ?  ?  ?  ?  ? ? ?Hand Dominance  ?   ? ?  ?Extremity/Trunk Assessment  ? Upper Extremity Assessment ?Upper Extremity Assessment: Overall WFL for tasks assessed ?  ? ?Lower Extremity Assessment ?Lower Extremity Assessment: Generalized weakness;RLE deficits/detail ?RLE Deficits / Details: R TKA ?RLE Sensation: WNL ?  ? ?Cervical / Trunk Assessment ?Cervical / Trunk Assessment: Normal  ?Communication  ? Communication: No difficulties  ?Cognition Arousal/Alertness: Awake/alert ?Behavior During Therapy: [facility] for tasks assessed/performed ?Overall Cognitive Status: Within Functional Limits for tasks assessed ?  ?  ?  ?  ?  ?  ?  ?  ?  ?  ?  ?  ?  ?  ?  ?  ?  ?  ?  ? ?  ?General Comments   ? ?  ?Exercises Total Joint Exercises ?Ankle Circles/Pumps: AROM, 20 reps, Supine, Both ?Quad Sets: AROM, Strengthening, Right, 10 reps, Supine ?Short Arc Quad: AROM, Supine, Strengthening, Right, 10 reps ?Heel Slides: AROM, Strengthening, Right, Supine, 10 reps ?Hip ABduction/ADduction: AROM, Strengthening, Right, Left, 10 reps, Supine ?Straight Leg Raises: AROM, Supine, Strengthening, Right, 10 reps ?Marching in Standing: AROM, Right, Left, 5 reps ?Other Exercises ?Other  Exercises: Role of PT in acute setting, [place] status, stairs training.  ? ?Assessment/Plan  ?  ?PT Assessment Patient needs continued PT services  ?PT Problem List Decreased strength;Decreased mobility;Decreased range of motion;Pain ? ?   ?  ?PT Treatment Interventions DME instruction;Therapeutic exercise;Gait training;Balance training;Stair training;Neuromuscular re-education;Functional mobility training;Therapeutic activities;Patient/family education   ? ?PT Goals (Current goals can be found in the Care Plan section)  ?Acute Rehab PT Goals ?Patient Stated Goal: Go home ?PT Goal Formulation: With patient ?Time For Goal Achievement: 02/28/22 ?Potential to Achieve Goals: Good ? ?  ?Frequency BID ?  ? ? ?Co-evaluation   ?  ?  ?  ?  ? ? ?  ?AM-PAC PT "6 Clicks" Mobility  ?Outcome Measure Help needed turning from your back to your side while in a flat bed without using bedrails?: None ?Help needed moving from lying on your back to sitting on the side of a flat bed without using bedrails?: None ?Help needed moving to and from a bed to a chair (including a wheelchair)?: A Little ?Help needed standing up from a chair using your arms (e.g., wheelchair or bedside chair)?: A Little ?Help needed to walk in hospital room?: A Little ?Help needed climbing 3-5 steps with a railing? : A Little ?6 Click Score: 20 ? ?  ?End of Session Equipment Utilized During Treatment: Gait belt ?Activity Tolerance: Patient tolerated treatment well ?Patient left: in chair;with call bell/phone within reach ?Nurse Communication: Mobility status ?PT Visit Diagnosis: Other  abnormalities of gait and mobility (R26.89);Muscle weakness (generalized) (M62.81) ?  ? ?Time: 3494-9447 ?PT Time Calculation (min) (ACUTE ONLY): 33 min ? ? ?Charges:   PT Evaluation ?$PT Eval Low Complexity: 1 Low ?PT Treatments ?$Gait Training: 8-22 mins ?$Therapeutic Exercise: 8-22 mins ?  ?   ? ?Salem Caster. Fairly IV, PT, DPT ?Physical Therapist- [place]  ?[place]  ?02/14/2022, 10:38 AM ? ?

## 2022-02-14 NOTE — Progress Notes (Signed)
Patient discharged home via private vehicle. Discharge instructions given. Pt verbalizes understanding. ?

## 2022-02-14 NOTE — Discharge Instructions (Signed)

## 2022-02-14 NOTE — Progress Notes (Signed)
Inpatient Diabetes Program Recommendations ? ?AACE/ADA: New Consensus Statement on Inpatient Glycemic Control  ? ?Target Ranges:  Prepandial:   less than 140 mg/dL ?     Peak postprandial:   less than 180 mg/dL (1-2 hours) ?     Critically ill patients:  140 - 180 mg/dL  ? ? Latest Reference Range & Units 02/14/22 08:52  ?Glucose-Capillary 70 - 99 mg/dL 219 (H)  ? ? Latest Reference Range & Units 02/13/22 07:57 02/13/22 12:55 02/13/22 16:29 02/13/22 20:58  ?Glucose-Capillary 70 - 99 mg/dL 164 (H) 153 (H) 204 (H) 260 (H)  ? ?Review of Glycemic Control ? ?Diabetes history: DM2 ?Outpatient Diabetes medications: Trulicity 1.74 mg Qweek, Glipizide XL 10 mg QAM, Levemir 65 units QHS ?Current orders for Inpatient glycemic control: None ? ?Inpatient Diabetes Program Recommendations:   ? ?Insulin: Please consider ordering Levemir 20 units Q24H, CBGs AC&HS, Novolog 0-15 units TID with meals, and Novolog 0-5 units QHS. ? ?NOTE: Per chart, patient has DM2, had total knee surgery on 02/13/22, and received Decadron 5 mg at 10:50 on 02/13/22.  ? ?Thanks, ?Barnie Alderman, RN, MSN, CDE ?Diabetes Coordinator ?Inpatient Diabetes Program ?[phone] (Team Pager from 8am to 5pm) ? ? ? ?

## 2022-02-14 NOTE — Evaluation (Signed)
Occupational Therapy Evaluation ?Patient Details ?Name: Diana Deleon ?MRN: [id number] ?DOB: 26-Nov-1961 ?Today's Date: 02/14/2022 ? ? ?History of Present Illness [name] is s/p elective R TKA. PMH includes: history of breast cancer, COPD, HTN, DM, L TKA in 2021.  ? ?Clinical Impression ?  ?Pt seen for OT evaluation this date in setting of acute hospital stay s/p elective R TKA. She had L TKA in 2021 and is vaguely familiar with follow up. She presents this date pleasant and agreeable to session. She reports living w/ spouse and being INDEP at baseline. She does have copious amounts of adaptive equipment/devices at home d/t caring for her late mother. She does require some increased time for LB ADLs today d/t some limited tolerance for R knee ROM, but appears as one would expect POD 1. She requires SUPV for ADL transfers and fxl mobility and demos good carryover of education re: safe sequencing/use of AD. She is returned to bed end of session with all needs met and in reach. Polar care and compression stocking education completed and handout issued. No further OT needs detected at this time.  ?   ? ?Recommendations for follow up therapy are one component of a multi-disciplinary discharge planning process, led by the attending physician.  Recommendations may be updated based on patient status, additional functional criteria and insurance authorization.  ? ?Follow Up Recommendations ? No OT follow up  ?  ?Assistance Recommended at Discharge Set up Supervision/Assistance  ?Patient can return home with the following Assistance with cooking/housework;Assist for transportation;Help with stairs or ramp for entrance ? ?  ?Functional Status Assessment ? Patient has not had a recent decline in their functional status  ?Equipment Recommendations ? BSC/3in1;Other (comment) (2ww)  ?  ?Recommendations for Other Services   ? ? ?  ?Precautions / Restrictions Precautions ?Precautions: Knee ?Precaution Booklet Issued: Yes  (comment) ?Restrictions ?Weight Bearing Restrictions: Yes ?RLE Weight Bearing: Weight bearing as tolerated  ? ?  ? ?Mobility Bed Mobility ?Overal bed mobility: Modified Independent ?  ?  ?  ?  ?  ?  ?General bed mobility comments: able to elevate both lower extremities back to bed ?  ? ?Transfers ?Overall transfer level: Needs assistance ?Equipment used: Rolling walker (2 wheels) ?Transfers: Sit to/from Stand ?Sit to Stand: Supervision ?  ?  ?  ?  ?  ?General transfer comment: demos good control, good carryover for use of RW ?  ? ?  ?Balance Overall balance assessment: Needs assistance ?Sitting-balance support: No upper extremity supported, Feet supported ?Sitting balance-Leahy Scale: Good ?  ?  ?Standing balance support: Bilateral upper extremity supported ?Standing balance-Leahy Scale: Fair ?Standing balance comment: able to alterante hands from walker for static standing ADL tasks, b/l UE support for fxl mobility ?  ?  ?  ?  ?  ?  ?  ?  ?  ?  ?  ?   ? ?ADL either performed or assessed with clinical judgement  ? ?ADL   ?  ?  ?  ?  ?  ?  ?  ?  ?  ?  ?  ?  ?  ?  ?  ?  ?  ?  ?  ?General ADL Comments: SETUP for seated UB and LB ADLs, requires increased time with LB ADLs d/t some R knee post-op limitations with ROM, but overall performs with no physical assist, just SETUP and cues for modification.  ? ? ? ?Vision Baseline Vision/History: 1 Wears glasses ?Patient Visual Report: No  change from baseline ?   ?   ?Perception   ?  ?Praxis   ?  ? ?Pertinent Vitals/Pain Pain Assessment ?Pain Assessment: 0-10 ?Pain Score: 1  ?Pain Location: R knee 1 at rest, 4 w/ activity ?Pain Descriptors / Indicators: Sore ?Pain Intervention(s): Monitored during session, Repositioned  ? ? ? ?Hand Dominance   ?  ?Extremity/Trunk Assessment Upper Extremity Assessment ?Upper Extremity Assessment: Overall WFL for tasks assessed ?  ?Lower Extremity Assessment ?Lower Extremity Assessment: RLE deficits/detail ?RLE Deficits / Details: R TKA ?RLE  Sensation: WNL ?  ?Cervical / Trunk Assessment ?Cervical / Trunk Assessment: Normal ?  ?Communication Communication ?Communication: No difficulties ?  ?Cognition Arousal/Alertness: Awake/alert ?Behavior During Therapy: [facility] for tasks assessed/performed ?Overall Cognitive Status: Within Functional Limits for tasks assessed ?  ?  ?  ?  ?  ?  ?  ?  ?  ?  ?  ?  ?  ?  ?  ?  ?  ?  ?  ?General Comments    ? ?  ?Exercises Other Exercises ?Other Exercises: OT ed re: compression stocking mgt, polar care mgt, LB ADL task modification ?  ?Shoulder Instructions    ? ? ?Home Living Family/patient expects to be discharged to:: Private residence ?Living Arrangements: Spouse/significant other ?Available Help at Discharge: Family;Available 24 hours/day ?Type of Home: House ?Home Access: Stairs to enter ?Entrance Stairs-Number of Steps: 2 ?Entrance Stairs-Rails: Right;Left;Can reach both ?Home Layout: Two level;Full bath on main level;Able to live on main level with bedroom/bathroom ?  ?  ?Bathroom Shower/Tub: Walk-in shower ?  ?Bathroom Toilet: Standard ?  ?  ?Home Equipment: Conservation officer, nature (2 wheels);Rollator (4 wheels);BSC/3in1;Wheelchair - manual;Hand held shower head;Grab bars - tub/shower;Electric scooter ?  ?  ?  ? ?  ?Prior Functioning/Environment Prior Level of Function : Independent/Modified Independent ?  ?  ?  ?  ?  ?  ?  ?  ?  ? ?  ?  ?OT Problem List: Decreased range of motion;Decreased activity tolerance;Pain ?  ?   ?OT Treatment/Interventions: Self-care/ADL training;DME and/or AE instruction  ?  ?OT Goals(Current goals can be found in the care plan section) Acute Rehab OT Goals ?Patient Stated Goal: to go home ?OT Goal Formulation: All assessment and education complete, DC therapy  ?OT Frequency:   ?  ? ?Co-evaluation   ?  ?  ?  ?  ? ?  ?AM-PAC OT "6 Clicks" Daily Activity     ?Outcome Measure Help from another person eating meals?: None ?Help from another person taking care of personal grooming?: None ?Help from  another person toileting, which includes using toliet, bedpan, or urinal?: None ?Help from another person bathing (including washing, rinsing, drying)?: A Little ?Help from another person to put on and taking off regular upper body clothing?: None ?Help from another person to put on and taking off regular lower body clothing?: A Little ?6 Click Score: 22 ?  ?End of Session Equipment Utilized During Treatment: Gait belt;Rolling walker (2 wheels) ?Nurse Communication: Mobility status ? ?Activity Tolerance: Patient tolerated treatment well ?Patient left: in bed;with call bell/phone within reach ? ?OT Visit Diagnosis: Pain;Other abnormalities of gait and mobility (R26.89) ?Pain - Right/Left: Right ?Pain - part of body: Knee  ?              ?Time: 1208-1229 ?OT Time Calculation (min): 21 min ?Charges:  OT General Charges ?$OT Visit: 1 Visit ?OT Evaluation ?$OT Eval Low Complexity: 1 Low ?OT Treatments ?$Self  Care/Home Management : 8-22 mins ? ?Gerrianne Scale, [place], OTR/L ?ascom [phone] ?02/14/22, 2:46 PM  ?

## 2022-02-14 NOTE — TOC Progression Note (Signed)
Transition of Care (TOC) - Progression Note  ? ? ?Patient Details  ?Name: Diana Deleon ?MRN: [id number] ?Date of Birth: Mar 03, 1961 ? ?Transition of Care (TOC) CM/SW Contact  ?Conception Oms, RN ?Phone Number: ?02/14/2022, 8:38 AM ? ?Clinical Narrative:   TOC will follow the patient and assist with DC planning and needs ?PT to eval and make recommendation ? ? ? ?  ?  ? ?Expected Discharge Plan and Services ?  ?  ?  ?  ?  ?                ?  ?  ?  ?  ?  ?  ?  ?  ?  ?  ? ? ?Social Determinants of Health (SDOH) Interventions ?  ? ?Readmission Risk Interventions ?No flowsheet data found. ? ?

## 2022-02-14 NOTE — Progress Notes (Signed)
Physical Therapy Treatment ?Patient Details ?Name: Diana Deleon ?MRN: [id number] ?DOB: September 17, 1961 ?Today's Date: 02/14/2022 ? ? ?History of Present Illness Marcee Jacobs is s/p elective R TKA. PMH includes: history of breast cancer, COPD, HTN, DM, L TKA in 2021. ? ?  ?PT Comments  ? ? Pt received supine in bed agreeable to p.m. session. Pt states good understanding of HEP and declines need to review exercises or perform stairs. Focus of session on progression of gait with consistent R heel strike and step through pattern to optimize normal gait mechanics, knee stability, and  terminal knee extension. Pt requiring mod VC's initially for step through and heel strike during gait but is able to with concentration and after cuing. Pt maintaining safe household and short community distances with supervision. Pt returned to bed with all needs in reach. Pt indep in donning polar care and SCD's once in supine. Pt remains safe with return back to home environment. ?  ?Recommendations for follow up therapy are one component of a multi-disciplinary discharge planning process, led by the attending physician.  Recommendations may be updated based on patient status, additional functional criteria and insurance authorization. ? ?Follow Up Recommendations ? Outpatient PT ?  ?  ?Assistance Recommended at Discharge PRN  ?Patient can return home with the following Assist for transportation;Help with stairs or ramp for entrance ?  ?Equipment Recommendations ? None recommended by PT  ?  ?Recommendations for Other Services   ? ? ?  ?Precautions / Restrictions Precautions ?Precautions: Knee ?Precaution Booklet Issued: Yes (comment) ?Restrictions ?Weight Bearing Restrictions: Yes ?RLE Weight Bearing: Weight bearing as tolerated  ?  ? ?Mobility ? Bed Mobility ?Overal bed mobility: Modified Independent ?  ?  ?  ?  ?  ?  ?  ?Patient Response: Cooperative ? ?Transfers ?Overall transfer level: Needs assistance ?Equipment used: Rolling walker (2  wheels) ?Transfers: Sit to/from Stand ?Sit to Stand: Supervision ?  ?  ?  ?  ?  ?General transfer comment: x1 bout of momentum. Remains with safe hand placement. ?  ? ?Ambulation/Gait ?Ambulation/Gait assistance: Supervision ?Gait Distance (Feet): 180 Feet ?Assistive device: Rolling walker (2 wheels) ?Gait Pattern/deviations: Step-through pattern, Decreased step length - right, Decreased stance time - right ?  ?  ?  ?General Gait Details: Continued to require mod VC's for consistent R heel strike and step through gait. Remains on need for heavy UE reliance on RW with step through gait. ? ? ?Stairs ?Stairs: Yes ?Stairs assistance: Min guard ?Stair Management: Two rails, Step to pattern, Forwards ?Number of Stairs: 4 ?General stair comments: Declined need to perform stair training prior to d/c ? ? ?Wheelchair Mobility ?  ? ?Modified Rankin (Stroke Patients Only) ?  ? ? ?  ?Balance Overall balance assessment: Needs assistance ?Sitting-balance support: No upper extremity supported, Feet supported ?Sitting balance-Leahy Scale: Good ?  ?  ?  ?Standing balance-Leahy Scale: Fair ?Standing balance comment: use of UE's on RW ?  ?  ?  ?  ?  ?  ?  ?  ?  ?  ?  ?  ? ?  ?Cognition Arousal/Alertness: Awake/alert ?Behavior During Therapy: [facility] for tasks assessed/performed ?Overall Cognitive Status: Within Functional Limits for tasks assessed ?  ?  ?  ?  ?  ?  ?  ?  ?  ?  ?  ?  ?  ?  ?  ?  ?  ?  ?  ? ?  ?Exercises Total Joint Exercises ?Ankle Circles/Pumps: AROM, 20  reps, Supine, Both ?Quad Sets: AROM, Strengthening, Right, 10 reps, Supine ?Short Arc Quad: AROM, Supine, Strengthening, Right, 10 reps ?Heel Slides: AROM, Strengthening, Right, Supine, 10 reps ?Hip ABduction/ADduction: AROM, Strengthening, Right, Left, 10 reps, Supine ?Straight Leg Raises: AROM, Supine, Strengthening, Right, 10 reps ?Marching in Standing: AROM, Right, Left, 5 reps ?Other Exercises ?Other Exercises: education on car transfer ? ?  ?General Comments   ?  ?   ? ?Pertinent Vitals/Pain Pain Assessment ?Pain Assessment: 0-10 ?Pain Score: 3  ?Pain Location: R knee ?Pain Descriptors / Indicators: Aching ?Pain Intervention(s): Limited activity within patient's tolerance, Repositioned, Ice applied  ? ? ?Home Living Family/patient expects to be discharged to:: Private residence ?Living Arrangements: Spouse/significant other ?Available Help at Discharge: Family;Available 24 hours/day ?Type of Home: House ?Home Access: Stairs to enter ?Entrance Stairs-Rails: Right;Left;Can reach both ?Entrance Stairs-Number of Steps: 2 ?  ?Home Layout: Two level;Full bath on main level;Able to live on main level with bedroom/bathroom ?Home Equipment: Conservation officer, nature (2 wheels);Rollator (4 wheels);BSC/3in1;Wheelchair - manual;Hand held shower head;Grab bars - tub/shower;Electric scooter ?   ?  ?Prior Function    ?  ?  ?   ? ?PT Goals (current goals can now be found in the care plan section) Acute Rehab PT Goals ?Patient Stated Goal: Go home ?PT Goal Formulation: With patient ?Time For Goal Achievement: 02/28/22 ?Potential to Achieve Goals: Good ?Progress towards PT goals: Progressing toward goals ? ?  ?Frequency ? ? ? BID ? ? ? ?  ?PT Plan Current plan remains appropriate  ? ? ?Co-evaluation   ?  ?  ?  ?  ? ?  ?AM-PAC PT "6 Clicks" Mobility   ?Outcome Measure ? Help needed turning from your back to your side while in a flat bed without using bedrails?: None ?Help needed moving from lying on your back to sitting on the side of a flat bed without using bedrails?: None ?Help needed moving to and from a bed to a chair (including a wheelchair)?: A Little ?Help needed standing up from a chair using your arms (e.g., wheelchair or bedside chair)?: A Little ?Help needed to walk in hospital room?: A Little ?Help needed climbing 3-5 steps with a railing? : A Little ?6 Click Score: 20 ? ?  ?End of Session Equipment Utilized During Treatment: Gait belt ?Activity Tolerance: Patient tolerated treatment  well ?Patient left: in bed;with call bell/phone within reach;with SCD's reapplied ?Nurse Communication: Mobility status ?PT Visit Diagnosis: Other abnormalities of gait and mobility (R26.89);Muscle weakness (generalized) (M62.81) ?  ? ? ?Time: 7494-4967 ?PT Time Calculation (min) (ACUTE ONLY): 12 min ? ?Charges:  $Gait Training: 8-22 mins ?$Therapeutic Exercise: 8-22 mins          ?          ? ?Salem Caster. Fairly IV, PT, DPT ?Physical Therapist- [place]  ?[facility]  ?02/14/2022, 1:02 PM ? ?

## 2022-02-14 NOTE — TOC Progression Note (Signed)
Transition of Care (TOC) - Progression Note  ? ? ?Patient Details  ?Name: Diana Deleon ?MRN: [id number] ?Date of Birth: 1961/08/27 ? ?Transition of Care (TOC) CM/SW Contact  ?Conception Oms, RN ?Phone Number: ?02/14/2022, 12:51 PM ? ?Clinical Narrative:    ?Patient has DME at home ?Rolling Walker (2 wheels);Rollator (4 wheels);BSC/3in1;Wheelchair - manual;Hand held shower head;Grab bars - tub/shower;Electric scooter ? ?Unable to find a [facility] agency to accept the patient, She will need to go to Outpatient PT, I  Notified the Physician ? ? ?  ?  ? ?Expected Discharge Plan and Services ?  ?  ?  ?  ?  ?Expected Discharge Date: 02/14/22               ?  ?  ?  ?  ?  ?  ?  ?  ?  ?  ? ? ?Social Determinants of Health (SDOH) Interventions ?  ? ?Readmission Risk Interventions ?No flowsheet data found. ? ?

## 2022-02-14 NOTE — Progress Notes (Signed)
Nsg Discharge Note ? ?Admit Date:  02/13/2022 ?Discharge date: 02/14/2022 ?  ?Janann August to be D/C'd Home per MD order.  AVS completed.  Patient/caregiver able to verbalize understanding. ? ?Discharge Medication: ?Allergies as of 02/14/2022   ? ?   Reactions  ? Metformin And Related Diarrhea  ? Norvasc [amlodipine Besylate] Swelling  ? ?  ? ?  ?Medication List  ?  ? ?STOP taking these medications   ? ?aspirin EC 81 MG tablet ?  ?aspirin-acetaminophen-caffeine 250-250-65 MG tablet ?Commonly known as: [place] ?  ?meloxicam 7.5 MG tablet ?Commonly known as: MOBIC ?  ? ?  ? ?TAKE these medications   ? ?albuterol 108 (90 Base) MCG/ACT inhaler ?Commonly known as: VENTOLIN HFA ?TAKE 2 PUFFS BY MOUTH EVERY 6 HOURS AS NEEDED FOR WHEEZE OR SHORTNESS OF BREATH ?What changed: See the new instructions. ?  ?alendronate 70 MG tablet ?Commonly known as: FOSAMAX ?TAKE 1 TABLET BY MOUTH EVERY 7 DAYS. TAKE WITH A FULL GLASS OF WATER ON AN EMPTY STOMACH. ?What changed: See the new instructions. ?  ?betamethasone dipropionate 0.05 % ointment ?Commonly known as: DIPROLENE ?Apply 1 application topically 2 (two) times daily as needed (ears (psoriasis)). ?  ?Calcium Carb-Cholecalciferol 600-800 MG-UNIT Tabs ?Take 2 tablets by mouth every evening. ?  ?clobetasol 0.05 % external solution ?Commonly known as: TEMOVATE ?Apply 1 application topically 2 (two) times daily as needed (scalp psoriasis). ?  ?docusate sodium 100 MG capsule ?Commonly known as: COLACE ?Take 1 capsule (100 mg total) by mouth 2 (two) times daily. ?  ?enoxaparin 40 MG/0.4ML injection ?Commonly known as: LOVENOX ?Inject 0.4 mLs (40 mg total) into the skin daily for 14 days. ?  ?glipiZIDE 10 MG 24 hr tablet ?Commonly known as: GLUCOTROL XL ?Take 1 tablet (10 mg total) by mouth daily with breakfast. ?  ?HYDROcodone-acetaminophen 7.5-325 MG tablet ?Commonly known as: NORCO ?Take 1-2 tablets by mouth every 4 (four) hours as needed for severe pain (pain score 7-10). ?   ?Levemir FlexTouch 100 UNIT/ML FlexPen ?Generic drug: insulin detemir ?INJECT 65 UNITS INTO SKIN AT BEDTIME ?  ?levothyroxine 50 MCG tablet ?Commonly known as: SYNTHROID ?Take 1 tablet (50 mcg total) by mouth daily before breakfast. ?  ?losartan-hydrochlorothiazide 100-25 MG tablet ?Commonly known as: HYZAAR ?Take 1 tablet by mouth daily. ?  ?methocarbamol 500 MG tablet ?Commonly known as: ROBAXIN ?Take 1 tablet (500 mg total) by mouth every 6 (six) hours as needed for muscle spasms. ?  ?metoprolol succinate 50 MG 24 hr tablet ?Commonly known as: TOPROL-XL ?TAKE 1 TABLET BY MOUTH DAILY. TAKE WITH OR IMMEDIATELY FOLLOWING A MEAL. ?  ?multivitamin with minerals Tabs tablet ?Take 1 tablet by mouth every evening. ?  ?omeprazole 20 MG capsule ?Commonly known as: PRILOSEC ?TAKE 1 CAPSULE BY MOUTH DAILY BEFORE BREAKFAST ?  ?OneTouch Delica Lancets Fine Misc ?Use to check blood sugar once daily and as needed (dx. E11.9) ?  ?OneTouch Verio test strip ?Generic drug: glucose blood ?USE TO CHECK BLOOD SUGAR ONCE DAILY AND AS NEEDED ?  ?polyethylene glycol 17 g packet ?Commonly known as: MIRALAX / GLYCOLAX ?Take 17 g by mouth daily as needed for mild constipation. ?  ?rosuvastatin 5 MG tablet ?Commonly known as: CRESTOR ?TAKE 1 TABLET (5 MG TOTAL) BY MOUTH DAILY. ?  ?SKYRIZI (150 MG DOSE) [place] ?Inject 150 mcg into the skin every 3 (three) months. Take as directed (every 3 month) ?  ?traMADol 50 MG tablet ?Commonly known as: ULTRAM ?Take 1 tablet (50  mg total) by mouth every 6 (six) hours. ?  ?Trulicity 5.85 ID/7.8EU Sopn ?Generic drug: Dulaglutide ?Inject 0.75 mg into the skin once a week. ?What changed: when to take this ?  ?valACYclovir 500 MG tablet ?Commonly known as: VALTREX ?TAKE 1 TABLET(500 MG) BY MOUTH TWICE DAILY FOR 5 DAYS FOR OUTBREAK ?  ?venlafaxine XR 75 MG 24 hr capsule ?Commonly known as: EFFEXOR-XR ?TAKE 1 CAPSULE(75 MG) BY MOUTH DAILY WITH BREAKFAST ?  ? ?  ? ?  ?  ? ? ?  ?Durable Medical Equipment  ?(From  admission, onward)  ?  ? ? ?  ? ?  Start     Ordered  ? 02/13/22 1713  DME Walker rolling  Once       ?Question Answer Comment  ?Walker: With 5 Inch Wheels   ?Patient needs a walker to treat with the following condition S/P TKR (total knee replacement) using cement, right   ?  ? 02/13/22 1712  ? 02/13/22 1713  DME 3 n 1  Once       ? 02/13/22 1712  ? 02/13/22 1713  DME Bedside commode  Once       ?Question:  Patient needs a bedside commode to treat with the following condition  Answer:  S/P TKR (total knee replacement) using cement, right  ? 02/13/22 1712  ? ?  ?  ? ?  ? ? ?Discharge Assessment: ?Vitals:  ? 02/14/22 0849 02/14/22 1130  ?BP: 120/71 134/81  ?Pulse: 79 62  ?Resp: 17 16  ?Temp: 98.1 ?F (36.7 ?C) 98.2 ?F (36.8 ?C)  ?SpO2: 97% 99%  ? Skin clean, dry and intact without evidence of skin break down, no evidence of skin tears noted. ?IV catheter discontinued intact. Site without signs and symptoms of complications - no redness or edema noted at insertion site, patient denies c/o pain - only slight tenderness at site.  Dressing with slight pressure applied. ? ?D/c Instructions-Education: ?Discharge instructions given to patient/family with verbalized understanding. ?D/c education completed with patient/family including follow up instructions, medication list, d/c activities limitations if indicated, with other d/c instructions as indicated by MD - patient able to verbalize understanding, all questions fully answered. ?Patient instructed to return to ED, call 911, or call MD for any changes in condition.  ?Patient escorted via [place], and D/C home via private auto. ? ?Tresa Endo, RN ?02/14/2022 12:46 PM ? ?

## 2022-02-14 NOTE — Progress Notes (Signed)
? ?  Subjective: ?1 Day Post-Op Procedure(s) (LRB): ?TOTAL KNEE ARTHROPLASTY (Right) ?Patient reports pain as mild.   ?Patient is well, and has had no acute complaints or problems ?Denies any CP, SOB, ABD pain. ?We will continue therapy today.  ?Plan is to go Home after hospital stay. ? ?Objective: ?Vital signs in last 24 hours: ?Temp:  [96.9 ?F (36.1 ?C)-98.6 ?F (37 ?C)] 97.1 ?F (36.2 ?C) (03/01 0400) ?Pulse Rate:  [59-80] 69 (03/01 0400) ?Resp:  [10-23] 17 (03/01 0400) ?BP: (96-137)/(65-94) 108/69 (03/01 0400) ?SpO2:  [92 %-100 %] 97 % (03/01 0400) ?Weight:  [99.8 kg] 99.8 kg (02/28 0814) ? ?Intake/Output from previous day: ?02/28 0701 - 03/01 0700 ?In: 3120.7 [P.O.:400; I.V.:2176.7; IV [id number]:268] ?Out: 1035 [Urine:1020; Blood:15] ?Intake/Output this shift: ?No intake/output data recorded. ? ?Recent Labs  ?  02/13/22 ?1804 02/14/22 ?3419  ?HGB 13.5 12.3  ? ?Recent Labs  ?  02/13/22 ?1804 02/14/22 ?6222  ?WBC 9.1 12.5*  ?RBC 4.42 4.06  ?HCT 39.1 36.0  ?PLT 218 222  ? ?Recent Labs  ?  02/13/22 ?1804 02/14/22 ?9798  ?NA  --  137  ?K  --  3.4*  ?CL  --  101  ?CO2  --  27  ?BUN  --  16  ?CREATININE 1.00 1.03*  ?GLUCOSE  --  220*  ?CALCIUM  --  8.2*  ? ?No results for input(s): LABPT, INR in the last 72 hours. ? ?EXAM ?General - Patient is Alert, Appropriate, and Oriented ?Extremity - Neurovascular intact ?Sensation intact distally ?Intact pulses distally ?Dorsiflexion/Plantar flexion intact ?No cellulitis present ?Compartment soft ?Dressing - dressing C/D/I and no drainage, provena intact with out drainage ?Motor Function - intact, moving foot and toes well on exam.  ? ?Past Medical History:  ?Diagnosis Date  ? Anemia   ? "off and on"  ? Arthritis   ? "right toes" (03/24/2015)  ? Cancer of right breast ([place]) 2015  ? Cyst of cystic duct   ? chest- central location, taking Cephalexin currently  ? Diabetes mellitus without complication ([place])   ? Fibroids   ? hyst. 2006  ? GERD (gastroesophageal reflux disease)   ?  Headache   ? History of abnormal Pap smear   ? has had leep in past  ? History of cold sores   ? has used valtrex in past  ? History of kidney stones   ? History of recurrent UTIs   ? Hypertension   ? Hypothyroidism   ? Intermittent vertigo   ? Seborrheic dermatitis   ? local on R scalp and in ear (failed mult meds- uses steroid spray)  ? Thyroid disease   ? ? ?Assessment/Plan:   ?1 Day Post-Op Procedure(s) (LRB): ?TOTAL KNEE ARTHROPLASTY (Right) ?Principal Problem: ?  S/P TKR (total knee replacement) using cement, right ? ?Estimated body mass index is 37.76 kg/m? as calculated from the following: ?  Height as of this encounter: 5\' 4"  (1.626 m). ?  Weight as of this encounter: 99.8 kg. ?Advance diet ?Up with therapy ?Pain controlled ?VSS ?Labs stable, K 3.4, will supplement with oral K ?CM to assist with dc to home with HHPT pending completion of PT goals ? ?DVT Prophylaxis - Lovenox, TED hose, and SCDs ?Weight-Bearing as tolerated to right leg ? ? ?T. Rachelle Hora, PA-C ?[place] ?02/14/2022, 8:13 AM ?  ?

## 2022-02-15 DIAGNOSIS — M6281 Muscle weakness (generalized): Secondary | ICD-10-CM | POA: Diagnosis not present

## 2022-02-15 DIAGNOSIS — M25661 Stiffness of right knee, not elsewhere classified: Secondary | ICD-10-CM | POA: Diagnosis not present

## 2022-02-15 DIAGNOSIS — M25561 Pain in right knee: Secondary | ICD-10-CM | POA: Diagnosis not present

## 2022-02-15 DIAGNOSIS — Z96651 Presence of right artificial knee joint: Secondary | ICD-10-CM | POA: Diagnosis not present

## 2022-02-19 DIAGNOSIS — M6281 Muscle weakness (generalized): Secondary | ICD-10-CM | POA: Diagnosis not present

## 2022-02-19 DIAGNOSIS — Z96651 Presence of right artificial knee joint: Secondary | ICD-10-CM | POA: Diagnosis not present

## 2022-02-19 DIAGNOSIS — M25661 Stiffness of right knee, not elsewhere classified: Secondary | ICD-10-CM | POA: Diagnosis not present

## 2022-02-19 DIAGNOSIS — M25561 Pain in right knee: Secondary | ICD-10-CM | POA: Diagnosis not present

## 2022-02-21 DIAGNOSIS — Z96651 Presence of right artificial knee joint: Secondary | ICD-10-CM | POA: Diagnosis not present

## 2022-02-22 ENCOUNTER — Other Ambulatory Visit: Payer: Self-pay | Admitting: Family Medicine

## 2022-02-23 ENCOUNTER — Other Ambulatory Visit: Payer: Self-pay | Admitting: Family Medicine

## 2022-02-23 DIAGNOSIS — G8929 Other chronic pain: Secondary | ICD-10-CM | POA: Diagnosis not present

## 2022-02-23 DIAGNOSIS — Z96651 Presence of right artificial knee joint: Secondary | ICD-10-CM | POA: Diagnosis not present

## 2022-02-23 DIAGNOSIS — M25561 Pain in right knee: Secondary | ICD-10-CM | POA: Diagnosis not present

## 2022-02-23 DIAGNOSIS — M25562 Pain in left knee: Secondary | ICD-10-CM | POA: Diagnosis not present

## 2022-02-23 DIAGNOSIS — M25661 Stiffness of right knee, not elsewhere classified: Secondary | ICD-10-CM | POA: Diagnosis not present

## 2022-02-23 DIAGNOSIS — M6281 Muscle weakness (generalized): Secondary | ICD-10-CM | POA: Diagnosis not present

## 2022-02-26 DIAGNOSIS — M25661 Stiffness of right knee, not elsewhere classified: Secondary | ICD-10-CM | POA: Diagnosis not present

## 2022-02-26 DIAGNOSIS — M6281 Muscle weakness (generalized): Secondary | ICD-10-CM | POA: Diagnosis not present

## 2022-02-26 DIAGNOSIS — Z96651 Presence of right artificial knee joint: Secondary | ICD-10-CM | POA: Diagnosis not present

## 2022-02-26 DIAGNOSIS — M25561 Pain in right knee: Secondary | ICD-10-CM | POA: Diagnosis not present

## 2022-02-28 DIAGNOSIS — Z96651 Presence of right artificial knee joint: Secondary | ICD-10-CM | POA: Diagnosis not present

## 2022-03-07 DIAGNOSIS — Z96651 Presence of right artificial knee joint: Secondary | ICD-10-CM | POA: Diagnosis not present

## 2022-03-07 DIAGNOSIS — M6281 Muscle weakness (generalized): Secondary | ICD-10-CM | POA: Diagnosis not present

## 2022-03-07 DIAGNOSIS — M25661 Stiffness of right knee, not elsewhere classified: Secondary | ICD-10-CM | POA: Diagnosis not present

## 2022-03-07 DIAGNOSIS — M25561 Pain in right knee: Secondary | ICD-10-CM | POA: Diagnosis not present

## 2022-03-09 DIAGNOSIS — M25661 Stiffness of right knee, not elsewhere classified: Secondary | ICD-10-CM | POA: Diagnosis not present

## 2022-03-09 DIAGNOSIS — M6281 Muscle weakness (generalized): Secondary | ICD-10-CM | POA: Diagnosis not present

## 2022-03-09 DIAGNOSIS — M25561 Pain in right knee: Secondary | ICD-10-CM | POA: Diagnosis not present

## 2022-03-09 DIAGNOSIS — Z96651 Presence of right artificial knee joint: Secondary | ICD-10-CM | POA: Diagnosis not present

## 2022-03-14 DIAGNOSIS — M25661 Stiffness of right knee, not elsewhere classified: Secondary | ICD-10-CM | POA: Diagnosis not present

## 2022-03-14 DIAGNOSIS — M25561 Pain in right knee: Secondary | ICD-10-CM | POA: Diagnosis not present

## 2022-03-14 DIAGNOSIS — Z96651 Presence of right artificial knee joint: Secondary | ICD-10-CM | POA: Diagnosis not present

## 2022-03-14 DIAGNOSIS — M6281 Muscle weakness (generalized): Secondary | ICD-10-CM | POA: Diagnosis not present

## 2022-03-16 DIAGNOSIS — M25661 Stiffness of right knee, not elsewhere classified: Secondary | ICD-10-CM | POA: Diagnosis not present

## 2022-03-16 DIAGNOSIS — M6281 Muscle weakness (generalized): Secondary | ICD-10-CM | POA: Diagnosis not present

## 2022-03-16 DIAGNOSIS — Z96651 Presence of right artificial knee joint: Secondary | ICD-10-CM | POA: Diagnosis not present

## 2022-03-16 DIAGNOSIS — M25561 Pain in right knee: Secondary | ICD-10-CM | POA: Diagnosis not present

## 2022-03-26 ENCOUNTER — Telehealth: Payer: Self-pay | Admitting: Family Medicine

## 2022-03-26 DIAGNOSIS — E1169 Type 2 diabetes mellitus with other specified complication: Secondary | ICD-10-CM

## 2022-03-26 DIAGNOSIS — E1165 Type 2 diabetes mellitus with hyperglycemia: Secondary | ICD-10-CM

## 2022-03-26 DIAGNOSIS — Z79899 Other long term (current) drug therapy: Secondary | ICD-10-CM

## 2022-03-26 DIAGNOSIS — E039 Hypothyroidism, unspecified: Secondary | ICD-10-CM

## 2022-03-26 DIAGNOSIS — Z0000 Encounter for general adult medical examination without abnormal findings: Secondary | ICD-10-CM

## 2022-03-26 DIAGNOSIS — I10 Essential (primary) hypertension: Secondary | ICD-10-CM

## 2022-03-26 NOTE — Telephone Encounter (Signed)
-----   Message from Velna Hatchet, RT sent at 03/13/2022  2:36 PM EDT ----- ?Regarding: Lab orders for Wednesday, 03/28/22 ?Patient is scheduled for cpx, please order future labs.  Thanks, Anda Kraft  ? ?

## 2022-03-27 ENCOUNTER — Encounter: Payer: Self-pay | Admitting: Family Medicine

## 2022-03-27 DIAGNOSIS — R3915 Urgency of urination: Secondary | ICD-10-CM

## 2022-03-28 ENCOUNTER — Other Ambulatory Visit (INDEPENDENT_AMBULATORY_CARE_PROVIDER_SITE_OTHER): Payer: BC Managed Care – PPO

## 2022-03-28 DIAGNOSIS — E1169 Type 2 diabetes mellitus with other specified complication: Secondary | ICD-10-CM | POA: Diagnosis not present

## 2022-03-28 DIAGNOSIS — E785 Hyperlipidemia, unspecified: Secondary | ICD-10-CM

## 2022-03-28 DIAGNOSIS — R3915 Urgency of urination: Secondary | ICD-10-CM | POA: Diagnosis not present

## 2022-03-28 DIAGNOSIS — N3000 Acute cystitis without hematuria: Secondary | ICD-10-CM | POA: Diagnosis not present

## 2022-03-28 DIAGNOSIS — Z794 Long term (current) use of insulin: Secondary | ICD-10-CM | POA: Diagnosis not present

## 2022-03-28 DIAGNOSIS — Z96651 Presence of right artificial knee joint: Secondary | ICD-10-CM | POA: Diagnosis not present

## 2022-03-28 DIAGNOSIS — Z79899 Other long term (current) drug therapy: Secondary | ICD-10-CM | POA: Diagnosis not present

## 2022-03-28 DIAGNOSIS — E1165 Type 2 diabetes mellitus with hyperglycemia: Secondary | ICD-10-CM

## 2022-03-28 DIAGNOSIS — E039 Hypothyroidism, unspecified: Secondary | ICD-10-CM

## 2022-03-28 DIAGNOSIS — I10 Essential (primary) hypertension: Secondary | ICD-10-CM | POA: Diagnosis not present

## 2022-03-28 DIAGNOSIS — M1711 Unilateral primary osteoarthritis, right knee: Secondary | ICD-10-CM | POA: Diagnosis not present

## 2022-03-28 LAB — POC URINALSYSI DIPSTICK (AUTOMATED)
Blood, UA: NEGATIVE
Spec Grav, UA: 1.025 (ref 1.010–1.025)
pH, UA: 5 (ref 5.0–8.0)

## 2022-03-28 LAB — COMPREHENSIVE METABOLIC PANEL
ALT: 21 U/L (ref 0–35)
AST: 19 U/L (ref 0–37)
Albumin: 4.1 g/dL (ref 3.5–5.2)
Alkaline Phosphatase: 91 U/L (ref 39–117)
BUN: 14 mg/dL (ref 6–23)
CO2: 34 mEq/L — ABNORMAL HIGH (ref 19–32)
Calcium: 9.1 mg/dL (ref 8.4–10.5)
Chloride: 98 mEq/L (ref 96–112)
Creatinine, Ser: 1.15 mg/dL (ref 0.40–1.20)
GFR: 52.2 mL/min — ABNORMAL LOW (ref >60.00)
Glucose, Bld: 181 mg/dL — ABNORMAL HIGH (ref 70–99)
Potassium: 3.7 mEq/L (ref 3.5–5.1)
Sodium: 140 mEq/L (ref 135–145)
Total Bilirubin: 0.6 mg/dL (ref 0.2–1.2)
Total Protein: 6.7 g/dL (ref 6.0–8.3)

## 2022-03-28 LAB — CBC WITH DIFFERENTIAL/PLATELET
Basophils Absolute: 0 10*3/uL (ref 0.0–0.1)
Basophils Relative: 0.7 % (ref 0.0–3.0)
Eosinophils Absolute: 0.2 10*3/uL (ref 0.0–0.7)
Eosinophils Relative: 2.9 % (ref 0.0–5.0)
HCT: 42.7 % (ref 36.0–46.0)
Hemoglobin: 14.4 g/dL (ref 12.0–15.0)
Lymphocytes Relative: 28.5 % (ref 12.0–46.0)
Lymphs Abs: 1.5 10*3/uL (ref 0.7–4.0)
MCHC: 33.7 g/dL (ref 30.0–36.0)
MCV: 90.4 fl (ref 78.0–100.0)
Monocytes Absolute: 0.5 10*3/uL (ref 0.1–1.0)
Monocytes Relative: 10.1 % (ref 3.0–12.0)
Neutro Abs: 3.1 10*3/uL (ref 1.4–7.7)
Neutrophils Relative %: 57.8 % (ref 43.0–77.0)
Platelets: 232 10*3/uL (ref 150.0–400.0)
RBC: 4.72 Mil/uL (ref 3.87–5.11)
RDW: 13.6 % (ref 11.5–15.5)
WBC: 5.4 10*3/uL (ref 4.0–10.5)

## 2022-03-28 LAB — LIPID PANEL
Cholesterol: 151 mg/dL (ref 0–200)
HDL: 52 mg/dL (ref >39.00)
LDL Cholesterol: 60 mg/dL (ref 0–99)
NonHDL: 98.85
Total CHOL/HDL Ratio: 3
Triglycerides: 196 mg/dL — ABNORMAL HIGH (ref 0.0–149.0)
VLDL: 39.2 mg/dL (ref 0.0–40.0)

## 2022-03-28 LAB — HEMOGLOBIN A1C: Hgb A1c MFr Bld: 6.8 % — ABNORMAL HIGH (ref 4.6–6.5)

## 2022-03-28 LAB — VITAMIN B12: Vitamin B-12: 341 pg/mL (ref 211–911)

## 2022-03-28 LAB — TSH: TSH: 5.32 u[IU]/mL (ref 0.35–5.50)

## 2022-03-28 NOTE — Addendum Note (Signed)
Addended by: Tammi Sou on: 03/28/2022 08:45 AM ? ? Modules accepted: Orders ? ?

## 2022-03-30 ENCOUNTER — Telehealth: Payer: Self-pay | Admitting: Family Medicine

## 2022-03-30 LAB — URINE CULTURE
MICRO NUMBER:: 13254174
SPECIMEN QUALITY:: ADEQUATE

## 2022-03-30 MED ORDER — CEPHALEXIN 500 MG PO CAPS: 500.0000 mg | ORAL_CAPSULE | Freq: Two times a day (BID) | ORAL | 0 refills | Status: DC

## 2022-03-30 NOTE — Telephone Encounter (Signed)
Pt notified of urine cx results and Dr. Marliss Coots comments and Rx was sent to pharmacy  ?

## 2022-03-30 NOTE — Telephone Encounter (Signed)
Urine culture is positive ?I sent keflex to her pharmacy -please let her know ?Thanks  ?Let us know if not improved  ?

## 2022-04-04 ENCOUNTER — Encounter: Payer: Self-pay | Admitting: Family Medicine

## 2022-04-04 ENCOUNTER — Ambulatory Visit (INDEPENDENT_AMBULATORY_CARE_PROVIDER_SITE_OTHER): Payer: BC Managed Care – PPO | Admitting: Family Medicine

## 2022-04-04 VITALS — BP 128/76 | HR 76 | Temp 97.3°F | Ht 64.0 in | Wt 214.1 lb

## 2022-04-04 DIAGNOSIS — E1165 Type 2 diabetes mellitus with hyperglycemia: Secondary | ICD-10-CM

## 2022-04-04 DIAGNOSIS — E039 Hypothyroidism, unspecified: Secondary | ICD-10-CM

## 2022-04-04 DIAGNOSIS — Z0000 Encounter for general adult medical examination without abnormal findings: Secondary | ICD-10-CM | POA: Diagnosis not present

## 2022-04-04 DIAGNOSIS — I10 Essential (primary) hypertension: Secondary | ICD-10-CM

## 2022-04-04 DIAGNOSIS — Z6836 Body mass index (BMI) 36.0-36.9, adult: Secondary | ICD-10-CM

## 2022-04-04 DIAGNOSIS — Z794 Long term (current) use of insulin: Secondary | ICD-10-CM

## 2022-04-04 DIAGNOSIS — Z79899 Other long term (current) drug therapy: Secondary | ICD-10-CM

## 2022-04-04 DIAGNOSIS — L409 Psoriasis, unspecified: Secondary | ICD-10-CM

## 2022-04-04 DIAGNOSIS — E1169 Type 2 diabetes mellitus with other specified complication: Secondary | ICD-10-CM

## 2022-04-04 DIAGNOSIS — G2581 Restless legs syndrome: Secondary | ICD-10-CM

## 2022-04-04 DIAGNOSIS — Z1211 Encounter for screening for malignant neoplasm of colon: Secondary | ICD-10-CM

## 2022-04-04 DIAGNOSIS — E785 Hyperlipidemia, unspecified: Secondary | ICD-10-CM

## 2022-04-04 DIAGNOSIS — M8589 Other specified disorders of bone density and structure, multiple sites: Secondary | ICD-10-CM

## 2022-04-04 NOTE — Assessment & Plan Note (Signed)
Disc goals for lipids and reasons to control them ?Rev last labs with pt ?Rev low sat fat diet in detail ?LDL of 60 plan to continue crestor 5 mg daily ?

## 2022-04-04 NOTE — Assessment & Plan Note (Signed)
Sent for last dexa/ ? 2021 ?Taking alendronate and tolerates well ?No falls or fx ?Takes ca and D ?Starting back to exercise after knee repl ?

## 2022-04-04 NOTE — Assessment & Plan Note (Signed)
Lab Results  ?Component Value Date  ? HGBA1C 6.8 (H) 03/28/2022  ? ?Good control  ?Plan to continue  ?Glipizide xl 10 mg daily  ?Levemir 65 u daily  ?Trulicity 8.10 mg weekly  ?Taking arb and statin  ?No highs or lows  ?

## 2022-04-04 NOTE — Assessment & Plan Note (Signed)
Lab Results  ?Component Value Date  ? [id number] 03/28/2022  ? ? ?

## 2022-04-04 NOTE — Assessment & Plan Note (Signed)
Colonoscopy 2018 with 10 y recall ?Personal breast cancer hx ?

## 2022-04-04 NOTE — Patient Instructions (Addendum)
If you are interested in the shingles vaccine series (Shingrix), call your insurance or pharmacy to check on coverage and location it must be given.  If affordable - you can schedule it here or at your pharmacy depending on coverage  ? ?Take care of yourself ?Gradually get back to exercise  ? ?Use sun protection  ? ?I will review your chart re: restless leg syndrome  ? ?Follow up in 6 months  ? ? ? ? ?

## 2022-04-04 NOTE — Progress Notes (Signed)
? ?Subjective:  ? ? Patient ID: Diana Deleon, female    DOB: 1961/08/30, 61 y.o.   MRN: [id number] ? ?HPI ?Here for health maintenance exam and to review chronic medical problems   ? ?Wt Readings from Last 3 Encounters:  ?04/04/22 214 lb 2 oz (97.1 kg)  ?02/13/22 220 lb (99.8 kg)  ?01/30/22 218 lb 11.1 oz (99.2 kg)  ? ?36.75 kg/m? ?Lost 6 lb  ? ?Had her knee surgery  ?Sitting a lot and finally getting out of the house  ?Some aching/ a little worse than the last one  ?Still takes a little tramadol/not for much longer  ?Also methocarbamol  ? ?Has some restless leg syndrome -only getting 4-5 hours  ?No anemia  ?Methocarbamol helps a bit  ? ? ?Treating uti - better now  ?Keflex ?Drinking lots of water  ? ? ?Zoster status : interested in shingrix  ? ?Immunization History  ?Administered Date(s) Administered  ? Influenza Split 08/23/2021  ? Influenza,inj,Quad PF,6+ Mos 10/14/2013, 12/25/2017, 10/31/2018, 12/23/2019, 11/03/2020  ? Influenza-Unspecified 09/29/2014, 10/10/2015, 09/29/2016  ? PFIZER(Purple Top)SARS-COV-2 Vaccination 03/26/2020, 04/19/2020  ? Pneumococcal Polysaccharide-23 02/03/2019  ? Td 06/16/2009  ? Tdap 02/03/2019  ? ? ?Eye exam : was recent  ?12/07/21-no retinopathy this time  ? ?Pap 2020 ?Sees gyn, last visit 05/07/21  ? ? ?Mammogram  05/07/21  ?Personal h/o breast cancer-done with anastrazole ?Self breast exam : no changes or lumps  ? ?Dexa 2019-- ? 2021  ?Osteopenia  ?Taking alendronate  no problems  ?No falls or fx ?Taking ca and vit D ? ?Exercise-s/p TKA- now starting to ride bike  ? ? ?Colonoscopy 12/2016 with 10 y recall  ? ? ?HTN ?bp is stable today  ?No cp or palpitations or headaches or edema  ?No side effects to medicines  ?BP Readings from Last 3 Encounters:  ?04/04/22 128/76  ?02/14/22 134/81  ?01/30/22 (!) 134/93  ?  Losartan hct 100-25 mg daily  ?Metoprolol xl 50 mg daily  ? ?Pulse Readings from Last 3 Encounters:  ?04/04/22 76  ?02/14/22 62  ?01/03/22 89  ? ? ?DM2 ?Lab Results  ?Component Value  Date  ? HGBA1C 6.8 (H) 03/28/2022  ? ?Good control  ?Glipizide xl 10 mg daily  ?Levemir 65 u daily  ?Trulicity 3.01 mg weekly  ?Taking arb and statin  ?No highs or lows  ? ?Hypothyroidism  ?Pt has no clinical changes ?No change in energy level/ hair or skin/ edema and no tremor ?Lab Results  ?Component Value Date  ? TSH 5.32 03/28/2022  ?  Levothyroxine 50 mcg  ? ?Lab Results  ?Component Value Date  ? CHOL 151 03/28/2022  ? CHOL 147 05/10/2021  ? CHOL 137 10/27/2020  ? ?Lab Results  ?Component Value Date  ? HDL 52.00 03/28/2022  ? HDL 52.30 05/10/2021  ? HDL 53.70 10/27/2020  ? ?Lab Results  ?Component Value Date  ? [place] 60 03/28/2022  ? [place] 73 05/10/2021  ? [place] 68 10/27/2020  ? ?Lab Results  ?Component Value Date  ? TRIG 196.0 (H) 03/28/2022  ? TRIG 110.0 05/10/2021  ? TRIG 78.0 10/27/2020  ? ?Lab Results  ?Component Value Date  ? CHOLHDL 3 03/28/2022  ? CHOLHDL 3 05/10/2021  ? CHOLHDL 3 10/27/2020  ? ?No results found for: LDLDIRECT ?Crestor 5 mg daily ? ?Patient Active Problem List  ? Diagnosis Date Noted  ? Restless legs 04/04/2022  ? S/P TKR (total knee replacement) using cement, right 02/13/2022  ? Current use of  proton pump inhibitor 05/09/2021  ? S/P TKR (total knee replacement) using cement, left 06/21/2020  ? Osteoarthritis 04/28/2020  ? Hyperlipidemia associated with type 2 diabetes mellitus ([place]) 11/02/2018  ? Low back pain 03/21/2018  ? Osteopenia 01/05/2018  ? Arthralgia 07/03/2017  ? Chronic pain of left knee 07/03/2017  ? Psoriasis 11/02/2016  ? Colon cancer screening 11/02/2016  ? Class 2 severe obesity due to excess calories with serious comorbidity and body mass index (BMI) of 36.0 to 36.9 in adult [facility]) 10/31/2015  ? History of breast cancer 10/25/2014  ? Type 2 diabetes mellitus with hyperglycemia ([place]) 07/14/2013  ? Vaginal intraepithelial neoplasia grade 1 07/09/2013  ? Routine general medical examination at a health care facility 10/12/2012  ? Hypothyroidism 06/03/2012  ?  Overactive bladder 05/27/2012  ? HSV infection 07/07/2010  ? LOW BACK PAIN, CHRONIC 05/17/2008  ? HEADACHE, CHRONIC 05/17/2008  ? Essential hypertension 02/12/2008  ? ?Past Medical History:  ?Diagnosis Date  ? Anemia   ? "off and on"  ? Arthritis   ? "right toes" (03/24/2015)  ? Cancer of right breast ([place]) 2015  ? Cyst of cystic duct   ? chest- central location, taking Cephalexin currently  ? Diabetes mellitus without complication ([place])   ? Fibroids   ? hyst. 2006  ? GERD (gastroesophageal reflux disease)   ? Headache   ? History of abnormal Pap smear   ? has had leep in past  ? History of cold sores   ? has used valtrex in past  ? History of kidney stones   ? History of recurrent UTIs   ? Hypertension   ? Hypothyroidism   ? Intermittent vertigo   ? Seborrheic dermatitis   ? local on R scalp and in ear (failed mult meds- uses steroid spray)  ? Thyroid disease   ? ?Past Surgical History:  ?Procedure Laterality Date  ? BREAST RECONSTRUCTION WITH PLACEMENT OF TISSUE EXPANDER AND FLEX HD (ACELLULAR HYDRATED DERMIS) Right 11/25/2014  ? Procedure: RIGHT BREAST RECONSTRUCTION WITH PLACEMENT OF TISSUE EXPANDER AND USE OF ACELLULAR DERMRAL MATRIX ;  Surgeon: Crissie Reese, MD;  Location: [place];  Service: Plastics;  Laterality: Right;  ? BREAST REDUCTION SURGERY Left 03/24/2015  ? Procedure: LEFT BREAST REDUCTION  (BREAST);  Surgeon: Crissie Reese, MD;  Location: [place];  Service: Plastics;  Laterality: Left;  ? BREAST SURGERY Right   ? mastectomy  lymph node removal  ? COLONOSCOPY    ? COLONOSCOPY WITH PROPOFOL N/A 12/28/2016  ? Procedure: COLONOSCOPY WITH PROPOFOL;  Surgeon: Jonathon Bellows, MD;  Location: [facility] ENDOSCOPY;  Service: Endoscopy;  Laterality: N/A;  ? LAPAROSCOPIC CHOLECYSTECTOMY  2003  ? LITHOTRIPSY  ~ 2007  ? REMOVAL OF TISSUE EXPANDER Right 03/24/2015  ? REMOVAL OF TISSUE EXPANDER AND PLACEMENT OF IMPLANT Right 03/24/2015  ? w/delayed construction  ? REMOVAL OF TISSUE EXPANDER AND PLACEMENT OF IMPLANT Right 03/24/2015  ?  Procedure: REMOVAL OF RIGHT BREAST TISSUE EXPANDER AND DELAYED BREAST RECONSTRUCTION WITH PLACEMENT OF IMPLANT;  Surgeon: Crissie Reese, MD;  Location: [place];  Service: Plastics;  Laterality: Right;  ? SIMPLE MASTECTOMY WITH AXILLARY SENTINEL NODE BIOPSY Right 11/25/2014  ? Procedure: RIGHT TOTAL  MASTECTOMY WITH AXILLARY SENTINEL NODE BIOPSY ;  Surgeon: Excell Seltzer, MD;  Location: [place];  Service: General;  Laterality: Right;  ? TOTAL KNEE ARTHROPLASTY Left 06/21/2020  ? Procedure: LEFT TOTAL KNEE ARTHROPLASTY;  Surgeon: Hessie Knows, MD;  Location: [facility];  Service: Orthopedics;  Laterality: Left;  ?  TOTAL KNEE ARTHROPLASTY Right 02/13/2022  ? Procedure: TOTAL KNEE ARTHROPLASTY;  Surgeon: Hessie Knows, MD;  Location: [facility];  Service: Orthopedics;  Laterality: Right;  ? TUBAL LIGATION    ? VAGINAL DELIVERY  x3  ? VAGINAL HYSTERECTOMY  2006  ? "fibroids"  ? ?Social History  ? ?Tobacco Use  ? Smoking status: Never  ? Smokeless tobacco: Never  ?Vaping Use  ? Vaping Use: Never used  ?Substance Use Topics  ? Alcohol use: No  ?  Alcohol/week: 0.0 standard drinks  ? Drug use: No  ? ?Family History  ?Problem Relation Age of Onset  ? Alcohol abuse Father   ? COPD Father   ? Hypertension Father   ? Hypertension Mother   ? Parkinson's disease Mother   ? ?Allergies  ?Allergen Reactions  ? Metformin And Related Diarrhea  ? Norvasc [Amlodipine Besylate] Swelling  ? ?Current Outpatient Medications on File Prior to Visit  ?Medication Sig Dispense Refill  ? alendronate (FOSAMAX) 70 MG tablet TAKE 1 TABLET BY MOUTH EVERY 7 DAYS. TAKE WITH A FULL GLASS OF WATER ON AN EMPTY STOMACH. (Patient taking differently: 70 mg every Saturday.) 12 tablet 1  ? betamethasone dipropionate (DIPROLENE) 0.05 % ointment Apply 1 application topically 2 (two) times daily as needed (ears (psoriasis)).    ? Calcium Carb-Cholecalciferol 600-800 MG-UNIT TABS Take 2 tablets by mouth every evening.    ? cephALEXin (KEFLEX) 500 MG capsule Take 1  capsule (500 mg total) by mouth 2 (two) times daily. 14 capsule 0  ? clobetasol (TEMOVATE) 0.05 % external solution Apply 1 application topically 2 (two) times daily as needed (scalp psoriasis).    ? Dulaglutide (TRULIC

## 2022-04-04 NOTE — Assessment & Plan Note (Signed)
bp in fair control at this time  ?BP Readings from Last 1 Encounters:  ?04/04/22 128/76  ? ?No changes needed ?Most recent labs reviewed  ?Disc lifstyle change with low sodium diet and exercise  ?Plan to continue losartan hct 100-25 mg daily and metoprolol xl 50 mg daily  ?

## 2022-04-04 NOTE — Assessment & Plan Note (Signed)
Continues skyrizi  ?

## 2022-04-04 NOTE — Assessment & Plan Note (Signed)
Nl cbc ? ?

## 2022-04-04 NOTE — Assessment & Plan Note (Signed)
Hypothyroidism  ?Pt has no clinical changes ?No change in energy level/ hair or skin/ edema and no tremor ?Lab Results  ?Component Value Date  ? TSH 5.32 03/28/2022  ?  Plan to continue levothyroxine 50 mcg daily  ?

## 2022-04-04 NOTE — Assessment & Plan Note (Signed)
Discussed how this problem influences overall health and the risks it imposes  Reviewed plan for weight loss with lower calorie diet (via better food choices and also portion control or program like weight watchers) and exercise building up to or more than 30 minutes 5 days per week including some aerobic activity    

## 2022-04-04 NOTE — Assessment & Plan Note (Signed)
Reviewed health habits including diet and exercise and skin cancer prevention ?Reviewed appropriate screening tests for age  ?Also reviewed health mt list, fam hx and immunization status , as well as social and family history   ?See HPI ?Labs reviewed ?Recent eye exam ?Sent to gyn for last pap and mam reports, dexa -is utd per pt  ?Colonoscopy is utd  ?Interested in shingrix if covered  ?

## 2022-04-06 MED ORDER — GABAPENTIN 100 MG PO CAPS: 100.0000 mg | ORAL_CAPSULE | Freq: Every day | ORAL | 3 refills | Status: DC

## 2022-04-14 ENCOUNTER — Other Ambulatory Visit: Payer: Self-pay | Admitting: Family Medicine

## 2022-04-26 MED ORDER — GABAPENTIN 100 MG PO CAPS: 300.0000 mg | ORAL_CAPSULE | Freq: Every day | ORAL | 1 refills | Status: DC

## 2022-04-26 NOTE — Addendum Note (Signed)
Addended by: Loura Pardon A on: 04/26/2022 08:54 AM ? ? Modules accepted: Orders ? ?

## 2022-05-05 ENCOUNTER — Other Ambulatory Visit: Payer: Self-pay | Admitting: Family Medicine

## 2022-05-22 ENCOUNTER — Other Ambulatory Visit: Payer: Self-pay | Admitting: Family Medicine

## 2022-05-28 ENCOUNTER — Encounter: Payer: Self-pay | Admitting: Family Medicine

## 2022-05-29 DIAGNOSIS — Z1231 Encounter for screening mammogram for malignant neoplasm of breast: Secondary | ICD-10-CM | POA: Diagnosis not present

## 2022-05-29 DIAGNOSIS — Z1272 Encounter for screening for malignant neoplasm of vagina: Secondary | ICD-10-CM | POA: Diagnosis not present

## 2022-05-29 DIAGNOSIS — Z01419 Encounter for gynecological examination (general) (routine) without abnormal findings: Secondary | ICD-10-CM | POA: Diagnosis not present

## 2022-05-29 DIAGNOSIS — Z13820 Encounter for screening for osteoporosis: Secondary | ICD-10-CM | POA: Diagnosis not present

## 2022-05-29 DIAGNOSIS — Z6836 Body mass index (BMI) 36.0-36.9, adult: Secondary | ICD-10-CM | POA: Diagnosis not present

## 2022-05-29 DIAGNOSIS — Z1151 Encounter for screening for human papillomavirus (HPV): Secondary | ICD-10-CM | POA: Diagnosis not present

## 2022-05-29 LAB — HM PAP SMEAR: HM Pap smear: NEGATIVE

## 2022-05-29 LAB — HM MAMMOGRAPHY

## 2022-05-29 LAB — HM DEXA SCAN

## 2022-05-29 MED ORDER — GABAPENTIN 300 MG PO CAPS: 300.0000 mg | ORAL_CAPSULE | Freq: Every day | ORAL | 3 refills | Status: DC

## 2022-05-30 ENCOUNTER — Other Ambulatory Visit: Payer: Self-pay | Admitting: Hematology and Oncology

## 2022-06-07 ENCOUNTER — Other Ambulatory Visit: Payer: Self-pay | Admitting: Hematology and Oncology

## 2022-06-19 ENCOUNTER — Other Ambulatory Visit: Payer: Self-pay | Admitting: Family Medicine

## 2022-06-22 ENCOUNTER — Other Ambulatory Visit: Payer: Self-pay | Admitting: Family Medicine

## 2022-06-22 NOTE — Telephone Encounter (Signed)
Refill request Valtrex  Last refill 05/03/22 #10/1 Last office visit 04/04/22

## 2022-07-25 ENCOUNTER — Encounter (INDEPENDENT_AMBULATORY_CARE_PROVIDER_SITE_OTHER): Payer: Self-pay

## 2022-08-04 ENCOUNTER — Other Ambulatory Visit: Payer: Self-pay | Admitting: Family Medicine

## 2022-08-28 ENCOUNTER — Ambulatory Visit (INDEPENDENT_AMBULATORY_CARE_PROVIDER_SITE_OTHER): Payer: BC Managed Care – PPO

## 2022-08-28 ENCOUNTER — Ambulatory Visit (INDEPENDENT_AMBULATORY_CARE_PROVIDER_SITE_OTHER): Payer: BC Managed Care – PPO | Admitting: Podiatry

## 2022-08-28 DIAGNOSIS — M205X1 Other deformities of toe(s) (acquired), right foot: Secondary | ICD-10-CM | POA: Diagnosis not present

## 2022-08-28 NOTE — Progress Notes (Signed)
HPI: 61 y.o. female presenting today for follow-up evaluation of hallux limitus to the right foot.  Patient was originally scheduled for surgery however she states that her A1c levels were elevated and surgery was postponed at that time about 1 year ago.  Since then her A1c levels are within acceptable range and she underwent knee replacement to the right lower extremity April 2023 with good routine healing.  She states that she is now ready to have her joint replacement to the right great toe.  She continues to have pain and tenderness on a daily basis  Past Medical History:  Diagnosis Date   Anemia    "off and on"   Arthritis    "right toes" (03/24/2015)   Cancer of right breast ([place]) 2015   Cyst of cystic duct    chest- central location, taking Cephalexin currently   Diabetes mellitus without complication ([place])    Fibroids    hyst. 2006   GERD (gastroesophageal reflux disease)    Headache    History of abnormal Pap smear    has had leep in past   History of cold sores    has used valtrex in past   History of kidney stones    History of recurrent UTIs    Hypertension    Hypothyroidism    Intermittent vertigo    Seborrheic dermatitis    local on R scalp and in ear (failed mult meds- uses steroid spray)   Thyroid disease      Physical Exam: General: The patient is alert and oriented x3 in no acute distress.  Dermatology: Skin is warm, dry and supple bilateral lower extremities. Negative for open lesions or macerations.  Vascular: Palpable pedal pulses bilaterally. No edema or erythema noted. Capillary refill within normal limits.  Neurological: Epicritic and protective threshold grossly intact bilaterally.   Musculoskeletal Exam:  Muscle strength 5/5 in all groups bilateral.  Limited range of motion with crepitus noted to the first MTPJ right foot.  There is some periarticular bone spurring which is prominent, especially to the medial aspect of the joint.  There continues to  be pain on palpation and range of motion to the joint  Radiographic Exam:  Normal osseous mineralization.  Joint space narrowing with periarticular spurring noted to the first MTPJ of the right foot  Assessment: 1.  Hallux limitus right foot   Plan of Care:  1. Patient evaluated. X-Rays reviewed.  Patient's A1c is now in the acceptable range.  She says that the last A1c was 6.8 2. Today we discussed the conservative versus surgical management of the presenting pathology. The patient opts for surgical management. All possible complications and details of the procedure were explained. All patient questions were answered. No guarantees were expressed or implied. 3. Authorization for surgery was initiated today. Surgery will consist of first MTPJ arthroplasty with implant right 4.  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 Edrick Kins, DPM    [address], [place] [address]  Office [phone]  Fax [phone]

## 2022-09-03 ENCOUNTER — Telehealth: Payer: Self-pay

## 2022-09-03 NOTE — Telephone Encounter (Signed)
Spoke to [place] in reference to her upcoming surgery with Dr. Amalia Hailey on 09/20/2022. She is on Trulicity and was told to hold this medication 7 days prior to surgery. She stated she understood.

## 2022-09-04 ENCOUNTER — Telehealth: Payer: Self-pay | Admitting: Urology

## 2022-09-04 NOTE — Telephone Encounter (Signed)
DOS - 09/20/22  KELLER BUNION IMPLANT RIGHT --- [id number]  [employer] EFFECTIVE DATE - 11/16/21  PLAN DEDUCTIBLE - $2,500.00 W/ $0.00 REMAINING OUT OF POCKET - $7,500.00 W/ $0.00 REMAINING COINSURANCE - 20% COPAY - $0.00   NO PRIOR AUTH REQUIRED.

## 2022-09-20 ENCOUNTER — Other Ambulatory Visit: Payer: Self-pay | Admitting: Podiatry

## 2022-09-20 DIAGNOSIS — M2021 Hallux rigidus, right foot: Secondary | ICD-10-CM | POA: Diagnosis not present

## 2022-09-20 DIAGNOSIS — M205X1 Other deformities of toe(s) (acquired), right foot: Secondary | ICD-10-CM | POA: Diagnosis not present

## 2022-09-20 DIAGNOSIS — E119 Type 2 diabetes mellitus without complications: Secondary | ICD-10-CM | POA: Diagnosis not present

## 2022-09-20 MED ORDER — MELOXICAM 15 MG PO TABS: 15.0000 mg | ORAL_TABLET | Freq: Every day | ORAL | 1 refills | Status: DC

## 2022-09-20 MED ORDER — OXYCODONE-ACETAMINOPHEN 5-325 MG PO TABS
1.0000 | ORAL_TABLET | ORAL | 0 refills | Status: DC | PRN
Start: 2022-09-20 — End: 2023-04-18

## 2022-09-20 NOTE — Progress Notes (Signed)
PRN postop 

## 2022-09-28 ENCOUNTER — Ambulatory Visit (INDEPENDENT_AMBULATORY_CARE_PROVIDER_SITE_OTHER): Payer: BC Managed Care – PPO | Admitting: Podiatry

## 2022-09-28 ENCOUNTER — Ambulatory Visit (INDEPENDENT_AMBULATORY_CARE_PROVIDER_SITE_OTHER): Payer: BC Managed Care – PPO

## 2022-09-28 DIAGNOSIS — Z9889 Other specified postprocedural states: Secondary | ICD-10-CM

## 2022-09-29 NOTE — Progress Notes (Signed)
Chief Complaint  Patient presents with   Post-op Follow-up    POV #1 DOS 09/20/2022 RT GREAT TOE ARTHROPLASTY W/IMPLANT    Subjective:  Patient presents today status post right great toe arthroplasty with implant. DOS: 09/20/2022.  Patient states that she is doing well.  She has kept the dressings clean dry and intact as instructed.  No new complaints at this time  Past Medical History:  Diagnosis Date   Anemia    "off and on"   Arthritis    "right toes" (03/24/2015)   Cancer of right breast ([place]) 2015   Cyst of cystic duct    chest- central location, taking Cephalexin currently   Diabetes mellitus without complication ([place])    Fibroids    hyst. 2006   GERD (gastroesophageal reflux disease)    Headache    History of abnormal Pap smear    has had leep in past   History of cold sores    has used valtrex in past   History of kidney stones    History of recurrent UTIs    Hypertension    Hypothyroidism    Intermittent vertigo    Seborrheic dermatitis    local on R scalp and in ear (failed mult meds- uses steroid spray)   Thyroid disease     Past Surgical History:  Procedure Laterality Date   BREAST RECONSTRUCTION WITH PLACEMENT OF TISSUE EXPANDER AND FLEX HD (ACELLULAR HYDRATED DERMIS) Right 11/25/2014   Procedure: RIGHT BREAST RECONSTRUCTION WITH PLACEMENT OF TISSUE EXPANDER AND USE OF ACELLULAR DERMRAL MATRIX ;  Surgeon: Crissie Reese, MD;  Location: [place];  Service: Plastics;  Laterality: Right;   BREAST REDUCTION SURGERY Left 03/24/2015   Procedure: LEFT BREAST REDUCTION  (BREAST);  Surgeon: Crissie Reese, MD;  Location: [place];  Service: Plastics;  Laterality: Left;   BREAST SURGERY Right    mastectomy  lymph node removal   COLONOSCOPY     COLONOSCOPY WITH PROPOFOL N/A 12/28/2016   Procedure: COLONOSCOPY WITH PROPOFOL;  Surgeon: Jonathon Bellows, MD;  Location: [facility];  Service: Endoscopy;  Laterality: N/A;   LAPAROSCOPIC CHOLECYSTECTOMY  2003   LITHOTRIPSY  ~ 2007    REMOVAL OF TISSUE EXPANDER Right 03/24/2015   REMOVAL OF TISSUE EXPANDER AND PLACEMENT OF IMPLANT Right 03/24/2015   w/delayed construction   REMOVAL OF TISSUE EXPANDER AND PLACEMENT OF IMPLANT Right 03/24/2015   Procedure: REMOVAL OF RIGHT BREAST TISSUE EXPANDER AND DELAYED BREAST RECONSTRUCTION WITH PLACEMENT OF IMPLANT;  Surgeon: Crissie Reese, MD;  Location: [place];  Service: Plastics;  Laterality: Right;   SIMPLE MASTECTOMY WITH AXILLARY SENTINEL NODE BIOPSY Right 11/25/2014   Procedure: RIGHT TOTAL  MASTECTOMY WITH AXILLARY SENTINEL NODE BIOPSY ;  Surgeon: Excell Seltzer, MD;  Location: [place];  Service: General;  Laterality: Right;   TOTAL KNEE ARTHROPLASTY Left 06/21/2020   Procedure: LEFT TOTAL KNEE ARTHROPLASTY;  Surgeon: Hessie Knows, MD;  Location: [facility];  Service: Orthopedics;  Laterality: Left;   TOTAL KNEE ARTHROPLASTY Right 02/13/2022   Procedure: TOTAL KNEE ARTHROPLASTY;  Surgeon: Hessie Knows, MD;  Location: [facility];  Service: Orthopedics;  Laterality: Right;   TUBAL LIGATION     VAGINAL DELIVERY  x3   VAGINAL HYSTERECTOMY  2006   "fibroids"    Allergies  Allergen Reactions   Metformin And Related Diarrhea   Norvasc [Amlodipine Besylate] Swelling    Objective/Physical Exam Neurovascular status intact.  Skin incisions appear to be well coapted with sutures intact. No sign of infectious  process noted. No dehiscence. No active bleeding noted. Moderate edema noted to the surgical extremity.  Radiographic Exam RT foot 09/28/2022:  Arthroplasty noted to the right great toe joint.  Silastic implant intact.  Good alignment of the first ray.  Assessment: 1. s/p right great toe arthroplasty with implant. DOS: 09/20/2022   Plan of Care:  1. Patient was evaluated. X-rays reviewed 2.  Dressings changed.  Patient may begin washing and showering and getting the foot wet 3.  Continue weightbearing in the cam boot 4.  Return to clinic in 1 week for suture removal   Edrick Kins, DPM [facility]  Dr. Edrick Kins, DPM    [address], [place] [address]                Office [phone]  Fax [phone]

## 2022-10-04 ENCOUNTER — Ambulatory Visit (INDEPENDENT_AMBULATORY_CARE_PROVIDER_SITE_OTHER): Payer: BC Managed Care – PPO | Admitting: Family Medicine

## 2022-10-04 ENCOUNTER — Encounter: Payer: Self-pay | Admitting: Family Medicine

## 2022-10-04 VITALS — BP 114/66 | HR 67 | Temp 97.4°F | Ht 63.75 in | Wt 218.1 lb

## 2022-10-04 DIAGNOSIS — E1165 Type 2 diabetes mellitus with hyperglycemia: Secondary | ICD-10-CM | POA: Diagnosis not present

## 2022-10-04 DIAGNOSIS — E1169 Type 2 diabetes mellitus with other specified complication: Secondary | ICD-10-CM

## 2022-10-04 DIAGNOSIS — I10 Essential (primary) hypertension: Secondary | ICD-10-CM | POA: Diagnosis not present

## 2022-10-04 DIAGNOSIS — Z23 Encounter for immunization: Secondary | ICD-10-CM

## 2022-10-04 DIAGNOSIS — E785 Hyperlipidemia, unspecified: Secondary | ICD-10-CM | POA: Diagnosis not present

## 2022-10-04 DIAGNOSIS — Z794 Long term (current) use of insulin: Secondary | ICD-10-CM

## 2022-10-04 LAB — COMPREHENSIVE METABOLIC PANEL
ALT: 22 U/L (ref 0–35)
AST: 21 U/L (ref 0–37)
Albumin: 4 g/dL (ref 3.5–5.2)
Alkaline Phosphatase: 90 U/L (ref 39–117)
BUN: 18 mg/dL (ref 6–23)
CO2: 33 mEq/L — ABNORMAL HIGH (ref 19–32)
Calcium: 9.3 mg/dL (ref 8.4–10.5)
Chloride: 99 mEq/L (ref 96–112)
Creatinine, Ser: 1.1 mg/dL (ref 0.40–1.20)
GFR: 54.86 mL/min — ABNORMAL LOW (ref >60.00)
Glucose, Bld: 176 mg/dL — ABNORMAL HIGH (ref 70–99)
Potassium: 3.7 mEq/L (ref 3.5–5.1)
Sodium: 137 mEq/L (ref 135–145)
Total Bilirubin: 0.6 mg/dL (ref 0.2–1.2)
Total Protein: 6.9 g/dL (ref 6.0–8.3)

## 2022-10-04 LAB — LIPID PANEL
Cholesterol: 144 mg/dL (ref 0–200)
HDL: 53.4 mg/dL (ref >39.00)
LDL Cholesterol: 64 mg/dL (ref 0–99)
NonHDL: 90.13
Total CHOL/HDL Ratio: 3
Triglycerides: 131 mg/dL (ref 0.0–149.0)
VLDL: 26.2 mg/dL (ref 0.0–40.0)

## 2022-10-04 LAB — MICROALBUMIN / CREATININE URINE RATIO
Creatinine,U: 202.9 mg/dL
Microalb Creat Ratio: 0.3 mg/g (ref 0.0–30.0)
Microalb, Ur: <0.7 mg/dL (ref 0.0–1.9)

## 2022-10-04 LAB — HEMOGLOBIN A1C: Hgb A1c MFr Bld: 7.7 % — ABNORMAL HIGH (ref 4.6–6.5)

## 2022-10-04 NOTE — Assessment & Plan Note (Signed)
bp in fair control at this time  BP Readings from Last 1 Encounters:  10/04/22 114/66   No changes needed Most recent labs reviewed  Disc lifstyle change with low sodium diet and exercise  Plan to continue Losartan hct 100-25 mg daily  Metoprolol xl 50 mg daily  Lab ordered today

## 2022-10-04 NOTE — Assessment & Plan Note (Signed)
Disc goals for lipids and reasons to control them Rev last labs with pt Rev low sat fat diet in detail  Lab today  Continues crestor 5 mg daily

## 2022-10-04 NOTE — Patient Instructions (Addendum)
Continue chair exercise  Add some more walking when you are able to  Strength training with weights is helpful also   Flu shot today   Labs today   Think about adding a small bedtime snack with protein or dial back the levemir by 5 more units   Let us know if glucose gets lower   Take care of yourself !  Follow up for annual exam in 6 months

## 2022-10-04 NOTE — Progress Notes (Signed)
Subjective:    Patient ID: Diana Deleon, female    DOB: 08-Aug-1961, 60 y.o.   MRN: [id number]  HPI Pt presents for f/u of DM2 and chronic medical problems   Wt Readings from Last 3 Encounters:  10/04/22 218 lb 2 oz (98.9 kg)  04/04/22 214 lb 2 oz (97.1 kg)  02/13/22 220 lb (99.8 kg)   37.74 kg/m  Just had foot surgery  It went well  Is in a boot - getting used to that   Feeling good  Fair self care   HTN bp is stable today  No cp or palpitations or headaches or edema  No side effects to medicines  BP Readings from Last 3 Encounters:  10/04/22 114/66  04/04/22 128/76  02/14/22 134/81    Losartan hct 100-25 mg daily  Metoprolol xl 50 mg daily   Pulse Readings from Last 3 Encounters:  10/04/22 67  04/04/22 76  02/14/22 62   DM2 Lab Results  Component Value Date   HGBA1C 6.8 (H) 03/28/2022   Due for labs   She feels bad if it glucose goes below 100 (shaky)   Glipizide xl 10 mg daily  Levemir 65 u daily - she cut it back to 55 units due to lower glucose in am  Does not eat a bedtime snack  Trulicity 6.22 mg weekly  On arb and statin   Eye exam : December 2022  No changes in vision   Diet is fair  Not a lot of appetite and also super picky  Tries to eat regularly  Sometimes cereal for dinner   Eggs for protein a lot   Exercise - chair yoga (not now, has boot)  Wants to add some walking    Hyperlipidemia Lab Results  Component Value Date   CHOL 151 03/28/2022   HDL 52.00 03/28/2022   LDLCALC 60 03/28/2022   TRIG 196.0 (H) 03/28/2022   CHOLHDL 3 03/28/2022   Crestor 5 mg daily    Patient Active Problem List   Diagnosis Date Noted   Restless legs 04/04/2022   S/P TKR (total knee replacement) using cement, right 02/13/2022   Current use of proton pump inhibitor 05/09/2021   S/P TKR (total knee replacement) using cement, left 06/21/2020   Osteoarthritis 04/28/2020   Hyperlipidemia associated with type 2 diabetes mellitus ([place]) 11/02/2018    Low back pain 03/21/2018   Osteopenia 01/05/2018   Arthralgia 07/03/2017   Chronic pain of left knee 07/03/2017   Psoriasis 11/02/2016   Colon cancer screening 11/02/2016   Class 2 severe obesity due to excess calories with serious comorbidity and body mass index (BMI) of 36.0 to 36.9 in adult [facility]) 10/31/2015   History of breast cancer 10/25/2014   Type 2 diabetes mellitus with hyperglycemia ([place]) 07/14/2013   Vaginal intraepithelial neoplasia grade 1 07/09/2013   Routine general medical examination at a health care facility 10/12/2012   Hypothyroidism 06/03/2012   Overactive bladder 05/27/2012   HSV infection 07/07/2010   LOW BACK PAIN, CHRONIC 05/17/2008   HEADACHE, CHRONIC 05/17/2008   Essential hypertension 02/12/2008   Past Medical History:  Diagnosis Date   Anemia    "off and on"   Arthritis    "right toes" (03/24/2015)   Cancer of right breast ([place]) 2015   Cyst of cystic duct    chest- central location, taking Cephalexin currently   Diabetes mellitus without complication ([place])    Fibroids    hyst. 2006   GERD (gastroesophageal reflux  disease)    Headache    History of abnormal Pap smear    has had leep in past   History of cold sores    has used valtrex in past   History of kidney stones    History of recurrent UTIs    Hypertension    Hypothyroidism    Intermittent vertigo    Seborrheic dermatitis    local on R scalp and in ear (failed mult meds- uses steroid spray)   Thyroid disease    Past Surgical History:  Procedure Laterality Date   BREAST RECONSTRUCTION WITH PLACEMENT OF TISSUE EXPANDER AND FLEX HD (ACELLULAR HYDRATED DERMIS) Right 11/25/2014   Procedure: RIGHT BREAST RECONSTRUCTION WITH PLACEMENT OF TISSUE EXPANDER AND USE OF ACELLULAR DERMRAL MATRIX ;  Surgeon: Crissie Reese, MD;  Location: [place];  Service: Plastics;  Laterality: Right;   BREAST REDUCTION SURGERY Left 03/24/2015   Procedure: LEFT BREAST REDUCTION  (BREAST);  Surgeon: Crissie Reese, MD;   Location: [place];  Service: Plastics;  Laterality: Left;   BREAST SURGERY Right    mastectomy  lymph node removal   COLONOSCOPY     COLONOSCOPY WITH PROPOFOL N/A 12/28/2016   Procedure: COLONOSCOPY WITH PROPOFOL;  Surgeon: Jonathon Bellows, MD;  Location: [facility];  Service: Endoscopy;  Laterality: N/A;   LAPAROSCOPIC CHOLECYSTECTOMY  2003   LITHOTRIPSY  ~ 2007   REMOVAL OF TISSUE EXPANDER Right 03/24/2015   REMOVAL OF TISSUE EXPANDER AND PLACEMENT OF IMPLANT Right 03/24/2015   w/delayed construction   REMOVAL OF TISSUE EXPANDER AND PLACEMENT OF IMPLANT Right 03/24/2015   Procedure: REMOVAL OF RIGHT BREAST TISSUE EXPANDER AND DELAYED BREAST RECONSTRUCTION WITH PLACEMENT OF IMPLANT;  Surgeon: Crissie Reese, MD;  Location: [place];  Service: Plastics;  Laterality: Right;   SIMPLE MASTECTOMY WITH AXILLARY SENTINEL NODE BIOPSY Right 11/25/2014   Procedure: RIGHT TOTAL  MASTECTOMY WITH AXILLARY SENTINEL NODE BIOPSY ;  Surgeon: Excell Seltzer, MD;  Location: [place];  Service: General;  Laterality: Right;   TOTAL KNEE ARTHROPLASTY Left 06/21/2020   Procedure: LEFT TOTAL KNEE ARTHROPLASTY;  Surgeon: Hessie Knows, MD;  Location: [facility];  Service: Orthopedics;  Laterality: Left;   TOTAL KNEE ARTHROPLASTY Right 02/13/2022   Procedure: TOTAL KNEE ARTHROPLASTY;  Surgeon: Hessie Knows, MD;  Location: [facility];  Service: Orthopedics;  Laterality: Right;   TUBAL LIGATION     VAGINAL DELIVERY  x3   VAGINAL HYSTERECTOMY  2006   "fibroids"   Social History   Tobacco Use   Smoking status: Never   Smokeless tobacco: Never  Vaping Use   Vaping Use: Never used  Substance Use Topics   Alcohol use: No    Alcohol/week: 0.0 standard drinks of alcohol   Drug use: No   Family History  Problem Relation Age of Onset   Alcohol abuse Father    COPD Father    Hypertension Father    Hypertension Mother    Parkinson's disease Mother    Allergies  Allergen Reactions   Metformin And Related Diarrhea   Norvasc  [Amlodipine Besylate] Swelling   Current Outpatient Medications on File Prior to Visit  Medication Sig Dispense Refill   alendronate (FOSAMAX) 70 MG tablet TAKE 1 TABLET BY MOUTH EVERY 7 DAYS. TAKE WITH A FULL GLASS OF WATER ON AN EMPTY STOMACH. (Patient taking differently: 70 mg every Saturday.) 12 tablet 1   betamethasone dipropionate (DIPROLENE) 0.05 % ointment Apply 1 application topically 2 (two) times daily as needed (ears (psoriasis)).  Calcium Carb-Cholecalciferol 600-800 MG-UNIT TABS Take 2 tablets by mouth every evening.     clobetasol (TEMOVATE) 0.05 % external solution Apply 1 application topically 2 (two) times daily as needed (scalp psoriasis).     gabapentin (NEURONTIN) 300 MG capsule Take 1 capsule (300 mg total) by mouth at bedtime. 90 capsule 3   glipiZIDE (GLUCOTROL XL) 10 MG 24 hr tablet TAKE 1 TABLET (10 MG TOTAL) BY MOUTH DAILY WITH BREAKFAST. 90 tablet 1   glucose blood (ONETOUCH VERIO) test strip USE TO CHECK BLOOD SUGAR ONCE DAILY AND AS NEEDED 100 each 2   LEVEMIR FLEXTOUCH 100 UNIT/ML FlexTouch Pen INJECT 65 UNITS INTO SKIN AT BEDTIME 3 mL 5   levothyroxine (SYNTHROID) 50 MCG tablet TAKE 1 TABLET BY MOUTH DAILY BEFORE BREAKFAST 90 tablet 2   losartan-hydrochlorothiazide (HYZAAR) 100-25 MG tablet TAKE 1 TABLET BY MOUTH EVERY DAY 90 tablet 1   meloxicam (MOBIC) 15 MG tablet Take 1 tablet (15 mg total) by mouth daily. 30 tablet 1   metoprolol succinate (TOPROL-XL) 50 MG 24 hr tablet TAKE 1 TABLET BY MOUTH EVERY DAY WITH OR IMMEDIATELY FOLLOWING A MEAL 90 tablet 1   Multiple Vitamin (MULTIVITAMIN WITH MINERALS) TABS tablet Take 1 tablet by mouth every evening.     omeprazole (PRILOSEC) 20 MG capsule TAKE 1 CAPSULE BY MOUTH DAILY BEFORE BREAKFAST 90 capsule 3   ONETOUCH DELICA LANCETS FINE MISC Use to check blood sugar once daily and as needed (dx. E11.9) 100 each 1   oxyCODONE-acetaminophen (PERCOCET) 5-325 MG tablet Take 1 tablet by mouth every 4 (four) hours as needed  for severe pain. 30 tablet 0   Risankizumab-rzaa (SKYRIZI, 150 MG DOSE, [place]) Inject 150 mcg into the skin every 3 (three) months. Take as directed (every 3 month)     rosuvastatin (CRESTOR) 5 MG tablet TAKE 1 TABLET (5 MG TOTAL) BY MOUTH DAILY. 90 tablet 1   TRULICITY 3.14 HF/0.2OV SOPN INJECT 0.75 MG INTO THE SKIN ONCE A WEEK. 0.5 mL 7   valACYclovir (VALTREX) 500 MG tablet TAKE 1 TABLET(500 MG) BY MOUTH TWICE DAILY FOR 5 DAYS FOR OUTBREAK 10 tablet 1   venlafaxine XR (EFFEXOR-XR) 75 MG 24 hr capsule TAKE 1 CAPSULE(75 MG) BY MOUTH DAILY WITH BREAKFAST 90 capsule 3   enoxaparin (LOVENOX) 40 MG/0.4ML injection Inject 0.4 mLs (40 mg total) into the skin daily for 14 days. 5.6 mL 0   No current facility-administered medications on file prior to visit.     Review of Systems  Constitutional:  Negative for activity change, appetite change, fatigue, fever and unexpected weight change.  HENT:  Negative for congestion, ear pain, rhinorrhea, sinus pressure and sore throat.   Eyes:  Negative for pain, redness and visual disturbance.  Respiratory:  Negative for cough, shortness of breath and wheezing.   Cardiovascular:  Negative for chest pain and palpitations.  Gastrointestinal:  Negative for abdominal pain, blood in stool, constipation and diarrhea.  Endocrine: Negative for polydipsia and polyuria.  Genitourinary:  Negative for dysuria, frequency and urgency.  Musculoskeletal:  Negative for arthralgias, back pain and myalgias.  Skin:  Negative for pallor and rash.  Allergic/Immunologic: Negative for environmental allergies.  Neurological:  Negative for dizziness, syncope and headaches.  Hematological:  Negative for adenopathy. Does not bruise/bleed easily.  Psychiatric/Behavioral:  Negative for decreased concentration and dysphoric mood. The patient is not nervous/anxious.        Objective:   Physical Exam Constitutional:      General: She is  not in acute distress.    Appearance: Normal  appearance. She is well-developed. She is obese. She is not ill-appearing or diaphoretic.  HENT:     Head: Normocephalic and atraumatic.     Mouth/Throat:     Mouth: Mucous membranes are moist.  Eyes:     Conjunctiva/sclera: Conjunctivae normal.     Pupils: Pupils are equal, round, and reactive to light.  Neck:     Thyroid: No thyromegaly.     Vascular: No carotid bruit or JVD.  Cardiovascular:     Rate and Rhythm: Normal rate and regular rhythm.     Heart sounds: Normal heart sounds.     No gallop.  Pulmonary:     Effort: Pulmonary effort is normal. No respiratory distress.     Breath sounds: Normal breath sounds. No wheezing or rales.  Abdominal:     General: There is no distension or abdominal bruit.     Palpations: Abdomen is soft.  Musculoskeletal:     Cervical back: Normal range of motion and neck supple.     Right lower leg: No edema.     Left lower leg: No edema.  Lymphadenopathy:     Cervical: No cervical adenopathy.  Skin:    General: Skin is warm and dry.     Coloration: Skin is not pale.     Findings: No rash.  Neurological:     Mental Status: She is alert.     Cranial Nerves: No cranial nerve deficit.     Coordination: Coordination normal.     Deep Tendon Reflexes: Reflexes are normal and symmetric. Reflexes normal.  Psychiatric:        Mood and Affect: Mood normal.           Assessment & Plan:   Problem List Items Addressed This Visit       Cardiovascular and Mediastinum   Essential hypertension    bp in fair control at this time  BP Readings from Last 1 Encounters:  10/04/22 114/66  No changes needed Most recent labs reviewed  Disc lifstyle change with low sodium diet and exercise  Plan to continue Losartan hct 100-25 mg daily  Metoprolol xl 50 mg daily  Lab ordered today      Relevant Orders   Comprehensive metabolic panel     Endocrine   Hyperlipidemia associated with type 2 diabetes mellitus ([place])    Disc goals for lipids and  reasons to control them Rev last labs with pt Rev low sat fat diet in detail  Lab today  Continues crestor 5 mg daily        Relevant Orders   Lipid panel   Type 2 diabetes mellitus with hyperglycemia ([facility]) - Primary    a1c ordered  Overall doing well occ glucose under 100 in am (gets symptomatic even if not under 80) Disc trial of small protein snack at bedtime If not, would consider going down on levemir from 55 to 50 u daily Plan to continue glipizide xl 10 mg daily  trulicity 0.34 mg weekly  On arb and statin  Planning eye exam appt  Good foot care  Flu shot given  Urine microalb ordered       Relevant Orders   Microalbumin / creatinine urine ratio   Hemoglobin A1c   Other Visit Diagnoses     Need for influenza vaccination       Relevant Orders   Flu Vaccine QUAD 6+ mos PF IM (Fluarix Quad PF) (Completed)

## 2022-10-04 NOTE — Assessment & Plan Note (Signed)
a1c ordered  Overall doing well occ glucose under 100 in am (gets symptomatic even if not under 80) Disc trial of small protein snack at bedtime If not, would consider going down on levemir from 55 to 50 u daily Plan to continue glipizide xl 10 mg daily  trulicity 8.97 mg weekly  On arb and statin  Planning eye exam appt  Good foot care  Flu shot given  Urine microalb ordered

## 2022-10-05 ENCOUNTER — Telehealth: Payer: Self-pay | Admitting: *Deleted

## 2022-10-05 ENCOUNTER — Ambulatory Visit (INDEPENDENT_AMBULATORY_CARE_PROVIDER_SITE_OTHER): Payer: BC Managed Care – PPO | Admitting: Podiatry

## 2022-10-05 DIAGNOSIS — Z9889 Other specified postprocedural states: Secondary | ICD-10-CM

## 2022-10-05 NOTE — Progress Notes (Signed)
Chief Complaint  Patient presents with   Post-op Follow-up    Patient is here for post op #2 right foot great toe, patient states that she is healing well.    Subjective:  Patient presents today status post right great toe arthroplasty with implant. DOS: 09/20/2022.  Patient continues to do well.  She is weightbearing as tolerated in the cam boot.  No new complaints at this time  Past Medical History:  Diagnosis Date   Anemia    "off and on"   Arthritis    "right toes" (03/24/2015)   Cancer of right breast ([place]) 2015   Cyst of cystic duct    chest- central location, taking Cephalexin currently   Diabetes mellitus without complication ([place])    Fibroids    hyst. 2006   GERD (gastroesophageal reflux disease)    Headache    History of abnormal Pap smear    has had leep in past   History of cold sores    has used valtrex in past   History of kidney stones    History of recurrent UTIs    Hypertension    Hypothyroidism    Intermittent vertigo    Seborrheic dermatitis    local on R scalp and in ear (failed mult meds- uses steroid spray)   Thyroid disease     Past Surgical History:  Procedure Laterality Date   BREAST RECONSTRUCTION WITH PLACEMENT OF TISSUE EXPANDER AND FLEX HD (ACELLULAR HYDRATED DERMIS) Right 11/25/2014   Procedure: RIGHT BREAST RECONSTRUCTION WITH PLACEMENT OF TISSUE EXPANDER AND USE OF ACELLULAR DERMRAL MATRIX ;  Surgeon: Crissie Reese, MD;  Location: [place];  Service: Plastics;  Laterality: Right;   BREAST REDUCTION SURGERY Left 03/24/2015   Procedure: LEFT BREAST REDUCTION  (BREAST);  Surgeon: Crissie Reese, MD;  Location: [place];  Service: Plastics;  Laterality: Left;   BREAST SURGERY Right    mastectomy  lymph node removal   COLONOSCOPY     COLONOSCOPY WITH PROPOFOL N/A 12/28/2016   Procedure: COLONOSCOPY WITH PROPOFOL;  Surgeon: Jonathon Bellows, MD;  Location: [facility];  Service: Endoscopy;  Laterality: N/A;   LAPAROSCOPIC CHOLECYSTECTOMY  2003    LITHOTRIPSY  ~ 2007   REMOVAL OF TISSUE EXPANDER Right 03/24/2015   REMOVAL OF TISSUE EXPANDER AND PLACEMENT OF IMPLANT Right 03/24/2015   w/delayed construction   REMOVAL OF TISSUE EXPANDER AND PLACEMENT OF IMPLANT Right 03/24/2015   Procedure: REMOVAL OF RIGHT BREAST TISSUE EXPANDER AND DELAYED BREAST RECONSTRUCTION WITH PLACEMENT OF IMPLANT;  Surgeon: Crissie Reese, MD;  Location: [place];  Service: Plastics;  Laterality: Right;   SIMPLE MASTECTOMY WITH AXILLARY SENTINEL NODE BIOPSY Right 11/25/2014   Procedure: RIGHT TOTAL  MASTECTOMY WITH AXILLARY SENTINEL NODE BIOPSY ;  Surgeon: Excell Seltzer, MD;  Location: [place];  Service: General;  Laterality: Right;   TOTAL KNEE ARTHROPLASTY Left 06/21/2020   Procedure: LEFT TOTAL KNEE ARTHROPLASTY;  Surgeon: Hessie Knows, MD;  Location: [facility];  Service: Orthopedics;  Laterality: Left;   TOTAL KNEE ARTHROPLASTY Right 02/13/2022   Procedure: TOTAL KNEE ARTHROPLASTY;  Surgeon: Hessie Knows, MD;  Location: [facility];  Service: Orthopedics;  Laterality: Right;   TUBAL LIGATION     VAGINAL DELIVERY  x3   VAGINAL HYSTERECTOMY  2006   "fibroids"    Allergies  Allergen Reactions   Metformin And Related Diarrhea   Norvasc [Amlodipine Besylate] Swelling    Objective/Physical Exam Neurovascular status intact.  Skin incisions appear to be well coapted with sutures  intact. No sign of infectious process noted. No dehiscence. No active bleeding noted. Moderate edema noted to the surgical extremity.  Radiographic Exam RT foot 09/28/2022:  Arthroplasty noted to the right great toe joint.  Silastic implant intact.  Good alignment of the first ray.  Assessment: 1. s/p right great toe arthroplasty with implant. DOS: 09/20/2022   Plan of Care:  1. Patient was evaluated. 2.  WBAT in the cam boot for an additional 1-2 weeks.  After that she may transition into good supportive tennis shoes and sneakers 3.  Sutures removed 4.  Return to clinic 2 weeks for follow-up  x-ray   Edrick Kins, DPM [facility]  Dr. Edrick Kins, DPM    [address], [place] [address]                Office [phone]  Fax [phone]

## 2022-10-05 NOTE — Telephone Encounter (Signed)
Left VM requesting pt to call the office back 

## 2022-10-05 NOTE — Telephone Encounter (Signed)
-----   Message from Abner Greenspan, MD sent at 10/04/2022  5:17 PM EDT ----- Your a1c is up surprisingly to 7.7  Please watch your diet the best you can  Check glucose twice daily at different times and keep a log  Follow up in 3 months with your blood glucose readings

## 2022-10-11 ENCOUNTER — Encounter: Payer: Self-pay | Admitting: *Deleted

## 2022-10-11 NOTE — Telephone Encounter (Signed)
Addressed through result notes  

## 2022-10-19 ENCOUNTER — Encounter: Payer: BC Managed Care – PPO | Admitting: Podiatry

## 2022-10-26 ENCOUNTER — Ambulatory Visit (INDEPENDENT_AMBULATORY_CARE_PROVIDER_SITE_OTHER): Payer: BC Managed Care – PPO | Admitting: Podiatry

## 2022-10-26 DIAGNOSIS — Z9889 Other specified postprocedural states: Secondary | ICD-10-CM

## 2022-10-26 NOTE — Progress Notes (Signed)
No chief complaint on file.   Subjective:  Patient presents today status post right great toe arthroplasty with implant. DOS: 09/20/2022.  Patient doing very well.  She is walking in regular tennis shoes today.  She is very satisfied with no pain complaints or pain.  Past Medical History:  Diagnosis Date   Anemia    "off and on"   Arthritis    "right toes" (03/24/2015)   Cancer of right breast ([place]) 2015   Cyst of cystic duct    chest- central location, taking Cephalexin currently   Diabetes mellitus without complication ([place])    Fibroids    hyst. 2006   GERD (gastroesophageal reflux disease)    Headache    History of abnormal Pap smear    has had leep in past   History of cold sores    has used valtrex in past   History of kidney stones    History of recurrent UTIs    Hypertension    Hypothyroidism    Intermittent vertigo    Seborrheic dermatitis    local on R scalp and in ear (failed mult meds- uses steroid spray)   Thyroid disease     Past Surgical History:  Procedure Laterality Date   BREAST RECONSTRUCTION WITH PLACEMENT OF TISSUE EXPANDER AND FLEX HD (ACELLULAR HYDRATED DERMIS) Right 11/25/2014   Procedure: RIGHT BREAST RECONSTRUCTION WITH PLACEMENT OF TISSUE EXPANDER AND USE OF ACELLULAR DERMRAL MATRIX ;  Surgeon: Crissie Reese, MD;  Location: [place];  Service: Plastics;  Laterality: Right;   BREAST REDUCTION SURGERY Left 03/24/2015   Procedure: LEFT BREAST REDUCTION  (BREAST);  Surgeon: Crissie Reese, MD;  Location: [place];  Service: Plastics;  Laterality: Left;   BREAST SURGERY Right    mastectomy  lymph node removal   COLONOSCOPY     COLONOSCOPY WITH PROPOFOL N/A 12/28/2016   Procedure: COLONOSCOPY WITH PROPOFOL;  Surgeon: Jonathon Bellows, MD;  Location: [facility];  Service: Endoscopy;  Laterality: N/A;   LAPAROSCOPIC CHOLECYSTECTOMY  2003   LITHOTRIPSY  ~ 2007   REMOVAL OF TISSUE EXPANDER Right 03/24/2015   REMOVAL OF TISSUE EXPANDER AND PLACEMENT OF IMPLANT Right  03/24/2015   w/delayed construction   REMOVAL OF TISSUE EXPANDER AND PLACEMENT OF IMPLANT Right 03/24/2015   Procedure: REMOVAL OF RIGHT BREAST TISSUE EXPANDER AND DELAYED BREAST RECONSTRUCTION WITH PLACEMENT OF IMPLANT;  Surgeon: Crissie Reese, MD;  Location: [place];  Service: Plastics;  Laterality: Right;   SIMPLE MASTECTOMY WITH AXILLARY SENTINEL NODE BIOPSY Right 11/25/2014   Procedure: RIGHT TOTAL  MASTECTOMY WITH AXILLARY SENTINEL NODE BIOPSY ;  Surgeon: Excell Seltzer, MD;  Location: [place];  Service: General;  Laterality: Right;   TOTAL KNEE ARTHROPLASTY Left 06/21/2020   Procedure: LEFT TOTAL KNEE ARTHROPLASTY;  Surgeon: Hessie Knows, MD;  Location: [facility];  Service: Orthopedics;  Laterality: Left;   TOTAL KNEE ARTHROPLASTY Right 02/13/2022   Procedure: TOTAL KNEE ARTHROPLASTY;  Surgeon: Hessie Knows, MD;  Location: [facility];  Service: Orthopedics;  Laterality: Right;   TUBAL LIGATION     VAGINAL DELIVERY  x3   VAGINAL HYSTERECTOMY  2006   "fibroids"    Allergies  Allergen Reactions   Metformin And Related Diarrhea   Norvasc [Amlodipine Besylate] Swelling    Objective/Physical Exam Neurovascular status intact.  Skin incisions nicely healed.  No erythema or edema noted.  Good range of motion of the first MTP without pain  Radiographic Exam RT foot 09/28/2022:  Arthroplasty noted to the right great  toe joint.  Silastic implant intact.  Good alignment of the first ray.  Assessment: 1. s/p right great toe arthroplasty with implant. DOS: 09/20/2022   Plan of Care:  1. Patient was evaluated. 2.  Overall the patient is doing very well.  She is walking in regular tennis shoes with no pain or tenderness.  She has basically resumed her full activity no restrictions daily quality of life with no complaints 3.  Continue to wear good supportive shoes and sneakers 4.  Full activity no restrictions 5.  Return to clinic as needed   Edrick Kins, DPM [facility]  Dr.  Edrick Kins, DPM    [address], [place] [address]                Office [phone]  Fax [phone]

## 2022-11-02 ENCOUNTER — Other Ambulatory Visit: Payer: Self-pay | Admitting: Family Medicine

## 2022-11-03 ENCOUNTER — Other Ambulatory Visit: Payer: Self-pay | Admitting: Family Medicine

## 2022-11-17 ENCOUNTER — Other Ambulatory Visit: Payer: Self-pay | Admitting: Podiatry

## 2022-12-03 DIAGNOSIS — R350 Frequency of micturition: Secondary | ICD-10-CM | POA: Diagnosis not present

## 2022-12-03 DIAGNOSIS — T3695XA Adverse effect of unspecified systemic antibiotic, initial encounter: Secondary | ICD-10-CM | POA: Diagnosis not present

## 2022-12-03 DIAGNOSIS — Z6834 Body mass index (BMI) 34.0-34.9, adult: Secondary | ICD-10-CM | POA: Diagnosis not present

## 2022-12-03 DIAGNOSIS — B379 Candidiasis, unspecified: Secondary | ICD-10-CM | POA: Diagnosis not present

## 2022-12-21 ENCOUNTER — Other Ambulatory Visit: Payer: Self-pay | Admitting: Family Medicine

## 2022-12-24 NOTE — Telephone Encounter (Signed)
F/u was on 10/04/22, last filled on 06/22/22 #10 tabs/ 1 refill

## 2023-01-09 DIAGNOSIS — H35033 Hypertensive retinopathy, bilateral: Secondary | ICD-10-CM | POA: Diagnosis not present

## 2023-01-11 LAB — HM DIABETES EYE EXAM

## 2023-02-06 ENCOUNTER — Other Ambulatory Visit: Payer: Self-pay | Admitting: Family Medicine

## 2023-02-12 ENCOUNTER — Other Ambulatory Visit: Payer: Self-pay | Admitting: Family Medicine

## 2023-02-13 NOTE — Telephone Encounter (Signed)
Last filled on 7/5/213 # 0.5 mL with 7 refills. CPE was 04/04/22

## 2023-02-13 NOTE — Telephone Encounter (Signed)
Due for annual exam in late April with fasting lab prior please

## 2023-02-14 NOTE — Telephone Encounter (Signed)
LVM for patient to c/b and schedule.

## 2023-03-12 DIAGNOSIS — D2262 Melanocytic nevi of left upper limb, including shoulder: Secondary | ICD-10-CM | POA: Diagnosis not present

## 2023-03-12 DIAGNOSIS — D0359 Melanoma in situ of other part of trunk: Secondary | ICD-10-CM | POA: Diagnosis not present

## 2023-03-12 DIAGNOSIS — L570 Actinic keratosis: Secondary | ICD-10-CM | POA: Diagnosis not present

## 2023-03-12 DIAGNOSIS — Z79899 Other long term (current) drug therapy: Secondary | ICD-10-CM | POA: Diagnosis not present

## 2023-03-12 DIAGNOSIS — D485 Neoplasm of uncertain behavior of skin: Secondary | ICD-10-CM | POA: Diagnosis not present

## 2023-03-12 DIAGNOSIS — L400 Psoriasis vulgaris: Secondary | ICD-10-CM | POA: Diagnosis not present

## 2023-03-12 DIAGNOSIS — D225 Melanocytic nevi of trunk: Secondary | ICD-10-CM | POA: Diagnosis not present

## 2023-03-22 DIAGNOSIS — D0359 Melanoma in situ of other part of trunk: Secondary | ICD-10-CM | POA: Diagnosis not present

## 2023-04-08 ENCOUNTER — Telehealth: Payer: Self-pay | Admitting: Family Medicine

## 2023-04-08 DIAGNOSIS — I10 Essential (primary) hypertension: Secondary | ICD-10-CM

## 2023-04-08 DIAGNOSIS — Z79899 Other long term (current) drug therapy: Secondary | ICD-10-CM

## 2023-04-08 DIAGNOSIS — E039 Hypothyroidism, unspecified: Secondary | ICD-10-CM

## 2023-04-08 DIAGNOSIS — E1169 Type 2 diabetes mellitus with other specified complication: Secondary | ICD-10-CM

## 2023-04-08 DIAGNOSIS — E1165 Type 2 diabetes mellitus with hyperglycemia: Secondary | ICD-10-CM

## 2023-04-08 DIAGNOSIS — E559 Vitamin D deficiency, unspecified: Secondary | ICD-10-CM

## 2023-04-08 NOTE — Telephone Encounter (Signed)
-----   Message from Alvina Chou sent at 03/20/2023 10:55 AM EDT ----- Regarding: Lab orders forThursday, 4.25.24 Patient is scheduled for CPX labs, please order future labs, Thanks , Camelia Eng

## 2023-04-11 ENCOUNTER — Other Ambulatory Visit (INDEPENDENT_AMBULATORY_CARE_PROVIDER_SITE_OTHER): Payer: BC Managed Care – PPO

## 2023-04-11 DIAGNOSIS — I10 Essential (primary) hypertension: Secondary | ICD-10-CM

## 2023-04-11 DIAGNOSIS — E1169 Type 2 diabetes mellitus with other specified complication: Secondary | ICD-10-CM

## 2023-04-11 DIAGNOSIS — Z794 Long term (current) use of insulin: Secondary | ICD-10-CM

## 2023-04-11 DIAGNOSIS — E1165 Type 2 diabetes mellitus with hyperglycemia: Secondary | ICD-10-CM | POA: Diagnosis not present

## 2023-04-11 DIAGNOSIS — E559 Vitamin D deficiency, unspecified: Secondary | ICD-10-CM

## 2023-04-11 DIAGNOSIS — E785 Hyperlipidemia, unspecified: Secondary | ICD-10-CM | POA: Diagnosis not present

## 2023-04-11 DIAGNOSIS — E039 Hypothyroidism, unspecified: Secondary | ICD-10-CM | POA: Diagnosis not present

## 2023-04-11 DIAGNOSIS — Z79899 Other long term (current) drug therapy: Secondary | ICD-10-CM

## 2023-04-11 LAB — CBC WITH DIFFERENTIAL/PLATELET
Basophils Absolute: 0 10*3/uL (ref 0.0–0.1)
Basophils Relative: 0.5 % (ref 0.0–3.0)
Eosinophils Absolute: 0.2 10*3/uL (ref 0.0–0.7)
Eosinophils Relative: 2.5 % (ref 0.0–5.0)
HCT: 45 % (ref 36.0–46.0)
Hemoglobin: 15.5 g/dL — ABNORMAL HIGH (ref 12.0–15.0)
Lymphocytes Relative: 20.8 % (ref 12.0–46.0)
Lymphs Abs: 1.3 10*3/uL (ref 0.7–4.0)
MCHC: 34.4 g/dL (ref 30.0–36.0)
MCV: 91.7 fl (ref 78.0–100.0)
Monocytes Absolute: 0.5 10*3/uL (ref 0.1–1.0)
Monocytes Relative: 8.1 % (ref 3.0–12.0)
Neutro Abs: 4.4 10*3/uL (ref 1.4–7.7)
Neutrophils Relative %: 68.1 % (ref 43.0–77.0)
Platelets: 223 10*3/uL (ref 150.0–400.0)
RBC: 4.91 Mil/uL (ref 3.87–5.11)
RDW: 12.9 % (ref 11.5–15.5)
WBC: 6.4 10*3/uL (ref 4.0–10.5)

## 2023-04-11 LAB — COMPREHENSIVE METABOLIC PANEL
ALT: 24 U/L (ref 0–35)
AST: 24 U/L (ref 0–37)
Albumin: 4 g/dL (ref 3.5–5.2)
Alkaline Phosphatase: 83 U/L (ref 39–117)
BUN: 16 mg/dL (ref 6–23)
CO2: 33 mEq/L — ABNORMAL HIGH (ref 19–32)
Calcium: 9.1 mg/dL (ref 8.4–10.5)
Chloride: 97 mEq/L (ref 96–112)
Creatinine, Ser: 1.21 mg/dL — ABNORMAL HIGH (ref 0.40–1.20)
GFR: 48.76 mL/min — ABNORMAL LOW (ref >60.00)
Glucose, Bld: 196 mg/dL — ABNORMAL HIGH (ref 70–99)
Potassium: 3.8 mEq/L (ref 3.5–5.1)
Sodium: 139 mEq/L (ref 135–145)
Total Bilirubin: 1 mg/dL (ref 0.2–1.2)
Total Protein: 6.9 g/dL (ref 6.0–8.3)

## 2023-04-11 LAB — LIPID PANEL
Cholesterol: 139 mg/dL (ref 0–200)
HDL: 51.9 mg/dL (ref >39.00)
LDL Cholesterol: 68 mg/dL (ref 0–99)
NonHDL: 87.02
Total CHOL/HDL Ratio: 3
Triglycerides: 95 mg/dL (ref 0.0–149.0)
VLDL: 19 mg/dL (ref 0.0–40.0)

## 2023-04-11 LAB — TSH: TSH: 2.28 u[IU]/mL (ref 0.35–5.50)

## 2023-04-11 LAB — VITAMIN B12: Vitamin B-12: 327 pg/mL (ref 211–911)

## 2023-04-11 LAB — VITAMIN D 25 HYDROXY (VIT D DEFICIENCY, FRACTURES): VITD: 13.5 ng/mL — ABNORMAL LOW (ref 30.00–100.00)

## 2023-04-11 LAB — HEMOGLOBIN A1C: Hgb A1c MFr Bld: 7.5 % — ABNORMAL HIGH (ref 4.6–6.5)

## 2023-04-15 ENCOUNTER — Other Ambulatory Visit: Payer: Self-pay | Admitting: Family Medicine

## 2023-04-18 ENCOUNTER — Ambulatory Visit (INDEPENDENT_AMBULATORY_CARE_PROVIDER_SITE_OTHER): Payer: BC Managed Care – PPO | Admitting: Family Medicine

## 2023-04-18 ENCOUNTER — Encounter: Payer: Self-pay | Admitting: Family Medicine

## 2023-04-18 VITALS — BP 110/76 | HR 70 | Temp 97.6°F | Ht 63.75 in | Wt 215.2 lb

## 2023-04-18 DIAGNOSIS — E1169 Type 2 diabetes mellitus with other specified complication: Secondary | ICD-10-CM

## 2023-04-18 DIAGNOSIS — Z794 Long term (current) use of insulin: Secondary | ICD-10-CM

## 2023-04-18 DIAGNOSIS — R350 Frequency of micturition: Secondary | ICD-10-CM | POA: Diagnosis not present

## 2023-04-18 DIAGNOSIS — E1165 Type 2 diabetes mellitus with hyperglycemia: Secondary | ICD-10-CM | POA: Diagnosis not present

## 2023-04-18 DIAGNOSIS — Z853 Personal history of malignant neoplasm of breast: Secondary | ICD-10-CM

## 2023-04-18 DIAGNOSIS — E785 Hyperlipidemia, unspecified: Secondary | ICD-10-CM

## 2023-04-18 DIAGNOSIS — Z86006 Personal history of melanoma in-situ: Secondary | ICD-10-CM

## 2023-04-18 DIAGNOSIS — Z0000 Encounter for general adult medical examination without abnormal findings: Secondary | ICD-10-CM

## 2023-04-18 DIAGNOSIS — N1831 Chronic kidney disease, stage 3a: Secondary | ICD-10-CM

## 2023-04-18 DIAGNOSIS — Z79899 Other long term (current) drug therapy: Secondary | ICD-10-CM

## 2023-04-18 DIAGNOSIS — M8589 Other specified disorders of bone density and structure, multiple sites: Secondary | ICD-10-CM

## 2023-04-18 DIAGNOSIS — E559 Vitamin D deficiency, unspecified: Secondary | ICD-10-CM

## 2023-04-18 DIAGNOSIS — E039 Hypothyroidism, unspecified: Secondary | ICD-10-CM

## 2023-04-18 LAB — POC URINALSYSI DIPSTICK (AUTOMATED)
Bilirubin, UA: NEGATIVE
Blood, UA: NEGATIVE
Glucose, UA: POSITIVE — AB
Ketones, UA: NEGATIVE
Leukocytes, UA: NEGATIVE
Nitrite, UA: NEGATIVE
Protein, UA: POSITIVE — AB
Spec Grav, UA: 1.015 (ref 1.010–1.025)
Urobilinogen, UA: 0.2 E.U./dL
pH, UA: 6 (ref 5.0–8.0)

## 2023-04-18 MED ORDER — VENLAFAXINE HCL ER 75 MG PO CP24: ORAL_CAPSULE | ORAL | 3 refills | Status: DC

## 2023-04-18 MED ORDER — TRULICITY 1.5 MG/0.5ML ~~LOC~~ SOAJ: 1.5000 mg | SUBCUTANEOUS | 1 refills | Status: DC

## 2023-04-18 NOTE — Patient Instructions (Addendum)
If you are interested in the shingles vaccine series (Shingrix), call your insurance or pharmacy to check on coverage and location it must be given.  If affordable - you can schedule it here or at your pharmacy depending on coverage    Drink lots of water Do urinalysis on way out   Vitamin D level is low Continue calcium plus D  Continue D3 2000 iu daily   We will watch this    Don't miss doses of medicine   Add some strength training to your routine, this is important for bone and brain health and can reduce your risk of falls and help your body use insulin properly and regulate weight  Light weights, exercise bands , and internet videos are a good way to start  Yoga (chair or regular), machines , floor exercises or a gym with machines are also good options   Go up on trulicity to 1.5 mg weekly Call if any problems  Follow up in 3 months   Set reminders on your phone and set alarms for your medicine

## 2023-04-18 NOTE — Progress Notes (Signed)
Subjective:    Patient ID: Diana Deleon, female    DOB: 01/19/1961, 62 y.o.   MRN: [id number]  HPI Here for health maintenance exam and to review chronic medical problems    Thinks she may have uti  Urgency  Urine smells    Wt Readings from Last 3 Encounters:  04/18/23 215 lb 4 oz (97.6 kg)  10/04/22 218 lb 2 oz (98.9 kg)  04/04/22 214 lb 2 oz (97.1 kg)   37.24 kg/m  Vitals:   04/18/23 0856  BP: 110/76  Pulse: 70  Temp: 97.6 F (36.4 C)  SpO2: 96%   Immunization History  Administered Date(s) Administered   Influenza Split 08/23/2021   Influenza,inj,Quad PF,6+ Mos 10/14/2013, 12/25/2017, 10/31/2018, 12/23/2019, 11/03/2020, 10/04/2022   Influenza-Unspecified 09/29/2014, 10/10/2015, 09/29/2016   PFIZER(Purple Top)SARS-COV-2 Vaccination 03/26/2020, 04/19/2020   Pneumococcal Polysaccharide-23 02/03/2019   Td 06/16/2009   Tdap 02/03/2019   There are no preventive care reminders to display for this patient.  Very busy  Her work place is moving - this has been tough    Wents to get shingrix / needs to fiind out coverage   Mammogram about a year ago at gyn office, has planned  Personal h/o breast cancer  Self breast exam   Eye exam 12/2022  Pap 04/2021  Colonoscopy 12/2016 with 10 y recall  Dexa 12/2017   osteopenia T score -2.3  Taking alendronate - tolerates fine Falls-none Fractures-none  Supplements - taking  Last vitamin D- 2000 iu daily  Also ca and D   Not taking regularly  Lab Results  Component Value Date   VD25OH 13.50 (L) 04/11/2023    Exercise : chair yoga    Mood    04/18/2023    9:17 AM 10/04/2022    8:38 AM 09/13/2021    9:08 AM 02/09/2020    9:16 AM 02/03/2019    9:45 AM  Depression screen PHQ 2/9  Decreased Interest 0 0 0 0 0  Down, Depressed, Hopeless 0 0 0 0 0  PHQ - 2 Score 0 0 0 0 0  Altered sleeping 0 1  0   Tired, decreased energy 1 1  1    Change in appetite 1 1  0   Feeling bad or failure about yourself  0 0  0   Trouble  concentrating 0 0  0   Moving slowly or fidgety/restless 0 0  0   Suicidal thoughts 0 0  0   PHQ-9 Score 2 3  1    Difficult doing work/chores Not difficult at all   Not difficult at all      We need to take over her effexor XR Has helped hot flashes as well as mood   Derm care-yearly  Has psoriasis  Was dx with melanoma in situ?  - removed it all  Stage 0  Using sun protection      HTN bp is stable today  No cp or palpitations or headaches or edema  No side effects to medicines  BP Readings from Last 3 Encounters:  04/18/23 110/76  10/04/22 114/66  04/04/22 128/76    Losartan hct 100-25 mg dialy  Metoprolol xl 50 mg daily   Last metabolic panel Lab Results  Component Value Date   GLUCOSE 196 (H) 04/11/2023   NA 139 04/11/2023   K 3.8 04/11/2023   CL 97 04/11/2023   CO2 33 (H) 04/11/2023   BUN 16 04/11/2023   CREATININE 1.21 (H) 04/11/2023   GFRNONAA >  60 02/14/2022   CALCIUM 9.1 04/11/2023   PHOS 2.6 07/07/2010   PROT 6.9 04/11/2023   ALBUMIN 4.0 04/11/2023   BILITOT 1.0 04/11/2023   ALKPHOS 83 04/11/2023   AST 24 04/11/2023   ALT 24 04/11/2023   ANIONGAP 9 02/14/2022   GFR 48.7  ? Uti    DM2 Lab Results  Component Value Date   HGBA1C 7.5 (H) 04/11/2023  Down from 7.7 Levemir 65 u--misses a lot of doses  Glipizide xl 10 mg daily  Trulicity 0.75 mg weekly  Arb and statin   Lab Results  Component Value Date   MICROALBUR <0.7 10/04/2022       Hypothyroidism  Pt has no clinical changes No change in energy level/ hair or skin/ edema and no tremor Lab Results  Component Value Date   TSH 2.28 04/11/2023     Levothyroxine 50 mcg daily   Hyperlipidemia Lab Results  Component Value Date   CHOL 139 04/11/2023   CHOL 144 10/04/2022   CHOL 151 03/28/2022   Lab Results  Component Value Date   HDL 51.90 04/11/2023   HDL 53.40 10/04/2022   HDL 52.00 03/28/2022   Lab Results  Component Value Date   LDLCALC 68 04/11/2023   LDLCALC 64  10/04/2022   LDLCALC 60 03/28/2022   Lab Results  Component Value Date   TRIG 95.0 04/11/2023   TRIG 131.0 10/04/2022   TRIG 196.0 (H) 03/28/2022   Lab Results  Component Value Date   CHOLHDL 3 04/11/2023   CHOLHDL 3 10/04/2022   CHOLHDL 3 03/28/2022   No results found for: "LDLDIRECT" Crestor 5 mg daily  GERD Omeprazole Lab Results  Component Value Date   VITAMINB12 327 04/11/2023   Patient Active Problem List   Diagnosis Date Noted   Chronic kidney disease (CKD) stage G3a/A1, moderately decreased glomerular filtration rate (GFR) between 45-59 mL/min/1.73 square meter and albuminuria creatinine ratio less than 30 mg/g (HCC) 04/18/2023   History of melanoma in situ 04/18/2023   Urinary frequency 04/18/2023   Vitamin D deficiency 04/08/2023   Restless legs 04/04/2022   S/P TKR (total knee replacement) using cement, right 02/13/2022   Current use of proton pump inhibitor 05/09/2021   S/P TKR (total knee replacement) using cement, left 06/21/2020   Osteoarthritis 04/28/2020   Hyperlipidemia associated with type 2 diabetes mellitus (HCC) 11/02/2018   Low back pain 03/21/2018   Osteopenia 01/05/2018   Arthralgia 07/03/2017   Chronic pain of left knee 07/03/2017   Psoriasis 11/02/2016   Colon cancer screening 11/02/2016   Class 2 obesity due to excess calories with body mass index (BMI) of 37.0 to 37.9 in adult 10/31/2015   History of breast cancer 10/25/2014   Type 2 diabetes mellitus with hyperglycemia (HCC) 07/14/2013   Vaginal intraepithelial neoplasia grade 1 07/09/2013   Routine general medical examination at a health care facility 10/12/2012   Hypothyroidism 06/03/2012   Overactive bladder 05/27/2012   HSV infection 07/07/2010   LOW BACK PAIN, CHRONIC 05/17/2008   HEADACHE, CHRONIC 05/17/2008   Essential hypertension 02/12/2008   Past Medical History:  Diagnosis Date   Anemia    "off and on"   Arthritis    "right toes" (03/24/2015)   Cancer of right breast  (HCC) 2015   Cyst of cystic duct    chest- central location, taking Cephalexin currently   Diabetes mellitus without complication (HCC)    Fibroids    hyst. 2006   GERD (gastroesophageal reflux disease)  Headache    History of abnormal Pap smear    has had leep in past   History of cold sores    has used valtrex in past   History of kidney stones    History of recurrent UTIs    Hypertension    Hypothyroidism    Intermittent vertigo    Seborrheic dermatitis    local on R scalp and in ear (failed mult meds- uses steroid spray)   Thyroid disease    Past Surgical History:  Procedure Laterality Date   BREAST RECONSTRUCTION WITH PLACEMENT OF TISSUE EXPANDER AND FLEX HD (ACELLULAR HYDRATED DERMIS) Right 11/25/2014   Procedure: RIGHT BREAST RECONSTRUCTION WITH PLACEMENT OF TISSUE EXPANDER AND USE OF ACELLULAR DERMRAL MATRIX ;  Surgeon: Etter Sjogren, MD;  Location: [facility] OR;  Service: Plastics;  Laterality: Right;   BREAST REDUCTION SURGERY Left 03/24/2015   Procedure: LEFT BREAST REDUCTION  (BREAST);  Surgeon: Etter Sjogren, MD;  Location: [facility] OR;  Service: Plastics;  Laterality: Left;   BREAST SURGERY Right    mastectomy  lymph node removal   COLONOSCOPY     COLONOSCOPY WITH PROPOFOL N/A 12/28/2016   Procedure: COLONOSCOPY WITH PROPOFOL;  Surgeon: Wyline Mood, MD;  Location: [facility] ENDOSCOPY;  Service: Endoscopy;  Laterality: N/A;   LAPAROSCOPIC CHOLECYSTECTOMY  2003   LITHOTRIPSY  ~ 2007   REMOVAL OF TISSUE EXPANDER Right 03/24/2015   REMOVAL OF TISSUE EXPANDER AND PLACEMENT OF IMPLANT Right 03/24/2015   w/delayed construction   REMOVAL OF TISSUE EXPANDER AND PLACEMENT OF IMPLANT Right 03/24/2015   Procedure: REMOVAL OF RIGHT BREAST TISSUE EXPANDER AND DELAYED BREAST RECONSTRUCTION WITH PLACEMENT OF IMPLANT;  Surgeon: Etter Sjogren, MD;  Location: [facility] OR;  Service: Plastics;  Laterality: Right;   SIMPLE MASTECTOMY WITH AXILLARY SENTINEL NODE BIOPSY Right 11/25/2014   Procedure: RIGHT TOTAL   MASTECTOMY WITH AXILLARY SENTINEL NODE BIOPSY ;  Surgeon: Glenna Fellows, MD;  Location: [facility] OR;  Service: General;  Laterality: Right;   TOTAL KNEE ARTHROPLASTY Left 06/21/2020   Procedure: LEFT TOTAL KNEE ARTHROPLASTY;  Surgeon: Kennedy Bucker, MD;  Location: [facility] ORS;  Service: Orthopedics;  Laterality: Left;   TOTAL KNEE ARTHROPLASTY Right 02/13/2022   Procedure: TOTAL KNEE ARTHROPLASTY;  Surgeon: Kennedy Bucker, MD;  Location: [facility] ORS;  Service: Orthopedics;  Laterality: Right;   TUBAL LIGATION     VAGINAL DELIVERY  x3   VAGINAL HYSTERECTOMY  2006   "fibroids"   Social History   Tobacco Use   Smoking status: Never   Smokeless tobacco: Never  Vaping Use   Vaping Use: Never used  Substance Use Topics   Alcohol use: No    Alcohol/week: 0.0 standard drinks of alcohol   Drug use: No   Family History  Problem Relation Age of Onset   Alcohol abuse Father    COPD Father    Hypertension Father    Hypertension Mother    Parkinson's disease Mother    Allergies  Allergen Reactions   Metformin And Related Diarrhea   Norvasc [Amlodipine Besylate] Swelling   Current Outpatient Medications on File Prior to Visit  Medication Sig Dispense Refill   alendronate (FOSAMAX) 70 MG tablet TAKE 1 TABLET BY MOUTH EVERY 7 DAYS. TAKE WITH A FULL GLASS OF WATER ON AN EMPTY STOMACH. (Patient taking differently: 70 mg every Saturday.) 12 tablet 1   Calcium Carb-Cholecalciferol 600-800 MG-UNIT TABS Take 2 tablets by mouth every evening.     gabapentin (NEURONTIN) 300 MG capsule Take 1 capsule (300 mg  total) by mouth at bedtime. 90 capsule 3   glipiZIDE (GLUCOTROL XL) 10 MG 24 hr tablet TAKE 1 TABLET (10 MG TOTAL) BY MOUTH DAILY WITH BREAKFAST. 90 tablet 1   glucose blood (ONETOUCH VERIO) test strip USE TO CHECK BLOOD SUGAR ONCE DAILY AND AS NEEDED 100 each 2   insulin detemir (LEVEMIR FLEXPEN) 100 UNIT/ML FlexPen INJECT 65 UNITS INTO SKIN AT BEDTIME 15 mL 5   levothyroxine (SYNTHROID) 50 MCG tablet TAKE  1 TABLET BY MOUTH DAILY BEFORE BREAKFAST 90 tablet 2   losartan-hydrochlorothiazide (HYZAAR) 100-25 MG tablet TAKE 1 TABLET BY MOUTH EVERY DAY 90 tablet 1   meloxicam (MOBIC) 7.5 MG tablet Take 7.5 mg by mouth daily as needed for pain.     metoprolol succinate (TOPROL-XL) 50 MG 24 hr tablet TAKE 1 TABLET BY MOUTH EVERY DAY WITH OR IMMEDIATELY FOLLOWING A MEAL 90 tablet 0   Multiple Vitamin (MULTIVITAMIN WITH MINERALS) TABS tablet Take 1 tablet by mouth every evening.     omeprazole (PRILOSEC) 20 MG capsule TAKE 1 CAPSULE BY MOUTH DAILY BEFORE BREAKFAST 90 capsule 3   ONETOUCH DELICA LANCETS FINE MISC Use to check blood sugar once daily and as needed (dx. E11.9) 100 each 1   Risankizumab-rzaa (SKYRIZI, 150 MG DOSE, [place]) Inject 150 mcg into the skin every 3 (three) months. Take as directed (every 3 month)     rosuvastatin (CRESTOR) 5 MG tablet TAKE 1 TABLET (5 MG TOTAL) BY MOUTH DAILY. 90 tablet 0   valACYclovir (VALTREX) 500 MG tablet TAKE 1 TABLET(500 MG) BY MOUTH TWICE DAILY FOR 5 DAYS FOR OUTBREAK 10 tablet 3   No current facility-administered medications on file prior to visit.     Review of Systems  Constitutional:  Negative for activity change, appetite change, fatigue, fever and unexpected weight change.  HENT:  Negative for congestion, ear pain, rhinorrhea, sinus pressure and sore throat.   Eyes:  Negative for pain, redness and visual disturbance.  Respiratory:  Negative for cough, shortness of breath and wheezing.   Cardiovascular:  Negative for chest pain and palpitations.  Gastrointestinal:  Negative for abdominal pain, blood in stool, constipation and diarrhea.  Endocrine: Negative for polydipsia and polyuria.  Genitourinary:  Positive for frequency and urgency. Negative for dysuria.  Musculoskeletal:  Negative for arthralgias, back pain and myalgias.  Skin:  Negative for pallor and rash.  Allergic/Immunologic: Negative for environmental allergies.  Neurological:  Negative for  dizziness, syncope and headaches.  Hematological:  Negative for adenopathy. Does not bruise/bleed easily.  Psychiatric/Behavioral:  Negative for decreased concentration and dysphoric mood. The patient is not nervous/anxious.        Objective:   Physical Exam Constitutional:      General: She is not in acute distress.    Appearance: Normal appearance. She is well-developed. She is not ill-appearing or diaphoretic.  HENT:     Head: Normocephalic and atraumatic.     Right Ear: Tympanic membrane, ear canal and external ear normal.     Left Ear: Tympanic membrane, ear canal and external ear normal.     Nose: Nose normal. No congestion.     Mouth/Throat:     Mouth: Mucous membranes are moist.     Pharynx: Oropharynx is clear. No posterior oropharyngeal erythema.  Eyes:     General: No scleral icterus.    Extraocular Movements: Extraocular movements intact.     Conjunctiva/sclera: Conjunctivae normal.     Pupils: Pupils are equal, round, and reactive to light.  Neck:     Thyroid: No thyromegaly.     Vascular: No carotid bruit or JVD.  Cardiovascular:     Rate and Rhythm: Normal rate and regular rhythm.     Pulses: Normal pulses.     Heart sounds: Normal heart sounds.     No gallop.  Pulmonary:     Effort: Pulmonary effort is normal. No respiratory distress.     Breath sounds: Normal breath sounds. No wheezing.     Comments: Good air exch Chest:     Chest wall: No tenderness.  Abdominal:     General: Bowel sounds are normal. There is no distension or abdominal bruit.     Palpations: Abdomen is soft. There is no mass.     Tenderness: There is no abdominal tenderness.     Hernia: No hernia is present.  Genitourinary:    Comments: Breast and pelvic exam done by gyn  Musculoskeletal:        General: No tenderness. Normal range of motion.     Cervical back: Normal range of motion and neck supple. No rigidity. No muscular tenderness.     Right lower leg: No edema.     Left lower  leg: No edema.     Comments: No kyphosis   Lymphadenopathy:     Cervical: No cervical adenopathy.  Skin:    General: Skin is warm and dry.     Coloration: Skin is not pale.     Findings: No erythema or rash.     Comments: Solar lentigines diffusely   Neurological:     Mental Status: She is alert. Mental status is at baseline.     Cranial Nerves: No cranial nerve deficit.     Motor: No abnormal muscle tone.     Coordination: Coordination normal.     Gait: Gait normal.     Deep Tendon Reflexes: Reflexes are normal and symmetric. Reflexes normal.  Psychiatric:        Mood and Affect: Mood normal.        Cognition and Memory: Cognition and memory normal.           Assessment & Plan:   Problem List Items Addressed This Visit       Endocrine   Hyperlipidemia associated with type 2 diabetes mellitus (HCC)    Disc goals for lipids and reasons to control them Rev last labs with pt Rev low sat fat diet in detail  Stable LDL of 68, at goal Plan to continue crestor 5 mg daily and good diet       Relevant Medications   Dulaglutide (TRULICITY) 1.5 MG/0.5ML SOPN   Hypothyroidism    Hypothyroidism  Pt has no clinical changes No change in energy level/ hair or skin/ edema and no tremor Lab Results  Component Value Date   TSH 2.28 04/11/2023    Plan to continue levothyroxine 50 mcg daily       Type 2 diabetes mellitus with hyperglycemia (HCC)    Lab Results  Component Value Date   HGBA1C 7.5 (H) 04/11/2023  This is down from 7.7  Levemir 65 u daily (misses dose frequently) Glipizide xl 10 mg daily  Trulicity 0.75 mg weekly -will try increase to 1.5 mg and update if side effects or problems  On arb and statin  Normal microalb ratio   Plan to work on med compliance  F/u in a 3 months       Relevant Medications   Dulaglutide (TRULICITY) 1.5 MG/0.5ML  SOPN     Musculoskeletal and Integument   History of melanoma in situ    This was removed entirely  Utd  dermatology care       Osteopenia    Dexa 12/2017 reviewed , stable  Watched by oncology  Tolerating alendronate well No falls or fx  Taking vit D/ level is low and will work on compliance for this  Discussed importance of ca as well as strength building exercise         Genitourinary   Chronic kidney disease (CKD) stage G3a/A1, moderately decreased glomerular filtration rate (GFR) between 45-59 mL/min/1.73 square meter and albuminuria creatinine ratio less than 30 mg/g (HCC)    GFR 48.7 Urine cx negative Discussed improvement of water intake and nsaid avoidance         Other   Current use of proton pump inhibitor    Lab Results  Component Value Date   VITAMINB12 327 04/11/2023  Last vitamin D Lab Results  Component Value Date   VD25OH 13.50 (L) 04/11/2023   Discussed supplementation       History of breast cancer    Continues onc care       Routine general medical examination at a health care facility - Primary    Reviewed health habits including diet and exercise and skin cancer prevention Reviewed appropriate screening tests for age  Also reviewed health mt list, fam hx and immunization status , as well as social and family history   See HPI Labs reviewed and ordered Pt plans to get shingrix at pharmacy at [facility] for last mammogram and pap reports  (personal h/o breast cancer) Eye exam utd Colonoscopy 2018 with 10 y recall  Dexa 2019 with osteopenia, no falls or fractures  PHQ score of 2 due to fatigue  Utd dermatology care (h/o melanoma in situ), using sun protection         Urinary frequency    Some glucose and protein in urine  Culture sent      Relevant Orders   POCT Urinalysis Dipstick (Automated) (Completed)   Vitamin D deficiency    Not controlled Discussed improvement of compliance with vitamin D 3- of 2000 iu daily  Discussed importance to bone and overall health

## 2023-04-19 NOTE — Assessment & Plan Note (Signed)
Hypothyroidism  Pt has no clinical changes No change in energy level/ hair or skin/ edema and no tremor Lab Results  Component Value Date   TSH 2.28 04/11/2023    Plan to continue levothyroxine 50 mcg daily

## 2023-04-19 NOTE — Assessment & Plan Note (Signed)
Continues onc care

## 2023-04-19 NOTE — Assessment & Plan Note (Signed)
Some glucose and protein in urine  Culture sent

## 2023-04-19 NOTE — Assessment & Plan Note (Signed)
Dexa 12/2017 reviewed , stable  Watched by oncology  Tolerating alendronate well No falls or fx  Taking vit D/ level is low and will work on compliance for this  Discussed importance of ca as well as strength building exercise

## 2023-04-19 NOTE — Assessment & Plan Note (Signed)
Not controlled Discussed improvement of compliance with vitamin D 3- of 2000 iu daily  Discussed importance to bone and overall health

## 2023-04-19 NOTE — Assessment & Plan Note (Addendum)
Lab Results  Component Value Date   HGBA1C 7.5 (H) 04/11/2023   This is down from 7.7  Levemir 65 u daily (misses dose frequently) Glipizide xl 10 mg daily  Trulicity 0.75 mg weekly -will try increase to 1.5 mg and update if side effects or problems  On arb and statin  Normal microalb ratio   Plan to work on med compliance  F/u in a 3 months

## 2023-04-19 NOTE — Assessment & Plan Note (Signed)
Lab Results  Component Value Date   VITAMINB12 327 04/11/2023   Last vitamin D Lab Results  Component Value Date   VD25OH 13.50 (L) 04/11/2023    Discussed supplementation

## 2023-04-19 NOTE — Assessment & Plan Note (Signed)
Disc goals for lipids and reasons to control them Rev last labs with pt Rev low sat fat diet in detail  Stable LDL of 68, at goal Plan to continue crestor 5 mg daily and good diet

## 2023-04-19 NOTE — Assessment & Plan Note (Signed)
GFR 48.7 Urine cx negative Discussed improvement of water intake and nsaid avoidance

## 2023-04-19 NOTE — Assessment & Plan Note (Signed)
Reviewed health habits including diet and exercise and skin cancer prevention Reviewed appropriate screening tests for age  Also reviewed health mt list, fam hx and immunization status , as well as social and family history   See HPI Labs reviewed and ordered Pt plans to get shingrix at pharmacy at [facility] for last mammogram and pap reports  (personal h/o breast cancer) Eye exam utd Colonoscopy 2018 with 10 y recall  Dexa 2019 with osteopenia, no falls or fractures  PHQ score of 2 due to fatigue  Utd dermatology care (h/o melanoma in situ), using sun protection

## 2023-04-19 NOTE — Assessment & Plan Note (Signed)
This was removed entirely  Utd dermatology care

## 2023-05-07 ENCOUNTER — Encounter: Payer: Self-pay | Admitting: Family Medicine

## 2023-05-10 ENCOUNTER — Other Ambulatory Visit: Payer: Self-pay | Admitting: Family Medicine

## 2023-06-21 ENCOUNTER — Other Ambulatory Visit: Payer: Self-pay | Admitting: Family Medicine

## 2023-06-24 NOTE — Telephone Encounter (Signed)
Medication: Gabapentin 300 mg  Directions: Take 1 tablet po daily at bedtime Last given: 05/29/22 Number refills: 3 Last o/v: 04/18/23 Follow up: 07/19/23 Labs: 04/11/23

## 2023-07-15 DIAGNOSIS — Z86006 Personal history of melanoma in-situ: Secondary | ICD-10-CM | POA: Diagnosis not present

## 2023-07-15 DIAGNOSIS — D225 Melanocytic nevi of trunk: Secondary | ICD-10-CM | POA: Diagnosis not present

## 2023-07-15 DIAGNOSIS — D2262 Melanocytic nevi of left upper limb, including shoulder: Secondary | ICD-10-CM | POA: Diagnosis not present

## 2023-07-15 DIAGNOSIS — D2261 Melanocytic nevi of right upper limb, including shoulder: Secondary | ICD-10-CM | POA: Diagnosis not present

## 2023-07-19 ENCOUNTER — Encounter: Payer: Self-pay | Admitting: Family Medicine

## 2023-07-19 ENCOUNTER — Ambulatory Visit (INDEPENDENT_AMBULATORY_CARE_PROVIDER_SITE_OTHER): Payer: BC Managed Care – PPO | Admitting: Family Medicine

## 2023-07-19 VITALS — BP 96/66 | HR 82 | Temp 97.6°F | Ht 63.75 in | Wt 209.1 lb

## 2023-07-19 DIAGNOSIS — Z794 Long term (current) use of insulin: Secondary | ICD-10-CM | POA: Diagnosis not present

## 2023-07-19 DIAGNOSIS — I10 Essential (primary) hypertension: Secondary | ICD-10-CM | POA: Diagnosis not present

## 2023-07-19 DIAGNOSIS — E785 Hyperlipidemia, unspecified: Secondary | ICD-10-CM

## 2023-07-19 DIAGNOSIS — E1169 Type 2 diabetes mellitus with other specified complication: Secondary | ICD-10-CM | POA: Diagnosis not present

## 2023-07-19 DIAGNOSIS — E1165 Type 2 diabetes mellitus with hyperglycemia: Secondary | ICD-10-CM

## 2023-07-19 LAB — BASIC METABOLIC PANEL
BUN: 17 mg/dL (ref 6–23)
CO2: 34 mEq/L — ABNORMAL HIGH (ref 19–32)
Calcium: 9.2 mg/dL (ref 8.4–10.5)
Chloride: 95 mEq/L — ABNORMAL LOW (ref 96–112)
Creatinine, Ser: 1.08 mg/dL (ref 0.40–1.20)
GFR: 55.78 mL/min — ABNORMAL LOW (ref >60.00)
Glucose, Bld: 290 mg/dL — ABNORMAL HIGH (ref 70–99)
Potassium: 3.1 mEq/L — ABNORMAL LOW (ref 3.5–5.1)
Sodium: 136 mEq/L (ref 135–145)

## 2023-07-19 LAB — POCT GLYCOSYLATED HEMOGLOBIN (HGB A1C): Hemoglobin A1C: 10.3 % — AB (ref 4.0–5.6)

## 2023-07-19 LAB — HEMOGLOBIN A1C: Hgb A1c MFr Bld: 10.2 % — ABNORMAL HIGH (ref 4.6–6.5)

## 2023-07-19 MED ORDER — TRULICITY 3 MG/0.5ML ~~LOC~~ SOAJ: 3.0000 mg | SUBCUTANEOUS | 0 refills | Status: DC

## 2023-07-19 NOTE — Patient Instructions (Addendum)
Talk to your pharmacist about  1) the leaking from the pen  2) ask if any brand of continuous glucose monitor is covered  Dexcom and freestyle Josephine Igo are 2 examples  Then let me know   Go up to 3 mg weekly for trulicity    If not - please start checking glucose am fasting / 2 hours after a meal and bedtime and keep a log   I want to re check A1c today- not sure if the finger stick is correct  It was up to 10.3   Follow up in 3 months

## 2023-07-19 NOTE — Progress Notes (Unsigned)
Subjective:    Patient ID: Diana Deleon, female    DOB: Dec 16, 1961, 62 y.o.   MRN: [id number]  HPI  Wt Readings from Last 3 Encounters:  07/19/23 209 lb 2 oz (94.9 kg)  04/18/23 215 lb 4 oz (97.6 kg)  10/04/22 218 lb 2 oz (98.9 kg)   36.18 kg/m  Vitals:   07/19/23 0914  BP: 96/66  Pulse: 82  Temp: 97.6 F (36.4 C)  SpO2: 95%    Working on weight loss   Chair yoga - up to 4-5 days per week   Pt presents for follow up of DM2 and chronic health problems     DM2 Lab Results  Component Value Date   HGBA1C 7.5 (H) 04/11/2023   Last time was down from 7.7 to 7.5 Levemir 65 u daily (used to miss doses)-doing better/ not missing doses / feels like some of it drips out  Glipizide xl 10 mg daily  Trulicity was increase to 1.5 mg weekly   Doing best she can with her picky nature Eating a lot of salad  Protein from lean meat   Not taking glucose readings - hard to remember   Had a dexcom for a while - had it for almost a year and then insurance changed and stopped covering it   On arb and statin   Eye exam 12/2022  Lab Results  Component Value Date   MICROALBUR <0.7 10/04/2022    Lab Results  Component Value Date   HGBA1C 10.3 (A) 07/19/2023   Stress- office is moving next week  A lot more stress    Had a few utis  No longer now    HTN bp is stable today  No cp or palpitations or headaches or edema  No side effects to medicines  BP Readings from Last 3 Encounters:  07/19/23 96/66  04/18/23 110/76  10/04/22 114/66     .losartan hct 100-25 mg daily  Metoprolol xl 50 mg daily   Lab Results  Component Value Date   NA 139 04/11/2023   K 3.8 04/11/2023   CO2 33 (H) 04/11/2023   GLUCOSE 196 (H) 04/11/2023   BUN 16 04/11/2023   CREATININE 1.21 (H) 04/11/2023   CALCIUM 9.1 04/11/2023   GFR 48.76 (L) 04/11/2023   EGFR 75 (L) 06/27/2015   GFRNONAA >60 02/14/2022    Hyperlipidemia Lab Results  Component Value Date   CHOL 139 04/11/2023   HDL  51.90 04/11/2023   LDLCALC 68 04/11/2023   TRIG 95.0 04/11/2023   CHOLHDL 3 04/11/2023  Crestor 5 mg daily  At goal      Patient Active Problem List   Diagnosis Date Noted   Chronic kidney disease (CKD) stage G3a/A1, moderately decreased glomerular filtration rate (GFR) between 45-59 mL/min/1.73 square meter and albuminuria creatinine ratio less than 30 mg/g (HCC) 04/18/2023   History of melanoma in situ 04/18/2023   Urinary frequency 04/18/2023   Vitamin D deficiency 04/08/2023   Restless legs 04/04/2022   S/P TKR (total knee replacement) using cement, right 02/13/2022   Current use of proton pump inhibitor 05/09/2021   S/P TKR (total knee replacement) using cement, left 06/21/2020   Osteoarthritis 04/28/2020   Hyperlipidemia associated with type 2 diabetes mellitus (HCC) 11/02/2018   Low back pain 03/21/2018   Osteopenia 01/05/2018   Arthralgia 07/03/2017   Chronic pain of left knee 07/03/2017   Psoriasis 11/02/2016   Colon cancer screening 11/02/2016   Class 2 obesity due to excess  calories with body mass index (BMI) of 37.0 to 37.9 in adult 10/31/2015   History of breast cancer 10/25/2014   Type 2 diabetes mellitus with hyperglycemia (HCC) 07/14/2013   Vaginal intraepithelial neoplasia grade 1 07/09/2013   Routine general medical examination at a health care facility 10/12/2012   Hypothyroidism 06/03/2012   Overactive bladder 05/27/2012   HSV infection 07/07/2010   LOW BACK PAIN, CHRONIC 05/17/2008   HEADACHE, CHRONIC 05/17/2008   Essential hypertension 02/12/2008   Past Medical History:  Diagnosis Date   Anemia    "off and on"   Arthritis    "right toes" (03/24/2015)   Cancer of right breast (HCC) 2015   Cyst of cystic duct    chest- central location, taking Cephalexin currently   Diabetes mellitus without complication (HCC)    Fibroids    hyst. 2006   GERD (gastroesophageal reflux disease)    Headache    History of abnormal Pap smear    has had leep in past    History of cold sores    has used valtrex in past   History of kidney stones    History of recurrent UTIs    Hypertension    Hypothyroidism    Intermittent vertigo    Seborrheic dermatitis    local on R scalp and in ear (failed mult meds- uses steroid spray)   Thyroid disease    Past Surgical History:  Procedure Laterality Date   BREAST RECONSTRUCTION WITH PLACEMENT OF TISSUE EXPANDER AND FLEX HD (ACELLULAR HYDRATED DERMIS) Right 11/25/2014   Procedure: RIGHT BREAST RECONSTRUCTION WITH PLACEMENT OF TISSUE EXPANDER AND USE OF ACELLULAR DERMRAL MATRIX ;  Surgeon: Etter Sjogren, MD;  Location: [facility] OR;  Service: Plastics;  Laterality: Right;   BREAST REDUCTION SURGERY Left 03/24/2015   Procedure: LEFT BREAST REDUCTION  (BREAST);  Surgeon: Etter Sjogren, MD;  Location: [facility] OR;  Service: Plastics;  Laterality: Left;   BREAST SURGERY Right    mastectomy  lymph node removal   COLONOSCOPY     COLONOSCOPY WITH PROPOFOL N/A 12/28/2016   Procedure: COLONOSCOPY WITH PROPOFOL;  Surgeon: Wyline Mood, MD;  Location: [facility] END[facility]COPY;  Service: Endoscopy;  Laterality: N/A;   LAPAROSCOPIC CHOLECYSTECTOMY  2003   LITHOTRIPSY  ~ 2007   REMOVAL OF TISSUE EXPANDER Right 03/24/2015   REMOVAL OF TISSUE EXPANDER AND PLACEMENT OF IMPLANT Right 03/24/2015   w/delayed construction   REMOVAL OF TISSUE EXPANDER AND PLACEMENT OF IMPLANT Right 03/24/2015   Procedure: REMOVAL OF RIGHT BREAST TISSUE EXPANDER AND DELAYED BREAST RECONSTRUCTION WITH PLACEMENT OF IMPLANT;  Surgeon: Etter Sjogren, MD;  Location: [facility] OR;  Service: Plastics;  Laterality: Right;   SIMPLE MASTECTOMY WITH AXILLARY SENTINEL NODE BIOPSY Right 11/25/2014   Procedure: RIGHT TOTAL  MASTECTOMY WITH AXILLARY SENTINEL NODE BIOPSY ;  Surgeon: Glenna Fellows, MD;  Location: [facility] OR;  Service: General;  Laterality: Right;   TOTAL KNEE ARTHROPLASTY Left 06/21/2020   Procedure: LEFT TOTAL KNEE ARTHROPLASTY;  Surgeon: Kennedy Bucker, MD;  Location: [facility] ORS;  Service:  Orthopedics;  Laterality: Left;   TOTAL KNEE ARTHROPLASTY Right 02/13/2022   Procedure: TOTAL KNEE ARTHROPLASTY;  Surgeon: Kennedy Bucker, MD;  Location: [facility] ORS;  Service: Orthopedics;  Laterality: Right;   TUBAL LIGATION     VAGINAL DELIVERY  x3   VAGINAL HYSTERECTOMY  2006   "fibroids"   Social History   Tobacco Use   Smoking status: Never   Smokeless tobacco: Never  Vaping Use   Vaping status: Never Used  Substance Use Topics   Alcohol use: No    Alcohol/week: 0.0 standard drinks of alcohol   Drug use: No   Family History  Problem Relation Age of Onset   Alcohol abuse Father    COPD Father    Hypertension Father    Hypertension Mother    Parkinson's disease Mother    Allergies  Allergen Reactions   Metformin And Related Diarrhea   Norvasc [Amlodipine Besylate] Swelling   Current Outpatient Medications on File Prior to Visit  Medication Sig Dispense Refill   alendronate (FOSAMAX) 70 MG tablet TAKE 1 TABLET BY MOUTH EVERY 7 DAYS. TAKE WITH A FULL GLASS OF WATER ON AN EMPTY STOMACH. (Patient taking differently: 70 mg every Saturday.) 12 tablet 1   Calcium Carb-Cholecalciferol 600-800 MG-UNIT TABS Take 2 tablets by mouth every evening.     Dulaglutide (TRULICITY) 1.5 MG/0.5ML SOPN Inject 1.5 mg into the skin once a week. 6 mL 1   gabapentin (NEURONTIN) 300 MG capsule TAKE 1 CAPSULE BY MOUTH EVERYDAY AT BEDTIME 90 capsule 0   glipiZIDE (GLUCOTROL XL) 10 MG 24 hr tablet TAKE 1 TABLET (10 MG TOTAL) BY MOUTH DAILY WITH BREAKFAST. 90 tablet 0   glucose blood (ONETOUCH VERIO) test strip USE TO CHECK BLOOD SUGAR ONCE DAILY AND AS NEEDED 100 each 2   insulin detemir (LEVEMIR FLEXPEN) 100 UNIT/ML FlexPen INJECT 65 UNITS INTO SKIN AT BEDTIME 15 mL 5   levothyroxine (SYNTHROID) 50 MCG tablet TAKE 1 TABLET BY MOUTH DAILY BEFORE BREAKFAST 90 tablet 2   losartan-hydrochlorothiazide (HYZAAR) 100-25 MG tablet TAKE 1 TABLET BY MOUTH EVERY DAY 90 tablet 0   meloxicam (MOBIC) 7.5 MG tablet  Take 7.5 mg by mouth daily as needed for pain.     metoprolol succinate (TOPROL-XL) 50 MG 24 hr tablet TAKE 1 TABLET BY MOUTH EVERY DAY WITH OR IMMEDIATELY FOLLOWING A MEAL 90 tablet 0   Multiple Vitamin (MULTIVITAMIN WITH MINERALS) TABS tablet Take 1 tablet by mouth every evening.     omeprazole (PRILOSEC) 20 MG capsule TAKE 1 CAPSULE BY MOUTH DAILY BEFORE BREAKFAST 90 capsule 3   ONETOUCH DELICA LANCETS FINE MISC Use to check blood sugar once daily and as needed (dx. E11.9) 100 each 1   Risankizumab-rzaa (SKYRIZI, 150 MG DOSE, [place]) Inject 150 mcg into the skin every 3 (three) months. Take as directed (every 3 month)     rosuvastatin (CRESTOR) 5 MG tablet TAKE 1 TABLET (5 MG TOTAL) BY MOUTH DAILY. 90 tablet 0   valACYclovir (VALTREX) 500 MG tablet TAKE 1 TABLET(500 MG) BY MOUTH TWICE DAILY FOR 5 DAYS FOR OUTBREAK 10 tablet 3   venlafaxine XR (EFFEXOR-XR) 75 MG 24 hr capsule TAKE 1 CAPSULE(75 MG) BY MOUTH DAILY WITH BREAKFAST 90 capsule 3   No current facility-administered medications on file prior to visit.    Review of Systems     Objective:   Physical Exam        Assessment & Plan:   Problem List Items Addressed This Visit       Endocrine   Type 2 diabetes mellitus with hyperglycemia (HCC) - Primary   Relevant Orders   POCT glycosylated hemoglobin (Hb A1C)

## 2023-07-20 NOTE — Assessment & Plan Note (Signed)
Surprisingly A1c is up to 10.3  (poct) - this does not seem to correlate with home readings in the setting of better diet and increased trulicity dose We choose to repeat this with a blood draw today   Picky eater-again reviewed low glycemic choices May qualify for another CGM with this insurance- she will check with pharmacy and let us know  Eye exam utd  Microalb utd   Continues  Levemir 65 u daily (had a drip from pen, will d/w pharmacy also) Glipizide xl 10 mg daily  Trulicity-will increase to 3 mg weekly if well tolerated   Stress has been higher  -discussed its effect on DM  On arb and statin  Pending blood draw A1c  Follow up in 3 months  Discussed exercise options

## 2023-07-20 NOTE — Assessment & Plan Note (Signed)
bp in fair control at this time  BP Readings from Last 1 Encounters:  07/19/23 96/66   No changes needed Most recent labs reviewed  Disc lifstyle change with low sodium diet and exercise  Plan to continue Losartan hct 100-25 mg daily  Metoprolol xl 50 mg daily

## 2023-07-20 NOTE — Assessment & Plan Note (Signed)
Disc goals for lipids and reasons to control them Rev last labs with pt Rev low sat fat diet in detail  Stable LDL of 68, at goal Plan to continue crestor 5 mg daily and good diet

## 2023-07-25 ENCOUNTER — Other Ambulatory Visit: Payer: Self-pay

## 2023-07-25 MED ORDER — POTASSIUM CHLORIDE ER 10 MEQ PO TBCR: 10.0000 meq | EXTENDED_RELEASE_TABLET | Freq: Every day | ORAL | 1 refills | Status: DC

## 2023-08-02 DIAGNOSIS — Z1151 Encounter for screening for human papillomavirus (HPV): Secondary | ICD-10-CM | POA: Diagnosis not present

## 2023-08-02 DIAGNOSIS — Z6836 Body mass index (BMI) 36.0-36.9, adult: Secondary | ICD-10-CM | POA: Diagnosis not present

## 2023-08-02 DIAGNOSIS — Z1231 Encounter for screening mammogram for malignant neoplasm of breast: Secondary | ICD-10-CM | POA: Diagnosis not present

## 2023-08-02 DIAGNOSIS — Z1272 Encounter for screening for malignant neoplasm of vagina: Secondary | ICD-10-CM | POA: Diagnosis not present

## 2023-08-02 DIAGNOSIS — Z01419 Encounter for gynecological examination (general) (routine) without abnormal findings: Secondary | ICD-10-CM | POA: Diagnosis not present

## 2023-08-02 LAB — HM PAP SMEAR: HM Pap smear: NEGATIVE

## 2023-08-02 LAB — HM MAMMOGRAPHY

## 2023-08-04 ENCOUNTER — Other Ambulatory Visit: Payer: Self-pay | Admitting: Family Medicine

## 2023-08-08 ENCOUNTER — Other Ambulatory Visit: Payer: Self-pay | Admitting: Family Medicine

## 2023-08-08 ENCOUNTER — Other Ambulatory Visit (INDEPENDENT_AMBULATORY_CARE_PROVIDER_SITE_OTHER): Payer: BC Managed Care – PPO

## 2023-08-08 DIAGNOSIS — I10 Essential (primary) hypertension: Secondary | ICD-10-CM

## 2023-08-08 LAB — BASIC METABOLIC PANEL
BUN: 13 mg/dL (ref 6–23)
CO2: 33 mEq/L — ABNORMAL HIGH (ref 19–32)
Calcium: 9.4 mg/dL (ref 8.4–10.5)
Chloride: 97 mEq/L (ref 96–112)
Creatinine, Ser: 1.04 mg/dL (ref 0.40–1.20)
GFR: 58.34 mL/min — ABNORMAL LOW (ref >60.00)
Glucose, Bld: 252 mg/dL — ABNORMAL HIGH (ref 70–99)
Potassium: 3.7 mEq/L (ref 3.5–5.1)
Sodium: 138 mEq/L (ref 135–145)

## 2023-08-09 ENCOUNTER — Encounter: Payer: Self-pay | Admitting: *Deleted

## 2023-08-12 ENCOUNTER — Telehealth: Payer: Self-pay | Admitting: *Deleted

## 2023-08-12 ENCOUNTER — Other Ambulatory Visit: Payer: Self-pay | Admitting: Family Medicine

## 2023-08-12 ENCOUNTER — Encounter: Payer: Self-pay | Admitting: Family Medicine

## 2023-08-12 DIAGNOSIS — E119 Type 2 diabetes mellitus without complications: Secondary | ICD-10-CM | POA: Insufficient documentation

## 2023-08-12 DIAGNOSIS — Z794 Long term (current) use of insulin: Secondary | ICD-10-CM

## 2023-08-12 NOTE — Telephone Encounter (Signed)
Pt sent a message saying:  It looks like the Dexcom G6 system is covered by my insurance. Please confirm if you are going to send in a prescription.

## 2023-08-12 NOTE — Telephone Encounter (Signed)
Does she need the reciever and sensors?  Let me know what she needs and what pharmacy' Thanks

## 2023-08-13 ENCOUNTER — Other Ambulatory Visit: Payer: Self-pay | Admitting: Family Medicine

## 2023-08-13 DIAGNOSIS — Z794 Long term (current) use of insulin: Secondary | ICD-10-CM

## 2023-08-13 DIAGNOSIS — E119 Type 2 diabetes mellitus without complications: Secondary | ICD-10-CM

## 2023-08-13 MED ORDER — DEXCOM G6 TRANSMITTER MISC
3 refills | Status: AC
Start: 2023-08-13 — End: ?

## 2023-08-13 MED ORDER — DEXCOM G6 RECEIVER DEVI
0 refills | Status: AC
Start: 2023-08-13 — End: ?

## 2023-08-13 MED ORDER — DEXCOM G6 SENSOR MISC
1.0000 | 11 refills | Status: DC
Start: 2023-08-13 — End: 2024-09-25

## 2023-08-13 NOTE — Telephone Encounter (Signed)
Sent mychart asking pt this info

## 2023-10-10 DIAGNOSIS — Z23 Encounter for immunization: Secondary | ICD-10-CM | POA: Diagnosis not present

## 2023-10-20 ENCOUNTER — Other Ambulatory Visit: Payer: Self-pay | Admitting: Family Medicine

## 2023-10-21 ENCOUNTER — Encounter: Payer: Self-pay | Admitting: Family Medicine

## 2023-10-21 ENCOUNTER — Ambulatory Visit (INDEPENDENT_AMBULATORY_CARE_PROVIDER_SITE_OTHER): Payer: BC Managed Care – PPO | Admitting: Family Medicine

## 2023-10-21 VITALS — BP 124/78 | HR 106 | Temp 98.4°F | Ht 63.75 in | Wt 212.1 lb

## 2023-10-21 DIAGNOSIS — I10 Essential (primary) hypertension: Secondary | ICD-10-CM | POA: Diagnosis not present

## 2023-10-21 DIAGNOSIS — Z794 Long term (current) use of insulin: Secondary | ICD-10-CM | POA: Diagnosis not present

## 2023-10-21 DIAGNOSIS — E1169 Type 2 diabetes mellitus with other specified complication: Secondary | ICD-10-CM

## 2023-10-21 DIAGNOSIS — E785 Hyperlipidemia, unspecified: Secondary | ICD-10-CM

## 2023-10-21 DIAGNOSIS — E1165 Type 2 diabetes mellitus with hyperglycemia: Secondary | ICD-10-CM | POA: Diagnosis not present

## 2023-10-21 LAB — MICROALBUMIN / CREATININE URINE RATIO
Creatinine,U: 186.7 mg/dL
Microalb Creat Ratio: 0.4 mg/g (ref 0.0–30.0)
Microalb, Ur: <0.7 mg/dL (ref 0.0–1.9)

## 2023-10-21 LAB — POCT GLYCOSYLATED HEMOGLOBIN (HGB A1C): Hemoglobin A1C: 7.9 % — AB (ref 4.0–5.6)

## 2023-10-21 MED ORDER — TRULICITY 4.5 MG/0.5ML ~~LOC~~ SOAJ
4.5000 mg | SUBCUTANEOUS | 0 refills | Status: DC
Start: 2023-10-21 — End: 2023-11-25

## 2023-10-21 NOTE — Progress Notes (Signed)
Subjective:    Patient ID: Rhiana Morash, female    DOB: 21-May-1961, 62 y.o.   MRN: [id number]  HPI  Wt Readings from Last 3 Encounters:  10/21/23 212 lb 2 oz (96.2 kg)  07/19/23 209 lb 2 oz (94.9 kg)  04/18/23 215 lb 4 oz (97.6 kg)   36.70 kg/m  Vitals:   10/21/23 0825  BP: 124/78  Pulse: (!) 106  Temp: 98.4 F (36.9 C)  SpO2: 95%   Pt presents for DM2 and chronic medical problems   HTN bp is stable today  No cp or palpitations or headaches or edema  No side effects to medicines  BP Readings from Last 3 Encounters:  10/21/23 124/78  07/19/23 96/66  04/18/23 110/76    Losartan hct 100-25 mg daily  Metoprolol xl 50 mg daily   DM2 Lab Results  Component Value Date   HGBA1C 7.9 (A) 10/21/2023   Today down to 7.9 Now using a CGM  Overall - trends around 140 in the am  Highest at night  No issues in the middle of the night    Picky eater  She eats meat and potato  Cannot do sweet potatoes  Very few veggies -salads / cucumbers Dewayne Shorter  Does eat fruit   Occational sweets- at a celebration    Levemir 65 u daily Glipizide xl 10 mg daily  Trulicity 3 mg weekly  Trying to eat breakfast   Due for microalb   Hyperlipidemia Lab Results  Component Value Date   CHOL 139 04/11/2023   HDL 51.90 04/11/2023   LDLCALC 68 04/11/2023   TRIG 95.0 04/11/2023   CHOLHDL 3 04/11/2023   Crestor 5 mg daily   Patient Active Problem List   Diagnosis Date Noted   Diabetes mellitus type 2, insulin dependent (HCC) 08/12/2023   Chronic kidney disease (CKD) stage G3a/A1, moderately decreased glomerular filtration rate (GFR) between 45-59 mL/min/1.73 square meter and albuminuria creatinine ratio less than 30 mg/g (HCC) 04/18/2023   History of melanoma in situ 04/18/2023   Urinary frequency 04/18/2023   Vitamin D deficiency 04/08/2023   Restless legs 04/04/2022   S/P TKR (total knee replacement) using cement, right 02/13/2022   Current use of proton pump inhibitor  05/09/2021   S/P TKR (total knee replacement) using cement, left 06/21/2020   Osteoarthritis 04/28/2020   Hyperlipidemia associated with type 2 diabetes mellitus (HCC) 11/02/2018   Low back pain 03/21/2018   Osteopenia 01/05/2018   Arthralgia 07/03/2017   Chronic pain of left knee 07/03/2017   Psoriasis 11/02/2016   Colon cancer screening 11/02/2016   Class 2 obesity due to excess calories with body mass index (BMI) of 37.0 to 37.9 in adult 10/31/2015   History of breast cancer 10/25/2014   Type 2 diabetes mellitus with hyperglycemia (HCC) 07/14/2013   Vaginal intraepithelial neoplasia grade 1 07/09/2013   Routine general medical examination at a health care facility 10/12/2012   Hypothyroidism 06/03/2012   Overactive bladder 05/27/2012   HSV infection 07/07/2010   LOW BACK PAIN, CHRONIC 05/17/2008   Headache 05/17/2008   Essential hypertension 02/12/2008   Past Medical History:  Diagnosis Date   Anemia    "off and on"   Arthritis    "right toes" (03/24/2015)   Cancer of right breast (HCC) 2015   Cyst of cystic duct    chest- central location, taking Cephalexin currently   Diabetes mellitus without complication (HCC)    Fibroids    hyst. 2006   GERD (  gastroesophageal reflux disease)    Headache    History of abnormal Pap smear    has had leep in past   History of cold sores    has used valtrex in past   History of kidney stones    History of recurrent UTIs    Hypertension    Hypothyroidism    Intermittent vertigo    Seborrheic dermatitis    local on R scalp and in ear (failed mult meds- uses steroid spray)   Thyroid disease    Past Surgical History:  Procedure Laterality Date   BREAST RECONSTRUCTION WITH PLACEMENT OF TISSUE EXPANDER AND FLEX HD (ACELLULAR HYDRATED DERMIS) Right 11/25/2014   Procedure: RIGHT BREAST RECONSTRUCTION WITH PLACEMENT OF TISSUE EXPANDER AND USE OF ACELLULAR DERMRAL MATRIX ;  Surgeon: Etter Sjogren, MD;  Location: [facility] OR;  Service: Plastics;   Laterality: Right;   BREAST REDUCTION SURGERY Left 03/24/2015   Procedure: LEFT BREAST REDUCTION  (BREAST);  Surgeon: Etter Sjogren, MD;  Location: [facility] OR;  Service: Plastics;  Laterality: Left;   BREAST SURGERY Right    mastectomy  lymph node removal   COLONOSCOPY     COLONOSCOPY WITH PROPOFOL N/A 12/28/2016   Procedure: COLONOSCOPY WITH PROPOFOL;  Surgeon: Wyline Mood, MD;  Location: [facility] END[facility];  Service: Endoscopy;  Laterality: N/A;   LAPAROSCOPIC CHOLECYSTECTOMY  2003   LITHOTRIPSY  ~ 2007   REMOVAL OF TISSUE EXPANDER Right 03/24/2015   REMOVAL OF TISSUE EXPANDER AND PLACEMENT OF IMPLANT Right 03/24/2015   w/delayed construction   REMOVAL OF TISSUE EXPANDER AND PLACEMENT OF IMPLANT Right 03/24/2015   Procedure: REMOVAL OF RIGHT BREAST TISSUE EXPANDER AND DELAYED BREAST RECONSTRUCTION WITH PLACEMENT OF IMPLANT;  Surgeon: Etter Sjogren, MD;  Location: [facility] OR;  Service: Plastics;  Laterality: Right;   SIMPLE MASTECTOMY WITH AXILLARY SENTINEL NODE BIOPSY Right 11/25/2014   Procedure: RIGHT TOTAL  MASTECTOMY WITH AXILLARY SENTINEL NODE BIOPSY ;  Surgeon: Glenna Fellows, MD;  Location: [facility] OR;  Service: General;  Laterality: Right;   TOTAL KNEE ARTHROPLASTY Left 06/21/2020   Procedure: LEFT TOTAL KNEE ARTHROPLASTY;  Surgeon: Kennedy Bucker, MD;  Location: [facility] OR[facility];  Service: Orthopedics;  Laterality: Left;   TOTAL KNEE ARTHROPLASTY Right 02/13/2022   Procedure: TOTAL KNEE ARTHROPLASTY;  Surgeon: Kennedy Bucker, MD;  Location: [facility] ORS;  Service: Orthopedics;  Laterality: Right;   TUBAL LIGATION     VAGINAL DELIVERY  x3   VAGINAL HYSTERECTOMY  2006   "fibroids"   Social History   Tobacco Use   Smoking status: Never   Smokeless tobacco: Never  Vaping Use   Vaping status: Never Used  Substance Use Topics   Alcohol use: No    Alcohol/week: 0.0 standard drinks of alcohol   Drug use: No   Family History  Problem Relation Age of Onset   Alcohol abuse Father    COPD Father    Hypertension Father     Hypertension Mother    Parkinson's disease Mother    Allergies  Allergen Reactions   Metformin And Related Diarrhea   Norvasc [Amlodipine Besylate] Swelling   Current Outpatient Medications on File Prior to Visit  Medication Sig Dispense Refill   Calcium Carb-Cholecalciferol 600-800 MG-UNIT TABS Take 2 tablets by mouth every evening.     Continuous Glucose Receiver (DEXCOM G6 RECEIVER) DEVI Use as directed for diabetes 1 each 0   Continuous Glucose Sensor (DEXCOM G6 SENSOR) MISC 1 each by Does not apply route as directed. 3 each 11   Continuous  Glucose Transmitter (DEXCOM G6 TRANSMITTER) MISC Use as directed for diabetes 1 each 3   gabapentin (NEURONTIN) 300 MG capsule TAKE 1 CAPSULE BY MOUTH EVERYDAY AT BEDTIME 90 capsule 0   glipiZIDE (GLUCOTROL XL) 10 MG 24 hr tablet TAKE 1 TABLET (10 MG TOTAL) BY MOUTH DAILY WITH BREAKFAST. 90 tablet 2   glucose blood (ONETOUCH VERIO) test strip USE TO CHECK BLOOD SUGAR ONCE DAILY AND AS NEEDED 100 each 2   insulin detemir (LEVEMIR FLEXPEN) 100 UNIT/ML FlexPen INJECT 65 UNITS INTO SKIN AT BEDTIME 15 mL 5   levothyroxine (SYNTHROID) 50 MCG tablet TAKE 1 TABLET BY MOUTH EVERY DAY BEFORE BREAKFAST 90 tablet 2   losartan-hydrochlorothiazide (HYZAAR) 100-25 MG tablet TAKE 1 TABLET BY MOUTH EVERY DAY 90 tablet 0   metoprolol succinate (TOPROL-XL) 50 MG 24 hr tablet TAKE 1 TABLET BY MOUTH EVERY DAY WITH OR IMMEDIATELY FOLLOWING A MEAL 90 tablet 0   Multiple Vitamin (MULTIVITAMIN WITH MINERALS) TABS tablet Take 1 tablet by mouth every evening.     omeprazole (PRILOSEC) 20 MG capsule TAKE 1 CAPSULE BY MOUTH DAILY BEFORE BREAKFAST 90 capsule 3   ONETOUCH DELICA LANCETS FINE MISC Use to check blood sugar once daily and as needed (dx. E11.9) 100 each 1   potassium chloride (KLOR-CON 10) 10 MEQ tablet Take 1 tablet (10 mEq total) by mouth daily. 90 tablet 1   Risankizumab-rzaa (SKYRIZI, 150 MG DOSE, [place]) Inject 150 mcg into the skin every 3 (three) months. Take as  directed (every 3 month)     rosuvastatin (CRESTOR) 5 MG tablet TAKE 1 TABLET (5 MG TOTAL) BY MOUTH DAILY. 90 tablet 0   valACYclovir (VALTREX) 500 MG tablet TAKE 1 TABLET(500 MG) BY MOUTH TWICE DAILY FOR 5 DAYS FOR OUTBREAK 10 tablet 3   venlafaxine XR (EFFEXOR-XR) 75 MG 24 hr capsule TAKE 1 CAPSULE(75 MG) BY MOUTH DAILY WITH BREAKFAST 90 capsule 3   No current facility-administered medications on file prior to visit.    Review of Systems  Constitutional:  Negative for activity change, appetite change, fatigue, fever and unexpected weight change.       Picky eater Dislikes textures   HENT:  Negative for congestion, ear pain, rhinorrhea, sinus pressure and sore throat.   Eyes:  Negative for pain, redness and visual disturbance.  Respiratory:  Negative for cough, shortness of breath and wheezing.   Cardiovascular:  Negative for chest pain and palpitations.  Gastrointestinal:  Negative for abdominal pain, blood in stool, constipation and diarrhea.  Endocrine: Negative for polydipsia and polyuria.  Genitourinary:  Negative for dysuria, frequency and urgency.  Musculoskeletal:  Negative for arthralgias, back pain and myalgias.  Skin:  Negative for pallor and rash.  Allergic/Immunologic: Negative for environmental allergies.  Neurological:  Negative for dizziness, syncope and headaches.  Hematological:  Negative for adenopathy. Does not bruise/bleed easily.  Psychiatric/Behavioral:  Negative for decreased concentration and dysphoric mood. The patient is not nervous/anxious.        Objective:   Physical Exam Constitutional:      General: She is not in acute distress.    Appearance: Normal appearance. She is well-developed. She is obese. She is not ill-appearing or diaphoretic.  HENT:     Head: Normocephalic and atraumatic.  Eyes:     Conjunctiva/sclera: Conjunctivae normal.     Pupils: Pupils are equal, round, and reactive to light.  Neck:     Thyroid: No thyromegaly.     Vascular:  No carotid bruit or JVD.  Cardiovascular:     Rate and Rhythm: Normal rate and regular rhythm.     Heart sounds: Normal heart sounds.     No gallop.  Pulmonary:     Effort: Pulmonary effort is normal. No respiratory distress.     Breath sounds: Normal breath sounds. No wheezing or rales.  Abdominal:     General: There is no distension or abdominal bruit.     Palpations: Abdomen is soft.  Musculoskeletal:     Cervical back: Normal range of motion and neck supple.     Right lower leg: No edema.     Left lower leg: No edema.  Lymphadenopathy:     Cervical: No cervical adenopathy.  Skin:    General: Skin is warm and dry.     Coloration: Skin is not pale.     Findings: No rash.  Neurological:     Mental Status: She is alert.     Coordination: Coordination normal.     Deep Tendon Reflexes: Reflexes are normal and symmetric. Reflexes normal.  Psychiatric:        Mood and Affect: Mood normal.           Assessment & Plan:   Problem List Items Addressed This Visit       Cardiovascular and Mediastinum   Essential hypertension    bp in fair control at this time  BP Readings from Last 1 Encounters:  10/21/23 124/78   No changes needed Most recent labs reviewed  Disc lifstyle change with low sodium diet and exercise  Plan to continue Losartan hct 100-25 mg daily  Metoprolol xl 50 mg daily         Endocrine   Diabetes mellitus type 2, insulin dependent (HCC)    See a/p for DM with hyperglycemia Plan to increase trulicity dose to 4.5 mg weekly      Relevant Medications   Dulaglutide (TRULICITY) 4.5 MG/0.5ML SOAJ   Other Relevant Orders   Microalbumin / creatinine urine ratio   Hyperlipidemia associated with type 2 diabetes mellitus (HCC)    Disc goals for lipids and reasons to control them Rev last labs with pt Rev low sat fat diet in detail  Stable LDL of 68, at goal Plan to continue crestor 5 mg daily and good diet         Relevant Medications    Dulaglutide (TRULICITY) 4.5 MG/0.5ML SOAJ   Type 2 diabetes mellitus with hyperglycemia (HCC) - Primary    Lab Results  Component Value Date   HGBA1C 7.9 (A) 10/21/2023   This is much improved from 10.2 Using CGM and watching trends Needs to eat smaller portions more frequently and more veggies/fruit  Goal is A1c under 7  Will increase trulicity to 4.5 mg weekly-if well tolerated can start 3 mo refills after his mo Glipizide xl 10 mg daily (dec dose later if needed if hypoglycemia) Levemir 65 u daily  Due for microalb today  Normal foot exam  Eye exam utd Follow up 3 mo      Relevant Medications   Dulaglutide (TRULICITY) 4.5 MG/0.5ML SOAJ   Other Relevant Orders   POCT HgB A1C (Completed)

## 2023-10-21 NOTE — Assessment & Plan Note (Signed)
See a/p for DM with hyperglycemia Plan to increase trulicity dose to 4.5 mg weekly

## 2023-10-21 NOTE — Assessment & Plan Note (Signed)
Lab Results  Component Value Date   HGBA1C 7.9 (A) 10/21/2023   This is much improved from 10.2 Using CGM and watching trends Needs to eat smaller portions more frequently and more veggies/fruit  Goal is A1c under 7  Will increase trulicity to 4.5 mg weekly-if well tolerated can start 3 mo refills after his mo Glipizide xl 10 mg daily (dec dose later if needed if hypoglycemia) Levemir 65 u daily  Due for microalb today  Normal foot exam  Eye exam utd Follow up 3 mo

## 2023-10-21 NOTE — Assessment & Plan Note (Signed)
bp in fair control at this time  BP Readings from Last 1 Encounters:  10/21/23 124/78   No changes needed Most recent labs reviewed  Disc lifstyle change with low sodium diet and exercise  Plan to continue Losartan hct 100-25 mg daily  Metoprolol xl 50 mg daily

## 2023-10-21 NOTE — Patient Instructions (Addendum)
Go up on the trulicity to 4.5 mg weekly  If any problems/side effects or if you get low glucose readings let us know   Urine protein test today   Take care of yourself   Start thinking about making time for exercise if you can  Both cardio and strength building exercise  Add some strength training to your routine, this is important for bone and brain health and can reduce your risk of falls and help your body use insulin properly and regulate weight  Light weights, exercise bands , and internet videos are a good way to start  Yoga (chair or regular), machines , floor exercises or a gym with machines are also good options

## 2023-10-21 NOTE — Assessment & Plan Note (Signed)
Disc goals for lipids and reasons to control them Rev last labs with pt Rev low sat fat diet in detail  Stable LDL of 68, at goal Plan to continue crestor 5 mg daily and good diet

## 2023-10-22 ENCOUNTER — Encounter: Payer: Self-pay | Admitting: *Deleted

## 2023-11-08 ENCOUNTER — Other Ambulatory Visit: Payer: Self-pay | Admitting: Family Medicine

## 2023-11-23 ENCOUNTER — Other Ambulatory Visit: Payer: Self-pay | Admitting: Family Medicine

## 2023-11-25 ENCOUNTER — Other Ambulatory Visit: Payer: Self-pay | Admitting: Family Medicine

## 2023-11-25 NOTE — Telephone Encounter (Signed)
Last OV was on 10/21/23  Trulilcity last filled on 10/21/23 # 2 mL/ 0 refill  Levemir last filled on 12/24/22 # 15 mL/ 5 refills

## 2023-11-25 NOTE — Telephone Encounter (Signed)
SEE PHARMACY'S COMMENTS REGARDING MED:  Product Backordered/Unavailable:LEVEMIR IS BEING DISCONTINUED BY MANUFACTURER IN 2024. PLEASE CONSIDER THE POTENTIAL ALTERNATIVE(S) LISTED AND EVALUATE IF APPROPRIATE FOR YOUR PATIENT'S TREATMENT GOALS.   All Pharmacy Suggested Alternatives:  Insulin Glargine (BASAGLAR KWIKPEN) 100 UNIT/ML Insulin Glargine-aglr (REZVOGLAR KWIKPEN) 100 UNIT/ML SOPN insulin glargine-yfgn (SEMGLEE, YFGN,) 100 UNIT/ML Pen Insulin Degludec (TRESIBA) 100 UNIT/ML SOLN insulin degludec (TRESIBA FLEXTOUCH) 100 UNIT/ML FlexTouch Pen

## 2024-01-20 DIAGNOSIS — L400 Psoriasis vulgaris: Secondary | ICD-10-CM | POA: Diagnosis not present

## 2024-01-20 DIAGNOSIS — D2262 Melanocytic nevi of left upper limb, including shoulder: Secondary | ICD-10-CM | POA: Diagnosis not present

## 2024-01-20 DIAGNOSIS — D235 Other benign neoplasm of skin of trunk: Secondary | ICD-10-CM | POA: Diagnosis not present

## 2024-01-20 DIAGNOSIS — D485 Neoplasm of uncertain behavior of skin: Secondary | ICD-10-CM | POA: Diagnosis not present

## 2024-01-20 DIAGNOSIS — D225 Melanocytic nevi of trunk: Secondary | ICD-10-CM | POA: Diagnosis not present

## 2024-01-20 DIAGNOSIS — Z86006 Personal history of melanoma in-situ: Secondary | ICD-10-CM | POA: Diagnosis not present

## 2024-01-23 ENCOUNTER — Encounter: Payer: Self-pay | Admitting: Family Medicine

## 2024-01-23 ENCOUNTER — Ambulatory Visit: Payer: BC Managed Care – PPO | Admitting: Family Medicine

## 2024-01-23 VITALS — BP 124/72 | HR 86 | Temp 97.9°F | Ht 63.75 in | Wt 205.4 lb

## 2024-01-23 DIAGNOSIS — Z794 Long term (current) use of insulin: Secondary | ICD-10-CM | POA: Diagnosis not present

## 2024-01-23 DIAGNOSIS — E66812 Obesity, class 2: Secondary | ICD-10-CM | POA: Diagnosis not present

## 2024-01-23 DIAGNOSIS — I10 Essential (primary) hypertension: Secondary | ICD-10-CM

## 2024-01-23 DIAGNOSIS — B009 Herpesviral infection, unspecified: Secondary | ICD-10-CM

## 2024-01-23 DIAGNOSIS — E559 Vitamin D deficiency, unspecified: Secondary | ICD-10-CM

## 2024-01-23 DIAGNOSIS — E039 Hypothyroidism, unspecified: Secondary | ICD-10-CM

## 2024-01-23 DIAGNOSIS — E785 Hyperlipidemia, unspecified: Secondary | ICD-10-CM

## 2024-01-23 DIAGNOSIS — E1165 Type 2 diabetes mellitus with hyperglycemia: Secondary | ICD-10-CM

## 2024-01-23 DIAGNOSIS — E1169 Type 2 diabetes mellitus with other specified complication: Secondary | ICD-10-CM | POA: Diagnosis not present

## 2024-01-23 DIAGNOSIS — N1831 Chronic kidney disease, stage 3a: Secondary | ICD-10-CM

## 2024-01-23 DIAGNOSIS — Z6835 Body mass index (BMI) 35.0-35.9, adult: Secondary | ICD-10-CM

## 2024-01-23 DIAGNOSIS — E119 Type 2 diabetes mellitus without complications: Secondary | ICD-10-CM

## 2024-01-23 LAB — POCT GLYCOSYLATED HEMOGLOBIN (HGB A1C): Hemoglobin A1C: 8.8 % — AB (ref 4.0–5.6)

## 2024-01-23 MED ORDER — VALACYCLOVIR HCL 500 MG PO TABS: ORAL_TABLET | ORAL | 3 refills | Status: AC

## 2024-01-23 MED ORDER — INSULIN GLARGINE 100 UNIT/ML ~~LOC~~ SOLN: 75.0000 [IU] | Freq: Every day | SUBCUTANEOUS | 1 refills | Status: DC

## 2024-01-23 NOTE — Patient Instructions (Addendum)
 I want to refer you to pharmacy team   Go to your eye clinic for diabetic eye exam  Have them send us  a copy     Keep trying new foods  Try to get most of your carbohydrates from produce (with the exception of white potatoes) and whole grains Eat less bread/pasta/rice/snack foods/cereals/sweets and other items from the middle of the grocery store (processed carbs) When you crave sugar- eat fruit instead of sweets    Go up to 70 on the semglee  then after a week if no low glucose levels, try going up to 75 units   I put the referral in for pharmacy team  Please let us  know if you don't hear in 1-2 weeks   Follow up in 3 months   Investigate swimming

## 2024-01-23 NOTE — Assessment & Plan Note (Signed)
 Last GFR 58.3 in setting of dm and HTN  Neg microalb  Encouraged fluid intake and avoidance of nephrotoxins  Will continue to monitor

## 2024-01-23 NOTE — Assessment & Plan Note (Signed)
 Lab Results  Component Value Date   HGBA1C 8.8 (A) 01/23/2024   HGBA1C 7.9 (A) 10/21/2023   HGBA1C 10.2 (H) 07/19/2023   Unfortunately up despite increaed trulicity  to 4.5  Continues Glipizide  xl 10 mg daily  Semglee  65 u daily   Very picky eater (meat and potato and some fruit but nothing else)  Has done dm teaching  Will increase semglee  to 70 then 75 monitoring for low glucose  Discussed low glycemic diet   Microalb utd On statin   Referred to pharmacy team for help with management

## 2024-01-23 NOTE — Assessment & Plan Note (Signed)
 bp in fair control at this time  BP Readings from Last 1 Encounters:  01/23/24 124/72   No changes needed Most recent labs reviewed  Disc lifstyle change with low sodium diet and exercise  Plan to continue Losartan  hct 100-25 mg daily  Metoprolol  xl 50 mg daily

## 2024-01-23 NOTE — Assessment & Plan Note (Signed)
 Discussed how this problem influences overall health and the risks it imposes  Reviewed plan for weight loss with lower calorie diet (via better food choices (lower glycemic and portion control) along with exercise building up to or more than 30 minutes 5 days per week including some aerobic activity and strength training    Difficult situation in light of extremely picky eating  Has seen dietician in past Encouraged to add strength training

## 2024-01-23 NOTE — Assessment & Plan Note (Signed)
Disc goals for lipids and reasons to control them Rev last labs with pt Rev low sat fat diet in detail  Stable LDL of 68, at goal Plan to continue crestor 5 mg daily and good diet

## 2024-01-23 NOTE — Assessment & Plan Note (Signed)
 Occational outbreaks Sent valtrex  to pharmacy to use 500 mg bid for 5 d prn

## 2024-01-23 NOTE — Progress Notes (Signed)
 Subjective:    Patient ID: Diana Deleon, female    DOB: 09-Dec-1961, 63 y.o.   MRN: [id number]  HPI  Wt Readings from Last 3 Encounters:  01/23/24 205 lb 6 oz (93.2 kg)  10/21/23 212 lb 2 oz (96.2 kg)  07/19/23 209 lb 2 oz (94.9 kg)   35.53 kg/m  Vitals:   01/23/24 0757  BP: 124/72  Pulse: 86  Temp: 97.9 F (36.6 C)  SpO2: 96%    Pt presents for follow up of DM2 and other chronic health problems including HTN and hyperlipidemia   HTN bp is stable today  No cp or palpitations or headaches or edema  No side effects to medicines  BP Readings from Last 3 Encounters:  01/23/24 124/72  10/21/23 124/78  07/19/23 96/66    Losartan  hct 100-25 mg daily  Metoprolol  xl 50 mg daily  Lab Results  Component Value Date   NA 138 08/08/2023   K 3.7 08/08/2023   CO2 33 (H) 08/08/2023   GLUCOSE 252 (H) 08/08/2023   BUN 13 08/08/2023   CREATININE 1.04 08/08/2023   CALCIUM  9.4 08/08/2023   GFR 58.34 (L) 08/08/2023   EGFR 75 (L) 06/27/2015   GFRNONAA >60 02/14/2022      Dm2 Lab Results  Component Value Date   HGBA1C 8.8 (A) 01/23/2024   HGBA1C 7.9 (A) 10/21/2023   HGBA1C 10.2 (H) 07/19/2023    Today  8.8  Had GCM Glipizide  xl 10 mg daily  Semglee  65 u daily (levemir  was discontinued)  Last visit increase trulicity  to 4.5 mg weekly  Lab Results  Component Value Date   MICROALBUR <0.7 10/21/2023   MICROALBUR <0.7 10/04/2022   Eye exam- did not get one yet this year    Feels good overall   Blood sugars have been stable  No lows  Avg is around 170s  Hit 300 occasionally    Thinks it has to do with what she is eating  Occational eats bread  Occational sweets  Occational sprite (small can)   Not eating too much  Choosing the wrong things   Very picky eater  Does not like to cook   Only likes meat and potato and rice      Exercise  Some chair yoga  3-4 times per week  [facility]     Hyperlipidemia Lab Results  Component  Value Date   CHOL 139 04/11/2023   HDL 51.90 04/11/2023   LDLCALC 68 04/11/2023   TRIG 95.0 04/11/2023   CHOLHDL 3 04/11/2023   Crestor  5 mg daily     Patient Active Problem List   Diagnosis Date Noted   Diabetes mellitus type 2, insulin  dependent (HCC) 08/12/2023   Chronic kidney disease (CKD) stage G3a/A1, moderately decreased glomerular filtration rate (GFR) between 45-59 mL/min/1.73 square meter and albuminuria creatinine ratio less than 30 mg/g (HCC) 04/18/2023   History of melanoma in situ 04/18/2023   Urinary frequency 04/18/2023   Vitamin D  deficiency 04/08/2023   Restless legs 04/04/2022   S/P TKR (total knee replacement) using cement, right 02/13/2022   Current use of proton pump inhibitor 05/09/2021   S/P TKR (total knee replacement) using cement, left 06/21/2020   Osteoarthritis 04/28/2020   Hyperlipidemia associated with type 2 diabetes mellitus (HCC) 11/02/2018   Low back pain 03/21/2018   Osteopenia 01/05/2018   Arthralgia 07/03/2017   Chronic pain of left knee 07/03/2017   Psoriasis 11/02/2016   Colon cancer  screening 11/02/2016   Class 2 severe obesity due to excess calories with serious comorbidity and body mass index (BMI) of 35.0 to 35.9 in adult [facility]) 10/31/2015   History of breast cancer 10/25/2014   Type 2 diabetes mellitus with hyperglycemia (HCC) 07/14/2013   Vaginal intraepithelial neoplasia grade 1 07/09/2013   Routine general medical examination at a health care facility 10/12/2012   Hypothyroidism 06/03/2012   Overactive bladder 05/27/2012   HSV infection 07/07/2010   LOW BACK PAIN, CHRONIC 05/17/2008   Headache 05/17/2008   Essential hypertension 02/12/2008   Past Medical History:  Diagnosis Date   Anemia    off and on   Arthritis    right toes (03/24/2015)   Cancer of right breast (HCC) 2015   Cyst of cystic duct    chest- central location, taking Cephalexin  currently   Diabetes mellitus without complication (HCC)    Fibroids     hyst. 2006   GERD (gastroesophageal reflux disease)    Headache    History of abnormal Pap smear    has had leep in past   History of cold sores    has used valtrex  in past   History of kidney stones    History of recurrent UTIs    Hypertension    Hypothyroidism    Intermittent vertigo    Seborrheic dermatitis    local on R scalp and in ear (failed mult meds- uses steroid spray)   Thyroid  disease    Past Surgical History:  Procedure Laterality Date   BREAST RECONSTRUCTION WITH PLACEMENT OF TISSUE EXPANDER AND FLEX HD (ACELLULAR HYDRATED DERMIS) Right 11/25/2014   Procedure: RIGHT BREAST RECONSTRUCTION WITH PLACEMENT OF TISSUE EXPANDER AND USE OF ACELLULAR DERMRAL MATRIX ;  Surgeon: Alm Sick, MD;  Location: [facility] OR;  Service: Plastics;  Laterality: Right;   BREAST REDUCTION SURGERY Left 03/24/2015   Procedure: LEFT BREAST REDUCTION  (BREAST);  Surgeon: Alm Sick, MD;  Location: [facility] OR;  Service: Plastics;  Laterality: Left;   BREAST SURGERY Right    mastectomy  lymph node removal   COLONOSCOPY     COLONOSCOPY WITH PROPOFOL  N/A 12/28/2016   Procedure: COLONOSCOPY WITH PROPOFOL ;  Surgeon: Ruel Kung, MD;  Location: [facility] ENDOSCOPY;  Service: Endoscopy;  Laterality: N/A;   LAPAROSCOPIC CHOLECYSTECTOMY  2003   LITHOTRIPSY  ~ 2007   REMOVAL OF TISSUE EXPANDER Right 03/24/2015   REMOVAL OF TISSUE EXPANDER AND PLACEMENT OF IMPLANT Right 03/24/2015   w/delayed construction   REMOVAL OF TISSUE EXPANDER AND PLACEMENT OF IMPLANT Right 03/24/2015   Procedure: REMOVAL OF RIGHT BREAST TISSUE EXPANDER AND DELAYED BREAST RECONSTRUCTION WITH PLACEMENT OF IMPLANT;  Surgeon: Alm Sick, MD;  Location: [facility] OR;  Service: Plastics;  Laterality: Right;   SIMPLE MASTECTOMY WITH AXILLARY SENTINEL NODE BIOPSY Right 11/25/2014   Procedure: RIGHT TOTAL  MASTECTOMY WITH AXILLARY SENTINEL NODE BIOPSY ;  Surgeon: Morene Olives, MD;  Location: [facility] OR;  Service: General;  Laterality: Right;   TOTAL KNEE ARTHROPLASTY  Left 06/21/2020   Procedure: LEFT TOTAL KNEE ARTHROPLASTY;  Surgeon: Kathlynn Sharper, MD;  Location: [facility] ORS;  Service: Orthopedics;  Laterality: Left;   TOTAL KNEE ARTHROPLASTY Right 02/13/2022   Procedure: TOTAL KNEE ARTHROPLASTY;  Surgeon: Kathlynn Sharper, MD;  Location: [facility] ORS;  Service: Orthopedics;  Laterality: Right;   TUBAL LIGATION     VAGINAL DELIVERY  x3   VAGINAL HYSTERECTOMY  2006   fibroids   Social History   Tobacco Use   Smoking status: Never  Smokeless tobacco: Never  Vaping Use   Vaping status: Never Used  Substance Use Topics   Alcohol use: No    Alcohol/week: 0.0 standard drinks of alcohol   Drug use: No   Family History  Problem Relation Age of Onset   Alcohol abuse Father    COPD Father    Hypertension Father    Hypertension Mother    Parkinson's disease Mother    Allergies  Allergen Reactions   Metformin  And Related Diarrhea   Norvasc [Amlodipine Besylate] Swelling   Current Outpatient Medications on File Prior to Visit  Medication Sig Dispense Refill   Calcium  Carb-Cholecalciferol  600-800 MG-UNIT TABS Take 2 tablets by mouth every evening.     Continuous Glucose Receiver (DEXCOM G6 RECEIVER) DEVI Use as directed for diabetes 1 each 0   Continuous Glucose Sensor (DEXCOM G6 SENSOR) MISC 1 each by Does not apply route as directed. 3 each 11   Continuous Glucose Transmitter (DEXCOM G6 TRANSMITTER) MISC Use as directed for diabetes 1 each 3   Dulaglutide  (TRULICITY ) 4.5 MG/0.5ML SOAJ INJECT 4.5 MG AS DIRECTED ONCE A WEEK. 6 mL 1   gabapentin  (NEURONTIN ) 300 MG capsule TAKE 1 CAPSULE BY MOUTH EVERYDAY AT BEDTIME 90 capsule 0   glipiZIDE  (GLUCOTROL  XL) 10 MG 24 hr tablet TAKE 1 TABLET (10 MG TOTAL) BY MOUTH DAILY WITH BREAKFAST. 90 tablet 2   glucose blood (ONETOUCH VERIO) test strip USE TO CHECK BLOOD SUGAR ONCE DAILY AND AS NEEDED 100 each 2   levothyroxine  (SYNTHROID ) 50 MCG tablet TAKE 1 TABLET BY MOUTH EVERY DAY BEFORE BREAKFAST 90 tablet 2    losartan -hydrochlorothiazide  (HYZAAR) 100-25 MG tablet TAKE 1 TABLET BY MOUTH EVERY DAY 90 tablet 3   metoprolol  succinate (TOPROL -XL) 50 MG 24 hr tablet TAKE 1 TABLET BY MOUTH EVERY DAY WITH OR IMMEDIATELY FOLLOWING A MEAL 90 tablet 3   Multiple Vitamin (MULTIVITAMIN WITH MINERALS) TABS tablet Take 1 tablet by mouth every evening.     omeprazole  (PRILOSEC ) 20 MG capsule TAKE 1 CAPSULE BY MOUTH DAILY BEFORE BREAKFAST 90 capsule 3   ONETOUCH DELICA LANCETS FINE MISC Use to check blood sugar once daily and as needed (dx. E11.9) 100 each 1   potassium chloride  (KLOR-CON ) 10 MEQ tablet TAKE 1 TABLET BY MOUTH EVERY DAY 90 tablet 3   Risankizumab-rzaa (SKYRIZI, 150 MG DOSE, ) Inject 150 mcg into the skin every 3 (three) months. Take as directed (every 3 month)     rosuvastatin  (CRESTOR ) 5 MG tablet TAKE 1 TABLET (5 MG TOTAL) BY MOUTH DAILY. 90 tablet 3   venlafaxine  XR (EFFEXOR -XR) 75 MG 24 hr capsule TAKE 1 CAPSULE(75 MG) BY MOUTH DAILY WITH BREAKFAST 90 capsule 3   No current facility-administered medications on file prior to visit.    Review of Systems  Constitutional:  Negative for activity change, appetite change, fatigue, fever and unexpected weight change.  HENT:  Negative for congestion, ear pain, rhinorrhea, sinus pressure and sore throat.   Eyes:  Negative for pain, redness and visual disturbance.  Respiratory:  Negative for cough, shortness of breath and wheezing.   Cardiovascular:  Negative for chest pain and palpitations.  Gastrointestinal:  Negative for abdominal pain, blood in stool, constipation and diarrhea.  Endocrine: Negative for polydipsia and polyuria.  Genitourinary:  Negative for dysuria, frequency and urgency.  Musculoskeletal:  Negative for arthralgias, back pain and myalgias.  Skin:  Negative for pallor and rash.  Allergic/Immunologic: Negative for environmental allergies.  Neurological:  Negative for dizziness,  syncope and headaches.  Hematological:  Negative for  adenopathy. Does not bruise/bleed easily.  Psychiatric/Behavioral:  Negative for decreased concentration and dysphoric mood. The patient is not nervous/anxious.        Objective:   Physical Exam Constitutional:      General: She is not in acute distress.    Appearance: Normal appearance. She is well-developed. She is obese. She is not ill-appearing or diaphoretic.  HENT:     Head: Normocephalic and atraumatic.  Eyes:     Conjunctiva/sclera: Conjunctivae normal.     Pupils: Pupils are equal, round, and reactive to light.  Neck:     Thyroid : No thyromegaly.     Vascular: No carotid bruit or JVD.  Cardiovascular:     Rate and Rhythm: Normal rate and regular rhythm.     Heart sounds: Normal heart sounds.     No gallop.  Pulmonary:     Effort: Pulmonary effort is normal. No respiratory distress.     Breath sounds: Normal breath sounds. No wheezing or rales.  Abdominal:     General: There is no distension or abdominal bruit.     Palpations: Abdomen is soft.  Musculoskeletal:     Cervical back: Normal range of motion and neck supple.     Right lower leg: No edema.     Left lower leg: No edema.  Lymphadenopathy:     Cervical: No cervical adenopathy.  Skin:    General: Skin is warm and dry.     Coloration: Skin is not pale.     Findings: No rash.  Neurological:     Mental Status: She is alert.     Coordination: Coordination normal.     Deep Tendon Reflexes: Reflexes are normal and symmetric. Reflexes normal.  Psychiatric:        Mood and Affect: Mood normal.           Assessment & Plan:   Problem List Items Addressed This Visit       Cardiovascular and Mediastinum   Essential hypertension   bp in fair control at this time  BP Readings from Last 1 Encounters:  01/23/24 124/72   No changes needed Most recent labs reviewed  Disc lifstyle change with low sodium diet and exercise  Plan to continue Losartan  hct 100-25 mg daily  Metoprolol  xl 50 mg daily        Relevant Orders   AMB Referral [facility] Care Management     Endocrine   Type 2 diabetes mellitus with hyperglycemia (HCC) - Primary   Relevant Medications   insulin  glargine (SEMGLEE ) 100 UNIT/ML injection   Other Relevant Orders   POCT HgB A1C (Completed)   AMB Referral [facility] Care Management   Hyperlipidemia associated with type 2 diabetes mellitus (HCC)   Disc goals for lipids and reasons to control them Rev last labs with pt Rev low sat fat diet in detail  Stable LDL of 68, at goal Plan to continue crestor  5 mg daily and good diet         Relevant Medications   insulin  glargine (SEMGLEE ) 100 UNIT/ML injection   Other Relevant Orders   AMB Referral [facility]I Care Management   Diabetes mellitus type 2, insulin  dependent (HCC)   Lab Results  Component Value Date   HGBA1C 8.8 (A) 01/23/2024   HGBA1C 7.9 (A) 10/21/2023   HGBA1C 10.2 (H) 07/19/2023   Unfortunately up despite increaed trulicity  to 4.5  Continues Glipizide  xl 10 mg daily  Semglee  65  u daily   Very picky eater (meat and potato and some fruit but nothing else)  Has done dm teaching  Will increase semglee  to 70 then 75 monitoring for low glucose  Discussed low glycemic diet   Microalb utd On statin   Referred to pharmacy team for help with management        Relevant Medications   insulin  glargine (SEMGLEE ) 100 UNIT/ML injection     Genitourinary   Chronic kidney disease (CKD) stage G3a/A1, moderately decreased glomerular filtration rate (GFR) between 45-59 mL/min/1.73 square meter and albuminuria creatinine ratio less than 30 mg/g (HCC)   Last GFR 58.3 in setting of dm and HTN  Neg microalb  Encouraged fluid intake and avoidance of nephrotoxins  Will continue to monitor         Other   HSV infection   Occational outbreaks Sent valtrex  to pharmacy to use 500 mg bid for 5 d prn         Relevant Medications   valACYclovir  (VALTREX ) 500 MG tablet   Class 2 severe obesity due to excess calories with  serious comorbidity and body mass index (BMI) of 35.0 to 35.9 in adult [facility])   Discussed how this problem influences overall health and the risks it imposes  Reviewed plan for weight loss with lower calorie diet (via better food choices (lower glycemic and portion control) along with exercise building up to or more than 30 minutes 5 days per week including some aerobic activity and strength training    Difficult situation in light of extremely picky eating  Has seen dietician in past Encouraged to add strength training       Relevant Medications   insulin  glargine (SEMGLEE ) 100 UNIT/ML injection

## 2024-01-24 ENCOUNTER — Telehealth: Payer: Self-pay

## 2024-01-24 NOTE — Progress Notes (Signed)
 Care Guide Pharmacy Note  01/24/2024 Name: Diana Deleon MRN: [id number] DOB: 09-03-1961  Referred By: Randeen Laine LABOR, MD Reason for referral: Care Coordination (Outreach to schedule with Pharm d )   Diana Deleon is a 63 y.o. year old female who is a primary care patient of Tower, Laine LABOR, MD.  Diana Deleon was referred to the pharmacist for assistance related to: DMII  Successful contact was made with the patient to discuss pharmacy services including being ready for the pharmacist to call at least 5 minutes before the scheduled appointment time and to have medication bottles and any blood pressure readings ready for review. The patient agreed to meet with the pharmacist via telephone visit on (date/time).01/30/2024  Jeoffrey Buffalo , RMA     [place]  [facility], [facility] Guide  Direct Dial: [phone]  Website: [email or web address]

## 2024-01-29 DIAGNOSIS — Z79899 Other long term (current) drug therapy: Secondary | ICD-10-CM | POA: Diagnosis not present

## 2024-01-30 ENCOUNTER — Other Ambulatory Visit: Payer: BC Managed Care – PPO

## 2024-01-30 NOTE — Progress Notes (Deleted)
 01/30/2024 Name: Diana Deleon MRN: [id number] DOB: 13-Feb-1961  Subjective  No chief complaint on file.   Reason for visit: ?  Diana Deleon is a 63 y.o. female with a history of diabetes (type 2), who presents today for an initial diabetes pharmacotherapy visit.? Pertinent PMH also includes HLD, hypothyroidism, CKD, BMI>30.  Known DM Complications: {Blank multiple:19197::"retinopathy","peripheral neuropathy","nephropathy","hypoglycemia unawareness","severe hypoglycemia","DKA","autonomic neuropathy","gastroparesis","amputation","significant fear of hypoglycemia","no known complications"}   Care Team: Primary Care Provider: Tower, Diana Gallus, MD   Date of Last Diabetes Related Visit: {LZ with PCP with pharmacist:31532}  Recent Summary of Change: ***  Medication Access/Adherence: Prescription drug coverage: Payor: BLUE CROSS BLUE SHIELD / Plan: BCBS COMM PPO / Product Type: *No Product type* / .  - Reports that all medications are *** affordable.  - Current Patient Assistance: None*** - Medication Adherence: Patient denies*** missing doses of their medication.    Since Last visit / History of Present Illness: ?  Patient reports implementing*** plan from last visit. Denies adverse effects with titration of ***.   Reported DM Regimen: ?  dulaglutide (Trulicity) 4.5 mg  weekly glipizide XL 10 mg daily Insulin glargine (Semglee)  75*** units daily    DM medications tried in the past:?  Metformin (diarrhea) *** (***) *** (***)  SMBG {Blank single:19197::"Per BG meter","Per patient provided BG log","Per patient memory","***"}: ?  ***   Hypo/Hyperglycemia: ?  Symptoms of hypoglycemia since last visit:? {Blank multiple:19197::"yes","no","n/a","***"}  If yes, it was treated by: {Blank multiple:19197::"***","n/a"}  Symptoms of hyperglycemia since last visit:? {Blank multiple:19197::"yes","no","n/a","***"} - {symptoms; hyperglycemia:17903}  Reported Diet: Patient typically eats *** meals  per day.  Breakfast: *** Lunch: *** Dinner: *** Snacks: *** Beverages: ***  Exercise: ***  DM Prevention:  Statin: Taking; moderate intensity.?  History of chronic kidney disease? yes History of albuminuria? no, last UACR on 10/21/2023 = 0.4 mg/g ACE/ARB - Taking losartan; Urine MA/CR Ratio - normal.  Last eye exam: 01/11/2023; No retinopathy present - DUE Last foot exam: 10/21/2023 Tobacco Use: Never smoker  Immunizations:? Flu: Up to date (Last: 10/10/2023); Pneumococcal: PPSV23 (01/2019); Shingrix: No Record - DUE  Cardiovascular Risk Reduction History of clinical ASCVD? no The 10-year ASCVD risk score (Arnett DK, et al., 2019) is: 7% History of heart failure? no History of hyperlipidemia? yes Current BMI: 35.5 kg/m2 (Ht 5'3.75", Wt 93.2 kg) Taking statin? yes; moderate intensity (rosuvastatin 5 mg) Taking aspirin? not indicated; Not taking   Taking SGLT-2i? no Taking GLP- 1 RA? yes   Reported HTN Regimen: ?  Losartan/hydrochlorothiazide 100-25 mg daily Metoprolol succinate 50 mg daily   HTN medications tried in the past:?  *** (***)  Patient takes their blood pressure medications {Time; morning/afternoon/evening:19180} Patient {Is/is not:9024} checking their blood pressure at home regularly.  Current blood pressure readings readings: {lzhomemonitoring:31119} Usually range ***/*** mmHg (morning)  Patient {Actions; denies-reports:120008} hypotensive s/sx. No dizziness, lightheadedness.  Patient {Actions; denies-reports:120008} hypertensive symptoms. No headache, chest pain, shortness of breath, visual changes.      _______________________________________________  Objective    Review of Systems:? Limited in the setting of virtual visit *** Constitutional:? No fever, chills or unintentional weight loss  Cardiovascular:? No chest pain or pressure, shortness of breath, dyspnea on exertion, orthopnea or LE edema  Pulmonary:? No cough or shortness of breath  GI:? No  nausea, vomiting, constipation, diarrhea, abdominal pain, dyspepsia, change in bowel habits  Endocrine:? No polyuria, polyphagia or blurred vision  Psych:? No depression, anxiety, insomnia    Physical Examination:  Vitals:  Wt Readings from  Last 3 Encounters:  01/23/24 205 lb 6 oz (93.2 kg)  10/21/23 212 lb 2 oz (96.2 kg)  07/19/23 209 lb 2 oz (94.9 kg)   BP Readings from Last 3 Encounters:  01/23/24 124/72  10/21/23 124/78  07/19/23 96/66   Pulse Readings from Last 3 Encounters:  01/23/24 86  10/21/23 (!) 106  07/19/23 82     Labs:?  Lab Results  Component Value Date   HGBA1C 8.8 (A) 01/23/2024   HGBA1C 7.9 (A) 10/21/2023   HGBA1C 10.2 (H) 07/19/2023   GLUCOSE 252 (H) 08/08/2023   MICRALBCREAT 0.4 10/21/2023   MICRALBCREAT 0.3 10/04/2022   CREATININE 1.04 08/08/2023   CREATININE 1.08 07/19/2023   CREATININE 1.21 (H) 04/11/2023   GFR 58.34 (L) 08/08/2023   GFR 55.78 (L) 07/19/2023   GFR 48.76 (L) 04/11/2023    Lab Results  Component Value Date   CHOL 139 04/11/2023   LDLCALC 68 04/11/2023   LDLCALC 64 10/04/2022   LDLCALC 60 03/28/2022   HDL 51.90 04/11/2023   TRIG 95.0 04/11/2023   TRIG 131.0 10/04/2022   TRIG 196.0 (H) 03/28/2022   ALT 24 04/11/2023   ALT 22 10/04/2022   AST 24 04/11/2023   AST 21 10/04/2022      Chemistry      Component Value Date/Time   NA 138 08/08/2023 0817   NA 142 06/27/2015 0949   K 3.7 08/08/2023 0817   K 3.5 06/27/2015 0949   CL 97 08/08/2023 0817   CO2 33 (H) 08/08/2023 0817   CO2 30 (H) 06/27/2015 0949   BUN 13 08/08/2023 0817   BUN 15.1 06/27/2015 0949   CREATININE 1.04 08/08/2023 0817   CREATININE 0.9 06/27/2015 0949      Component Value Date/Time   CALCIUM 9.4 08/08/2023 0817   CALCIUM 9.3 06/27/2015 0949   ALKPHOS 83 04/11/2023 0730   ALKPHOS 88 06/27/2015 0949   AST 24 04/11/2023 0730   AST 19 06/27/2015 0949   ALT 24 04/11/2023 0730   ALT 22 06/27/2015 0949   BILITOT 1.0 04/11/2023 0730   BILITOT 0.45  06/27/2015 0949       The 10-year ASCVD risk score (Arnett DK, et al., 2019) is: 7%  Assessment and Plan:   1. Diabetes, type 2: uncontrolled per last A1c of 8.6% (01/24/24), increased from previous 7.9% with goal <7% without hypoglycemia. Diet has been main barrier to glycemic control in the setting of dislike of most foods.   Current Regimen: Trulicity 4.5 mg, glipizide XL 10 mg qd, Semglee 75*** units qd  {lzcgmsmbg:31042} data shows ***?. Current Regimen: *** Diet: *** Exercise: ***  HCM: {LZ HCM DM2:31604} Continue medications today without changes.  ***  Reviewed s/sx/tx hypoglycemia Next A1c due *** Future Consideration: GLP1-RA: Would benefit from GLP1 with greater weight and A1c-lowering propensity (Ozempic or Mounjaro) if covered SGLT2i: Reasonable if cost permits Metformin: Reports previous intolerance, though insure if IR/ER. Would trial ER metformin if patient amenable given stable renal function.  TZD: Avoiding due to possible weight gain/increase in fracture risk.  Insulin: Large basal dose >>0.5 u/kg. No access to to CGM report today though if lows, overbasalization. Prandial insulin id reasonable, though defer as last resort or as needed for isolated PP hyperglycemia not improved w dietary modification   2. HTN: controlled based on last clinic BP of 124/72 mmHg (01/23/24), goal <130/80 mmHg. Does not *** monitor BP at home. Denies lightheadedness, dizziness, SOB, CP, vision changes.  Current Regimen: Losartan/hydrochlorothiazide 100-25 mg  daily, Metoprolol succinate 50 mg daily Continue medications without changes.    3. ASCVD (primary prevention): LDL at goal on last lipid panel with LDL 68 mg/dL, TG 95 mg/dL (1/61/09). LDL goal <70 mg/dL (primary prevention, diabetes).  Key risk factors include: diabetes, hypertension (well-controlled), hyperlipidemia (well-controlled), BMI >30 kg/m2, and sedentary lifestyle The 10-year ASCVD risk score (Arnett DK, et al., 2019) is:  7% indicates patient is at moderate risk.  Current Regimen: rosuvastatin 5 mg daily Continue medications today without changes.    4. Healthcare Maintenance:  Pneumococcal - Current status: Up to date. Next dose due at age 70  Shingles - Current status: No Record - DUE Influenza - Current status: Up to date  Due to receive the following vaccines: Shingrix   Follow Up Follow up with clinical pharmacist via *** in *** weeks Patient given direct line for questions regarding medication therapy and MAP applications***  Future Appointments  Date Time Provider Department Center  01/30/2024  3:00 PM [facility] [place] CCM PHARMACIST [facility]  04/14/2024  7:30 AM [facility] LAB [facility]  04/21/2024  8:30 AM Deleon, Diana Gallus, MD [facility]    Loree Fee, PharmD Clinical Pharmacist [facility] [phone]

## 2024-02-10 DIAGNOSIS — D235 Other benign neoplasm of skin of trunk: Secondary | ICD-10-CM | POA: Diagnosis not present

## 2024-02-10 DIAGNOSIS — D225 Melanocytic nevi of trunk: Secondary | ICD-10-CM | POA: Diagnosis not present

## 2024-02-14 ENCOUNTER — Telehealth: Payer: Self-pay

## 2024-02-14 NOTE — Progress Notes (Signed)
 Complex Care Management Care Guide Note  02/14/2024 Name: Diana Deleon MRN: [id number] DOB: 11/27/1961  Diana Deleon is a 63 y.o. year old female who is a primary care patient of Tower, Audrie Gallus, MD and is actively engaged with the care management team. I reached out to Sharmaine Base by phone today to assist with re-scheduling  with the Pharmacist.  Follow up plan: Unsuccessful telephone outreach attempt made. A HIPAA compliant phone message was left for the patient providing contact information and requesting a return call.  Penne Lash , RMA     [facility]  [facility], [facility] Guide  Direct Dial: [phone]  Website: [email or web address]

## 2024-02-27 NOTE — Progress Notes (Signed)
 Complex Care Management Care Guide Note  02/27/2024 Name: Diana Deleon MRN: [id number] DOB: [birth year]  Diana Deleon is a 63 y.o. year old female who is a primary care patient of Tower, Audrie Gallus, MD and is actively engaged with the care management team. I reached out to Sharmaine Base by phone today to assist with re-scheduling  with the Pharmacist.  Follow up plan: Unsuccessful telephone outreach attempt made. A HIPAA compliant phone message was left for the patient providing contact information and requesting a return call.  Penne Lash , RMA     [facility]  [facility], [facility] Guide  Direct Dial: [phone]  Website: [email or web address]

## 2024-03-06 ENCOUNTER — Other Ambulatory Visit: Payer: Self-pay | Admitting: Family Medicine

## 2024-03-09 NOTE — Telephone Encounter (Signed)
 Last filled was 300 mg on 06/24/23 #90 caps/ 0 refills, CPE scheduled on 04/21/24

## 2024-04-13 ENCOUNTER — Telehealth: Payer: Self-pay | Admitting: Family Medicine

## 2024-04-13 DIAGNOSIS — I10 Essential (primary) hypertension: Secondary | ICD-10-CM

## 2024-04-13 DIAGNOSIS — Z79899 Other long term (current) drug therapy: Secondary | ICD-10-CM

## 2024-04-13 DIAGNOSIS — E1169 Type 2 diabetes mellitus with other specified complication: Secondary | ICD-10-CM

## 2024-04-13 DIAGNOSIS — E119 Type 2 diabetes mellitus without complications: Secondary | ICD-10-CM

## 2024-04-13 DIAGNOSIS — E559 Vitamin D deficiency, unspecified: Secondary | ICD-10-CM

## 2024-04-13 DIAGNOSIS — E039 Hypothyroidism, unspecified: Secondary | ICD-10-CM

## 2024-04-13 NOTE — Telephone Encounter (Signed)
-----   Message from Alvina Chou sent at 03/23/2024  3:40 PM EDT ----- Regarding: Lab orders for Tue, 4.29.25 Patient is scheduled for CPX labs, please order future labs, Thanks , Camelia Eng

## 2024-04-14 ENCOUNTER — Other Ambulatory Visit: Payer: BC Managed Care – PPO

## 2024-04-17 ENCOUNTER — Other Ambulatory Visit (INDEPENDENT_AMBULATORY_CARE_PROVIDER_SITE_OTHER)

## 2024-04-17 DIAGNOSIS — Z794 Long term (current) use of insulin: Secondary | ICD-10-CM | POA: Diagnosis not present

## 2024-04-17 DIAGNOSIS — E039 Hypothyroidism, unspecified: Secondary | ICD-10-CM

## 2024-04-17 DIAGNOSIS — Z79899 Other long term (current) drug therapy: Secondary | ICD-10-CM | POA: Diagnosis not present

## 2024-04-17 DIAGNOSIS — E119 Type 2 diabetes mellitus without complications: Secondary | ICD-10-CM

## 2024-04-17 DIAGNOSIS — I10 Essential (primary) hypertension: Secondary | ICD-10-CM | POA: Diagnosis not present

## 2024-04-17 DIAGNOSIS — E785 Hyperlipidemia, unspecified: Secondary | ICD-10-CM

## 2024-04-17 DIAGNOSIS — E559 Vitamin D deficiency, unspecified: Secondary | ICD-10-CM | POA: Diagnosis not present

## 2024-04-17 DIAGNOSIS — E1169 Type 2 diabetes mellitus with other specified complication: Secondary | ICD-10-CM

## 2024-04-17 LAB — COMPREHENSIVE METABOLIC PANEL WITH GFR
ALT: 17 U/L (ref 0–35)
AST: 18 U/L (ref 0–37)
Albumin: 4 g/dL (ref 3.5–5.2)
Alkaline Phosphatase: 94 U/L (ref 39–117)
BUN: 13 mg/dL (ref 6–23)
CO2: 32 meq/L (ref 19–32)
Calcium: 9.4 mg/dL (ref 8.4–10.5)
Chloride: 98 meq/L (ref 96–112)
Creatinine, Ser: 1.13 mg/dL (ref 0.40–1.20)
GFR: 52.55 mL/min — ABNORMAL LOW (ref >60.00)
Glucose, Bld: 168 mg/dL — ABNORMAL HIGH (ref 70–99)
Potassium: 3.8 meq/L (ref 3.5–5.1)
Sodium: 140 meq/L (ref 135–145)
Total Bilirubin: 0.6 mg/dL (ref 0.2–1.2)
Total Protein: 7 g/dL (ref 6.0–8.3)

## 2024-04-17 LAB — CBC WITH DIFFERENTIAL/PLATELET
Basophils Absolute: 0 10*3/uL (ref 0.0–0.1)
Basophils Relative: 0.8 % (ref 0.0–3.0)
Eosinophils Absolute: 0.2 10*3/uL (ref 0.0–0.7)
Eosinophils Relative: 3 % (ref 0.0–5.0)
HCT: 46.1 % — ABNORMAL HIGH (ref 36.0–46.0)
Hemoglobin: 15.3 g/dL — ABNORMAL HIGH (ref 12.0–15.0)
Lymphocytes Relative: 26 % (ref 12.0–46.0)
Lymphs Abs: 1.5 10*3/uL (ref 0.7–4.0)
MCHC: 33.3 g/dL (ref 30.0–36.0)
MCV: 93.9 fl (ref 78.0–100.0)
Monocytes Absolute: 0.5 10*3/uL (ref 0.1–1.0)
Monocytes Relative: 8.5 % (ref 3.0–12.0)
Neutro Abs: 3.6 10*3/uL (ref 1.4–7.7)
Neutrophils Relative %: 61.7 % (ref 43.0–77.0)
Platelets: 237 10*3/uL (ref 150.0–400.0)
RBC: 4.91 Mil/uL (ref 3.87–5.11)
RDW: 14.2 % (ref 11.5–15.5)
WBC: 5.8 10*3/uL (ref 4.0–10.5)

## 2024-04-17 LAB — LIPID PANEL
Cholesterol: 147 mg/dL (ref 0–200)
HDL: 49.3 mg/dL (ref >39.00)
LDL Cholesterol: 79 mg/dL (ref 0–99)
NonHDL: 97.2
Total CHOL/HDL Ratio: 3
Triglycerides: 93 mg/dL (ref 0.0–149.0)
VLDL: 18.6 mg/dL (ref 0.0–40.0)

## 2024-04-17 LAB — HEMOGLOBIN A1C: Hgb A1c MFr Bld: 7.8 % — ABNORMAL HIGH (ref 4.6–6.5)

## 2024-04-17 LAB — VITAMIN D 25 HYDROXY (VIT D DEFICIENCY, FRACTURES): VITD: <7 ng/mL — ABNORMAL LOW (ref 30.00–100.00)

## 2024-04-17 LAB — TSH: TSH: 2.77 u[IU]/mL (ref 0.35–5.50)

## 2024-04-17 LAB — VITAMIN B12: Vitamin B-12: 309 pg/mL (ref 211–911)

## 2024-04-21 ENCOUNTER — Ambulatory Visit (INDEPENDENT_AMBULATORY_CARE_PROVIDER_SITE_OTHER): Payer: BC Managed Care – PPO | Admitting: Family Medicine

## 2024-04-21 ENCOUNTER — Encounter: Payer: Self-pay | Admitting: Family Medicine

## 2024-04-21 VITALS — BP 120/80 | HR 73 | Temp 98.0°F | Ht 63.75 in | Wt 208.0 lb

## 2024-04-21 DIAGNOSIS — Z6835 Body mass index (BMI) 35.0-35.9, adult: Secondary | ICD-10-CM

## 2024-04-21 DIAGNOSIS — E1169 Type 2 diabetes mellitus with other specified complication: Secondary | ICD-10-CM

## 2024-04-21 DIAGNOSIS — E039 Hypothyroidism, unspecified: Secondary | ICD-10-CM | POA: Diagnosis not present

## 2024-04-21 DIAGNOSIS — Z0000 Encounter for general adult medical examination without abnormal findings: Secondary | ICD-10-CM

## 2024-04-21 DIAGNOSIS — Z794 Long term (current) use of insulin: Secondary | ICD-10-CM

## 2024-04-21 DIAGNOSIS — M8589 Other specified disorders of bone density and structure, multiple sites: Secondary | ICD-10-CM

## 2024-04-21 DIAGNOSIS — E66812 Obesity, class 2: Secondary | ICD-10-CM

## 2024-04-21 DIAGNOSIS — E1165 Type 2 diabetes mellitus with hyperglycemia: Secondary | ICD-10-CM

## 2024-04-21 DIAGNOSIS — E119 Type 2 diabetes mellitus without complications: Secondary | ICD-10-CM

## 2024-04-21 DIAGNOSIS — E785 Hyperlipidemia, unspecified: Secondary | ICD-10-CM

## 2024-04-21 DIAGNOSIS — I10 Essential (primary) hypertension: Secondary | ICD-10-CM

## 2024-04-21 DIAGNOSIS — Z79899 Other long term (current) drug therapy: Secondary | ICD-10-CM

## 2024-04-21 DIAGNOSIS — E559 Vitamin D deficiency, unspecified: Secondary | ICD-10-CM

## 2024-04-21 MED ORDER — ERGOCALCIFEROL 1.25 MG (50000 UT) PO CAPS: 50000.0000 [IU] | ORAL_CAPSULE | ORAL | 0 refills | Status: AC

## 2024-04-21 NOTE — Assessment & Plan Note (Signed)
 Reviewed health habits including diet and exercise and skin cancer prevention Reviewed appropriate screening tests for age  Also reviewed health mt list, fam hx and immunization status , as well as social and family history   See HPI Labs reviewed and ordered Health Maintenance  Topic Date Due   Zoster (Shingles) Vaccine (1 of 2) Never done   Pneumococcal Vaccination (2 of 2 - PCV) 02/04/2020   Eye exam for diabetics  01/12/2024   Hepatitis C Screening  04/21/2025*   HIV Screening  04/21/2025*   COVID-19 Vaccine (3 - Pfizer risk series) 11/05/2025*   Mammogram  05/29/2024   DEXA scan (bone density measurement)  05/29/2024   Flu Shot  07/17/2024   Hemoglobin A1C  10/18/2024   Yearly kidney health urinalysis for diabetes  10/20/2024   Complete foot exam   10/20/2024   Yearly kidney function blood test for diabetes  04/17/2025   Pap with HPV screening  05/29/2025   Colon Cancer Screening  12/28/2026   DTaP/Tdap/Td vaccine (3 - Td or Tdap) 02/03/2029   HPV Vaccine  Aged Out   Meningitis B Vaccine  Aged Out  *Topic was postponed. The date shown is not the original due date.    Declines hep C and hiv screen due to low risk  Considering shingrix vaccine  Eye exam scheduled this mo  Gyn visit this summer-sent for last mam and pap reports  Dexa planned this summer  Discussed fall prevention, supplements and exercise for bone density  Utd derm care  PHQ 3   on treatment

## 2024-04-21 NOTE — Assessment & Plan Note (Signed)
 bp in fair control at this time  BP Readings from Last 1 Encounters:  04/21/24 120/80   No changes needed Most recent labs reviewed  Disc lifstyle change with low sodium diet and exercise  Plan to continue Losartan  hct 100-25 mg daily  Metoprolol  xl 50 mg daily

## 2024-04-21 NOTE — Assessment & Plan Note (Signed)
 Discussed how this problem influences overall health and the risks it imposes  Reviewed plan for weight loss with lower calorie diet (via better food choices (lower glycemic and portion control) along with exercise building up to or more than 30 minutes 5 days per week including some aerobic activity and strength training    Difficult situation in light of extremely picky eating  Has seen dietician in past Encouraged to add strength training  Trulicity  has not made much of a difference recently

## 2024-04-21 NOTE — Assessment & Plan Note (Signed)
 Disc goals for lipids and reasons to control them Rev last labs with pt Rev low sat fat diet in detail  Stable LDL of 79-this is up and not quite at goal Per pt ate fried food/fatty meal right before draw  Plan to continue crestor  5 mg daily and good diet  Will re check lipids in 3 months Consider increase statin if needed

## 2024-04-21 NOTE — Progress Notes (Signed)
 Subjective:    Patient ID: Diana Deleon, female    DOB: September 02, 1961, 63 y.o.   MRN: [id number]  HPI  Here for health maintenance exam and to review chronic medical problems   Wt Readings from Last 3 Encounters:  04/21/24 208 lb (94.3 kg)  01/23/24 205 lb 6 oz (93.2 kg)  10/21/23 212 lb 2 oz (96.2 kg)   35.98 kg/m  Vitals:   04/21/24 0819  BP: 120/80  Pulse: 73  Temp: 98 F (36.7 C)  SpO2: 91%    Immunization History  Administered Date(s) Administered   Influenza Split 08/23/2021   Influenza,inj,Quad PF,6+ Mos 10/14/2013, 12/25/2017, 10/31/2018, 12/23/2019, 11/03/2020, 10/04/2022   Influenza-Unspecified 09/29/2014, 10/10/2015, 09/29/2016, 10/10/2023   PFIZER(Purple Top)SARS-COV-2 Vaccination 03/26/2020, 04/19/2020   Pneumococcal Polysaccharide-23 02/03/2019   Td 06/16/2009   Tdap 02/03/2019    Health Maintenance Due  Topic Date Due   Zoster Vaccines- Shingrix (1 of 2) Never done   Pneumococcal Vaccine 58-36 Years old (2 of 2 - PCV) 02/04/2020   OPHTHALMOLOGY EXAM  01/12/2024   Hep c screen / HIV  Declines / low risk   Shingrix -is interested / will ask about coverage and talk to dermatology   Eye exam 12/2022, has it scheduled on 04/2024   Mammogram [facility]/2022 [facility]  Next appointment is in aug  Personal history of breast cancer  Self breast exam- no lumps or change   Gyn health Pap 05/2022 History of HSV Takes valtrex  prn    Colon cancer screening  Colonoscopy 12/2016 - 10 y recall   Bone health  Dexa 05/2022 at gyn- will get this summer  Osteopenia  Alendronate  - on a drug holiday  Falls-none  Fractures-none  Supplements  Last vitamin D  Lab Results  Component Value Date   VD25OH <7.00 (L) 04/17/2024    Struggles with ca and D -makes her sick / nausea   Exercise :  Chair yoga    Sees dermatology  History of melanoma in situ in the past  Has random moles removed/ squamous or basal?  Using sun protection    Psoriasis  Takes  skyrizi   Mood    04/21/2024    8:24 AM 01/23/2024    8:21 AM 10/21/2023    8:44 AM 04/18/2023    9:17 AM 10/04/2022    8:38 AM  Depression screen PHQ 2/9  Decreased Interest 0 1 0 0 0  Down, Depressed, Hopeless 0 0 0 0 0  PHQ - 2 Score 0 1 0 0 0  Altered sleeping 1 1 0 0 1  Tired, decreased energy 1 1 1 1 1   Change in appetite 1 2 1 1 1   Feeling bad or failure about yourself  0 0 0 0 0  Trouble concentrating 0 0 0 0 0  Moving slowly or fidgety/restless 0 0 0 0 0  Suicidal thoughts 0 0 0 0 0  PHQ-9 Score 3 5 2 2 3   Difficult doing work/chores Not difficult at all Not difficult at all Not difficult at all Not difficult at all    Effexor  xr 75 mg daily    HTN bp is stable today  No cp or palpitations or headaches or edema  No side effects to medicines  BP Readings from Last 3 Encounters:  04/21/24 120/80  01/23/24 124/72  10/21/23 124/78    Metoprolol  xl 50 mg daily  Losartan  hct 100-25 mg daily   Klor con 10 meq daily  CKD 3a Lab Results  Component Value Date   NA 140 04/17/2024   K 3.8 04/17/2024   CO2 32 04/17/2024   GLUCOSE 168 (H) 04/17/2024   BUN 13 04/17/2024   CREATININE 1.13 04/17/2024   CALCIUM  9.4 04/17/2024   GFR 52.55 (L) 04/17/2024   EGFR 75 (L) 06/27/2015   GFRNONAA >60 02/14/2022   Needs to drink more water     DM2 Lab Results  Component Value Date   HGBA1C 7.8 (H) 04/17/2024   HGBA1C 8.8 (A) 01/23/2024   HGBA1C 7.9 (A) 10/21/2023   Trulicity  4.5 mg weekly  Glipizide  xl 10 mg daily  Semglee  75 u daily   Lowest sugar in high 90s  Feels low at 97   Missed her pharmacy appointment unfortunately  Very picky eater     Lab Results  Component Value Date   MICROALBUR <0.7 10/21/2023   MICROALBUR <0.7 10/04/2022  Ratio of 4 in November  On a statin and arb   Hyperlipidemia  Lab Results  Component Value Date   CHOL 147 04/17/2024   CHOL 139 04/11/2023   CHOL 144 10/04/2022   Lab Results  Component Value Date   HDL 49.30  04/17/2024   HDL 51.90 04/11/2023   HDL 53.40 10/04/2022   Lab Results  Component Value Date   LDLCALC 79 04/17/2024   LDLCALC 68 04/11/2023   LDLCALC 64 10/04/2022   Lab Results  Component Value Date   TRIG 93.0 04/17/2024   TRIG 95.0 04/11/2023   TRIG 131.0 10/04/2022   Lab Results  Component Value Date   CHOLHDL 3 04/17/2024   CHOLHDL 3 04/11/2023   CHOLHDL 3 10/04/2022   No results found for: "LDLDIRECT" Crestor  5 mg daily  Has not eaten fatty foods  Occational fried foods or red meat  Did eat [place] food around time of draw   Lab Results  Component Value Date   ALT 17 04/17/2024   AST 18 04/17/2024   ALKPHOS 94 04/17/2024   BILITOT 0.6 04/17/2024     Hypothyroidism  Pt has no clinical changes No change in energy level/ hair or skin/ edema and no tremor Lab Results  Component Value Date   TSH 2.77 04/17/2024    Levothyroxine  50 mcg daily   Omeprazole  20 mg daily  Lab Results  Component Value Date   VITAMINB12 309 04/17/2024    Lab Results  Component Value Date   WBC 5.8 04/17/2024   HGB 15.3 (H) 04/17/2024   HCT 46.1 (H) 04/17/2024   MCV 93.9 04/17/2024   PLT 237.0 04/17/2024       Patient Active Problem List   Diagnosis Date Noted   Diabetes mellitus type 2, insulin  dependent (HCC) 08/12/2023   Chronic kidney disease (CKD) stage G3a/A1, moderately decreased glomerular filtration rate (GFR) between 45-59 mL/min/1.73 square meter and albuminuria creatinine ratio less than 30 mg/g (HCC) 04/18/2023   History of melanoma in situ 04/18/2023   Urinary frequency 04/18/2023   Vitamin D  deficiency 04/08/2023   Restless legs 04/04/2022   S/P TKR (total knee replacement) using cement, right 02/13/2022   Current use of proton pump inhibitor 05/09/2021   S/P TKR (total knee replacement) using cement, left 06/21/2020   Osteoarthritis 04/28/2020   Hyperlipidemia associated with type 2 diabetes mellitus (HCC) 11/02/2018   Low back pain 03/21/2018    Osteopenia 01/05/2018   Arthralgia 07/03/2017   Chronic pain of left knee 07/03/2017   Psoriasis 11/02/2016   Colon cancer screening 11/02/2016  Class 2 severe obesity due to excess calories with serious comorbidity and body mass index (BMI) of 35.0 to 35.9 in adult [facility]) 10/31/2015   History of breast cancer 10/25/2014   Type 2 diabetes mellitus with hyperglycemia (HCC) 07/14/2013   Vaginal intraepithelial neoplasia grade 1 07/09/2013   Routine general medical examination at a health care facility 10/12/2012   Hypothyroidism 06/03/2012   Overactive bladder 05/27/2012   HSV infection 07/07/2010   LOW BACK PAIN, CHRONIC 05/17/2008   Headache 05/17/2008   Essential hypertension 02/12/2008   Past Medical History:  Diagnosis Date   Anemia    "off and on"   Arthritis    "right toes" (03/24/2015)   Cancer of right breast (HCC) 2015   Cyst of cystic duct    chest- central location, taking Cephalexin  currently   Diabetes mellitus without complication (HCC)    Fibroids    hyst. 2006   GERD (gastroesophageal reflux disease)    Headache    History of abnormal Pap smear    has had leep in past   History of cold sores    has used valtrex  in past   History of kidney stones    History of recurrent UTIs    Hypertension    Hypothyroidism    Intermittent vertigo    Seborrheic dermatitis    local on R scalp and in ear (failed mult meds- uses steroid spray)   Thyroid  disease    Past Surgical History:  Procedure Laterality Date   BREAST RECONSTRUCTION WITH PLACEMENT OF TISSUE EXPANDER AND FLEX HD (ACELLULAR HYDRATED DERMIS) Right 11/25/2014   Procedure: RIGHT BREAST RECONSTRUCTION WITH PLACEMENT OF TISSUE EXPANDER AND USE OF ACELLULAR DERMRAL MATRIX ;  Surgeon: Elidia Grout, MD;  Location: [facility] OR;  Service: Plastics;  Laterality: Right;   BREAST REDUCTION SURGERY Left 03/24/2015   Procedure: LEFT BREAST REDUCTION  (BREAST);  Surgeon: Elidia Grout, MD;  Location: [facility] OR;  Service: Plastics;   Laterality: Left;   BREAST SURGERY Right    mastectomy  lymph node removal   COLONOSCOPY     COLONOSCOPY WITH PROPOFOL  N/A 12/28/2016   Procedure: COLONOSCOPY WITH PROPOFOL ;  Surgeon: Luke Salaam, MD;  Location: [facility] ENDOSCOPY;  Service: Endoscopy;  Laterality: N/A;   LAPAROSCOPIC CHOLECYSTECTOMY  2003   LITHOTRIPSY  ~ 2007   REMOVAL OF TISSUE EXPANDER Right 03/24/2015   REMOVAL OF TISSUE EXPANDER AND PLACEMENT OF IMPLANT Right 03/24/2015   w/delayed construction   REMOVAL OF TISSUE EXPANDER AND PLACEMENT OF IMPLANT Right 03/24/2015   Procedure: REMOVAL OF RIGHT BREAST TISSUE EXPANDER AND DELAYED BREAST RECONSTRUCTION WITH PLACEMENT OF IMPLANT;  Surgeon: Elidia Grout, MD;  Location: [facility] OR;  Service: Plastics;  Laterality: Right;   SIMPLE MASTECTOMY WITH AXILLARY SENTINEL NODE BIOPSY Right 11/25/2014   Procedure: RIGHT TOTAL  MASTECTOMY WITH AXILLARY SENTINEL NODE BIOPSY ;  Surgeon: Ayesha Lente, MD;  Location: [facility] OR;  Service: General;  Laterality: Right;   TOTAL KNEE ARTHROPLASTY Left 06/21/2020   Procedure: LEFT TOTAL KNEE ARTHROPLASTY;  Surgeon: Molli Angelucci, MD;  Location: [facility] ORS;  Service: Orthopedics;  Laterality: Left;   TOTAL KNEE ARTHROPLASTY Right 02/13/2022   Procedure: TOTAL KNEE ARTHROPLASTY;  Surgeon: Molli Angelucci, MD;  Location: [facility] ORS;  Service: Orthopedics;  Laterality: Right;   TUBAL LIGATION     VAGINAL DELIVERY  x3   VAGINAL HYSTERECTOMY  2006   "fibroids"   Social History   Tobacco Use   Smoking status: Never   Smokeless tobacco: Never  Vaping Use   Vaping status: Never Used  Substance Use Topics   Alcohol use: No    Alcohol/week: 0.0 standard drinks of alcohol   Drug use: No   Family History  Problem Relation Age of Onset   Alcohol abuse Father    COPD Father    Hypertension Father    Hypertension Mother    Parkinson's disease Mother    Allergies  Allergen Reactions   Caltrate 600+D Plus Minerals [Calcium  600 + Minerals]     Nausea    Metformin   And Related Diarrhea   Norvasc [Amlodipine Besylate] Swelling   Current Outpatient Medications on File Prior to Visit  Medication Sig Dispense Refill   Calcium  Carb-Cholecalciferol  600-800 MG-UNIT TABS Take 2 tablets by mouth every evening.     Continuous Glucose Receiver (DEXCOM G6 RECEIVER) DEVI Use as directed for diabetes 1 each 0   Continuous Glucose Sensor (DEXCOM G6 SENSOR) MISC 1 each by Does not apply route as directed. 3 each 11   Continuous Glucose Transmitter (DEXCOM G6 TRANSMITTER) MISC Use as directed for diabetes 1 each 3   Dulaglutide  (TRULICITY ) 4.5 MG/0.5ML SOAJ INJECT 4.5 MG AS DIRECTED ONCE A WEEK. 6 mL 1   gabapentin  (NEURONTIN ) 300 MG capsule TAKE 1 CAPSULE BY MOUTH EVERYDAY AT BEDTIME 90 capsule 0   glipiZIDE  (GLUCOTROL  XL) 10 MG 24 hr tablet TAKE 1 TABLET (10 MG TOTAL) BY MOUTH DAILY WITH BREAKFAST. 90 tablet 2   glucose blood (ONETOUCH VERIO) test strip USE TO CHECK BLOOD SUGAR ONCE DAILY AND AS NEEDED 100 each 2   insulin  glargine (SEMGLEE ) 100 UNIT/ML injection Inject 0.75 mLs (75 Units total) into the skin daily. 70 mL 1   levothyroxine  (SYNTHROID ) 50 MCG tablet TAKE 1 TABLET BY MOUTH EVERY DAY BEFORE BREAKFAST 90 tablet 2   losartan -hydrochlorothiazide  (HYZAAR) 100-25 MG tablet TAKE 1 TABLET BY MOUTH EVERY DAY 90 tablet 3   metoprolol  succinate (TOPROL -XL) 50 MG 24 hr tablet TAKE 1 TABLET BY MOUTH EVERY DAY WITH OR IMMEDIATELY FOLLOWING A MEAL 90 tablet 3   Multiple Vitamin (MULTIVITAMIN WITH MINERALS) TABS tablet Take 1 tablet by mouth every evening.     omeprazole  (PRILOSEC ) 20 MG capsule TAKE 1 CAPSULE BY MOUTH DAILY BEFORE BREAKFAST 90 capsule 3   ONETOUCH DELICA LANCETS FINE MISC Use to check blood sugar once daily and as needed (dx. E11.9) 100 each 1   potassium chloride  (KLOR-CON ) 10 MEQ tablet TAKE 1 TABLET BY MOUTH EVERY DAY 90 tablet 3   Risankizumab-rzaa (SKYRIZI, 150 MG DOSE, [place]) Inject 150 mcg into the skin every 3 (three) months. Take as directed (every  3 month)     rosuvastatin  (CRESTOR ) 5 MG tablet TAKE 1 TABLET (5 MG TOTAL) BY MOUTH DAILY. 90 tablet 3   valACYclovir  (VALTREX ) 500 MG tablet TAKE 1 TABLET(500 MG) BY MOUTH TWICE DAILY FOR 5 DAYS FOR OUTBREAK 10 tablet 3   venlafaxine  XR (EFFEXOR -XR) 75 MG 24 hr capsule TAKE 1 CAPSULE(75 MG) BY MOUTH DAILY WITH BREAKFAST 90 capsule 3   No current facility-administered medications on file prior to visit.    Review of Systems  Constitutional:  Negative for activity change, appetite change, fatigue, fever and unexpected weight change.  HENT:  Negative for congestion, ear pain, rhinorrhea, sinus pressure and sore throat.   Eyes:  Negative for pain, redness and visual disturbance.  Respiratory:  Negative for cough, shortness of breath and wheezing.   Cardiovascular:  Negative for chest pain and palpitations.  Gastrointestinal:  Negative for abdominal pain, blood in stool, constipation and diarrhea.  Endocrine: Negative for polydipsia and polyuria.  Genitourinary:  Negative for dysuria, frequency and urgency.  Musculoskeletal:  Negative for arthralgias, back pain and myalgias.  Skin:  Negative for pallor and rash.  Allergic/Immunologic: Negative for environmental allergies.  Neurological:  Negative for dizziness, syncope and headaches.  Hematological:  Negative for adenopathy. Does not bruise/bleed easily.  Psychiatric/Behavioral:  Negative for decreased concentration and dysphoric mood. The patient is not nervous/anxious.        Objective:   Physical Exam Constitutional:      General: She is not in acute distress.    Appearance: Normal appearance. She is well-developed. She is obese. She is not ill-appearing or diaphoretic.  HENT:     Head: Normocephalic and atraumatic.     Right Ear: Tympanic membrane, ear canal and external ear normal.     Left Ear: Tympanic membrane, ear canal and external ear normal.     Nose: Nose normal. No congestion.     Mouth/Throat:     Mouth: Mucous  membranes are moist.     Pharynx: Oropharynx is clear. No posterior oropharyngeal erythema.  Eyes:     General: No scleral icterus.    Extraocular Movements: Extraocular movements intact.     Conjunctiva/sclera: Conjunctivae normal.     Pupils: Pupils are equal, round, and reactive to light.  Neck:     Thyroid : No thyromegaly.     Vascular: No carotid bruit or JVD.  Cardiovascular:     Rate and Rhythm: Normal rate and regular rhythm.     Pulses: Normal pulses.     Heart sounds: Normal heart sounds.     No gallop.  Pulmonary:     Effort: Pulmonary effort is normal. No respiratory distress.     Breath sounds: Normal breath sounds. No wheezing.     Comments: Good air exch Chest:     Chest wall: No tenderness.  Abdominal:     General: Bowel sounds are normal. There is no distension or abdominal bruit.     Palpations: Abdomen is soft. There is no mass.     Tenderness: There is no abdominal tenderness.     Hernia: No hernia is present.  Genitourinary:    Comments: Breast exam: No mass, nodules, thickening, tenderness, bulging, retraction, inflamation, nipple discharge or skin changes noted.  No axillary or clavicular LA.     Musculoskeletal:        General: No tenderness. Normal range of motion.     Cervical back: Normal range of motion and neck supple. No rigidity. No muscular tenderness.     Right lower leg: No edema.     Left lower leg: No edema.     Comments: No kyphosis   Lymphadenopathy:     Cervical: No cervical adenopathy.  Skin:    General: Skin is warm and dry.     Coloration: Skin is not pale.     Findings: No erythema or rash.     Comments: Solar lentigines diffusely   Neurological:     Mental Status: She is alert. Mental status is at baseline.     Cranial Nerves: No cranial nerve deficit.     Motor: No abnormal muscle tone.     Coordination: Coordination normal.     Gait: Gait normal.     Deep Tendon Reflexes: Reflexes are normal and symmetric. Reflexes normal.   Psychiatric:        Mood and Affect:  Mood normal.        Cognition and Memory: Cognition and memory normal.           Assessment & Plan:   Problem List Items Addressed This Visit       Cardiovascular and Mediastinum   Essential hypertension   bp in fair control at this time  BP Readings from Last 1 Encounters:  04/21/24 120/80   No changes needed Most recent labs reviewed  Disc lifstyle change with low sodium diet and exercise  Plan to continue Losartan  hct 100-25 mg daily  Metoprolol  xl 50 mg daily         Endocrine   Hypothyroidism   Hypothyroidism  Pt has no clinical changes No change in energy level/ hair or skin/ edema and no tremor Lab Results  Component Value Date   TSH 2.77 04/17/2024    Continues levothyroxine  50 mcg daily      Hyperlipidemia associated with type 2 diabetes mellitus (HCC)   Disc goals for lipids and reasons to control them Rev last labs with pt Rev low sat fat diet in detail  Stable LDL of 79-this is up and not quite at goal Per pt ate fried food/fatty meal right before draw  Plan to continue crestor  5 mg daily and good diet  Will re check lipids in 3 months Consider increase statin if needed         Diabetes mellitus type 2, insulin  dependent (HCC)   Lab Results  Component Value Date   HGBA1C 7.8 (H) 04/17/2024   HGBA1C 8.8 (A) 01/23/2024   HGBA1C 7.9 (A) 10/21/2023   Slowly improving  Trulicity  4.5 mg weekly  Glipizide  xl 10 mg daily  Semglee  75 u daily   Planning a pharmacy visit-strongly encouraged pt to call and set this up  Very picky eater  Unsure if ins would cover a different glp-1 med that may help more with weight loss Microalb utd On statin and arb  Pt plans eye exam later this mo/is scheduled A1c planned 3 mo        Musculoskeletal and Integument   Osteopenia   Dexa 2023 at gyn-will have another one this summer  No falls or fracture  Prescription high dose vit D for very low level   Discussed  fall prevention, supplements and exercise for bone density          Other   Vitamin D  deficiency   Routine general medical examination at a health care facility - Primary   Reviewed health habits including diet and exercise and skin cancer prevention Reviewed appropriate screening tests for age  Also reviewed health mt list, fam hx and immunization status , as well as social and family history   See HPI Labs reviewed and ordered Health Maintenance  Topic Date Due   Zoster (Shingles) Vaccine (1 of 2) Never done   Pneumococcal Vaccination (2 of 2 - PCV) 02/04/2020   Eye exam for diabetics  01/12/2024   Hepatitis C Screening  04/21/2025*   HIV Screening  04/21/2025*   COVID-19 Vaccine (3 - Pfizer risk series) 11/05/2025*   Mammogram  05/29/2024   DEXA scan (bone density measurement)  05/29/2024   Flu Shot  07/17/2024   Hemoglobin A1C  10/18/2024   Yearly kidney health urinalysis for diabetes  10/20/2024   Complete foot exam   10/20/2024   Yearly kidney function blood test for diabetes  04/17/2025   Pap with HPV screening  05/29/2025   Colon Cancer  Screening  12/28/2026   DTaP/Tdap/Td vaccine (3 - Td or Tdap) 02/03/2029   HPV Vaccine  Aged Out   Meningitis B Vaccine  Aged Out  *Topic was postponed. The date shown is not the original due date.    Declines hep C and hiv screen due to low risk  Considering shingrix vaccine  Eye exam scheduled this mo  Gyn visit this summer-sent for last mam and pap reports  Dexa planned this summer  Discussed fall prevention, supplements and exercise for bone density  Utd derm care  PHQ 3   on treatment       Current use of proton pump inhibitor   Lab Results  Component Value Date   VITAMINB12 309 04/17/2024   Last vitamin D  Lab Results  Component Value Date   VD25OH <7.00 (L) 04/17/2024   Plan to start high dose vitamin D       Class 2 severe obesity due to excess calories with serious comorbidity and body mass index (BMI) of 35.0  to 35.9 in adult [facility])   Discussed how this problem influences overall health and the risks it imposes  Reviewed plan for weight loss with lower calorie diet (via better food choices (lower glycemic and portion control) along with exercise building up to or more than 30 minutes 5 days per week including some aerobic activity and strength training    Difficult situation in light of extremely picky eating  Has seen dietician in past Encouraged to add strength training  Trulicity  has not made much of a difference recently

## 2024-04-21 NOTE — Assessment & Plan Note (Signed)
 Lab Results  Component Value Date   HGBA1C 7.8 (H) 04/17/2024   HGBA1C 8.8 (A) 01/23/2024   HGBA1C 7.9 (A) 10/21/2023   Slowly improving  Trulicity  4.5 mg weekly  Glipizide  xl 10 mg daily  Semglee  75 u daily   Planning a pharmacy visit-strongly encouraged pt to call and set this up  Very picky eater  Unsure if ins would cover a different glp-1 med that may help more with weight loss Microalb utd On statin and arb  Pt plans eye exam later this mo/is scheduled A1c planned 3 mo

## 2024-04-21 NOTE — Assessment & Plan Note (Signed)
 Dexa 2023 at gyn-will have another one this summer  No falls or fracture  Prescription high dose vit D for very low level   Discussed fall prevention, supplements and exercise for bone density

## 2024-04-21 NOTE — Assessment & Plan Note (Signed)
 Lab Results  Component Value Date   VITAMINB12 309 04/17/2024   Last vitamin D  Lab Results  Component Value Date   VD25OH <7.00 (L) 04/17/2024   Plan to start high dose vitamin D 

## 2024-04-21 NOTE — Assessment & Plan Note (Signed)
 Hypothyroidism  Pt has no clinical changes No change in energy level/ hair or skin/ edema and no tremor Lab Results  Component Value Date   TSH 2.77 04/17/2024    Continues levothyroxine  50 mcg daily

## 2024-04-21 NOTE — Patient Instructions (Addendum)
 If you are interested in the shingles vaccine series (Shingrix), call your insurance or pharmacy to check on coverage and location it must be given.  If affordable - you can schedule it here or at your pharmacy depending on coverage    Take 4000 international units daily of vitamin D3 over the counter  Take the high dose weekly supplement for 12 weeks in addition to that   Please keep up a good fluid intake for kidney health  Avoid nsaids   Call to re schedule your pharmacist appointment - # should be in mychart message   For cholesterol  Avoid red meat/ fried foods/ egg yolks/ fatty breakfast meats/ butter, cheese and high fat dairy/ and shellfish    Let's re check vitamin D  and cholesterol and A1c in 3 months

## 2024-04-23 ENCOUNTER — Encounter: Payer: Self-pay | Admitting: Family Medicine

## 2024-04-28 DIAGNOSIS — H35033 Hypertensive retinopathy, bilateral: Secondary | ICD-10-CM | POA: Diagnosis not present

## 2024-05-10 ENCOUNTER — Other Ambulatory Visit: Payer: Self-pay | Admitting: Family Medicine

## 2024-06-12 ENCOUNTER — Other Ambulatory Visit: Payer: Self-pay | Admitting: Family Medicine

## 2024-06-14 ENCOUNTER — Inpatient Hospital Stay

## 2024-06-14 ENCOUNTER — Emergency Department

## 2024-06-14 ENCOUNTER — Inpatient Hospital Stay
Admission: EM | Admit: 2024-06-14 | Discharge: 2024-06-16 | DRG: 440 | Disposition: A | Discharge status: Home / Self Care | Attending: Internal Medicine | Admitting: Internal Medicine

## 2024-06-14 ENCOUNTER — Encounter: Payer: Self-pay | Admitting: Emergency Medicine

## 2024-06-14 ENCOUNTER — Other Ambulatory Visit: Payer: Self-pay

## 2024-06-14 DIAGNOSIS — N179 Acute kidney failure, unspecified: Secondary | ICD-10-CM | POA: Diagnosis not present

## 2024-06-14 DIAGNOSIS — R1013 Epigastric pain: Secondary | ICD-10-CM | POA: Diagnosis not present

## 2024-06-14 DIAGNOSIS — Z825 Family history of asthma and other chronic lower respiratory diseases: Secondary | ICD-10-CM | POA: Diagnosis not present

## 2024-06-14 DIAGNOSIS — Z7984 Long term (current) use of oral hypoglycemic drugs: Secondary | ICD-10-CM

## 2024-06-14 DIAGNOSIS — E039 Hypothyroidism, unspecified: Secondary | ICD-10-CM | POA: Diagnosis present

## 2024-06-14 DIAGNOSIS — R079 Chest pain, unspecified: Secondary | ICD-10-CM | POA: Diagnosis not present

## 2024-06-14 DIAGNOSIS — Z853 Personal history of malignant neoplasm of breast: Secondary | ICD-10-CM

## 2024-06-14 DIAGNOSIS — E1169 Type 2 diabetes mellitus with other specified complication: Secondary | ICD-10-CM | POA: Diagnosis present

## 2024-06-14 DIAGNOSIS — R933 Abnormal findings on diagnostic imaging of other parts of digestive tract: Secondary | ICD-10-CM | POA: Diagnosis present

## 2024-06-14 DIAGNOSIS — Z7985 Long-term (current) use of injectable non-insulin antidiabetic drugs: Secondary | ICD-10-CM | POA: Diagnosis not present

## 2024-06-14 DIAGNOSIS — M8580 Other specified disorders of bone density and structure, unspecified site: Secondary | ICD-10-CM | POA: Diagnosis not present

## 2024-06-14 DIAGNOSIS — Z86006 Personal history of melanoma in-situ: Secondary | ICD-10-CM | POA: Diagnosis not present

## 2024-06-14 DIAGNOSIS — Z87442 Personal history of urinary calculi: Secondary | ICD-10-CM

## 2024-06-14 DIAGNOSIS — E876 Hypokalemia: Secondary | ICD-10-CM | POA: Diagnosis not present

## 2024-06-14 DIAGNOSIS — K5730 Diverticulosis of large intestine without perforation or abscess without bleeding: Secondary | ICD-10-CM | POA: Diagnosis not present

## 2024-06-14 DIAGNOSIS — E66812 Obesity, class 2: Secondary | ICD-10-CM | POA: Diagnosis present

## 2024-06-14 DIAGNOSIS — C50911 Malignant neoplasm of unspecified site of right female breast: Secondary | ICD-10-CM | POA: Diagnosis present

## 2024-06-14 DIAGNOSIS — Z820 Family history of epilepsy and other diseases of the nervous system: Secondary | ICD-10-CM

## 2024-06-14 DIAGNOSIS — K219 Gastro-esophageal reflux disease without esophagitis: Secondary | ICD-10-CM | POA: Diagnosis not present

## 2024-06-14 DIAGNOSIS — N200 Calculus of kidney: Secondary | ICD-10-CM | POA: Diagnosis not present

## 2024-06-14 DIAGNOSIS — Z8249 Family history of ischemic heart disease and other diseases of the circulatory system: Secondary | ICD-10-CM

## 2024-06-14 DIAGNOSIS — Z888 Allergy status to other drugs, medicaments and biological substances status: Secondary | ICD-10-CM

## 2024-06-14 DIAGNOSIS — Z87440 Personal history of urinary (tract) infections: Secondary | ICD-10-CM | POA: Diagnosis not present

## 2024-06-14 DIAGNOSIS — K859 Acute pancreatitis without necrosis or infection, unspecified: Secondary | ICD-10-CM | POA: Diagnosis not present

## 2024-06-14 DIAGNOSIS — Z79899 Other long term (current) drug therapy: Secondary | ICD-10-CM

## 2024-06-14 DIAGNOSIS — Z96653 Presence of artificial knee joint, bilateral: Secondary | ICD-10-CM | POA: Diagnosis present

## 2024-06-14 DIAGNOSIS — Z6836 Body mass index (BMI) 36.0-36.9, adult: Secondary | ICD-10-CM | POA: Diagnosis not present

## 2024-06-14 DIAGNOSIS — Z9011 Acquired absence of right breast and nipple: Secondary | ICD-10-CM

## 2024-06-14 DIAGNOSIS — R935 Abnormal findings on diagnostic imaging of other abdominal regions, including retroperitoneum: Secondary | ICD-10-CM | POA: Diagnosis not present

## 2024-06-14 DIAGNOSIS — Z79890 Hormone replacement therapy (postmenopausal): Secondary | ICD-10-CM

## 2024-06-14 DIAGNOSIS — E119 Type 2 diabetes mellitus without complications: Secondary | ICD-10-CM | POA: Diagnosis not present

## 2024-06-14 DIAGNOSIS — I10 Essential (primary) hypertension: Secondary | ICD-10-CM | POA: Diagnosis not present

## 2024-06-14 DIAGNOSIS — R188 Other ascites: Secondary | ICD-10-CM | POA: Diagnosis not present

## 2024-06-14 DIAGNOSIS — Z9049 Acquired absence of other specified parts of digestive tract: Secondary | ICD-10-CM | POA: Diagnosis not present

## 2024-06-14 DIAGNOSIS — E785 Hyperlipidemia, unspecified: Secondary | ICD-10-CM | POA: Diagnosis not present

## 2024-06-14 DIAGNOSIS — D649 Anemia, unspecified: Secondary | ICD-10-CM | POA: Diagnosis present

## 2024-06-14 DIAGNOSIS — K760 Fatty (change of) liver, not elsewhere classified: Secondary | ICD-10-CM | POA: Diagnosis not present

## 2024-06-14 DIAGNOSIS — I700 Atherosclerosis of aorta: Secondary | ICD-10-CM | POA: Diagnosis not present

## 2024-06-14 DIAGNOSIS — R0789 Other chest pain: Secondary | ICD-10-CM | POA: Diagnosis not present

## 2024-06-14 DIAGNOSIS — R109 Unspecified abdominal pain: Secondary | ICD-10-CM | POA: Diagnosis not present

## 2024-06-14 LAB — LIPID PANEL
Cholesterol: 132 mg/dL (ref 0–200)
HDL: 56 mg/dL (ref >40)
LDL Cholesterol: 61 mg/dL (ref 0–99)
Total CHOL/HDL Ratio: 2.4 ratio
Triglycerides: 75 mg/dL (ref <150)
VLDL: 15 mg/dL (ref 0–40)

## 2024-06-14 LAB — CBC WITH DIFFERENTIAL/PLATELET
Abs Immature Granulocytes: 0.05 10*3/uL (ref 0.00–0.07)
Basophils Absolute: 0 10*3/uL (ref 0.0–0.1)
Basophils Relative: 0 %
Eosinophils Absolute: 0.2 10*3/uL (ref 0.0–0.5)
Eosinophils Relative: 2 %
HCT: 45.5 % (ref 36.0–46.0)
Hemoglobin: 15.9 g/dL — ABNORMAL HIGH (ref 12.0–15.0)
Immature Granulocytes: 1 %
Lymphocytes Relative: 14 %
Lymphs Abs: 1.5 10*3/uL (ref 0.7–4.0)
MCH: 31.4 pg (ref 26.0–34.0)
MCHC: 34.9 g/dL (ref 30.0–36.0)
MCV: 89.9 fL (ref 80.0–100.0)
Monocytes Absolute: 0.9 10*3/uL (ref 0.1–1.0)
Monocytes Relative: 8 %
Neutro Abs: 7.7 10*3/uL (ref 1.7–7.7)
Neutrophils Relative %: 75 %
Platelets: 236 10*3/uL (ref 150–400)
RBC: 5.06 MIL/uL (ref 3.87–5.11)
RDW: 12.8 % (ref 11.5–15.5)
WBC: 10.3 10*3/uL (ref 4.0–10.5)
nRBC: 0 % (ref 0.0–0.2)

## 2024-06-14 LAB — TROPONIN I (HIGH SENSITIVITY)
Troponin I (High Sensitivity): 5 ng/L (ref <18)
Troponin I (High Sensitivity): 4 ng/L (ref <18)

## 2024-06-14 LAB — COMPREHENSIVE METABOLIC PANEL WITH GFR
ALT: 16 U/L (ref 0–44)
AST: 19 U/L (ref 15–41)
Albumin: 3.8 g/dL (ref 3.5–5.0)
Alkaline Phosphatase: 91 U/L (ref 38–126)
Anion gap: 7 (ref 5–15)
BUN: 13 mg/dL (ref 8–23)
CO2: 27 mmol/L (ref 22–32)
Calcium: 8.8 mg/dL — ABNORMAL LOW (ref 8.9–10.3)
Chloride: 102 mmol/L (ref 98–111)
Creatinine, Ser: 1.17 mg/dL — ABNORMAL HIGH (ref 0.44–1.00)
GFR, Estimated: 53 mL/min — ABNORMAL LOW (ref >60)
Glucose, Bld: 188 mg/dL — ABNORMAL HIGH (ref 70–99)
Potassium: 3.1 mmol/L — ABNORMAL LOW (ref 3.5–5.1)
Sodium: 136 mmol/L (ref 135–145)
Total Bilirubin: 1.1 mg/dL (ref 0.0–1.2)
Total Protein: 7.5 g/dL (ref 6.5–8.1)

## 2024-06-14 LAB — LIPASE, BLOOD: Lipase: 1818 U/L — ABNORMAL HIGH (ref 11–51)

## 2024-06-14 LAB — GLUCOSE, CAPILLARY
Glucose-Capillary: 160 mg/dL — ABNORMAL HIGH (ref 70–99)
Glucose-Capillary: 172 mg/dL — ABNORMAL HIGH (ref 70–99)

## 2024-06-14 MED ORDER — ROSUVASTATIN CALCIUM 10 MG PO TABS
5.0000 mg | ORAL_TABLET | Freq: Every day | ORAL | Status: DC
  Administered 2024-06-14 – 2024-06-16 (×3): 5 mg via ORAL
  Filled 2024-06-14 (×3): qty 1

## 2024-06-14 MED ORDER — IOHEXOL 300 MG/ML  SOLN
100.0000 mL | Freq: Once | INTRAMUSCULAR | Status: AC | PRN
Start: 2024-06-14 — End: 2024-06-14
  Administered 2024-06-14: 100 mL via INTRAVENOUS

## 2024-06-14 MED ORDER — FAMOTIDINE IN NACL 20-0.9 MG/50ML-% IV SOLN
20.0000 mg | Freq: Once | INTRAVENOUS | Status: AC
  Administered 2024-06-14: 20 mg via INTRAVENOUS
  Filled 2024-06-14: qty 50

## 2024-06-14 MED ORDER — HEPARIN SODIUM (PORCINE) 5000 UNIT/ML IJ SOLN
5000.0000 [IU] | Freq: Three times a day (TID) | INTRAMUSCULAR | Status: DC
  Administered 2024-06-14 – 2024-06-16 (×6): 5000 [IU] via SUBCUTANEOUS
  Filled 2024-06-14 (×6): qty 1

## 2024-06-14 MED ORDER — ACETAMINOPHEN 650 MG RE SUPP: 650.0000 mg | Freq: Four times a day (QID) | RECTAL | Status: DC | PRN

## 2024-06-14 MED ORDER — VENLAFAXINE HCL ER 75 MG PO CP24
75.0000 mg | ORAL_CAPSULE | Freq: Every day | ORAL | Status: DC
  Administered 2024-06-15 – 2024-06-16 (×2): 75 mg via ORAL
  Filled 2024-06-14 (×2): qty 1

## 2024-06-14 MED ORDER — GABAPENTIN 300 MG PO CAPS
300.0000 mg | ORAL_CAPSULE | Freq: Every day | ORAL | Status: DC
  Administered 2024-06-14 – 2024-06-15 (×2): 300 mg via ORAL
  Filled 2024-06-14 (×2): qty 1

## 2024-06-14 MED ORDER — OXYCODONE HCL 5 MG PO TABS
5.0000 mg | ORAL_TABLET | ORAL | Status: DC | PRN
  Administered 2024-06-16 (×2): 5 mg via ORAL
  Filled 2024-06-14 (×2): qty 1

## 2024-06-14 MED ORDER — MORPHINE SULFATE (PF) 4 MG/ML IV SOLN
4.0000 mg | Freq: Once | INTRAVENOUS | Status: AC
  Administered 2024-06-14: 4 mg via INTRAVENOUS
  Filled 2024-06-14: qty 1

## 2024-06-14 MED ORDER — ONDANSETRON HCL 4 MG/2ML IJ SOLN
4.0000 mg | Freq: Once | INTRAMUSCULAR | Status: AC
  Administered 2024-06-14: 4 mg via INTRAVENOUS
  Filled 2024-06-14: qty 2

## 2024-06-14 MED ORDER — ONDANSETRON HCL 4 MG PO TABS: 4.0000 mg | ORAL_TABLET | Freq: Four times a day (QID) | ORAL | Status: DC | PRN

## 2024-06-14 MED ORDER — ONDANSETRON HCL 4 MG/2ML IJ SOLN: 4.0000 mg | Freq: Four times a day (QID) | INTRAMUSCULAR | Status: DC | PRN

## 2024-06-14 MED ORDER — SODIUM CHLORIDE 0.9 % IV SOLN: INTRAVENOUS | Status: AC

## 2024-06-14 MED ORDER — FENTANYL CITRATE PF 50 MCG/ML IJ SOSY
50.0000 ug | PREFILLED_SYRINGE | Freq: Once | INTRAMUSCULAR | Status: AC
  Administered 2024-06-14: 50 ug via INTRAVENOUS
  Filled 2024-06-14: qty 1

## 2024-06-14 MED ORDER — METOPROLOL SUCCINATE ER 50 MG PO TB24
50.0000 mg | ORAL_TABLET | Freq: Every day | ORAL | Status: DC
  Administered 2024-06-14 – 2024-06-16 (×3): 50 mg via ORAL
  Filled 2024-06-14 (×3): qty 1

## 2024-06-14 MED ORDER — LEVOTHYROXINE SODIUM 50 MCG PO TABS
50.0000 ug | ORAL_TABLET | Freq: Every day | ORAL | Status: DC
  Administered 2024-06-15 – 2024-06-16 (×2): 50 ug via ORAL
  Filled 2024-06-14 (×2): qty 1

## 2024-06-14 MED ORDER — HYDROMORPHONE HCL 1 MG/ML IJ SOLN
0.5000 mg | INTRAMUSCULAR | Status: DC | PRN
  Administered 2024-06-14 – 2024-06-15 (×3): 1 mg via INTRAVENOUS
  Filled 2024-06-14 (×3): qty 1

## 2024-06-14 MED ORDER — GADOBUTROL 1 MMOL/ML IV SOLN
9.0000 mL | Freq: Once | INTRAVENOUS | Status: AC | PRN
  Administered 2024-06-14: 9 mL via INTRAVENOUS

## 2024-06-14 MED ORDER — ALBUTEROL SULFATE (2.5 MG/3ML) 0.083% IN NEBU: 2.5000 mg | INHALATION_SOLUTION | RESPIRATORY_TRACT | Status: DC | PRN

## 2024-06-14 MED ORDER — INSULIN GLARGINE-YFGN 100 UNIT/ML ~~LOC~~ SOLN
30.0000 [IU] | Freq: Every day | SUBCUTANEOUS | Status: DC
  Administered 2024-06-14 – 2024-06-16 (×3): 30 [IU] via SUBCUTANEOUS
  Filled 2024-06-14 (×3): qty 0.3

## 2024-06-14 MED ORDER — INSULIN ASPART 100 UNIT/ML IJ SOLN
0.0000 [IU] | Freq: Three times a day (TID) | INTRAMUSCULAR | Status: DC
  Administered 2024-06-14: 1 [IU] via SUBCUTANEOUS
  Filled 2024-06-14: qty 1

## 2024-06-14 MED ORDER — POTASSIUM CHLORIDE 10 MEQ/100ML IV SOLN
10.0000 meq | Freq: Once | INTRAVENOUS | Status: AC
  Administered 2024-06-14: 10 meq via INTRAVENOUS
  Filled 2024-06-14: qty 100

## 2024-06-14 MED ORDER — PANTOPRAZOLE SODIUM 40 MG PO TBEC
40.0000 mg | DELAYED_RELEASE_TABLET | Freq: Every day | ORAL | Status: DC
  Administered 2024-06-14 – 2024-06-16 (×3): 40 mg via ORAL
  Filled 2024-06-14 (×3): qty 1

## 2024-06-14 MED ORDER — ACETAMINOPHEN 325 MG PO TABS
650.0000 mg | ORAL_TABLET | Freq: Four times a day (QID) | ORAL | Status: DC | PRN
  Administered 2024-06-14 – 2024-06-15 (×2): 650 mg via ORAL
  Filled 2024-06-14 (×2): qty 2

## 2024-06-14 NOTE — ED Provider Notes (Signed)
 [facility] Provider Note    Event Date/Time   First MD Initiated Contact with Patient 06/14/24 [phone]     (approximate)   History   Chest Pain   HPI  Diana Deleon is a 63 y.o. female who presents to the ED from home with a chief complaint of epigastric/substernal chest pain which began yesterday, waxing/waning and awoke patient up prior to arrival.  Endorses associated nausea.  Patient notes she had headache yesterday as well which has since resolved.  Denies associated fever/chills, shortness of breath, vomiting, dysuria or diarrhea.     Past Medical History   Past Medical History:  Diagnosis Date   Anemia    off and on   Arthritis    right toes (03/24/2015)   Cancer of right breast (HCC) 2015   Cyst of cystic duct    chest- central location, taking Cephalexin  currently   Diabetes mellitus without complication (HCC)    Fibroids    hyst. 2006   GERD (gastroesophageal reflux disease)    Headache    History of abnormal Pap smear    has had leep in past   History of cold sores    has used valtrex  in past   History of kidney stones    History of recurrent UTIs    Hypertension    Hypothyroidism    Intermittent vertigo    Seborrheic dermatitis    local on R scalp and in ear (failed mult meds- uses steroid spray)   Thyroid  disease      Active Problem List   Patient Active Problem List   Diagnosis Date Noted   Diabetes mellitus type 2, insulin  dependent (HCC) 08/12/2023   Chronic kidney disease (CKD) stage G3a/A1, moderately decreased glomerular filtration rate (GFR) between 45-59 mL/min/1.73 square meter and albuminuria creatinine ratio less than 30 mg/g (HCC) 04/18/2023   History of melanoma in situ 04/18/2023   Urinary frequency 04/18/2023   Vitamin D  deficiency 04/08/2023   Restless legs 04/04/2022   S/P TKR (total knee replacement) using cement, right 02/13/2022   Current use of proton pump inhibitor 05/09/2021   S/P TKR (total knee  replacement) using cement, left 06/21/2020   Osteoarthritis 04/28/2020   Hyperlipidemia associated with type 2 diabetes mellitus (HCC) 11/02/2018   Low back pain 03/21/2018   Osteopenia 01/05/2018   Arthralgia 07/03/2017   Chronic pain of left knee 07/03/2017   Psoriasis 11/02/2016   Colon cancer screening 11/02/2016   Class 2 severe obesity due to excess calories with serious comorbidity and body mass index (BMI) of 35.0 to 35.9 in adult [facility]) 10/31/2015   History of breast cancer 10/25/2014   Type 2 diabetes mellitus with hyperglycemia (HCC) 07/14/2013   Vaginal intraepithelial neoplasia grade 1 07/09/2013   Routine general medical examination at a health care facility 10/12/2012   Hypothyroidism 06/03/2012   Overactive bladder 05/27/2012   HSV infection 07/07/2010   LOW BACK PAIN, CHRONIC 05/17/2008   Headache 05/17/2008   Essential hypertension 02/12/2008     Past Surgical History   Past Surgical History:  Procedure Laterality Date   BREAST RECONSTRUCTION WITH PLACEMENT OF TISSUE EXPANDER AND FLEX HD (ACELLULAR HYDRATED DERMIS) Right 11/25/2014   Procedure: RIGHT BREAST RECONSTRUCTION WITH PLACEMENT OF TISSUE EXPANDER AND USE OF ACELLULAR DERMRAL MATRIX ;  Surgeon: Alm Sick, MD;  Location: [facility] OR;  Service: Plastics;  Laterality: Right;   BREAST REDUCTION SURGERY Left 03/24/2015   Procedure: LEFT BREAST REDUCTION  (BREAST);  Surgeon: Alm  Leora, MD;  Location: [facility] OR;  Service: Plastics;  Laterality: Left;   BREAST SURGERY Right    mastectomy  lymph node removal   COLONOSCOPY     COLONOSCOPY WITH PROPOFOL  N/A 12/28/2016   Procedure: COLONOSCOPY WITH PROPOFOL ;  Surgeon: Ruel Kung, MD;  Location: [facility] END[facility]COPY;  Service: Endoscopy;  Laterality: N/A;   LAPAROSCOPIC CHOLECYSTECTOMY  2003   LITHOTRIPSY  ~ 2007   REMOVAL OF TISSUE EXPANDER Right 03/24/2015   REMOVAL OF TISSUE EXPANDER AND PLACEMENT OF IMPLANT Right 03/24/2015   w/delayed construction   REMOVAL OF TISSUE EXPANDER  AND PLACEMENT OF IMPLANT Right 03/24/2015   Procedure: REMOVAL OF RIGHT BREAST TISSUE EXPANDER AND DELAYED BREAST RECONSTRUCTION WITH PLACEMENT OF IMPLANT;  Surgeon: Alm Leora, MD;  Location: [facility] OR;  Service: Plastics;  Laterality: Right;   SIMPLE MASTECTOMY WITH AXILLARY SENTINEL NODE BIOPSY Right 11/25/2014   Procedure: RIGHT TOTAL  MASTECTOMY WITH AXILLARY SENTINEL NODE BIOPSY ;  Surgeon: Morene Olives, MD;  Location: [facility] OR;  Service: General;  Laterality: Right;   TOTAL KNEE ARTHROPLASTY Left 06/21/2020   Procedure: LEFT TOTAL KNEE ARTHROPLASTY;  Surgeon: Kathlynn Sharper, MD;  Location: [facility] ORS;  Service: Orthopedics;  Laterality: Left;   TOTAL KNEE ARTHROPLASTY Right 02/13/2022   Procedure: TOTAL KNEE ARTHROPLASTY;  Surgeon: Kathlynn Sharper, MD;  Location: [facility] ORS;  Service: Orthopedics;  Laterality: Right;   TUBAL LIGATION     VAGINAL DELIVERY  x3   VAGINAL HYSTERECTOMY  2006   fibroids     Home Medications   Prior to Admission medications   Medication Sig Start Date End Date Taking? Authorizing Provider  Calcium  Carb-Cholecalciferol  600-800 MG-UNIT TABS Take 2 tablets by mouth every evening.    [provider]  Continuous Glucose Receiver (DEXCOM G6 RECEIVER) DEVI Use as directed for diabetes 08/13/23   Tower, Laine LABOR, MD  Continuous Glucose Sensor (DEXCOM G6 SENSOR) MISC 1 each by Does not apply route as directed. 08/13/23   Tower, Laine LABOR, MD  Continuous Glucose Transmitter (DEXCOM G6 TRANSMITTER) MISC Use as directed for diabetes 08/13/23   Tower, Laine A, MD  Dulaglutide  (TRULICITY ) 4.5 MG/0.5ML SOAJ INJECT 4.5 MG AS DIRECTED ONCE A WEEK. 11/25/23 05/11/24  Tower, Laine LABOR, MD  ergocalciferol  (VITAMIN D2) 1.25 MG (50000 UT) capsule Take 1 capsule (50,000 Units total) by mouth once a week. 04/21/24   Tower, Laine LABOR, MD  gabapentin  (NEURONTIN ) 300 MG capsule TAKE 1 CAPSULE BY MOUTH EVERYDAY AT BEDTIME 06/24/23   Tower, [place] A, MD  glipiZIDE  (GLUCOTROL  XL) 10 MG 24 hr tablet TAKE  1 TABLET (10 MG TOTAL) BY MOUTH DAILY WITH BREAKFAST. 05/12/24   Tower, [place] A, MD  glucose blood (ONETOUCH VERIO) test strip USE TO CHECK BLOOD SUGAR ONCE DAILY AND AS NEEDED 11/02/21   Tower, Laine LABOR, MD  insulin  glargine (SEMGLEE ) 100 UNIT/ML injection Inject 0.75 mLs (75 Units total) into the skin daily. 01/23/24   Tower, Laine LABOR, MD  levothyroxine  (SYNTHROID ) 50 MCG tablet TAKE 1 TABLET BY MOUTH EVERY DAY BEFORE BREAKFAST 05/12/24   Tower, Laine LABOR, MD  losartan -hydrochlorothiazide  (HYZAAR) 100-25 MG tablet TAKE 1 TABLET BY MOUTH EVERY DAY 11/08/23   Tower, Laine LABOR, MD  metoprolol  succinate (TOPROL -XL) 50 MG 24 hr tablet TAKE 1 TABLET BY MOUTH EVERY DAY WITH OR IMMEDIATELY FOLLOWING A MEAL 11/08/23   Tower, Laine LABOR, MD  Multiple Vitamin (MULTIVITAMIN WITH MINERALS) TABS tablet Take 1 tablet by mouth every evening.    [provider]  omeprazole  (PRILOSEC ) 20 MG capsule TAKE 1 CAPSULE BY MOUTH DAILY BEFORE BREAKFAST 02/03/21   Tower, Laine LABOR, MD  [facility] DELICA LANCETS FINE MISC Use to check blood sugar once daily and as needed (dx. E11.9) 02/10/18   Tower, Laine LABOR, MD  potassium chloride  (KLOR-CON ) 10 MEQ tablet TAKE 1 TABLET BY MOUTH EVERY DAY 11/08/23   Tower, Laine LABOR, MD  Risankizumab-rzaa (SKYRIZI, 150 MG DOSE, [place]) Inject 150 mcg into the skin every 3 (three) months. Take as directed (every 3 month)    [provider]  rosuvastatin  (CRESTOR ) 5 MG tablet TAKE 1 TABLET (5 MG TOTAL) BY MOUTH DAILY. 11/08/23   Tower, Laine LABOR, MD  valACYclovir  (VALTREX ) 500 MG tablet TAKE 1 TABLET(500 MG) BY MOUTH TWICE DAILY FOR 5 DAYS FOR OUTBREAK 01/23/24   Tower, Laine LABOR, MD  venlafaxine  XR (EFFEXOR -XR) 75 MG 24 hr capsule TAKE 1 CAPSULE(75 MG) BY MOUTH DAILY WITH BREAKFAST 05/12/24   Tower, Laine LABOR, MD     Allergies  Caltrate 600+d plus minerals [calcium  600 + minerals], Metformin  and related, and Norvasc [amlodipine besylate]   Family History   Family History  Problem Relation Age of Onset    Alcohol abuse Father    COPD Father    Hypertension Father    Hypertension Mother    Parkinson's disease Mother      Physical Exam  Triage Vital Signs: ED Triage Vitals [06/14/24 0426]  Encounter Vitals Group     BP      Girls Systolic BP Percentile      Girls Diastolic BP Percentile      Boys Systolic BP Percentile      Boys Diastolic BP Percentile      Pulse      Resp      Temp      Temp src      SpO2      Weight 210 lb (95.3 kg)     Height 5' 4 (1.626 m)     Head Circumference      Peak Flow      Pain Score      Pain Loc      Pain Education      Exclude from Growth Chart     Updated Vital Signs: BP (!) 141/84   Pulse 76   Temp 97.7 F (36.5 C) (Oral)   Resp (!) 8   Ht 5' 4 (1.626 m)   Wt 95.3 kg   SpO2 95%   BMI 36.05 kg/m    General: Awake, mild distress.  CV:  RRR.  Good peripheral perfusion.  Resp:  Normal effort.  CTAB. Abd:  Mildly tender to epigastrium without rebound or guarding.  No distention.  Other:  No truncal vesicles.  Bilateral calves are supple without tenderness.   ED Results / Procedures / Treatments  Labs (all labs ordered are listed, but only abnormal results are displayed) Labs Reviewed  CBC WITH DIFFERENTIAL/PLATELET - Abnormal; Notable for the following components:      Result Value   Hemoglobin 15.9 (*)    All other components within normal limits  COMPREHENSIVE METABOLIC PANEL WITH GFR - Abnormal; Notable for the following components:   Potassium 3.1 (*)    Glucose, Bld 188 (*)    Creatinine, Ser 1.17 (*)    Calcium  8.8 (*)    GFR, Estimated 53 (*)    All other components within normal limits  LIPASE, BLOOD - Abnormal; Notable for the following components:  Lipase 1,818 (*)    All other components within normal limits  TROPONIN I (HIGH SENSITIVITY)  TROPONIN I (HIGH SENSITIVITY)     EKG  ED ECG REPORT I, Edina Winningham J, the attending physician, personally viewed and interpreted this ECG.   Date:  06/14/2024  EKG Time: 0429  Rate: 84  Rhythm: normal sinus rhythm  Axis: Normal  Intervals:none  ST&T Change: Nonspecific    RADIOLOGY I have independently visualized and interpreted patient's imaging studies as well as noted the radiology interpretation:  CT abd/pel: +pancreatitis  Chest x-ray: No acute cardiopulmonary process  Official radiology report(s): CT ABDOMEN PELVIS W CONTRAST Result Date: 06/14/2024 CLINICAL DATA:  Abdominal pain EXAM: CT ABDOMEN AND PELVIS WITH CONTRAST TECHNIQUE: Multidetector CT imaging of the abdomen and pelvis was performed using the standard protocol following bolus administration of intravenous contrast. RADIATION DOSE REDUCTION: This exam was performed according to the departmental dose-optimization program which includes automated exposure control, adjustment of the mA and/or kV according to patient size and/or use of iterative reconstruction technique. CONTRAST:  100mL OMNIPAQUE IOHEXOL 300 MG/ML  SOLN COMPARISON:  None Available. FINDINGS: Lower chest: No acute abnormality. Hepatobiliary: No focal liver abnormality is seen. Status post cholecystectomy. No biliary dilatation. Pancreas: The pancreas appears diffusely edematous with peripancreatic fat stranding. Trace amount of fluid extends from the tail of pancreas into the left retroperitoneum. No main duct dilatation, inflammation or suspicious mass. No signs of pancreatic necrosis or pseudocyst. Spleen: Normal in size without focal abnormality. Adrenals/Urinary Tract: Normal adrenal glands. Bilateral renal cortical scarring. No mass or obstructive uropathy. Upper pole left kidney stone measures 6 mm, image 36/2. Bladder is normal. Stomach/Bowel: Stomach is within normal limits. Appendix appears normal. No evidence of bowel wall thickening, distention, or inflammatory changes. Sigmoid diverticulosis without signs of diverticulitis. Vascular/Lymphatic: Aortic atherosclerosis. No enlarged abdominal or pelvic  lymph nodes. Reproductive: Status post hysterectomy. No adnexal masses. Other: No abdominal wall hernia or abnormality. No abdominopelvic ascites. Musculoskeletal: Lumbar degenerative disc disease is identified at L4-5 and L5-S1. No acute or suspicious osseous findings. Postoperative changes involving the right breast with implant in place. IMPRESSION: 1. The pancreas appears diffusely edematous with peripancreatic fat stranding. Trace amount of fluid extends from the tail of pancreas into the left retroperitoneum. Findings are compatible with acute pancreatitis. No signs of pancreatic necrosis or pseudocyst. 2. Nonobstructing left renal calculus. 3. Sigmoid diverticulosis without signs of diverticulitis. 4.  Aortic Atherosclerosis (ICD10-I70.0). Electronically Signed   By: Waddell Calk M.D.   On: 06/14/2024 06:27   DG Chest Port 1 View Result Date: 06/14/2024 CLINICAL DATA:  Central epigastric pain onset yesterday worsening today. EXAM: PORTABLE CHEST 1 VIEW COMPARISON:  PA Lat chest 04/21/2018 FINDINGS: The heart size and mediastinal contours are within normal limits. There is calcification in the transverse aorta. Both lungs are clear. The visualized skeletal structures are intact, with degenerative change of the spine. IMPRESSION: No evidence of acute chest disease. Aortic atherosclerosis. Stable chest. Electronically Signed   By: Francis Quam M.D.   On: 06/14/2024 05:16     PROCEDURES:  Critical Care performed: Yes, see critical care procedure note(s)  CRITICAL CARE Performed by: ROBINETTE VERMELL PARAS   Total critical care time: 45 minutes  Critical care time was exclusive of separately billable procedures and treating other patients.  Critical care was necessary to treat or prevent imminent or life-threatening deterioration.  Critical care was time spent personally by me on the following activities: development of treatment plan with patient and/or  surrogate as well as nursing, discussions with  consultants, evaluation of patient's response to treatment, examination of patient, obtaining history from patient or surrogate, ordering and performing treatments and interventions, ordering and review of laboratory studies, ordering and review of radiographic studies, pulse oximetry and re-evaluation of patient's condition.   SABRA1-3 Lead EKG Interpretation  Performed by: Robinette Vermell PARAS, MD Authorized by: Robinette Vermell PARAS, MD     Interpretation: normal     ECG rate:  85   ECG rate assessment: normal     Rhythm: sinus rhythm     Ectopy: none     Conduction: normal   Comments:     Patient placed on cardiac monitor to evaluate for arrhythmias    MEDICATIONS ORDERED IN ED: Medications  potassium chloride  10 mEq in 100 mL IVPB (has no administration in time range)  morphine  (PF) 4 MG/ML injection 4 mg (has no administration in time range)  ondansetron  (ZOFRAN ) injection 4 mg (has no administration in time range)  ondansetron  (ZOFRAN ) injection 4 mg (4 mg Intravenous Given 06/14/24 0449)  famotidine (PEPCID) IVPB 20 mg premix (0 mg Intravenous Stopped 06/14/24 0553)  fentaNYL  (SUBLIMAZE ) injection 50 mcg (50 mcg Intravenous Given 06/14/24 0450)  iohexol (OMNIPAQUE) 300 MG/ML solution 100 mL (100 mLs Intravenous Contrast Given 06/14/24 0607)     IMPRESSION / MDM / ASSESSMENT AND PLAN / ED COURSE  I reviewed the triage vital signs and the nursing notes.                             63 year old female presenting with epigastric/substernal chest pain. Differential diagnosis includes, but is not limited to, biliary disease (biliary colic, acute cholecystitis, cholangitis, choledocholithiasis, etc), intrathoracic causes for epigastric abdominal pain including ACS, gastritis, duodenitis, pancreatitis, small bowel or large bowel obstruction, abdominal aortic aneurysm, hernia, and ulcer(s).  I personally reviewed patient's records and note PCP office visit from 04/21/2024 for follow-up hypertension, diabetes,  HLD, hypothyroidism, osteopenia.  Patient's presentation is most consistent with acute complicated illness / injury requiring diagnostic workup.  The patient is on the cardiac monitor to evaluate for evidence of arrhythmia and/or significant heart rate changes.  Will obtain cardiac panel including LFTs/lipase, CT abdomen/pelvis.  Administer IV Fentanyl  for pain, IV Pepcid, Zofran  for nausea and reassess.  Clinical Course as of 06/14/24 9287  [place] Jun 14, 2024  9364 CT demonstrates acute pancreatitis.  LFTs unremarkable.  Awaiting results of lipase. [JS]  0700 Lipase greater than 1800.  Pain returning.  Will consult hospitalist services for evaluation and admission. [JS]    Clinical Course User Index [JS] Robinette Vermell PARAS, MD     FINAL CLINICAL IMPRESSION(S) / ED DIAGNOSES   Final diagnoses:  Chest pain, unspecified type  Epigastric pain  Acute pancreatitis without infection or necrosis, unspecified pancreatitis type  AKI (acute kidney injury) (HCC)  Hypokalemia     Rx / DC Orders   ED Discharge Orders     None        Note:  This document was prepared using Dragon voice recognition software and may include unintentional dictation errors.   Robinette Vermell PARAS, MD 06/14/24 [phone]

## 2024-06-14 NOTE — H&P (Signed)
 History and Physical    [name] FMW:[id number] DOB: [birth year] DOA: 06/14/2024  PCP: Randeen Laine LABOR, MD  Patient coming from: home  I have personally briefly reviewed patient's old medical records in [facility]  Chief Complaint: chest pain  HPI: Diana Deleon is a 63 y.o. female with medical history significant of  Anemia, Right BRCA,  DMII, GERD, HTN , hypothyroidism,HLD, classII obesity who presents to ED with complaint of chest pain/epigasgastric pain x 1 day that has been progressive. Patient notes pain is associated with nausea. Patient denies f/c/sob/diaphoresis/ previous episode like this in the past.  ED Course:  Vitals: afeb, bp 150/109, hr 87, rr 18 sat 100%  EKG: nsr Labs wbc 10.3, hgb 15.9, plt 236 Na 136, K 3.1,cr 1.17 at baseline,  Lipase 1818 Cxr: NAD CTAB IMPRESSION: 1. The pancreas appears diffusely edematous with peripancreatic fat stranding. Trace amount of fluid extends from the tail of pancreas into the left retroperitoneum. Findings are compatible with acute pancreatitis. No signs of pancreatic necrosis or pseudocyst. 2. Nonobstructing left renal calculus. 3. Sigmoid diverticulosis without signs of diverticulitis.  Tx pepcid,zofran ,morphine ,potasisum Review of Systems: As per HPI otherwise 10 point review of systems negative.   Past Medical History:  Diagnosis Date   Anemia    off and on   Arthritis    right toes (03/24/2015)   Cancer of right breast (HCC) 2015   Cyst of cystic duct    chest- central location, taking Cephalexin  currently   Diabetes mellitus without complication (HCC)    Fibroids    hyst. 2006   GERD (gastroesophageal reflux disease)    Headache    History of abnormal Pap smear    has had leep in past   History of cold sores    has used valtrex  in past   History of kidney stones    History of recurrent UTIs    Hypertension    Hypothyroidism    Intermittent vertigo    Seborrheic dermatitis    local on R scalp and  in ear (failed mult meds- uses steroid spray)   Thyroid  disease     Past Surgical History:  Procedure Laterality Date   BREAST RECONSTRUCTION WITH PLACEMENT OF TISSUE EXPANDER AND FLEX HD (ACELLULAR HYDRATED DERMIS) Right 11/25/2014   Procedure: RIGHT BREAST RECONSTRUCTION WITH PLACEMENT OF TISSUE EXPANDER AND USE OF ACELLULAR DERMRAL MATRIX ;  Surgeon: Alm Sick, MD;  Location: [facility] OR;  Service: Plastics;  Laterality: Right;   BREAST REDUCTION SURGERY Left 03/24/2015   Procedure: LEFT BREAST REDUCTION  (BREAST);  Surgeon: Alm Sick, MD;  Location: [facility] OR;  Service: Plastics;  Laterality: Left;   BREAST SURGERY Right    mastectomy  lymph node removal   COLONOSCOPY     COLONOSCOPY WITH PROPOFOL  N/A 12/28/2016   Procedure: COLONOSCOPY WITH PROPOFOL ;  Surgeon: Ruel Kung, MD;  Location: [facility] END[facility]COPY;  Service: Endoscopy;  Laterality: N/A;   LAPAROSCOPIC CHOLECYSTECTOMY  2003   LITHOTRIPSY  ~ 2007   REMOVAL OF TISSUE EXPANDER Right 03/24/2015   REMOVAL OF TISSUE EXPANDER AND PLACEMENT OF IMPLANT Right 03/24/2015   w/delayed construction   REMOVAL OF TISSUE EXPANDER AND PLACEMENT OF IMPLANT Right 03/24/2015   Procedure: REMOVAL OF RIGHT BREAST TISSUE EXPANDER AND DELAYED BREAST RECONSTRUCTION WITH PLACEMENT OF IMPLANT;  Surgeon: Alm Sick, MD;  Location: [facility] OR;  Service: Plastics;  Laterality: Right;   SIMPLE MASTECTOMY WITH AXILLARY SENTINEL NODE BIOPSY Right 11/25/2014   Procedure: RIGHT TOTAL  MASTECTOMY WITH AXILLARY  SENTINEL NODE BIOPSY ;  Surgeon: Morene Olives, MD;  Location: [facility] OR;  Service: General;  Laterality: Right;   TOTAL KNEE ARTHROPLASTY Left 06/21/2020   Procedure: LEFT TOTAL KNEE ARTHROPLASTY;  Surgeon: Kathlynn Sharper, MD;  Location: [facility] ORS;  Service: Orthopedics;  Laterality: Left;   TOTAL KNEE ARTHROPLASTY Right 02/13/2022   Procedure: TOTAL KNEE ARTHROPLASTY;  Surgeon: Kathlynn Sharper, MD;  Location: [facility] ORS;  Service: Orthopedics;  Laterality: Right;   TUBAL LIGATION      VAGINAL DELIVERY  x3   VAGINAL HYSTERECTOMY  2006   fibroids     reports that she has never smoked. She has never used smokeless tobacco. She reports that she does not drink alcohol and does not use drugs.  Allergies  Allergen Reactions   Caltrate 600+D Plus Minerals [Calcium  600 + Minerals]     Nausea    Metformin  And Related Diarrhea   Norvasc [Amlodipine Besylate] Swelling    Family History  Problem Relation Age of Onset   Alcohol abuse Father    COPD Father    Hypertension Father    Hypertension Mother    Parkinson's disease Mother     Prior to Admission medications   Medication Sig Start Date End Date Taking? Authorizing Provider  Calcium  Carb-Cholecalciferol  600-800 MG-UNIT TABS Take 2 tablets by mouth every evening.   Yes [provider]  Dulaglutide  (TRULICITY ) 4.5 MG/0.5ML SOAJ INJECT 4.5 MG AS DIRECTED ONCE A WEEK. 11/25/23 06/14/24 Yes Tower, Laine LABOR, MD  ergocalciferol  (VITAMIN D2) 1.25 MG (50000 UT) capsule Take 1 capsule (50,000 Units total) by mouth once a week. 04/21/24  Yes Tower, Laine LABOR, MD  gabapentin  (NEURONTIN ) 300 MG capsule TAKE 1 CAPSULE BY MOUTH EVERYDAY AT BEDTIME 06/24/23  Yes Tower, Laine LABOR, MD  glipiZIDE  (GLUCOTROL  XL) 10 MG 24 hr tablet TAKE 1 TABLET (10 MG TOTAL) BY MOUTH DAILY WITH BREAKFAST. 05/12/24  Yes Tower, Laine LABOR, MD  insulin  glargine (SEMGLEE ) 100 UNIT/ML injection Inject 0.75 mLs (75 Units total) into the skin daily. 01/23/24  Yes Tower, Laine LABOR, MD  levothyroxine  (SYNTHROID ) 50 MCG tablet TAKE 1 TABLET BY MOUTH EVERY DAY BEFORE BREAKFAST 05/12/24  Yes Tower, Laine LABOR, MD  losartan -hydrochlorothiazide  (HYZAAR) 100-25 MG tablet TAKE 1 TABLET BY MOUTH EVERY DAY 11/08/23  Yes Tower, Laine LABOR, MD  metoprolol  succinate (TOPROL -XL) 50 MG 24 hr tablet TAKE 1 TABLET BY MOUTH EVERY DAY WITH OR IMMEDIATELY FOLLOWING A MEAL 11/08/23  Yes Tower, Marne A, MD  omeprazole  (PRILOSEC ) 20 MG capsule TAKE 1 CAPSULE BY MOUTH DAILY BEFORE BREAKFAST 02/03/21   Yes Tower, Laine LABOR, MD  potassium chloride  (KLOR-CON ) 10 MEQ tablet TAKE 1 TABLET BY MOUTH EVERY DAY 11/08/23  Yes Tower, Marne A, MD  Risankizumab-rzaa (SKYRIZI, 150 MG DOSE, Gross) Inject 150 mcg into the skin every 3 (three) months. Take as directed (every 3 month)   Yes [provider]  rosuvastatin  (CRESTOR ) 5 MG tablet TAKE 1 TABLET (5 MG TOTAL) BY MOUTH DAILY. 11/08/23  Yes Tower, Laine LABOR, MD  valACYclovir  (VALTREX ) 500 MG tablet TAKE 1 TABLET(500 MG) BY MOUTH TWICE DAILY FOR 5 DAYS FOR OUTBREAK 01/23/24  Yes Tower, Laine LABOR, MD  venlafaxine  XR (EFFEXOR -XR) 75 MG 24 hr capsule TAKE 1 CAPSULE(75 MG) BY MOUTH DAILY WITH BREAKFAST 05/12/24  Yes Tower, Laine LABOR, MD  Continuous Glucose Receiver (DEXCOM G6 RECEIVER) DEVI Use as directed for diabetes 08/13/23   Tower, Laine LABOR, MD  Continuous Glucose Sensor (DEXCOM G6 SENSOR) MISC 1  each by Does not apply route as directed. 08/13/23   Tower, Laine LABOR, MD  Continuous Glucose Transmitter (DEXCOM G6 TRANSMITTER) MISC Use as directed for diabetes 08/13/23   Tower, Laine A, MD  glucose blood (ONETOUCH VERIO) test strip USE TO CHECK BLOOD SUGAR ONCE DAILY AND AS NEEDED 11/02/21   Tower, Laine LABOR, MD  Multiple Vitamin (MULTIVITAMIN WITH MINERALS) TABS tablet Take 1 tablet by mouth every evening. Patient not taking: Reported on 06/14/2024    [provider]  [facility] DELICA LANCETS FINE MISC Use to check blood sugar once daily and as needed (dx. E11.9) 02/10/18   Randeen Laine LABOR, MD    Physical Exam: Vitals:   06/14/24 0700 06/14/24 0715 06/14/24 0725 06/14/24 0745  BP:   135/86   Pulse: 75 74 74   Resp: 15 14  13   Temp:   98.1 F (36.7 C)   TempSrc:   Oral   SpO2: 95% 98% 98%   Weight:      Height:        Constitutional: NAD, calm, comfortable Vitals:   06/14/24 0700 06/14/24 0715 06/14/24 0725 06/14/24 0745  BP:   135/86   Pulse: 75 74 74   Resp: 15 14  13   Temp:   98.1 F (36.7 C)   TempSrc:   Oral   SpO2: 95% 98% 98%   Weight:       Height:       Eyes: PERRL, lids and conjunctivae normal ENMT: Mucous membranes are moist. Posterior pharynx clear of any exudate or lesions.Normal dentition.  Neck: normal, supple, no masses, no thyromegaly Respiratory: clear to auscultation bilaterally, no wheezing, no crackles. Normal respiratory effort. No accessory muscle use.  Cardiovascular: Regular rate and rhythm, no murmurs / rubs / gallops. No extremity edema. 2+ pedal pulses. No carotid bruits.  Abdomen: +epigastric tenderness, no masses palpated. No hepatosplenomegaly. Bowel sounds positive.  Musculoskeletal: no clubbing / cyanosis. No joint deformity upper and lower extremities. Good ROM, no contractures. Normal muscle tone.  Skin: no rashes, lesions, ulcers. No induration Neurologic: CN 2-12 grossly intact. Sensation intact, . Strength 5/5 in all 4.  Psychiatric: Normal judgment and insight. Alert and oriented x 3. Normal mood.    Labs on Admission: I have personally reviewed following labs and imaging studies  CBC: Recent Labs  Lab 06/14/24 0439  WBC 10.3  NEUTROABS 7.7  HGB 15.9*  HCT 45.5  MCV 89.9  PLT 236   Basic Metabolic Panel: Recent Labs  Lab 06/14/24 0439  NA 136  K 3.1*  CL 102  CO2 27  GLUCOSE 188*  BUN 13  CREATININE 1.17*  CALCIUM  8.8*   GFR: Estimated Creatinine Clearance: 56.5 mL/min (A) (by C-G formula based on SCr of 1.17 mg/dL (H)). Liver Function Tests: Recent Labs  Lab 06/14/24 0439  AST 19  ALT 16  ALKPHOS 91  BILITOT 1.1  PROT 7.5  ALBUMIN  3.8   Recent Labs  Lab 06/14/24 0439  LIPASE 1,818*   No results for input(s): AMMONIA in the last 168 hours. Coagulation Profile: No results for input(s): INR, PROTIME in the last 168 hours. Cardiac Enzymes: No results for input(s): CKTOTAL, CKMB, CKMBINDEX, TROPONINI in the last 168 hours. BNP (last 3 results) No results for input(s): PROBNP in the last 8760 hours. HbA1C: No results for input(s): HGBA1C in  the last 72 hours. CBG: No results for input(s): GLUCAP in the last 168 hours. Lipid Profile: No results for input(s): CHOL, HDL, LDLCALC,  TRIG, CHOLHDL, LDLDIRECT in the last 72 hours. Thyroid  Function Tests: No results for input(s): TSH, T4TOTAL, FREET4, T3FREE, THYROIDAB in the last 72 hours. Anemia Panel: No results for input(s): VITAMINB12, FOLATE, FERRITIN, TIBC, IRON, RETICCTPCT in the last 72 hours. Urine analysis:    Component Value Date/Time   COLORURINE YELLOW (A) 01/30/2022 0941   APPEARANCEUR HAZY (A) 01/30/2022 0941   LABSPEC 1.024 01/30/2022 0941   PHURINE 5.0 01/30/2022 0941   GLUCOSEU NEGATIVE 01/30/2022 0941   HGBUR NEGATIVE 01/30/2022 0941   HGBUR trace-intact 07/15/2009 1430   BILIRUBINUR Negative 04/18/2023 0943   KETONESUR NEGATIVE 01/30/2022 0941   PROTEINUR Positive (A) 04/18/2023 0943   PROTEINUR NEGATIVE 01/30/2022 0941   UROBILINOGEN 0.2 04/18/2023 0943   UROBILINOGEN 0.2 07/15/2009 1430   NITRITE Negative 04/18/2023 0943   NITRITE NEGATIVE 01/30/2022 0941   LEUKOCYTESUR Negative 04/18/2023 0943   LEUKOCYTESUR NEGATIVE 01/30/2022 0941    Radiological Exams on Admission: CT ABDOMEN PELVIS W CONTRAST Result Date: 06/14/2024 CLINICAL DATA:  Abdominal pain EXAM: CT ABDOMEN AND PELVIS WITH CONTRAST TECHNIQUE: Multidetector CT imaging of the abdomen and pelvis was performed using the standard protocol following bolus administration of intravenous contrast. RADIATION DOSE REDUCTION: This exam was performed according to the departmental dose-optimization program which includes automated exposure control, adjustment of the mA and/or kV according to patient size and/or use of iterative reconstruction technique. CONTRAST:  100mL OMNIPAQUE IOHEXOL 300 MG/ML  SOLN COMPARISON:  None Available. FINDINGS: Lower chest: No acute abnormality. Hepatobiliary: No focal liver abnormality is seen. Status post cholecystectomy. No biliary  dilatation. Pancreas: The pancreas appears diffusely edematous with peripancreatic fat stranding. Trace amount of fluid extends from the tail of pancreas into the left retroperitoneum. No main duct dilatation, inflammation or suspicious mass. No signs of pancreatic necrosis or pseudocyst. Spleen: Normal in size without focal abnormality. Adrenals/Urinary Tract: Normal adrenal glands. Bilateral renal cortical scarring. No mass or obstructive uropathy. Upper pole left kidney stone measures 6 mm, image 36/2. Bladder is normal. Stomach/Bowel: Stomach is within normal limits. Appendix appears normal. No evidence of bowel wall thickening, distention, or inflammatory changes. Sigmoid diverticulosis without signs of diverticulitis. Vascular/Lymphatic: Aortic atherosclerosis. No enlarged abdominal or pelvic lymph nodes. Reproductive: Status post hysterectomy. No adnexal masses. Other: No abdominal wall hernia or abnormality. No abdominopelvic ascites. Musculoskeletal: Lumbar degenerative disc disease is identified at L4-5 and L5-S1. No acute or suspicious osseous findings. Postoperative changes involving the right breast with implant in place. IMPRESSION: 1. The pancreas appears diffusely edematous with peripancreatic fat stranding. Trace amount of fluid extends from the tail of pancreas into the left retroperitoneum. Findings are compatible with acute pancreatitis. No signs of pancreatic necrosis or pseudocyst. 2. Nonobstructing left renal calculus. 3. Sigmoid diverticulosis without signs of diverticulitis. 4.  Aortic Atherosclerosis (ICD10-I70.0). Electronically Signed   By: Waddell Calk M.D.   On: 06/14/2024 06:27   DG Chest Port 1 View Result Date: 06/14/2024 CLINICAL DATA:  Central epigastric pain onset yesterday worsening today. EXAM: PORTABLE CHEST 1 VIEW COMPARISON:  PA Lat chest 04/21/2018 FINDINGS: The heart size and mediastinal contours are within normal limits. There is calcification in the transverse aorta.  Both lungs are clear. The visualized skeletal structures are intact, with degenerative change of the spine. IMPRESSION: No evidence of acute chest disease. Aortic atherosclerosis. Stable chest. Electronically Signed   By: Francis Quam M.D.   On: 06/14/2024 05:16    EKG: Independently reviewed.   Assessment/Plan   Acute pancreatitis  -admit to med tele  -  placed on ivfs  - clear liquids as tolerated  - ct scan w/o obstruction , patient s/p cholecystectomy  - unclear cause , no hx of etoh  -will check lipid panel   - further studies such as MRCP if cause not found for possible retained stone  -medications reviewed  possible  culprit Skyrizi  IL inhibitor vs trulicity  GLP1 Holding these medications  Anemia -stable   RBRCA  s/p mastectomy -s/p treatment in remission   GERD -ppi   DMII -iss fs  -resume lantus    HTN  -resume home regimen metoprolol  holding hyzaar in setting of decrease po intake  Hypothyroidism  -continue synthroid    HLD  -continue statin   Class II obesity BMI 36.05      DVT prophylaxis: heparin  Code Status: full/ as discussed per patient wishes in event of cardiac arrest  Family Communication: none at bedside Disposition Plan: full/ as discussed per patient wishes in event of cardiac arrest  Consults called: none Admission status: progressive care    Camila DELENA Ned MD [facility]   If 7PM-7AM, please contact night-coverage [email or web address] Password [email or web address]  06/14/2024, 9:50 AM

## 2024-06-14 NOTE — ED Triage Notes (Signed)
 Pt arrived via POV with reports of central epigastric pain that started yesterday worse today, pt had headache yesterday which is gone, c/o nausea as well.  Denies shortness of breath.  Pt reports she does not have gallbladder removed many years ago.

## 2024-06-15 DIAGNOSIS — E66812 Obesity, class 2: Secondary | ICD-10-CM

## 2024-06-15 DIAGNOSIS — R935 Abnormal findings on diagnostic imaging of other abdominal regions, including retroperitoneum: Secondary | ICD-10-CM | POA: Diagnosis not present

## 2024-06-15 DIAGNOSIS — D649 Anemia, unspecified: Secondary | ICD-10-CM | POA: Diagnosis not present

## 2024-06-15 DIAGNOSIS — K859 Acute pancreatitis without necrosis or infection, unspecified: Secondary | ICD-10-CM | POA: Diagnosis not present

## 2024-06-15 DIAGNOSIS — E039 Hypothyroidism, unspecified: Secondary | ICD-10-CM

## 2024-06-15 DIAGNOSIS — I10 Essential (primary) hypertension: Secondary | ICD-10-CM

## 2024-06-15 DIAGNOSIS — E119 Type 2 diabetes mellitus without complications: Secondary | ICD-10-CM | POA: Diagnosis not present

## 2024-06-15 DIAGNOSIS — E785 Hyperlipidemia, unspecified: Secondary | ICD-10-CM

## 2024-06-15 LAB — COMPREHENSIVE METABOLIC PANEL WITH GFR
ALT: 17 U/L (ref 0–44)
AST: 22 U/L (ref 15–41)
Albumin: 3.3 g/dL — ABNORMAL LOW (ref 3.5–5.0)
Alkaline Phosphatase: 71 U/L (ref 38–126)
Anion gap: 9 (ref 5–15)
BUN: 10 mg/dL (ref 8–23)
CO2: 27 mmol/L (ref 22–32)
Calcium: 8.2 mg/dL — ABNORMAL LOW (ref 8.9–10.3)
Chloride: 103 mmol/L (ref 98–111)
Creatinine, Ser: 0.93 mg/dL (ref 0.44–1.00)
GFR, Estimated: >60 mL/min (ref >60)
Glucose, Bld: 97 mg/dL (ref 70–99)
Potassium: 3.5 mmol/L (ref 3.5–5.1)
Sodium: 139 mmol/L (ref 135–145)
Total Bilirubin: 1.5 mg/dL — ABNORMAL HIGH (ref 0.0–1.2)
Total Protein: 6.9 g/dL (ref 6.5–8.1)

## 2024-06-15 LAB — CBC
HCT: 43.9 % (ref 36.0–46.0)
Hemoglobin: 14.7 g/dL (ref 12.0–15.0)
MCH: 30.9 pg (ref 26.0–34.0)
MCHC: 33.5 g/dL (ref 30.0–36.0)
MCV: 92.4 fL (ref 80.0–100.0)
Platelets: 214 10*3/uL (ref 150–400)
RBC: 4.75 MIL/uL (ref 3.87–5.11)
RDW: 12.8 % (ref 11.5–15.5)
WBC: 9.5 10*3/uL (ref 4.0–10.5)
nRBC: 0 % (ref 0.0–0.2)

## 2024-06-15 LAB — GLUCOSE, CAPILLARY
Glucose-Capillary: 135 mg/dL — ABNORMAL HIGH (ref 70–99)
Glucose-Capillary: 104 mg/dL — ABNORMAL HIGH (ref 70–99)
Glucose-Capillary: 183 mg/dL — ABNORMAL HIGH (ref 70–99)
Glucose-Capillary: 109 mg/dL — ABNORMAL HIGH (ref 70–99)
Glucose-Capillary: 140 mg/dL — ABNORMAL HIGH (ref 70–99)

## 2024-06-15 LAB — HIV ANTIBODY (ROUTINE TESTING W REFLEX): HIV Screen 4th Generation wRfx: NONREACTIVE

## 2024-06-15 MED ORDER — LACTATED RINGERS IV SOLN: INTRAVENOUS | Status: AC

## 2024-06-15 MED ORDER — LOSARTAN POTASSIUM 50 MG PO TABS
100.0000 mg | ORAL_TABLET | Freq: Every day | ORAL | Status: DC
  Administered 2024-06-15 – 2024-06-16 (×2): 100 mg via ORAL
  Filled 2024-06-15 (×2): qty 2

## 2024-06-15 MED ORDER — HYDROCHLOROTHIAZIDE 25 MG PO TABS
25.0000 mg | ORAL_TABLET | Freq: Every day | ORAL | Status: DC
  Administered 2024-06-15 – 2024-06-16 (×2): 25 mg via ORAL
  Filled 2024-06-15 (×2): qty 1

## 2024-06-15 MED ORDER — LOSARTAN POTASSIUM-HCTZ 100-25 MG PO TABS: 1.0000 | ORAL_TABLET | Freq: Every day | ORAL | Status: DC

## 2024-06-15 MED ORDER — POLYETHYLENE GLYCOL 3350 17 G PO PACK
17.0000 g | PACK | Freq: Every day | ORAL | Status: DC
  Administered 2024-06-15: 17 g via ORAL
  Filled 2024-06-15 (×2): qty 1

## 2024-06-15 NOTE — Assessment & Plan Note (Signed)
 Clinically seems improving.  GI was consulted due to concerning MRCP, no underlying risk for pancreatitis at this time.  Patient does not use alcohol, lipid panel normal, no new medications or supplement.  No prior incidence.  Liver enzymes normal except mildly elevated T. Bili. Nausea vomiting has been improved. - Advance diet to full liquid -GI is recommending repeating MRCP in 3 to 4 weeks and then reassess -Continue supportive care -Will advance diet as tolerated

## 2024-06-15 NOTE — Assessment & Plan Note (Signed)
 Hemoglobin stable.  Continue to monitor

## 2024-06-15 NOTE — Consult Note (Signed)
 Rogelia Copping, MD [facility]  [address] [place], [place] [address] Phone: [phone] Fax : [phone]  Consultation  Referring Provider:     Dr. Caleen Primary Care Physician:  Tower, Laine LABOR, MD Primary Gastroenterologist:  Dr. Therisa         Reason for Consultation:     Abnormal MRI  Date of Admission:  06/14/2024 Date of Consultation:  06/15/2024         HPI:   Diana Deleon is a 63 y.o. female who was admitted with uncomplicated pancreatitis with a lipase of 1802 days ago with no repeat lipase checked today.  The bilirubin is slightly elevated but the rest of the liver enzymes are normal.  The patient is status post cholecystectomy many years ago.  The patient had a MRCP done that showed:  IMPRESSION: 1. Mild, diffuse inflammatory fat stranding and fluid about the pancreas and adjacent retroperitoneum, consistent with acute pancreatitis. No evidence of pancreatic parenchymal necrosis or acute pancreatic fluid collection. 2. The pancreatic duct is focally effaced along a short segment within the central pancreatic head, reconstituted near the ampulla. Subtle, focal hypoenhancement in this vicinity without definitive evidence of mass, and this may reflect focal edema in the setting of pancreatitis, however this must be regarded with substantial suspicion for underlying mass. Consider ERCP/EUS to further assess the pancreatic head as well as short interval follow-up imaging in 1-2 months following resolution of acute clinical presentation. 3. Status post cholecystectomy.  No biliary ductal dilatation. 4. Mild hepatic steatosis. 5. Status post right mastectomy.  Due to the finding of consider ERCP/EUS for further assessment a GI consult was called.  The patient states that she tolerated lunch and is not having any pain in the epi gastric area as she was before but has some lower abdominal pain that she thinks feels like musculoskeletal pain.  The patient is not having any  nausea or vomiting.  She denies starting on any new medications or taking any over-the-counter supplements.  She also denies any alcohol use or abuse. The patient's last colonoscopy was in 2018 by Dr. Therisa.  Past Medical History:  Diagnosis Date   Anemia    off and on   Arthritis    right toes (03/24/2015)   Cancer of right breast (HCC) 2015   Cyst of cystic duct    chest- central location, taking Cephalexin  currently   Diabetes mellitus without complication (HCC)    Fibroids    hyst. 2006   GERD (gastroesophageal reflux disease)    Headache    History of abnormal Pap smear    has had leep in past   History of cold sores    has used valtrex  in past   History of kidney stones    History of recurrent UTIs    Hypertension    Hypothyroidism    Intermittent vertigo    Seborrheic dermatitis    local on R scalp and in ear (failed mult meds- uses steroid spray)   Thyroid  disease     Past Surgical History:  Procedure Laterality Date   BREAST RECONSTRUCTION WITH PLACEMENT OF TISSUE EXPANDER AND FLEX HD (ACELLULAR HYDRATED DERMIS) Right 11/25/2014   Procedure: RIGHT BREAST RECONSTRUCTION WITH PLACEMENT OF TISSUE EXPANDER AND USE OF ACELLULAR DERMRAL MATRIX ;  Surgeon: Alm Sick, MD;  Location: [facility] OR;  Service: Plastics;  Laterality: Right;   BREAST REDUCTION SURGERY Left 03/24/2015   Procedure: LEFT BREAST REDUCTION  (BREAST);  Surgeon: Alm Sick, MD;  Location: [facility] OR;  Service: Plastics;  Laterality: Left;   BREAST SURGERY Right    mastectomy  lymph node removal   COLONOSCOPY     COLONOSCOPY WITH PROPOFOL  N/A 12/28/2016   Procedure: COLONOSCOPY WITH PROPOFOL ;  Surgeon: Ruel Kung, MD;  Location: [facility] END[facility];  Service: Endoscopy;  Laterality: N/A;   LAPAROSCOPIC CHOLECYSTECTOMY  2003   LITHOTRIPSY  ~ 2007   REMOVAL OF TISSUE EXPANDER Right 03/24/2015   REMOVAL OF TISSUE EXPANDER AND PLACEMENT OF IMPLANT Right 03/24/2015   w/delayed construction   REMOVAL OF TISSUE EXPANDER AND  PLACEMENT OF IMPLANT Right 03/24/2015   Procedure: REMOVAL OF RIGHT BREAST TISSUE EXPANDER AND DELAYED BREAST RECONSTRUCTION WITH PLACEMENT OF IMPLANT;  Surgeon: Alm Sick, MD;  Location: [facility] OR;  Service: Plastics;  Laterality: Right;   SIMPLE MASTECTOMY WITH AXILLARY SENTINEL NODE BIOPSY Right 11/25/2014   Procedure: RIGHT TOTAL  MASTECTOMY WITH AXILLARY SENTINEL NODE BIOPSY ;  Surgeon: Morene Olives, MD;  Location: [facility] OR;  Service: General;  Laterality: Right;   TOTAL KNEE ARTHROPLASTY Left 06/21/2020   Procedure: LEFT TOTAL KNEE ARTHROPLASTY;  Surgeon: Kathlynn Sharper, MD;  Location: [facility] ORS;  Service: Orthopedics;  Laterality: Left;   TOTAL KNEE ARTHROPLASTY Right 02/13/2022   Procedure: TOTAL KNEE ARTHROPLASTY;  Surgeon: Kathlynn Sharper, MD;  Location: [facility] ORS;  Service: Orthopedics;  Laterality: Right;   TUBAL LIGATION     VAGINAL DELIVERY  x3   VAGINAL HYSTERECTOMY  2006   fibroids    Prior to Admission medications   Medication Sig Start Date End Date Taking? Authorizing Provider  Calcium  Carb-Cholecalciferol  600-800 MG-UNIT TABS Take 2 tablets by mouth every evening.   Yes [provider]  Dulaglutide  (TRULICITY ) 4.5 MG/0.5ML SOAJ INJECT 4.5 MG AS DIRECTED ONCE A WEEK. 11/25/23 06/14/24 Yes Tower, Laine LABOR, MD  ergocalciferol  (VITAMIN D2) 1.25 MG (50000 UT) capsule Take 1 capsule (50,000 Units total) by mouth once a week. 04/21/24  Yes Tower, Laine LABOR, MD  gabapentin  (NEURONTIN ) 300 MG capsule TAKE 1 CAPSULE BY MOUTH EVERYDAY AT BEDTIME 06/24/23  Yes Tower, Laine LABOR, MD  glipiZIDE  (GLUCOTROL  XL) 10 MG 24 hr tablet TAKE 1 TABLET (10 MG TOTAL) BY MOUTH DAILY WITH BREAKFAST. 05/12/24  Yes Tower, Laine LABOR, MD  insulin  glargine (SEMGLEE ) 100 UNIT/ML injection Inject 0.75 mLs (75 Units total) into the skin daily. 01/23/24  Yes Tower, Laine LABOR, MD  levothyroxine  (SYNTHROID ) 50 MCG tablet TAKE 1 TABLET BY MOUTH EVERY DAY BEFORE BREAKFAST 05/12/24  Yes Tower, Laine LABOR, MD   losartan -hydrochlorothiazide  (HYZAAR) 100-25 MG tablet TAKE 1 TABLET BY MOUTH EVERY DAY 11/08/23  Yes Tower, Laine LABOR, MD  metoprolol  succinate (TOPROL -XL) 50 MG 24 hr tablet TAKE 1 TABLET BY MOUTH EVERY DAY WITH OR IMMEDIATELY FOLLOWING A MEAL 11/08/23  Yes Tower, Marne A, MD  omeprazole  (PRILOSEC ) 20 MG capsule TAKE 1 CAPSULE BY MOUTH DAILY BEFORE BREAKFAST 02/03/21  Yes Tower, Laine LABOR, MD  potassium chloride  (KLOR-CON ) 10 MEQ tablet TAKE 1 TABLET BY MOUTH EVERY DAY 11/08/23  Yes Tower, Marne A, MD  Risankizumab-rzaa (SKYRIZI, 150 MG DOSE, [place]) Inject 150 mcg into the skin every 3 (three) months. Take as directed (every 3 month)   Yes [provider]  rosuvastatin  (CRESTOR ) 5 MG tablet TAKE 1 TABLET (5 MG TOTAL) BY MOUTH DAILY. 11/08/23  Yes Tower, Laine LABOR, MD  valACYclovir  (VALTREX ) 500 MG tablet TAKE 1 TABLET(500 MG) BY MOUTH TWICE DAILY FOR 5 DAYS FOR OUTBREAK 01/23/24  Yes Tower, Lynndyl A,  MD  venlafaxine  XR (EFFEXOR -XR) 75 MG 24 hr capsule TAKE 1 CAPSULE(75 MG) BY MOUTH DAILY WITH BREAKFAST 05/12/24  Yes Tower, Laine LABOR, MD  Continuous Glucose Receiver (DEXCOM G6 RECEIVER) DEVI Use as directed for diabetes 08/13/23   Tower, Laine LABOR, MD  Continuous Glucose Sensor (DEXCOM G6 SENSOR) MISC 1 each by Does not apply route as directed. 08/13/23   Tower, Laine LABOR, MD  Continuous Glucose Transmitter (DEXCOM G6 TRANSMITTER) MISC Use as directed for diabetes 08/13/23   Tower, Laine A, MD  glucose blood (ONETOUCH VERIO) test strip USE TO CHECK BLOOD SUGAR ONCE DAILY AND AS NEEDED 11/02/21   Tower, Laine LABOR, MD  Multiple Vitamin (MULTIVITAMIN WITH MINERALS) TABS tablet Take 1 tablet by mouth every evening. Patient not taking: Reported on 06/14/2024    [provider]  [facility] DELICA LANCETS FINE MISC Use to check blood sugar once daily and as needed (dx. E11.9) 02/10/18   Tower, Laine LABOR, MD    Family History  Problem Relation Age of Onset   Alcohol abuse Father    COPD Father    Hypertension  Father    Hypertension Mother    Parkinson's disease Mother      Social History   Tobacco Use   Smoking status: Never   Smokeless tobacco: Never  Vaping Use   Vaping status: Never Used  Substance Use Topics   Alcohol use: No    Alcohol/week: 0.0 standard drinks of alcohol   Drug use: No    Allergies as of 06/14/2024 - Review Complete 06/14/2024  Allergen Reaction Noted   Caltrate 600+d plus minerals [calcium  600 + minerals]  04/21/2024   Metformin  and related Diarrhea 10/31/2018   Norvasc [amlodipine besylate] Swelling 06/01/2011    Review of Systems:    All systems reviewed and negative except where noted in HPI.   Physical Exam:  Vital signs in last 24 hours: Temp:  [97.6 F (36.4 C)-99.6 F (37.6 C)] 99.6 F (37.6 C) (06/30 0816) Pulse Rate:  [79-95] 95 (06/30 0816) Resp:  [16-20] 16 (06/30 0816) BP: (134-142)/(77-88) 141/80 (06/30 0816) SpO2:  [95 %-100 %] 95 % (06/30 0816) Last BM Date : 06/12/24 General:   Pleasant, cooperative in NAD Head:  Normocephalic and atraumatic. Eyes:   No icterus.   Conjunctiva pink. PERRLA. Ears:  Normal auditory acuity. Abdomen:  Soft, nondistended, nontender. Normal bowel sounds. No appreciable masses or hepatomegaly.  No rebound or guarding.  Rectal:  Not performed. Msk:  Symmetrical without gross deformities.   Psych:  Alert and cooperative. Normal affect.  LAB RESULTS: Recent Labs    06/14/24 0439 06/15/24 0518  WBC 10.3 9.5  HGB 15.9* 14.7  HCT 45.5 43.9  PLT 236 214   BMET Recent Labs    06/14/24 0439 06/15/24 0518  NA 136 139  K 3.1* 3.5  CL 102 103  CO2 27 27  GLUCOSE 188* 97  BUN 13 10  CREATININE 1.17* 0.93  CALCIUM  8.8* 8.2*   LFT Recent Labs    06/15/24 0518  PROT 6.9  ALBUMIN  3.3*  AST 22  ALT 17  ALKPHOS 71  BILITOT 1.5*   PT/INR No results for input(s): LABPROT, INR in the last 72 hours.  STUDIES: MR ABDOMEN MRCP W WO CONTAST Result Date: 06/14/2024 CLINICAL DATA:  Acute  pancreatitis EXAM: MRI ABDOMEN WITHOUT AND WITH CONTRAST (INCLUDING MRCP) TECHNIQUE: Multiplanar multisequence MR imaging of the abdomen was performed both before and after the administration of intravenous contrast. Heavily  T2-weighted images of the biliary and pancreatic ducts were obtained, and three-dimensional MRCP images were rendered by post processing. CONTRAST:  9mL GADAVIST GADOBUTROL 1 MMOL/ML IV SOLN COMPARISON:  CT abdomen pelvis, 06/14/2024, 6:11 a.m. FINDINGS: Lower chest: No acute abnormality.  Status post right mastectomy. Hepatobiliary: No solid liver abnormality is seen. Mild hepatic steatosis. Status post cholecystectomy. No biliary ductal dilatation, common bile duct at the upper limit of normal in size measuring up to 0.7 cm. Pancreas: Mild, diffuse inflammatory fat stranding and fluid about the pancreas and adjacent retroperitoneum (series 9, image 19). No evidence of pancreatic parenchymal necrosis or acute pancreatic fluid collection. The pancreatic duct is focally effaced along a short segment within the central pancreatic head, reconstituted near the ampulla (series 3, image 19). Subtle, focal hypoenhancement in this vicinity without definitive evidence of mass (series 26, image 59). No pancreatic ductal dilatation. Spleen: Normal in size without significant abnormality. Adrenals/Urinary Tract: Adrenal glands are unremarkable. Kidneys are normal, without renal calculi, solid lesion, or hydronephrosis. Stomach/Bowel: Stomach is within normal limits. No evidence of bowel wall thickening, distention, or inflammatory changes. Vascular/Lymphatic: No significant vascular findings are present. No enlarged abdominal lymph nodes. Other: No abdominal wall hernia or abnormality. No ascites. Musculoskeletal: No acute or significant osseous findings. IMPRESSION: 1. Mild, diffuse inflammatory fat stranding and fluid about the pancreas and adjacent retroperitoneum, consistent with acute pancreatitis. No  evidence of pancreatic parenchymal necrosis or acute pancreatic fluid collection. 2. The pancreatic duct is focally effaced along a short segment within the central pancreatic head, reconstituted near the ampulla. Subtle, focal hypoenhancement in this vicinity without definitive evidence of mass, and this may reflect focal edema in the setting of pancreatitis, however this must be regarded with substantial suspicion for underlying mass. Consider ERCP/EUS to further assess the pancreatic head as well as short interval follow-up imaging in 1-2 months following resolution of acute clinical presentation. 3. Status post cholecystectomy.  No biliary ductal dilatation. 4. Mild hepatic steatosis. 5. Status post right mastectomy. Electronically Signed   By: Marolyn JONETTA Jaksch M.D.   On: 06/14/2024 20:57   MR 3D Recon At Scanner Result Date: 06/14/2024 CLINICAL DATA:  Acute pancreatitis EXAM: MRI ABDOMEN WITHOUT AND WITH CONTRAST (INCLUDING MRCP) TECHNIQUE: Multiplanar multisequence MR imaging of the abdomen was performed both before and after the administration of intravenous contrast. Heavily T2-weighted images of the biliary and pancreatic ducts were obtained, and three-dimensional MRCP images were rendered by post processing. CONTRAST:  9mL GADAVIST GADOBUTROL 1 MMOL/ML IV SOLN COMPARISON:  CT abdomen pelvis, 06/14/2024, 6:11 a.m. FINDINGS: Lower chest: No acute abnormality.  Status post right mastectomy. Hepatobiliary: No solid liver abnormality is seen. Mild hepatic steatosis. Status post cholecystectomy. No biliary ductal dilatation, common bile duct at the upper limit of normal in size measuring up to 0.7 cm. Pancreas: Mild, diffuse inflammatory fat stranding and fluid about the pancreas and adjacent retroperitoneum (series 9, image 19). No evidence of pancreatic parenchymal necrosis or acute pancreatic fluid collection. The pancreatic duct is focally effaced along a short segment within the central pancreatic head,  reconstituted near the ampulla (series 3, image 19). Subtle, focal hypoenhancement in this vicinity without definitive evidence of mass (series 26, image 59). No pancreatic ductal dilatation. Spleen: Normal in size without significant abnormality. Adrenals/Urinary Tract: Adrenal glands are unremarkable. Kidneys are normal, without renal calculi, solid lesion, or hydronephrosis. Stomach/Bowel: Stomach is within normal limits. No evidence of bowel wall thickening, distention, or inflammatory changes. Vascular/Lymphatic: No significant vascular findings are present.  No enlarged abdominal lymph nodes. Other: No abdominal wall hernia or abnormality. No ascites. Musculoskeletal: No acute or significant osseous findings. IMPRESSION: 1. Mild, diffuse inflammatory fat stranding and fluid about the pancreas and adjacent retroperitoneum, consistent with acute pancreatitis. No evidence of pancreatic parenchymal necrosis or acute pancreatic fluid collection. 2. The pancreatic duct is focally effaced along a short segment within the central pancreatic head, reconstituted near the ampulla. Subtle, focal hypoenhancement in this vicinity without definitive evidence of mass, and this may reflect focal edema in the setting of pancreatitis, however this must be regarded with substantial suspicion for underlying mass. Consider ERCP/EUS to further assess the pancreatic head as well as short interval follow-up imaging in 1-2 months following resolution of acute clinical presentation. 3. Status post cholecystectomy.  No biliary ductal dilatation. 4. Mild hepatic steatosis. 5. Status post right mastectomy. Electronically Signed   By: Marolyn JONETTA Jaksch M.D.   On: 06/14/2024 20:57   CT ABDOMEN PELVIS W CONTRAST Result Date: 06/14/2024 CLINICAL DATA:  Abdominal pain EXAM: CT ABDOMEN AND PELVIS WITH CONTRAST TECHNIQUE: Multidetector CT imaging of the abdomen and pelvis was performed using the standard protocol following bolus administration of  intravenous contrast. RADIATION DOSE REDUCTION: This exam was performed according to the departmental dose-optimization program which includes automated exposure control, adjustment of the mA and/or kV according to patient size and/or use of iterative reconstruction technique. CONTRAST:  100mL OMNIPAQUE IOHEXOL 300 MG/ML  SOLN COMPARISON:  None Available. FINDINGS: Lower chest: No acute abnormality. Hepatobiliary: No focal liver abnormality is seen. Status post cholecystectomy. No biliary dilatation. Pancreas: The pancreas appears diffusely edematous with peripancreatic fat stranding. Trace amount of fluid extends from the tail of pancreas into the left retroperitoneum. No main duct dilatation, inflammation or suspicious mass. No signs of pancreatic necrosis or pseudocyst. Spleen: Normal in size without focal abnormality. Adrenals/Urinary Tract: Normal adrenal glands. Bilateral renal cortical scarring. No mass or obstructive uropathy. Upper pole left kidney stone measures 6 mm, image 36/2. Bladder is normal. Stomach/Bowel: Stomach is within normal limits. Appendix appears normal. No evidence of bowel wall thickening, distention, or inflammatory changes. Sigmoid diverticulosis without signs of diverticulitis. Vascular/Lymphatic: Aortic atherosclerosis. No enlarged abdominal or pelvic lymph nodes. Reproductive: Status post hysterectomy. No adnexal masses. Other: No abdominal wall hernia or abnormality. No abdominopelvic ascites. Musculoskeletal: Lumbar degenerative disc disease is identified at L4-5 and L5-S1. No acute or suspicious osseous findings. Postoperative changes involving the right breast with implant in place. IMPRESSION: 1. The pancreas appears diffusely edematous with peripancreatic fat stranding. Trace amount of fluid extends from the tail of pancreas into the left retroperitoneum. Findings are compatible with acute pancreatitis. No signs of pancreatic necrosis or pseudocyst. 2. Nonobstructing left renal  calculus. 3. Sigmoid diverticulosis without signs of diverticulitis. 4.  Aortic Atherosclerosis (ICD10-I70.0). Electronically Signed   By: Waddell Calk M.D.   On: 06/14/2024 06:27   DG Chest Port 1 View Result Date: 06/14/2024 CLINICAL DATA:  Central epigastric pain onset yesterday worsening today. EXAM: PORTABLE CHEST 1 VIEW COMPARISON:  PA Lat chest 04/21/2018 FINDINGS: The heart size and mediastinal contours are within normal limits. There is calcification in the transverse aorta. Both lungs are clear. The visualized skeletal structures are intact, with degenerative change of the spine. IMPRESSION: No evidence of acute chest disease. Aortic atherosclerosis. Stable chest. Electronically Signed   By: Francis Quam M.D.   On: 06/14/2024 05:16      Impression / Plan:   Assessment: Principal Problem:   Acute pancreatitis  Clatie Heft is a 63 y.o. y/o female with admission for acute pancreatitis with a finding on an MRI of Subtle, focal hypoenhancement in this vicinity without definitive evidence of mass, and this may reflect focal edema in the setting of pancreatitis, however this must be regarded with substantial suspicion for underlying mass. Consider ERCP/EUS to further assess the pancreatic head as well as short interval follow-up imaging in 1-2 months following resolution of acute clinical presentation.  I am now being asked if the patient needs an ERCP at this time.  Plan:  An ERCP at this time would be high risk for reactivating her pancreatitis and making the pancreatitis even worse.  The patient's yield from an ERCP is not going to be much better than the MRCP and just give me the anatomy of the ducts.  Brushing of the pancreatic duct to rule out malignancy would also increase the risk of pancreatitis and is quite invasive for this subtle finding.  The patient has been told that she should have repeat imaging of her pancreas when her pancreatitis has resolved as recommended by the  radiologist and if it is still present then the patient should undergo an endoscopic ultrasound with possible sampling of the lesion which would be much more sensitive and specific.   The patient has been explained this and has been told to follow-up with her primary care provider to have a repeat MRCP in 3 to 4 weeks.  I will sign off.  Please call if any further GI concerns or questions.  We would like to thank you for the opportunity to participate in the care of Pebble Deckard.   Thank you for involving me in the care of this patient.      LOS: 1 day   Rogelia Copping, MD, MD. [place] 06/15/2024, 2:33 PM,  Pager [phone] 7am-5pm  Check AMION for 5pm -7am coverage and on weekends   Note: This dictation was prepared with Dragon dictation along with smaller phrase technology. Any transcriptional errors that result from this process are unintentional.

## 2024-06-15 NOTE — Progress Notes (Signed)
 Progress Note   Patient: Diana Deleon FMW:[id number] DOB: Mar 29, 1961 DOA: 06/14/2024     1 DOS: the patient was seen and examined on 06/15/2024   Brief hospital course: Taken from H&P.  Diana Deleon is a 63 y.o. female with medical history significant of  Anemia, Right BRCA,  DMII, GERD, HTN, hypothyroidism, HLD, classII obesity who presents to ED with complaint of chest pain/epigasgastric pain x 1 day that has been progressive.  On presentation mildly elevated blood pressure otherwise stable vitals.  Labs with potassium of 3.1, lipase 1818. Chest x-ray without any acute abnormality CT abdomen and pelvis with diffusely edematous peripancreatic fat stranding and a trace amount of fluid consistent with acute pancreatitis.  No sign of pancreatic necrosis or pseudocyst.  Patient was admitted for acute pancreatitis.  6/30: Blood pressure mildly elevated otherwise stable vital, hypokalemia resolved, mildly elevated T. bili at 1.5.  Lipid panel normal.  MRCP with some concern of pancreatic head lesion versus edema with current inflammation.  No risk factors as patient does not drink, lipid panel normal, no new medications. Case was discussed with gastroenterology as there was recommendation to have ERCP.  Dr. Jinny will see her and recommending to repeat MRCP once pancreatitis improves and then they can decide what procedure she will need.  Advancing diet to full liquid as symptoms improving.  Assessment and Plan: * Acute pancreatitis Clinically seems improving.  GI was consulted due to concerning MRCP, no underlying risk for pancreatitis at this time.  Patient does not use alcohol, lipid panel normal, no new medications or supplement.  No prior incidence.  Liver enzymes normal except mildly elevated T. Bili. Nausea vomiting has been improved. - Advance diet to full liquid -GI is recommending repeating MRCP in 3 to 4 weeks and then reassess -Continue supportive care -Will advance diet as  tolerated  Anemia Hemoglobin stable. - Continue to monitor  Diabetes mellitus without complication [facility]) Patient uses Trulicity  and glipizide  at home. -Continue with SSI  Hypertension BP mildly elevated. -Continue home metoprolol  and Hyzaar  Hypothyroidism - Continue with syntheroid  Hyperlipidemia -Continue with statin  Cancer of right breast (HCC) S/P Mastectomy.Completed treatment. -Out patient follow up.  Class II obesity Estimated body mass index is 36.05 kg/m as calculated from the following:   Height as of this encounter: 5' 4 (1.626 m).   Weight as of this encounter: 95.3 kg.   -This will complicate overall prognosis. -Encourage wt loss    Subjective: Patient was seen and examined today.  No more nausea or vomiting.  Some lower abdominal pain.  Last BM was on Friday.  Agreed to try full liquid.  Physical Exam: Vitals:   06/14/24 1628 06/14/24 2036 06/15/24 0311 06/15/24 0816  BP: 134/77 (!) 142/77 134/88 (!) 141/80  Pulse: 79 86 93 95  Resp: 18 20 20 16   Temp: 97.6 F (36.4 C) 98.9 F (37.2 C) 99.5 F (37.5 C) 99.6 F (37.6 C)  TempSrc: Oral Oral Oral Oral  SpO2: 97% 99% 100% 95%  Weight:      Height:       General. Obese lady, In no acute distress. Pulmonary.  Lungs clear bilaterally, normal respiratory effort. CV.  Regular rate and rhythm, no JVD, rub or murmur. Abdomen.  Soft, nontender, nondistended, BS positive. CNS.  Alert and oriented .  No focal neurologic deficit. Extremities.  No edema,  pulses intact and symmetrical. Psychiatry.  Judgment and insight appears normal.   Data Reviewed: Prior data reviewed.  Family  Communication: Discussed with daughter at bedside.  Disposition: Status is: Inpatient Remains inpatient appropriate because: severity of illness.  Planned Discharge Destination: Home  DVT prophylaxis.  Subcu heparin  Time spent: 50 minutes  This record has been created using Conservation officer, historic buildings. Errors  have been sought and corrected,but may not always be located. Such creation errors do not reflect on the standard of care.   Author: Amaryllis Dare, MD 06/15/2024 3:33 PM  For on call review [email or web address].

## 2024-06-15 NOTE — Assessment & Plan Note (Signed)
 Patient uses Trulicity  and glipizide  at home. -Continue with SSI

## 2024-06-15 NOTE — Assessment & Plan Note (Signed)
 BP mildly elevated. -Continue home metoprolol  and Hyzaar

## 2024-06-15 NOTE — Assessment & Plan Note (Signed)
 S/P Mastectomy.Completed treatment. -Out patient follow up.

## 2024-06-15 NOTE — Hospital Course (Addendum)
 Taken from H&P.  Diana Deleon is a 63 y.o. female with medical history significant of  Anemia, Right BRCA,  DMII, GERD, HTN, hypothyroidism, HLD, classII obesity who presents to ED with complaint of chest pain/epigasgastric pain x 1 day that has been progressive.  On presentation mildly elevated blood pressure otherwise stable vitals.  Labs with potassium of 3.1, lipase 1818. Chest x-ray without any acute abnormality CT abdomen and pelvis with diffusely edematous peripancreatic fat stranding and a trace amount of fluid consistent with acute pancreatitis.  No sign of pancreatic necrosis or pseudocyst.  Patient was admitted for acute pancreatitis.  6/30: Blood pressure mildly elevated otherwise stable vital, hypokalemia resolved, mildly elevated T. bili at 1.5.  Lipid panel normal.  MRCP with some concern of pancreatic head lesion versus edema with current inflammation.  No risk factors as patient does not drink, lipid panel normal, no new medications. Case was discussed with gastroenterology as there was recommendation to have ERCP.  Dr. Jinny will see her and recommending to repeat MRCP once pancreatitis improves and then they can decide what procedure she will need.  Advancing diet to full liquid as symptoms improving.  7/1: Remained hemodynamically stable, lipase improved to 126, persistent mild elevation of T. bili at 1.5.  Mild hypokalemia with potassium at 3.3, normal magnesium , potassium was repleted.  Patient takes potassium supplements regularly at home which she will continue.  Patient was able to tolerate soft diet, had a bowel movement.  Significant improvement in pain.  No nausea or vomiting.  Pain with significant improvement.  She was given some oxycodone  to use as needed.  Patient is being discharged and continue home medications.  She need a repeat pancreatic MRI in 3 to 4 weeks and if there is still a concern then she will need EUS/ERCP and biopsy.  She will continue on current  medications and follow-up with her providers for further assistance.

## 2024-06-15 NOTE — Assessment & Plan Note (Signed)
 Continue with statin

## 2024-06-15 NOTE — Assessment & Plan Note (Signed)
 Estimated body mass index is 36.05 kg/m as calculated from the following:   Height as of this encounter: 5' 4 (1.626 m).   Weight as of this encounter: 95.3 kg.   -This will complicate overall prognosis. -Encourage wt loss

## 2024-06-15 NOTE — Assessment & Plan Note (Signed)
-   Continue with syntheroid

## 2024-06-16 DIAGNOSIS — E039 Hypothyroidism, unspecified: Secondary | ICD-10-CM | POA: Diagnosis not present

## 2024-06-16 DIAGNOSIS — K859 Acute pancreatitis without necrosis or infection, unspecified: Secondary | ICD-10-CM | POA: Diagnosis not present

## 2024-06-16 DIAGNOSIS — I10 Essential (primary) hypertension: Secondary | ICD-10-CM | POA: Diagnosis not present

## 2024-06-16 DIAGNOSIS — E119 Type 2 diabetes mellitus without complications: Secondary | ICD-10-CM | POA: Diagnosis not present

## 2024-06-16 LAB — COMPREHENSIVE METABOLIC PANEL WITH GFR
ALT: 19 U/L (ref 0–44)
AST: 23 U/L (ref 15–41)
Albumin: 3.1 g/dL — ABNORMAL LOW (ref 3.5–5.0)
Alkaline Phosphatase: 66 U/L (ref 38–126)
Anion gap: 11 (ref 5–15)
BUN: 8 mg/dL (ref 8–23)
CO2: 27 mmol/L (ref 22–32)
Calcium: 8.2 mg/dL — ABNORMAL LOW (ref 8.9–10.3)
Chloride: 103 mmol/L (ref 98–111)
Creatinine, Ser: 0.87 mg/dL (ref 0.44–1.00)
GFR, Estimated: >60 mL/min (ref >60)
Glucose, Bld: 110 mg/dL — ABNORMAL HIGH (ref 70–99)
Potassium: 3.3 mmol/L — ABNORMAL LOW (ref 3.5–5.1)
Sodium: 141 mmol/L (ref 135–145)
Total Bilirubin: 1.5 mg/dL — ABNORMAL HIGH (ref 0.0–1.2)
Total Protein: 6.5 g/dL (ref 6.5–8.1)

## 2024-06-16 LAB — LIPASE, BLOOD: Lipase: 126 U/L — ABNORMAL HIGH (ref 11–51)

## 2024-06-16 LAB — MAGNESIUM: Magnesium: 2 mg/dL (ref 1.7–2.4)

## 2024-06-16 LAB — GLUCOSE, CAPILLARY: Glucose-Capillary: 104 mg/dL — ABNORMAL HIGH (ref 70–99)

## 2024-06-16 MED ORDER — POTASSIUM CHLORIDE CRYS ER 20 MEQ PO TBCR
40.0000 meq | EXTENDED_RELEASE_TABLET | Freq: Once | ORAL | Status: AC
  Administered 2024-06-16: 40 meq via ORAL
  Filled 2024-06-16: qty 2

## 2024-06-16 MED ORDER — POLYETHYLENE GLYCOL 3350 17 G PO PACK: 17.0000 g | PACK | Freq: Every day | ORAL | 0 refills | Status: AC

## 2024-06-16 MED ORDER — OXYCODONE HCL 5 MG PO TABS: 5.0000 mg | ORAL_TABLET | ORAL | 0 refills | Status: AC | PRN

## 2024-06-16 NOTE — Telephone Encounter (Signed)
 Last filled on 11/25/23 #6 mL/ 1 refill  Next appt is a hospital f/u tomorrow

## 2024-06-16 NOTE — Plan of Care (Signed)
  Problem: Education: Goal: Ability to describe self-care measures that may prevent or decrease complications (Diabetes Survival Skills Education) will improve Outcome: Progressing Goal: Individualized Educational Video(s) Outcome: Progressing   Problem: Coping: Goal: Ability to adjust to condition or change in health will improve Outcome: Progressing   Problem: Fluid Volume: Goal: Ability to maintain a balanced intake and output will improve Outcome: Progressing   Problem: Health Behavior/Discharge Planning: Goal: Ability to identify and utilize available resources and services will improve Outcome: Progressing Goal: Ability to manage health-related needs will improve Outcome: Progressing   Problem: Metabolic: Goal: Ability to maintain appropriate glucose levels will improve Outcome: Progressing   Problem: Nutritional: Goal: Maintenance of adequate nutrition will improve Outcome: Progressing Goal: Progress toward achieving an optimal weight will improve Outcome: Progressing   Problem: Skin Integrity: Goal: Risk for impaired skin integrity will decrease Outcome: Progressing   Problem: Tissue Perfusion: Goal: Adequacy of tissue perfusion will improve Outcome: Progressing   Problem: Education: Goal: Knowledge of General Education information will improve Description: Including pain rating scale, medication(s)/side effects and non-pharmacologic comfort measures Outcome: Progressing   Problem: Health Behavior/Discharge Planning: Goal: Ability to manage health-related needs will improve Outcome: Progressing   Problem: Clinical Measurements: Goal: Ability to maintain clinical measurements within normal limits will improve Outcome: Progressing Goal: Will remain free from infection Outcome: Progressing Goal: Diagnostic test results will improve Outcome: Progressing Goal: Respiratory complications will improve Outcome: Progressing Goal: Cardiovascular complication will  be avoided Outcome: Progressing   Problem: Activity: Goal: Risk for activity intolerance will decrease Outcome: Progressing   Problem: Nutrition: Goal: Adequate nutrition will be maintained Outcome: Progressing   Problem: Coping: Goal: Level of anxiety will decrease Outcome: Progressing   Problem: Elimination: Goal: Will not experience complications related to bowel motility Outcome: Progressing Goal: Will not experience complications related to urinary retention Outcome: Progressing   Problem: Pain Managment: Goal: General experience of comfort will improve and/or be controlled Outcome: Progressing   Problem: Safety: Goal: Ability to remain free from injury will improve Outcome: Progressing   Problem: Skin Integrity: Goal: Risk for impaired skin integrity will decrease Outcome: Progressing   Problem: Education: Goal: Individualized Educational Video(s) Outcome: Progressing   Problem: Coping: Goal: Ability to adjust to condition or change in health will improve Outcome: Progressing   Problem: Fluid Volume: Goal: Ability to maintain a balanced intake and output will improve Outcome: Progressing   Problem: Health Behavior/Discharge Planning: Goal: Ability to identify and utilize available resources and services will improve Outcome: Progressing   Problem: Health Behavior/Discharge Planning: Goal: Ability to manage health-related needs will improve Outcome: Progressing

## 2024-06-16 NOTE — Telephone Encounter (Signed)
 I need to hold off on refill of this until we have follow up  She was hospitalized for pancreatitis

## 2024-06-16 NOTE — TOC CM/SW Note (Signed)
 Transition of Care [facility]) - Inpatient Brief Assessment   Patient Details  Name: Ovetta Bazzano MRN: [id number] Date of Birth: 25-Jan-1961  Transition of Care [facility]) CM/SW Contact:    Lauraine JAYSON Carpen, LCSW Phone Number: 06/16/2024, 10:46 AM   Clinical Narrative: Patient has orders to discharge home today. Chart reviewed. No TOC needs identified. CSW signing off.  Transition of Care Asessment: Insurance and Status: Insurance coverage has been reviewed Patient has primary care physician: Yes Home environment has been reviewed: Single family home Prior level of function:: Not documented Prior/Current Home Services: No current home services Social Drivers of Health Review: SDOH reviewed no interventions necessary Readmission risk has been reviewed: Yes Transition of care needs: no transition of care needs at this time

## 2024-06-16 NOTE — Discharge Summary (Signed)
 Physician Discharge Summary   Patient: Diana Deleon MRN: [id number] DOB: Jan 09, 1961  Admit date:     06/14/2024  Discharge date: 06/16/24  Discharge Physician: Amaryllis Dare   PCP: Randeen Laine LABOR, MD   Recommendations at discharge:  Please obtain CBC and CMP on follow-up Patient needed repeat pancreatic MRI in 3 to 4 weeks and if continued to have suspicious lesion then need EUS/ERCP and biopsy. Follow-up with primary care provider Follow-up with gastroenterology  Discharge Diagnoses: Principal Problem:   Acute pancreatitis Active Problems:   Anemia   Diabetes mellitus without complication (HCC)   Hypertension   Hypothyroidism   Hyperlipidemia   Cancer of right breast (HCC)   Class II obesity   Abnormal MRI of abdomen   Hospital Course: Taken from H&P.  Diana Deleon is a 63 y.o. female with medical history significant of  Anemia, Right BRCA,  DMII, GERD, HTN, hypothyroidism, HLD, classII obesity who presents to ED with complaint of chest pain/epigasgastric pain x 1 day that has been progressive.  On presentation mildly elevated blood pressure otherwise stable vitals.  Labs with potassium of 3.1, lipase 1818. Chest x-ray without any acute abnormality CT abdomen and pelvis with diffusely edematous peripancreatic fat stranding and a trace amount of fluid consistent with acute pancreatitis.  No sign of pancreatic necrosis or pseudocyst.  Patient was admitted for acute pancreatitis.  6/30: Blood pressure mildly elevated otherwise stable vital, hypokalemia resolved, mildly elevated T. bili at 1.5.  Lipid panel normal.  MRCP with some concern of pancreatic head lesion versus edema with current inflammation.  No risk factors as patient does not drink, lipid panel normal, no new medications. Case was discussed with gastroenterology as there was recommendation to have ERCP.  Dr. Jinny will see her and recommending to repeat MRCP once pancreatitis improves and then they can decide what  procedure she will need.  Advancing diet to full liquid as symptoms improving.  7/1: Remained hemodynamically stable, lipase improved to 126, persistent mild elevation of T. bili at 1.5.  Mild hypokalemia with potassium at 3.3, normal magnesium , potassium was repleted.  Patient takes potassium supplements regularly at home which she will continue.  Patient was able to tolerate soft diet, had a bowel movement.  Significant improvement in pain.  No nausea or vomiting.  Pain with significant improvement.  She was given some oxycodone  to use as needed.  Patient is being discharged and continue home medications.  She need a repeat pancreatic MRI in 3 to 4 weeks and if there is still a concern then she will need EUS/ERCP and biopsy.  She will continue on current medications and follow-up with her providers for further assistance.  Assessment and Plan: * Acute pancreatitis Clinically seems improving.  GI was consulted due to concerning MRCP, no underlying risk for pancreatitis at this time.  Patient does not use alcohol, lipid panel normal, no new medications or supplement.  No prior incidence.  Liver enzymes normal except mildly elevated T. Bili. Nausea vomiting has been improved. Tolerating soft diet. -GI is recommending repeating MRCP in 3 to 4 weeks and then reassess -Continue supportive care -Will advance diet as tolerated  Anemia Hemoglobin stable. - Continue to monitor  Diabetes mellitus without complication [facility]) Patient uses Trulicity  and glipizide  at home. -Continue with SSI  Hypertension BP mildly elevated. -Continue home metoprolol  and Hyzaar  Hypothyroidism - Continue with syntheroid  Hyperlipidemia -Continue with statin  Cancer of right breast (HCC) S/P Mastectomy.Completed treatment. -Out patient follow up.  Class II obesity Estimated body mass index is 36.05 kg/m as calculated from the following:   Height as of this encounter: 5' 4 (1.626 m).   Weight as of  this encounter: 95.3 kg.   -This will complicate overall prognosis. -Encourage wt loss    Pain control - [place]  Controlled Substance Reporting System database was reviewed. and patient was instructed, not to drive, operate heavy machinery, perform activities at heights, swimming or participation in water  activities or provide baby-sitting services while on Pain, Sleep and Anxiety Medications; until their outpatient Physician has advised to do so again. Also recommended to not to take more than prescribed Pain, Sleep and Anxiety Medications.  Consultants: Gastroenterology Procedures performed: None Disposition: Home Diet recommendation:  Discharge Diet Orders (From admission, onward)     Start     Ordered   06/16/24 0000  Diet - low sodium heart healthy        06/16/24 1029           Cardiac and Carb modified diet DISCHARGE MEDICATION: Allergies as of 06/16/2024       Reactions   Caltrate 600+d Plus Minerals [calcium  600 + Minerals]    Nausea    Metformin  And Related Diarrhea   Norvasc [amlodipine Besylate] Swelling        Medication List     TAKE these medications    Calcium  Carb-Cholecalciferol  600-800 MG-UNIT Tabs Take 2 tablets by mouth every evening.   Dexcom G6 Receiver Devi Use as directed for diabetes   Dexcom G6 Sensor Misc 1 each by Does not apply route as directed.   Dexcom G6 Transmitter Misc Use as directed for diabetes   ergocalciferol  1.25 MG (50000 UT) capsule Commonly known as: VITAMIN D2 Take 1 capsule (50,000 Units total) by mouth once a week.   gabapentin  300 MG capsule Commonly known as: NEURONTIN  TAKE 1 CAPSULE BY MOUTH EVERYDAY AT BEDTIME   glipiZIDE  10 MG 24 hr tablet Commonly known as: GLUCOTROL  XL TAKE 1 TABLET (10 MG TOTAL) BY MOUTH DAILY WITH BREAKFAST.   insulin  glargine 100 UNIT/ML injection Commonly known as: Semglee  Inject 0.75 mLs (75 Units total) into the skin daily.   levothyroxine  50 MCG tablet Commonly  known as: SYNTHROID  TAKE 1 TABLET BY MOUTH EVERY DAY BEFORE BREAKFAST   losartan -hydrochlorothiazide  100-25 MG tablet Commonly known as: HYZAAR TAKE 1 TABLET BY MOUTH EVERY DAY   metoprolol  succinate 50 MG 24 hr tablet Commonly known as: TOPROL -XL TAKE 1 TABLET BY MOUTH EVERY DAY WITH OR IMMEDIATELY FOLLOWING A MEAL   multivitamin with minerals Tabs tablet Take 1 tablet by mouth every evening.   omeprazole  20 MG capsule Commonly known as: PRILOSEC  TAKE 1 CAPSULE BY MOUTH DAILY BEFORE BREAKFAST   OneTouch Delica Lancets Fine Misc Use to check blood sugar once daily and as needed (dx. E11.9)   OneTouch Verio test strip Generic drug: glucose blood USE TO CHECK BLOOD SUGAR ONCE DAILY AND AS NEEDED   oxyCODONE  5 MG immediate release tablet Commonly known as: Oxy IR/ROXICODONE  Take 1 tablet (5 mg total) by mouth every 4 (four) hours as needed for moderate pain (pain score 4-6).   polyethylene glycol 17 g packet Commonly known as: MIRALAX  / GLYCOLAX  Take 17 g by mouth daily. Start taking on: June 17, 2024   potassium chloride  10 MEQ tablet Commonly known as: KLOR-CON  TAKE 1 TABLET BY MOUTH EVERY DAY   rosuvastatin  5 MG tablet Commonly known as: CRESTOR  TAKE 1 TABLET (5 MG TOTAL) BY  MOUTH DAILY.   SKYRIZI (150 MG DOSE) [place] Inject 150 mcg into the skin every 3 (three) months. Take as directed (every 3 month)   Trulicity  4.5 MG/0.5ML [place] Generic drug: Dulaglutide  INJECT 4.5 MG AS DIRECTED ONCE A WEEK.   valACYclovir  500 MG tablet Commonly known as: VALTREX  TAKE 1 TABLET(500 MG) BY MOUTH TWICE DAILY FOR 5 DAYS FOR OUTBREAK   venlafaxine  XR 75 MG 24 hr capsule Commonly known as: EFFEXOR -XR TAKE 1 CAPSULE(75 MG) BY MOUTH DAILY WITH BREAKFAST        Follow-up Information     Tower, Laine LABOR, MD. Schedule an appointment as soon as possible for a visit in 1 week(s).   Specialties: Family Medicine, Radiology Contact information: [address] [place]  [address] [phone]         Therisa Bi, MD. Schedule an appointment as soon as possible for a visit in 1 month(s).   Specialty: Gastroenterology Contact information: [address] [place] [address] [place] [address] [phone]                Discharge Exam: Diana Deleon   06/14/24 0426  Weight: 95.3 kg   General.  Obese lady, in no acute distress. Pulmonary.  Lungs clear bilaterally, normal respiratory effort. CV.  Regular rate and rhythm, no JVD, rub or murmur. Abdomen.  Soft, nontender, nondistended, BS positive. CNS.  Alert and oriented .  No focal neurologic deficit. Extremities.  No edema, no cyanosis, pulses intact and symmetrical. Psychiatry.  Judgment and insight appears normal.   Condition at discharge: stable  The results of significant diagnostics from this hospitalization (including imaging, microbiology, ancillary and laboratory) are listed below for reference.   Imaging Studies: MR ABDOMEN MRCP W WO CONTAST Result Date: 06/14/2024 CLINICAL DATA:  Acute pancreatitis EXAM: MRI ABDOMEN WITHOUT AND WITH CONTRAST (INCLUDING MRCP) TECHNIQUE: Multiplanar multisequence MR imaging of the abdomen was performed both before and after the administration of intravenous contrast. Heavily T2-weighted images of the biliary and pancreatic ducts were obtained, and three-dimensional MRCP images were rendered by post processing. CONTRAST:  9mL GADAVIST GADOBUTROL 1 MMOL/ML IV SOLN COMPARISON:  CT abdomen pelvis, 06/14/2024, 6:11 a.m. FINDINGS: Lower chest: No acute abnormality.  Status post right mastectomy. Hepatobiliary: No solid liver abnormality is seen. Mild hepatic steatosis. Status post cholecystectomy. No biliary ductal dilatation, common bile duct at the upper limit of normal in size measuring up to 0.7 cm. Pancreas: Mild, diffuse inflammatory fat stranding and fluid about the pancreas and adjacent retroperitoneum (series 9, image 19). No evidence of pancreatic  parenchymal necrosis or acute pancreatic fluid collection. The pancreatic duct is focally effaced along a short segment within the central pancreatic head, reconstituted near the ampulla (series 3, image 19). Subtle, focal hypoenhancement in this vicinity without definitive evidence of mass (series 26, image 59). No pancreatic ductal dilatation. Spleen: Normal in size without significant abnormality. Adrenals/Urinary Tract: Adrenal glands are unremarkable. Kidneys are normal, without renal calculi, solid lesion, or hydronephrosis. Stomach/Bowel: Stomach is within normal limits. No evidence of bowel wall thickening, distention, or inflammatory changes. Vascular/Lymphatic: No significant vascular findings are present. No enlarged abdominal lymph nodes. Other: No abdominal wall hernia or abnormality. No ascites. Musculoskeletal: No acute or significant osseous findings. IMPRESSION: 1. Mild, diffuse inflammatory fat stranding and fluid about the pancreas and adjacent retroperitoneum, consistent with acute pancreatitis. No evidence of pancreatic parenchymal necrosis or acute pancreatic fluid collection. 2. The pancreatic duct is focally effaced along a short segment within  the central pancreatic head, reconstituted near the ampulla. Subtle, focal hypoenhancement in this vicinity without definitive evidence of mass, and this may reflect focal edema in the setting of pancreatitis, however this must be regarded with substantial suspicion for underlying mass. Consider ERCP/EUS to further assess the pancreatic head as well as short interval follow-up imaging in 1-2 months following resolution of acute clinical presentation. 3. Status post cholecystectomy.  No biliary ductal dilatation. 4. Mild hepatic steatosis. 5. Status post right mastectomy. Electronically Signed   By: Marolyn JONETTA Jaksch M.D.   On: 06/14/2024 20:57   MR 3D Recon At Scanner Result Date: 06/14/2024 CLINICAL DATA:  Acute pancreatitis EXAM: MRI ABDOMEN WITHOUT  AND WITH CONTRAST (INCLUDING MRCP) TECHNIQUE: Multiplanar multisequence MR imaging of the abdomen was performed both before and after the administration of intravenous contrast. Heavily T2-weighted images of the biliary and pancreatic ducts were obtained, and three-dimensional MRCP images were rendered by post processing. CONTRAST:  9mL GADAVIST GADOBUTROL 1 MMOL/ML IV SOLN COMPARISON:  CT abdomen pelvis, 06/14/2024, 6:11 a.m. FINDINGS: Lower chest: No acute abnormality.  Status post right mastectomy. Hepatobiliary: No solid liver abnormality is seen. Mild hepatic steatosis. Status post cholecystectomy. No biliary ductal dilatation, common bile duct at the upper limit of normal in size measuring up to 0.7 cm. Pancreas: Mild, diffuse inflammatory fat stranding and fluid about the pancreas and adjacent retroperitoneum (series 9, image 19). No evidence of pancreatic parenchymal necrosis or acute pancreatic fluid collection. The pancreatic duct is focally effaced along a short segment within the central pancreatic head, reconstituted near the ampulla (series 3, image 19). Subtle, focal hypoenhancement in this vicinity without definitive evidence of mass (series 26, image 59). No pancreatic ductal dilatation. Spleen: Normal in size without significant abnormality. Adrenals/Urinary Tract: Adrenal glands are unremarkable. Kidneys are normal, without renal calculi, solid lesion, or hydronephrosis. Stomach/Bowel: Stomach is within normal limits. No evidence of bowel wall thickening, distention, or inflammatory changes. Vascular/Lymphatic: No significant vascular findings are present. No enlarged abdominal lymph nodes. Other: No abdominal wall hernia or abnormality. No ascites. Musculoskeletal: No acute or significant osseous findings. IMPRESSION: 1. Mild, diffuse inflammatory fat stranding and fluid about the pancreas and adjacent retroperitoneum, consistent with acute pancreatitis. No evidence of pancreatic parenchymal  necrosis or acute pancreatic fluid collection. 2. The pancreatic duct is focally effaced along a short segment within the central pancreatic head, reconstituted near the ampulla. Subtle, focal hypoenhancement in this vicinity without definitive evidence of mass, and this may reflect focal edema in the setting of pancreatitis, however this must be regarded with substantial suspicion for underlying mass. Consider ERCP/EUS to further assess the pancreatic head as well as short interval follow-up imaging in 1-2 months following resolution of acute clinical presentation. 3. Status post cholecystectomy.  No biliary ductal dilatation. 4. Mild hepatic steatosis. 5. Status post right mastectomy. Electronically Signed   By: Marolyn JONETTA Jaksch M.D.   On: 06/14/2024 20:57   CT ABDOMEN PELVIS W CONTRAST Result Date: 06/14/2024 CLINICAL DATA:  Abdominal pain EXAM: CT ABDOMEN AND PELVIS WITH CONTRAST TECHNIQUE: Multidetector CT imaging of the abdomen and pelvis was performed using the standard protocol following bolus administration of intravenous contrast. RADIATION DOSE REDUCTION: This exam was performed according to the departmental dose-optimization program which includes automated exposure control, adjustment of the mA and/or kV according to patient size and/or use of iterative reconstruction technique. CONTRAST:  100mL OMNIPAQUE IOHEXOL 300 MG/ML  SOLN COMPARISON:  None Available. FINDINGS: Lower chest: No acute abnormality. Hepatobiliary: No focal liver  abnormality is seen. Status post cholecystectomy. No biliary dilatation. Pancreas: The pancreas appears diffusely edematous with peripancreatic fat stranding. Trace amount of fluid extends from the tail of pancreas into the left retroperitoneum. No main duct dilatation, inflammation or suspicious mass. No signs of pancreatic necrosis or pseudocyst. Spleen: Normal in size without focal abnormality. Adrenals/Urinary Tract: Normal adrenal glands. Bilateral renal cortical  scarring. No mass or obstructive uropathy. Upper pole left kidney stone measures 6 mm, image 36/2. Bladder is normal. Stomach/Bowel: Stomach is within normal limits. Appendix appears normal. No evidence of bowel wall thickening, distention, or inflammatory changes. Sigmoid diverticulosis without signs of diverticulitis. Vascular/Lymphatic: Aortic atherosclerosis. No enlarged abdominal or pelvic lymph nodes. Reproductive: Status post hysterectomy. No adnexal masses. Other: No abdominal wall hernia or abnormality. No abdominopelvic ascites. Musculoskeletal: Lumbar degenerative disc disease is identified at L4-5 and L5-S1. No acute or suspicious osseous findings. Postoperative changes involving the right breast with implant in place. IMPRESSION: 1. The pancreas appears diffusely edematous with peripancreatic fat stranding. Trace amount of fluid extends from the tail of pancreas into the left retroperitoneum. Findings are compatible with acute pancreatitis. No signs of pancreatic necrosis or pseudocyst. 2. Nonobstructing left renal calculus. 3. Sigmoid diverticulosis without signs of diverticulitis. 4.  Aortic Atherosclerosis (ICD10-I70.0). Electronically Signed   By: Waddell Calk M.D.   On: 06/14/2024 06:27   DG Chest Port 1 View Result Date: 06/14/2024 CLINICAL DATA:  Central epigastric pain onset yesterday worsening today. EXAM: PORTABLE CHEST 1 VIEW COMPARISON:  PA Lat chest 04/21/2018 FINDINGS: The heart size and mediastinal contours are within normal limits. There is calcification in the transverse aorta. Both lungs are clear. The visualized skeletal structures are intact, with degenerative change of the spine. IMPRESSION: No evidence of acute chest disease. Aortic atherosclerosis. Stable chest. Electronically Signed   By: Francis Quam M.D.   On: 06/14/2024 05:16    Microbiology: Results for orders placed or performed in visit on 03/28/22  Urine Culture     Status: Abnormal   Collection Time: 03/28/22   8:49 AM   Specimen: Urine  Result Value Ref Range Status   MICRO NUMBER: [id number]  Final   SPECIMEN QUALITY: Adequate  Final   Sample Source NOT GIVEN  Final   STATUS: FINAL  Final   ISOLATE 1: Escherichia coli (A)  Final    Comment: Greater than 100,000 CFU/mL of Escherichia coli      Susceptibility   Escherichia coli - URINE CULTURE, REFLEX    AMOX/CLAVULANIC <=2 Sensitive     AMPICILLIN <=2 Sensitive     AMPICILLIN/SULBACTAM <=2 Sensitive     CEFAZOLIN * <=4 Not Reportable      * For infections other than uncomplicated UTI caused by E. coli, K. pneumoniae or P. mirabilis: Cefazolin  is resistant if MIC > or = 8 mcg/mL. (Distinguishing susceptible versus intermediate for isolates with MIC < or = 4 mcg/mL requires additional testing.) For uncomplicated UTI caused by E. coli, K. pneumoniae or P. mirabilis: Cefazolin  is susceptible if MIC <32 mcg/mL and predicts susceptible to the oral agents cefaclor, cefdinir, cefpodoxime, cefprozil, cefuroxime, cephalexin  and loracarbef.     CEFTAZIDIME <=1 Sensitive     CEFEPIME <=1 Sensitive     CEFTRIAXONE <=1 Sensitive     CIPROFLOXACIN  <=0.25 Sensitive     LEVOFLOXACIN <=0.12 Sensitive     GENTAMICIN  <=1 Sensitive     IMIPENEM <=0.25 Sensitive     NITROFURANTOIN  <=16 Sensitive     PIP/TAZO <=4 Sensitive  TOBRAMYCIN <=1 Sensitive     TRIMETH /SULFA * <=20 Sensitive      * For infections other than uncomplicated UTI caused by E. coli, K. pneumoniae or P. mirabilis: Cefazolin  is resistant if MIC > or = 8 mcg/mL. (Distinguishing susceptible versus intermediate for isolates with MIC < or = 4 mcg/mL requires additional testing.) For uncomplicated UTI caused by E. coli, K. pneumoniae or P. mirabilis: Cefazolin  is susceptible if MIC <32 mcg/mL and predicts susceptible to the oral agents cefaclor, cefdinir, cefpodoxime, cefprozil, cefuroxime, cephalexin  and loracarbef. Legend: S = Susceptible  I = Intermediate R = Resistant  NS =  Not susceptible * = Not tested  NR = Not reported **NN = See antimicrobic comments     Labs: CBC: Recent Labs  Lab 06/14/24 0439 06/15/24 0518  WBC 10.3 9.5  NEUTROABS 7.7  --   HGB 15.9* 14.7  HCT 45.5 43.9  MCV 89.9 92.4  PLT 236 214   Basic Metabolic Panel: Recent Labs  Lab 06/14/24 0439 06/15/24 0518 06/16/24 0347  NA 136 139 141  K 3.1* 3.5 3.3*  CL 102 103 103  CO2 27 27 27   GLUCOSE 188* 97 110*  BUN 13 10 8   CREATININE 1.17* 0.93 0.87  CALCIUM  8.8* 8.2* 8.2*  MG  --   --  2.0   Liver Function Tests: Recent Labs  Lab 06/14/24 0439 06/15/24 0518 06/16/24 0347  AST 19 22 23   ALT 16 17 19   ALKPHOS 91 71 66  BILITOT 1.1 1.5* 1.5*  PROT 7.5 6.9 6.5  ALBUMIN  3.8 3.3* 3.1*   CBG: Recent Labs  Lab 06/15/24 0815 06/15/24 1301 06/15/24 1709 06/15/24 2047 06/16/24 0834  GLUCAP 104* 183* 109* 140* 104*    Discharge time spent: greater than 30 minutes.  This record has been created using Conservation officer, historic buildings. Errors have been sought and corrected,but may not always be located. Such creation errors do not reflect on the standard of care.   Signed: Amaryllis Dare, MD [facility] 06/16/2024

## 2024-06-16 NOTE — Progress Notes (Signed)
 Patient noted walking out of room at 1123 behind visitor after ringing her call bell and stating she was going to just walk out. Patient has discharge orders, appears A&O. Gait is steady.

## 2024-06-17 ENCOUNTER — Ambulatory Visit: Admitting: Family Medicine

## 2024-06-17 ENCOUNTER — Encounter: Payer: Self-pay | Admitting: Family Medicine

## 2024-06-17 ENCOUNTER — Ambulatory Visit: Payer: Self-pay | Admitting: Family Medicine

## 2024-06-17 ENCOUNTER — Telehealth: Payer: Self-pay

## 2024-06-17 VITALS — BP 128/86 | HR 104 | Temp 98.2°F | Ht 64.0 in | Wt 210.1 lb

## 2024-06-17 DIAGNOSIS — E1165 Type 2 diabetes mellitus with hyperglycemia: Secondary | ICD-10-CM | POA: Diagnosis not present

## 2024-06-17 DIAGNOSIS — I10 Essential (primary) hypertension: Secondary | ICD-10-CM

## 2024-06-17 DIAGNOSIS — E119 Type 2 diabetes mellitus without complications: Secondary | ICD-10-CM

## 2024-06-17 DIAGNOSIS — K859 Acute pancreatitis without necrosis or infection, unspecified: Secondary | ICD-10-CM

## 2024-06-17 DIAGNOSIS — Z794 Long term (current) use of insulin: Secondary | ICD-10-CM

## 2024-06-17 LAB — CBC WITH DIFFERENTIAL/PLATELET
Basophils Absolute: 0 10*3/uL (ref 0.0–0.1)
Basophils Relative: 0.3 % (ref 0.0–3.0)
Eosinophils Absolute: 0.3 10*3/uL (ref 0.0–0.7)
Eosinophils Relative: 3 % (ref 0.0–5.0)
HCT: 41.9 % (ref 36.0–46.0)
Hemoglobin: 14.2 g/dL (ref 12.0–15.0)
Lymphocytes Relative: 6.7 % — ABNORMAL LOW (ref 12.0–46.0)
Lymphs Abs: 0.6 10*3/uL — ABNORMAL LOW (ref 0.7–4.0)
MCHC: 34 g/dL (ref 30.0–36.0)
MCV: 90.8 fl (ref 78.0–100.0)
Monocytes Absolute: 0.6 10*3/uL (ref 0.1–1.0)
Monocytes Relative: 6.7 % (ref 3.0–12.0)
Neutro Abs: 7.9 10*3/uL — ABNORMAL HIGH (ref 1.4–7.7)
Neutrophils Relative %: 83.3 % — ABNORMAL HIGH (ref 43.0–77.0)
Platelets: 254 10*3/uL (ref 150.0–400.0)
RBC: 4.62 Mil/uL (ref 3.87–5.11)
RDW: 13.5 % (ref 11.5–15.5)
WBC: 9.5 10*3/uL (ref 4.0–10.5)

## 2024-06-17 LAB — BASIC METABOLIC PANEL WITH GFR
BUN: 11 mg/dL (ref 6–23)
CO2: 33 meq/L — ABNORMAL HIGH (ref 19–32)
Calcium: 8.7 mg/dL (ref 8.4–10.5)
Chloride: 96 meq/L (ref 96–112)
Creatinine, Ser: 1.06 mg/dL (ref 0.40–1.20)
GFR: 56.68 mL/min — ABNORMAL LOW (ref >60.00)
Glucose, Bld: 326 mg/dL — ABNORMAL HIGH (ref 70–99)
Potassium: 3.5 meq/L (ref 3.5–5.1)
Sodium: 136 meq/L (ref 135–145)

## 2024-06-17 LAB — HEPATIC FUNCTION PANEL
ALT: 16 U/L (ref 0–35)
AST: 14 U/L (ref 0–37)
Albumin: 3.5 g/dL (ref 3.5–5.2)
Alkaline Phosphatase: 87 U/L (ref 39–117)
Bilirubin, Direct: 0.3 mg/dL (ref 0.0–0.3)
Total Bilirubin: 0.9 mg/dL (ref 0.2–1.2)
Total Protein: 6.7 g/dL (ref 6.0–8.3)

## 2024-06-17 LAB — LIPASE: Lipase: 221 U/L — ABNORMAL HIGH (ref 11.0–59.0)

## 2024-06-17 NOTE — Assessment & Plan Note (Signed)
 bp in fair control at this time  Back on blood pressure med since hospitalization and headache is better  BP Readings from Last 1 Encounters:  06/17/24 128/86   No changes needed Most recent labs reviewed  Disc lifstyle change with low sodium diet and exercise  Plan to continue Losartan  hct 100-25 mg daily  Metoprolol  xl 50 mg daily

## 2024-06-17 NOTE — Transitions of Care (Post Inpatient/ED Visit) (Signed)
   06/17/2024  Name: Diana Deleon MRN: [id number] DOB: [birth year]  Today's TOC FU Call Status: Today's TOC FU Call Status:: Unsuccessful Call (1st Attempt) Unsuccessful Call (1st Attempt) Date: 06/17/24  Attempted to reach the patient regarding the most recent Inpatient/ED visit. Left a HIPAA approved voicemail message to phone number provided in demographics per DPR.    Follow Up Plan: Additional outreach attempts will be made to reach the patient to complete the Transitions of Care (Post Inpatient/ED visit) call.   Richerd Fish, RN, BSN, CCM [facility], [facility] RN Care Manager Direct Dial: [phone]

## 2024-06-17 NOTE — Assessment & Plan Note (Signed)
 Hospitalized recently  Reviewed hospital records, lab results and studies in detail   Did not req NG tube  Is drinking fluids and eating a little  Still having some mid abdominal pain, nausea is improved  Trying to hydrate  Some tenderness on exam  Has appointment with gI tomorrow and will likely plan mrcp in a month   Off trulicity  now -will not re start Holding statin for liver tests as well  Re check chem/ lft and lipase today

## 2024-06-17 NOTE — Patient Instructions (Signed)
 Stay off trulicity   Hold crestor  for now   Lab today   If symptoms become severe , go to the ER   See GI tomorrow as planned   Call the pharmacist make an appointment to discuss diabetes

## 2024-06-17 NOTE — Assessment & Plan Note (Signed)
 Trulicity  stopped due to episode of pancreatitis   Encouraged pt to reach out to pharmacist for a visit Diet limited by picky eating Continues emglee and glipizide  xl

## 2024-06-17 NOTE — Progress Notes (Signed)
 Subjective:    Patient ID: Diana Deleon, female    DOB: 26-Jul-1961, 63 y.o.   MRN: [id number]  HPI  Wt Readings from Last 3 Encounters:  06/17/24 210 lb 2 oz (95.3 kg)  06/14/24 210 lb (95.3 kg)  04/21/24 208 lb (94.3 kg)   36.07 kg/m  Vitals:   06/17/24 0946  BP: 128/86  Pulse: (!) 104  Temp: 98.2 F (36.8 C)  SpO2: 99%    Pt presents for follow up of hospitalization from 6/29 to 7/1 for acute pancreatitis   Presented with chest and epigastric pain  Lipase was 1818  T bili 1.5 K 3.1  Normal cxr  CT noted some peri pancreatic fat stranding and trace amt of fluid / consistent with acute pancreatitis   Recommended repeat pancreatic MRI in 3-4 weeks , then if concern needs EUS/ ERCP and biopsy  Holding trulicity  Holding crestor    A/p from hosp disch summary    * Acute pancreatitis Clinically seems improving.  GI was consulted due to concerning MRCP, no underlying risk for pancreatitis at this time.  Patient does not use alcohol, lipid panel normal, no new medications or supplement.  No prior incidence.  Liver enzymes normal except mildly elevated T. Bili. Nausea vomiting has been improved. Tolerating soft diet. -GI is recommending repeating MRCP in 3 to 4 weeks and then reassess -Continue supportive care -Will advance diet as tolerated   Anemia Hemoglobin stable. - Continue to monitor   Diabetes mellitus without complication (HCC) Patient uses Trulicity  and glipizide  at home. -Continue with SSI   Hypertension BP mildly elevated. -Continue home metoprolol  and Hyzaar   Hypothyroidism - Continue with syntheroid   Hyperlipidemia -Continue with statin   Cancer of right breast (HCC) S/P Mastectomy.Completed treatment. -Out patient follow up.   Class II obesity Estimated body mass index is 36.05 kg/m as calculated from the following:   Height as of this encounter: 5' 4 (1.626 m).   Weight as of this encounter: 95.3 kg.    -This will complicate  overall prognosis. -Encourage wt loss    Still tired Still some pain /not as bad as it was initially  About 7 out of 10 Nausea is 0-1  No vomiting  No diarrhea or constipation   No jaundice   No headache today   Drinking fluids/ water   Feels very full and bloated   Is eating some  Cheerios this am  Taco last night   Not much appetite     Symptoms do not get worse when she eats     Had a headache the whole time she was in the hospital  Thinks they held her blood pressure medicine         Before she got sick/ had really good numbers  Was eating as healthy as she can eat (severely picky)  Eating less/eats like a bird   Exercise- chair yoga     DM2  Lab Results  Component Value Date   HGBA1C 7.8 (H) 04/17/2024   HGBA1C 8.8 (A) 01/23/2024   HGBA1C 7.9 (A) 10/21/2023    Trulicity  Semglee  Glipizide  xl 10 mg daily    Sees GI tomorrow [facility]      Lab Results  Component Value Date   NA 141 06/16/2024   K 3.3 (L) 06/16/2024   CO2 27 06/16/2024   GLUCOSE 110 (H) 06/16/2024   BUN 8 06/16/2024   CREATININE 0.87 06/16/2024   CALCIUM  8.2 (L) 06/16/2024   GFR 52.55 (  L) 04/17/2024   EGFR 75 (L) 06/27/2015   GFRNONAA >60 06/16/2024   Lab Results  Component Value Date   ALT 19 06/16/2024   AST 23 06/16/2024   ALKPHOS 66 06/16/2024   BILITOT 1.5 (H) 06/16/2024   Lab Results  Component Value Date   WBC 9.5 06/15/2024   HGB 14.7 06/15/2024   HCT 43.9 06/15/2024   MCV 92.4 06/15/2024   PLT 214 06/15/2024   Lab Results  Component Value Date   LIPASE 126 (H) 06/16/2024    MRCP:  IMPRESSION: 1. Mild, diffuse inflammatory fat stranding and fluid about the pancreas and adjacent retroperitoneum, consistent with acute pancreatitis. No evidence of pancreatic parenchymal necrosis or acute pancreatic fluid collection. 2. The pancreatic duct is focally effaced along a short segment within the central pancreatic head, reconstituted near the  ampulla. Subtle, focal hypoenhancement in this vicinity without definitive evidence of mass, and this may reflect focal edema in the setting of pancreatitis, however this must be regarded with substantial suspicion for underlying mass. Consider ERCP/EUS to further assess the pancreatic head as well as short interval follow-up imaging in 1-2 months following resolution of acute clinical presentation. 3. Status post cholecystectomy.  No biliary ductal dilatation. 4. Mild hepatic steatosis. 5. Status post right mastectomy.     Today  Feeling better Back on her blood pressure medicines      Patient Active Problem List   Diagnosis Date Noted   Abnormal MRI of abdomen 06/15/2024   Anemia    Acute pancreatitis 06/14/2024   Diabetes mellitus without complication (HCC) 08/12/2023   Chronic kidney disease (CKD) stage G3a/A1, moderately decreased glomerular filtration rate (GFR) between 45-59 mL/min/1.73 square meter and albuminuria creatinine ratio less than 30 mg/g (HCC) 04/18/2023   History of melanoma in situ 04/18/2023   Urinary frequency 04/18/2023   Vitamin D  deficiency 04/08/2023   Restless legs 04/04/2022   S/P TKR (total knee replacement) using cement, right 02/13/2022   Current use of proton pump inhibitor 05/09/2021   S/P TKR (total knee replacement) using cement, left 06/21/2020   Osteoarthritis 04/28/2020   Hyperlipidemia 11/02/2018   Low back pain 03/21/2018   Osteopenia 01/05/2018   Arthralgia 07/03/2017   Chronic pain of left knee 07/03/2017   Psoriasis 11/02/2016   Colon cancer screening 11/02/2016   Class II obesity 10/31/2015   Cancer of right breast (HCC) 11/25/2014   History of breast cancer 10/25/2014   Type 2 diabetes mellitus with hyperglycemia (HCC) 07/14/2013   Vaginal intraepithelial neoplasia grade 1 07/09/2013   Routine general medical examination at a health care facility 10/12/2012   Hypothyroidism 06/03/2012   Overactive bladder 05/27/2012    HSV infection 07/07/2010   LOW BACK PAIN, CHRONIC 05/17/2008   Headache 05/17/2008   Hypertension 02/12/2008   Past Medical History:  Diagnosis Date   Anemia    off and on   Arthritis    right toes (03/24/2015)   Cancer of right breast (HCC) 2015   Cyst of cystic duct    chest- central location, taking Cephalexin  currently   Diabetes mellitus without complication (HCC)    Fibroids    hyst. 2006   GERD (gastroesophageal reflux disease)    Headache    History of abnormal Pap smear    has had leep in past   History of cold sores    has used valtrex  in past   History of kidney stones    History of recurrent UTIs    Hypertension  Hypothyroidism    Intermittent vertigo    Seborrheic dermatitis    local on R scalp and in ear (failed mult meds- uses steroid spray)   Thyroid  disease    Past Surgical History:  Procedure Laterality Date   BREAST RECONSTRUCTION WITH PLACEMENT OF TISSUE EXPANDER AND FLEX HD (ACELLULAR HYDRATED DERMIS) Right 11/25/2014   Procedure: RIGHT BREAST RECONSTRUCTION WITH PLACEMENT OF TISSUE EXPANDER AND USE OF ACELLULAR DERMRAL MATRIX ;  Surgeon: Alm Sick, MD;  Location: [facility] OR;  Service: Plastics;  Laterality: Right;   BREAST REDUCTION SURGERY Left 03/24/2015   Procedure: LEFT BREAST REDUCTION  (BREAST);  Surgeon: Alm Sick, MD;  Location: [facility] OR;  Service: Plastics;  Laterality: Left;   BREAST SURGERY Right    mastectomy  lymph node removal   COLONOSCOPY     COLONOSCOPY WITH PROPOFOL  N/A 12/28/2016   Procedure: COLONOSCOPY WITH PROPOFOL ;  Surgeon: Ruel Kung, MD;  Location: [facility] ENDOSCOPY;  Service: Endoscopy;  Laterality: N/A;   LAPAROSCOPIC CHOLECYSTECTOMY  2003   LITHOTRIPSY  ~ 2007   REMOVAL OF TISSUE EXPANDER Right 03/24/2015   REMOVAL OF TISSUE EXPANDER AND PLACEMENT OF IMPLANT Right 03/24/2015   w/delayed construction   REMOVAL OF TISSUE EXPANDER AND PLACEMENT OF IMPLANT Right 03/24/2015   Procedure: REMOVAL OF RIGHT BREAST TISSUE EXPANDER AND  DELAYED BREAST RECONSTRUCTION WITH PLACEMENT OF IMPLANT;  Surgeon: Alm Sick, MD;  Location: [facility] OR;  Service: Plastics;  Laterality: Right;   SIMPLE MASTECTOMY WITH AXILLARY SENTINEL NODE BIOPSY Right 11/25/2014   Procedure: RIGHT TOTAL  MASTECTOMY WITH AXILLARY SENTINEL NODE BIOPSY ;  Surgeon: Morene Olives, MD;  Location: [facility] OR;  Service: General;  Laterality: Right;   TOTAL KNEE ARTHROPLASTY Left 06/21/2020   Procedure: LEFT TOTAL KNEE ARTHROPLASTY;  Surgeon: Kathlynn Sharper, MD;  Location: [facility] ORS;  Service: Orthopedics;  Laterality: Left;   TOTAL KNEE ARTHROPLASTY Right 02/13/2022   Procedure: TOTAL KNEE ARTHROPLASTY;  Surgeon: Kathlynn Sharper, MD;  Location: [facility] ORS;  Service: Orthopedics;  Laterality: Right;   TUBAL LIGATION     VAGINAL DELIVERY  x3   VAGINAL HYSTERECTOMY  2006   fibroids   Social History   Tobacco Use   Smoking status: Never   Smokeless tobacco: Never  Vaping Use   Vaping status: Never Used  Substance Use Topics   Alcohol use: No    Alcohol/week: 0.0 standard drinks of alcohol   Drug use: No   Family History  Problem Relation Age of Onset   Alcohol abuse Father    COPD Father    Hypertension Father    Hypertension Mother    Parkinson's disease Mother    Allergies  Allergen Reactions   Caltrate 600+D Plus Minerals [Calcium  600 + Minerals]     Nausea    Metformin  And Related Diarrhea   Norvasc [Amlodipine Besylate] Swelling   Current Outpatient Medications on File Prior to Visit  Medication Sig Dispense Refill   Calcium  Carb-Cholecalciferol  600-800 MG-UNIT TABS Take 2 tablets by mouth every evening.     Continuous Glucose Receiver (DEXCOM G6 RECEIVER) DEVI Use as directed for diabetes 1 each 0   Continuous Glucose Sensor (DEXCOM G6 SENSOR) MISC 1 each by Does not apply route as directed. 3 each 11   Continuous Glucose Transmitter (DEXCOM G6 TRANSMITTER) MISC Use as directed for diabetes 1 each 3   ergocalciferol  (VITAMIN D2) 1.25 MG (50000 UT)  capsule Take 1 capsule (50,000 Units total) by mouth once a week. 12 capsule 0   gabapentin  (  NEURONTIN ) 300 MG capsule TAKE 1 CAPSULE BY MOUTH EVERYDAY AT BEDTIME 90 capsule 0   glipiZIDE  (GLUCOTROL  XL) 10 MG 24 hr tablet TAKE 1 TABLET (10 MG TOTAL) BY MOUTH DAILY WITH BREAKFAST. 90 tablet 2   glucose blood (ONETOUCH VERIO) test strip USE TO CHECK BLOOD SUGAR ONCE DAILY AND AS NEEDED 100 each 2   insulin  glargine (SEMGLEE ) 100 UNIT/ML injection Inject 0.75 mLs (75 Units total) into the skin daily. 70 mL 1   levothyroxine  (SYNTHROID ) 50 MCG tablet TAKE 1 TABLET BY MOUTH EVERY DAY BEFORE BREAKFAST 90 tablet 2   losartan -hydrochlorothiazide  (HYZAAR) 100-25 MG tablet TAKE 1 TABLET BY MOUTH EVERY DAY 90 tablet 3   metoprolol  succinate (TOPROL -XL) 50 MG 24 hr tablet TAKE 1 TABLET BY MOUTH EVERY DAY WITH OR IMMEDIATELY FOLLOWING A MEAL 90 tablet 3   Multiple Vitamin (MULTIVITAMIN WITH MINERALS) TABS tablet Take 1 tablet by mouth every evening.     omeprazole  (PRILOSEC ) 20 MG capsule TAKE 1 CAPSULE BY MOUTH DAILY BEFORE BREAKFAST 90 capsule 3   ONETOUCH DELICA LANCETS FINE MISC Use to check blood sugar once daily and as needed (dx. E11.9) 100 each 1   oxyCODONE  (OXY IR/ROXICODONE ) 5 MG immediate release tablet Take 1 tablet (5 mg total) by mouth every 4 (four) hours as needed for moderate pain (pain score 4-6). 20 tablet 0   polyethylene glycol (MIRALAX  / GLYCOLAX ) 17 g packet Take 17 g by mouth daily. 14 each 0   potassium chloride  (KLOR-CON ) 10 MEQ tablet TAKE 1 TABLET BY MOUTH EVERY DAY 90 tablet 3   Risankizumab-rzaa (SKYRIZI, 150 MG DOSE, [place]) Inject 150 mcg into the skin every 3 (three) months. Take as directed (every 3 month)     valACYclovir  (VALTREX ) 500 MG tablet TAKE 1 TABLET(500 MG) BY MOUTH TWICE DAILY FOR 5 DAYS FOR OUTBREAK 10 tablet 3   venlafaxine  XR (EFFEXOR -XR) 75 MG 24 hr capsule TAKE 1 CAPSULE(75 MG) BY MOUTH DAILY WITH BREAKFAST 90 capsule 2   rosuvastatin  (CRESTOR ) 5 MG tablet TAKE 1  TABLET (5 MG TOTAL) BY MOUTH DAILY. (Patient not taking: Reported on 06/17/2024) 90 tablet 3   No current facility-administered medications on file prior to visit.    Review of Systems  Constitutional:  Positive for fatigue.  Gastrointestinal:  Positive for abdominal distention, abdominal pain and nausea. Negative for anal bleeding, constipation, diarrhea and vomiting.  Neurological:  Negative for dizziness and headaches.       Objective:   Physical Exam Constitutional:      General: She is not in acute distress.    Appearance: Normal appearance. She is well-developed. She is obese. She is not ill-appearing or diaphoretic.  HENT:     Head: Normocephalic and atraumatic.     Mouth/Throat:     Mouth: Mucous membranes are moist.  Eyes:     General: No scleral icterus.       Right eye: No discharge.        Left eye: No discharge.     Conjunctiva/sclera: Conjunctivae normal.     Pupils: Pupils are equal, round, and reactive to light.  Neck:     Thyroid : No thyromegaly.     Vascular: No carotid bruit or JVD.  Cardiovascular:     Rate and Rhythm: Normal rate and regular rhythm.     Heart sounds: Normal heart sounds.     No gallop.  Pulmonary:     Effort: Pulmonary effort is normal. No respiratory distress.  Breath sounds: Normal breath sounds. No wheezing or rales.  Abdominal:     General: Abdomen is protuberant. There is no distension or abdominal bruit.     Palpations: Abdomen is soft. There is no shifting dullness, fluid wave, hepatomegaly, splenomegaly, mass or pulsatile mass.     Tenderness: There is generalized abdominal tenderness. There is no right CVA tenderness, left CVA tenderness, guarding or rebound. Negative signs include Murphy's sign.     Comments: More tender epigastric than other areas   Musculoskeletal:     Cervical back: Normal range of motion and neck supple.     Right lower leg: No edema.     Left lower leg: No edema.  Lymphadenopathy:     Cervical: No  cervical adenopathy.  Skin:    General: Skin is warm and dry.     Coloration: Skin is not pale.     Findings: No rash.  Neurological:     Mental Status: She is alert.     Coordination: Coordination normal.     Deep Tendon Reflexes: Reflexes are normal and symmetric. Reflexes normal.  Psychiatric:        Mood and Affect: Mood normal.           Assessment & Plan:   Problem List Items Addressed This Visit       Cardiovascular and Mediastinum   Hypertension   bp in fair control at this time  Back on blood pressure med since hospitalization and headache is better  BP Readings from Last 1 Encounters:  06/17/24 128/86   No changes needed Most recent labs reviewed  Disc lifstyle change with low sodium diet and exercise  Plan to continue Losartan  hct 100-25 mg daily  Metoprolol  xl 50 mg daily         Digestive   Acute pancreatitis - Primary   Hospitalized recently  Reviewed hospital records, lab results and studies in detail   Did not req NG tube  Is drinking fluids and eating a little  Still having some mid abdominal pain, nausea is improved  Trying to hydrate  Some tenderness on exam  Has appointment with gI tomorrow and will likely plan mrcp in a month   Off trulicity  now -will not re start Holding statin for liver tests as well  Re check chem/ lft and lipase today        Relevant Orders   Basic metabolic panel with GFR   Hepatic function panel   CBC with Differential/Platelet   Lipase     Endocrine   Type 2 diabetes mellitus with hyperglycemia (HCC)   Diabetes mellitus without complication (HCC)   Trulicity  stopped due to episode of pancreatitis   Encouraged pt to reach out to pharmacist for a visit Diet limited by picky eating Continues emglee and glipizide  xl

## 2024-06-18 ENCOUNTER — Telehealth: Payer: Self-pay

## 2024-06-18 DIAGNOSIS — K859 Acute pancreatitis without necrosis or infection, unspecified: Secondary | ICD-10-CM | POA: Diagnosis not present

## 2024-06-18 NOTE — Transitions of Care (Post Inpatient/ED Visit) (Signed)
   06/18/2024  Name: Diana Deleon MRN: [id number] DOB: March 23, 1961  Today's TOC FU Call Status: Today's TOC FU Call Status:: Unsuccessful Call (2nd Attempt) Unsuccessful Call (2nd Attempt) Date: 06/18/24  Attempted to reach the patient regarding the most recent Inpatient/ED visit. Left a HIPAA approved voicemail message to phone number provided in demographics per DPR.    Follow Up Plan: Additional outreach attempts will be made to reach the patient to complete the Transitions of Care (Post Inpatient/ED visit) call.   Richerd Fish, RN, BSN, CCM [facility], [facility] Health RN Care Manager Direct Dial: [phone]

## 2024-06-22 ENCOUNTER — Telehealth: Payer: Self-pay

## 2024-06-22 NOTE — Transitions of Care (Post Inpatient/ED Visit) (Addendum)
 06/22/2024  Name: Diana Deleon MRN: [id number] DOB: [birth year]  Today's TOC FU Call Status: Today's TOC FU Call Status:: Successful TOC FU Call Completed TOC FU Call Complete Date: 06/22/24 Patient's Name and Date of Birth confirmed.  Transition Care Management Follow-up Telephone Call Date of Discharge: 06/16/24 Discharge Facility: [facility]) Type of Discharge: Inpatient Admission Primary Inpatient Discharge Diagnosis:: Acute pancreatitis How have you been since you were released from the hospital?: Better Any questions or concerns?: No  Items Reviewed: diet, follow up care   Medications Reviewed Today:  Patient states GI MD gave her Tramadol  for pain but doesn't have the dosage as she is at work today.  traMADoL  (ULTRAM ) 50 mg tablet  Indications: Acute pancreatitis without infection or necrosis, unspecified pancreatitis type (HHS-HCC) Take 1 tablet (50 mg total) by mouth every 6 (six) hours as needed for Pain for up to 5 days 20   Noted from [place] [facility] notes Medications Reviewed Today     Reviewed by Eilleen Richerd GRADE, RN (Registered Nurse) on 06/22/24 at 1518  Med List Status: <None>   Medication Order Taking? Sig Documenting Provider Last Dose Status Informant  Calcium  Carb-Cholecalciferol  600-800 MG-UNIT TABS [id number]  Take 2 tablets by mouth every evening. [provider]  Active Self  Continuous Glucose Receiver (DEXCOM G6 RECEIVER) DEVI [id number]  Use as directed for diabetes Tower, Laine LABOR, MD  Active Self  Continuous Glucose Sensor (DEXCOM G6 SENSOR) MISC [id number]  1 each by Does not apply route as directed. Tower, Laine LABOR, MD  Active Self  Continuous Glucose Transmitter (DEXCOM G6 TRANSMITTER) [place] [id number]  Use as directed for diabetes Tower, Laine LABOR, MD  Active Self  ergocalciferol  (VITAMIN D2) 1.25 MG (50000 UT) capsule [id number]  Take 1 capsule (50,000 Units total) by mouth once a week. Tower, Laine LABOR, MD  Active Self   gabapentin  (NEURONTIN ) 300 MG capsule [id number]  TAKE 1 CAPSULE BY MOUTH EVERYDAY AT BEDTIME Tower, Laine LABOR, MD  Active Self  glipiZIDE  (GLUCOTROL  XL) 10 MG 24 hr tablet [id number]  TAKE 1 TABLET (10 MG TOTAL) BY MOUTH DAILY WITH BREAKFAST. Tower, Laine LABOR, MD  Active Self  glucose blood (ONETOUCH VERIO) test strip [id number]  USE TO CHECK BLOOD SUGAR ONCE DAILY AND AS NEEDED Tower, Laine LABOR, MD  Active Self  insulin  glargine (SEMGLEE ) 100 UNIT/ML injection [id number]  Inject 0.75 mLs (75 Units total) into the skin daily. Tower, Laine LABOR, MD  Active Self  levothyroxine  (SYNTHROID ) 50 MCG tablet [id number]  TAKE 1 TABLET BY MOUTH EVERY DAY BEFORE BREAKFAST Tower, Laine LABOR, MD  Active Self  losartan -hydrochlorothiazide  (HYZAAR) 100-25 MG tablet [id number]  TAKE 1 TABLET BY MOUTH EVERY DAY Tower, Laine LABOR, MD  Active Self  metoprolol  succinate (TOPROL -XL) 50 MG 24 hr tablet [id number]  TAKE 1 TABLET BY MOUTH EVERY DAY WITH OR IMMEDIATELY FOLLOWING A MEAL Tower, Laine LABOR, MD  Active Self  Multiple Vitamin (MULTIVITAMIN WITH MINERALS) TABS tablet [id number]  Take 1 tablet by mouth every evening. [provider]  Active Self  omeprazole  (PRILOSEC ) 20 MG capsule [id number]  TAKE 1 CAPSULE BY MOUTH DAILY BEFORE BREAKFAST Tower, Laine LABOR, MD  Active Self  [employer] LANCETS FINE [place] [id number]  Use to check blood sugar once daily and as needed (dx. E11.9) Tower, Laine LABOR, MD  Active Self  oxyCODONE  (OXY IR/ROXICODONE ) 5 MG immediate release tablet [id number] Yes Take 1 tablet (5 mg total) by mouth every 4 (four) hours as  needed for moderate pain (pain score 4-6). Amin, Sumayya, MD  Active   polyethylene glycol (MIRALAX  / GLYCOLAX ) 17 g packet [id number]  Take 17 g by mouth daily. Amin, Sumayya, MD  Active   potassium chloride  (KLOR-CON ) 10 MEQ tablet [id number]  TAKE 1 TABLET BY MOUTH EVERY DAY Tower, Laine LABOR, MD  Active Self  Risankizumab-rzaa (SKYRIZI, 150 MG DOSE, ) [id number]  Inject 150 mcg into the skin  every 3 (three) months. Take as directed (every 3 month) [provider]  Active Self           Med Note CLAUD, MICHEAL DASEN   Fri Jan 26, 2022  8:41 AM) Last dose:01/26/22  rosuvastatin  (CRESTOR ) 5 MG tablet [id number]  TAKE 1 TABLET (5 MG TOTAL) BY MOUTH DAILY.  Patient not taking: Reported on 06/17/2024   Randeen Laine LABOR, MD  Active Self  valACYclovir  (VALTREX ) 500 MG tablet [id number]  TAKE 1 TABLET(500 MG) BY MOUTH TWICE DAILY FOR 5 DAYS FOR OUTBREAK Tower, Laine LABOR, MD  Active Self           Med Note NIKKI, MARIA   Sun Jun 14, 2024  9:32 AM) prn  venlafaxine  XR (EFFEXOR -XR) 75 MG 24 hr capsule [id number]  TAKE 1 CAPSULE(75 MG) BY MOUTH DAILY WITH BREAKFAST Tower, Laine LABOR, MD  Active Self            Follow up appointments reviewed: already had PCP visit and GI f/u completed need MRI    SDOH Interventions Today    Flowsheet Row Most Recent Value  SDOH Interventions   Food Insecurity Interventions Intervention Not Indicated  Housing Interventions Intervention Not Indicated  Transportation Interventions Intervention Not Indicated  Utilities Interventions Intervention Not Indicated    Richerd Fish, RN, BSN, CCM [place]  [facility], [facility] Health RN Care Manager Direct Dial: [phone]

## 2024-07-07 ENCOUNTER — Other Ambulatory Visit: Payer: Self-pay | Admitting: Gastroenterology

## 2024-07-07 DIAGNOSIS — K859 Acute pancreatitis without necrosis or infection, unspecified: Secondary | ICD-10-CM

## 2024-07-12 ENCOUNTER — Ambulatory Visit
Admission: RE | Admit: 2024-07-12 | Discharge: 2024-07-12 | Disposition: A | Discharge status: Home / Self Care | Source: Ambulatory Visit | Attending: Gastroenterology

## 2024-07-12 ENCOUNTER — Other Ambulatory Visit: Payer: Self-pay | Admitting: Gastroenterology

## 2024-07-12 DIAGNOSIS — K859 Acute pancreatitis without necrosis or infection, unspecified: Secondary | ICD-10-CM

## 2024-07-12 DIAGNOSIS — K760 Fatty (change of) liver, not elsewhere classified: Secondary | ICD-10-CM | POA: Diagnosis not present

## 2024-07-12 DIAGNOSIS — Z9049 Acquired absence of other specified parts of digestive tract: Secondary | ICD-10-CM | POA: Diagnosis not present

## 2024-07-12 MED ORDER — GADOBUTROL 1 MMOL/ML IV SOLN
9.0000 mL | Freq: Once | INTRAVENOUS | Status: AC | PRN
  Administered 2024-07-12: 9 mL via INTRAVENOUS

## 2024-08-20 DIAGNOSIS — Z01419 Encounter for gynecological examination (general) (routine) without abnormal findings: Secondary | ICD-10-CM | POA: Diagnosis not present

## 2024-08-20 DIAGNOSIS — Z1231 Encounter for screening mammogram for malignant neoplasm of breast: Secondary | ICD-10-CM | POA: Diagnosis not present

## 2024-08-20 DIAGNOSIS — Z13820 Encounter for screening for osteoporosis: Secondary | ICD-10-CM | POA: Diagnosis not present

## 2024-08-20 DIAGNOSIS — N390 Urinary tract infection, site not specified: Secondary | ICD-10-CM | POA: Diagnosis not present

## 2024-08-20 DIAGNOSIS — Z6835 Body mass index (BMI) 35.0-35.9, adult: Secondary | ICD-10-CM | POA: Diagnosis not present

## 2024-08-20 DIAGNOSIS — N760 Acute vaginitis: Secondary | ICD-10-CM | POA: Diagnosis not present

## 2024-08-20 DIAGNOSIS — Z1272 Encounter for screening for malignant neoplasm of vagina: Secondary | ICD-10-CM | POA: Diagnosis not present

## 2024-08-29 ENCOUNTER — Other Ambulatory Visit: Payer: Self-pay | Admitting: Family Medicine

## 2024-08-31 ENCOUNTER — Telehealth: Payer: Self-pay | Admitting: Family Medicine

## 2024-08-31 DIAGNOSIS — Z8719 Personal history of other diseases of the digestive system: Secondary | ICD-10-CM | POA: Diagnosis not present

## 2024-08-31 DIAGNOSIS — Z09 Encounter for follow-up examination after completed treatment for conditions other than malignant neoplasm: Secondary | ICD-10-CM | POA: Diagnosis not present

## 2024-08-31 NOTE — Telephone Encounter (Unsigned)
 Copied from CRM [id number]. Topic: Appointments - Scheduling Inquiry for Clinic >> Aug 31, 2024  1:50 PM Macario HERO wrote: Reason for CRM: Patient is calling to schedule an appointment with the pharmacist.

## 2024-08-31 NOTE — Telephone Encounter (Signed)
 Refilled times one Last time we discussed diabetes I prompted her to touch base with pharmacy services (referral was done prior but had missed that appointment in setting of a bout of pancreatitis)   I would still like her to reach out to pharmacy to re schedule (let me know if new referral is needed)   Then schedule follow up with me   Thanks

## 2024-08-31 NOTE — Telephone Encounter (Signed)
 Lantus   Last filled:  06/11/24, #70 mL Last OV:  06/17/24, HFU Next OV:  none

## 2024-09-01 ENCOUNTER — Encounter: Payer: Self-pay | Admitting: Pharmacist

## 2024-09-01 NOTE — Progress Notes (Signed)
 Brief Telephone Documentation Reason for Call: Patient left message requesting to schedule visit with pharmacist.   Summary of Call: Patient was referred to pharmacy for diabetes management in February though was not able to be reached after 3 attempts.   Called patient this morning regarding scheduling. Left voicemail with direct callback number. In addition, sent MyChart message.   Manuelita FABIENE Kobs, PharmD Clinical Pharmacist [facility] [phone]

## 2024-09-04 NOTE — Telephone Encounter (Signed)
 Left VM requesting pt to call the office back

## 2024-09-08 NOTE — Telephone Encounter (Signed)
Lvm asking pt to call back.  Need to relay Dr. Marliss Coots message.  ?

## 2024-09-10 ENCOUNTER — Other Ambulatory Visit: Payer: Self-pay | Admitting: Family Medicine

## 2024-09-10 NOTE — Telephone Encounter (Signed)
 100 mg not on med list but 300 mg Rx was filled on 06/24/23 #90 tab/ 0 refill  Last OV was a hospital f/u on 06/17/24

## 2024-09-10 NOTE — Telephone Encounter (Signed)
 I think she is taking the 300 mg now instead of 100  Not both  Please confirm that with hre Thanks

## 2024-09-11 MED ORDER — GABAPENTIN 300 MG PO CAPS: 300.0000 mg | ORAL_CAPSULE | Freq: Every evening | ORAL | 1 refills | Status: AC | PRN

## 2024-09-11 NOTE — Telephone Encounter (Signed)
 Pt said she isn't taking the 100 mg she's taking the 300 mg and needs that dose refilled. Rx for 100 mg declined and sent refill request for 300 mg back to PCP

## 2024-09-16 ENCOUNTER — Ambulatory Visit: Admitting: Physician Assistant

## 2024-09-16 ENCOUNTER — Encounter: Payer: Self-pay | Admitting: Physician Assistant

## 2024-09-16 VITALS — BP 126/82 | HR 81

## 2024-09-16 DIAGNOSIS — L821 Other seborrheic keratosis: Secondary | ICD-10-CM | POA: Diagnosis not present

## 2024-09-16 DIAGNOSIS — L578 Other skin changes due to chronic exposure to nonionizing radiation: Secondary | ICD-10-CM

## 2024-09-16 DIAGNOSIS — Z86006 Personal history of melanoma in-situ: Secondary | ICD-10-CM

## 2024-09-16 DIAGNOSIS — D1801 Hemangioma of skin and subcutaneous tissue: Secondary | ICD-10-CM | POA: Diagnosis not present

## 2024-09-16 DIAGNOSIS — L814 Other melanin hyperpigmentation: Secondary | ICD-10-CM | POA: Diagnosis not present

## 2024-09-16 DIAGNOSIS — W908XXA Exposure to other nonionizing radiation, initial encounter: Secondary | ICD-10-CM

## 2024-09-16 DIAGNOSIS — Z1283 Encounter for screening for malignant neoplasm of skin: Secondary | ICD-10-CM

## 2024-09-16 DIAGNOSIS — D229 Melanocytic nevi, unspecified: Secondary | ICD-10-CM

## 2024-09-16 NOTE — Progress Notes (Signed)
   New Patient Visit   Subjective  Diana Deleon is a 63 y.o. female NEW PATIENT who presents for the following:  Total Body Skin Exam (TBSE)  Patient present today for new patient visit for TBSE.The patient denies she has spots, moles and lesions to be evaluated, some may be new or changing and the patient may have concern these could be cancer.   Previously seen at [facility] in [place], [place]. Has history of melanoma in situ (right upper back) as well as psoriasis.      The following portions of the chart were reviewed this encounter and updated as appropriate: medications, allergies, medical history  Review of Systems:  No other skin or systemic complaints except as noted in HPI or Assessment and Plan.  Objective  Well appearing patient in no apparent distress; mood and affect are within normal limits.  A full examination was performed including scalp, head, eyes, ears, nose, lips, neck, chest, axillae, abdomen, back, buttocks, bilateral upper extremities, bilateral lower extremities, hands, feet, fingers, toes, fingernails, and toenails. All findings within normal limits unless otherwise noted below.     Relevant exam findings are noted in the Assessment and Plan.    Assessment & Plan   LENTIGINES, SEBORRHEIC KERATOSES, HEMANGIOMAS - Benign normal skin lesions - Benign-appearing - Call for any changes  MELANOCYTIC NEVI - Tan-brown and/or pink-flesh-colored symmetric macules and papules - Benign appearing on exam today - Observation - Call clinic for new or changing moles - Recommend daily use of broad spectrum spf 30+ sunscreen to sun-exposed areas.   ACTINIC DAMAGE - Chronic condition, secondary to cumulative UV/sun exposure - diffuse scaly erythematous macules with underlying dyspigmentation - Recommend daily broad spectrum sunscreen SPF 30+ to sun-exposed areas, reapply every 2 hours as needed.  - Staying in the shade or wearing long sleeves, sun glasses  (UVA+UVB protection) and wide brim hats (4-inch brim around the entire circumference of the hat) are also recommended for sun protection.  - Call for new or changing lesions.  SKIN CANCER SCREENING PERFORMED TODAY  HISTORY OF MELANOMA IN SITU  - No evidence of recurrence today - No lymphadenopathy - Recommend regular full body skin exams - Recommend daily broad spectrum sunscreen SPF 30+ to sun-exposed areas, reapply every 2 hours as needed.  - Call if any new or changing lesions are noted between office visits  - will request prior pathology records    PSORIASIS -- SCALP  Exam: clear today  - plan to hold off on treating with Sky[facility] as she has been off since early January. She will call if she desires to re-start.     Return in about 1 year (around 09/16/2025) for TBSE .   Documentation: I have reviewed the above documentation for accuracy and completeness, and I agree with the above.  Virgal Warmuth K, PA-C

## 2024-09-16 NOTE — Patient Instructions (Signed)

## 2024-09-25 ENCOUNTER — Other Ambulatory Visit

## 2024-09-25 NOTE — Progress Notes (Deleted)
 09/25/2024 Name: Diana Deleon MRN: [id number] DOB: Oct 11, 1961  Subjective  No chief complaint on file.   Reason for visit: ?  Diana Deleon is a 63 y.o. female with a history of diabetes (type 2), who presents today for an initial diabetes pharmacotherapy visit. They were referred by their PCP for management of diabetes.  Pertinent PMH also includes HTN, hx acute pancreatitis, hypothyroid, osteopenia, OAB, CKD, HLD.  Known DM Complications: nephropathy   Care Team: Primary Care Provider: Tower, Laine LABOR, MD   Date of Last Diabetes Related Visit: with PCP on ***  Recent Summary of Change: ***  Medication Access/Adherence: Prescription drug coverage: Payor: BLUE CROSS BLUE SHIELD / Plan: BCBS COMM PPO / Product Type: *No Product type* / .  - Reports that all medications are *** affordable.  - Current Patient Assistance: None*** - Medication Adherence: Patient reports missing doses of their medication occasionally.   Since Last visit / History of Present Illness: ?  Patient reports implementing*** plan from last visit. Denies adverse effects with titration of ***.   Notes Trulicity  was stopped due to acute pancreatitis with unknown other cause. Saw GI after who felt it likely wasn't related though it was never resumed. Also had stopped rosuvastatin  at this time.   Dexcom = 90 day supply $100 for transmitter, $100 for.  Reported DM Regimen: ?  Glipizide  XL 10 mg daily Lantus  75 units once daily (evening)  DM medications tried in the past:?  metformin  (side effects) Trulicity  (did well for a couple of years, stopped with pancreatitis episode of unknown cause)  SMBG: Dexcom (G6 - expensive) and glucometer  FBG 130s-150s.    Hypo/Hyperglycemia: ?  Symptoms of hypoglycemia since last visit:? Feels symptoms when sugars approach 100. Occasional lower readings 100s in the morning (1/month) Symptoms of hyperglycemia since last visit:? {Blank multiple:19197::yes,no,n/a,***} -  {symptoms; hyperglycemia:17903}  Reported Diet: Patient reports she is a very picky eater. Many food aversions (texture). Has a hard time with the diabetic diet. Does not eat vegetables other than salad.  Skips meals often. Usually always lunch/dinner.  Breakfast: usually skips Lunch: Half and hlaf packing leftovers vs out to eat Dinner: Half and hlaf packing leftovers vs out to eat Snacks: Rare Beverages: lots of water . 1 soda day (1-2 mini cans). Milk. No coffee/teas.   Exercise: ***  DM Prevention:  Statin: {Blank single:19197::***,Taking,Not taking,Intolerant to,Declines}; {Blank single:19197::low intensity,moderate intensity,high intensity,n/a}.?  History of chronic kidney disease? yes History of albuminuria? Unknown, last UACR = never checked - DUE ACE/ARB - {Blank single:19197::***,Taking,Not taking,Intolerant to,Declines} ***; Urine MA/CR Ratio - {Blank single:19197::<30 mg/g,elevated,n/a,***}.  Last eye exam: Last in January 2025 - No retinopathy  Last foot exam: 10/21/2023 Tobacco Use: Never smoker  Immunizations:? Flu: Due (Last: 10/10/2023); Pneumococcal: PPSV23 (01/2019); Shingrix: No Record - DUE  Cardiovascular Risk Reduction History of clinical ASCVD? no The 10-year ASCVD risk score (Arnett DK, et al., 2019) is: 6.6% History of heart failure? no History of hyperlipidemia? yes Current BMI: *** kg/m2 (Ht *** in, Wt 95.3 kg) Taking statin? no; {Blank single:19197::low intensity,moderate intensity,high intensity,n/a} (***) Taking aspirin ? not indicated; Not taking   Taking SGLT-2i? no Taking GLP- 1 RA? no      _______________________________________________  Objective    Review of Systems:? Limited in the setting of virtual visit *** Constitutional:? No fever, chills or unintentional weight loss  Cardiovascular:? No chest pain or pressure, shortness of breath, dyspnea on exertion, orthopnea or LE edema  Pulmonary:? No  cough or shortness of  breath  GI:? No nausea, vomiting, constipation, diarrhea, abdominal pain, dyspepsia, change in bowel habits  Endocrine:? No polyuria, polyphagia or blurred vision    Physical Examination:  Vitals:  Wt Readings from Last 3 Encounters:  06/17/24 210 lb 2 oz (95.3 kg)  06/14/24 210 lb (95.3 kg)  04/21/24 208 lb (94.3 kg)   BP Readings from Last 3 Encounters:  09/16/24 126/82  06/17/24 128/86  06/16/24 (!) 152/85   Pulse Readings from Last 3 Encounters:  09/16/24 81  06/17/24 (!) 104  06/16/24 100     Labs:?  Lab Results  Component Value Date   HGBA1C 7.8 (H) 04/17/2024   HGBA1C 8.8 (A) 01/23/2024   HGBA1C 7.9 (A) 10/21/2023   GLUCOSE 326 (H) 06/17/2024   CREATININE 1.06 06/17/2024   CREATININE 0.87 06/16/2024   CREATININE 0.93 06/15/2024   GFR 56.68 (L) 06/17/2024   GFR 52.55 (L) 04/17/2024   GFR 58.34 (L) 08/08/2023    Lab Results  Component Value Date   CHOL 132 06/14/2024   LDLCALC 61 06/14/2024   LDLCALC 79 04/17/2024   LDLCALC 68 04/11/2023   HDL 56 06/14/2024   TRIG 75 06/14/2024   TRIG 93.0 04/17/2024   TRIG 95.0 04/11/2023   ALT 16 06/17/2024   ALT 19 06/16/2024   AST 14 06/17/2024   AST 23 06/16/2024     Chemistry      Component Value Date/Time   NA 136 06/17/2024 1034   NA 142 06/27/2015 0949   K 3.5 06/17/2024 1034   K 3.5 06/27/2015 0949   CL 96 06/17/2024 1034   CO2 33 (H) 06/17/2024 1034   CO2 30 (H) 06/27/2015 0949   BUN 11 06/17/2024 1034   BUN 15.1 06/27/2015 0949   CREATININE 1.06 06/17/2024 1034   CREATININE 0.9 06/27/2015 0949      Component Value Date/Time   CALCIUM  8.7 06/17/2024 1034   CALCIUM  9.3 06/27/2015 0949   ALKPHOS 87 06/17/2024 1034   ALKPHOS 88 06/27/2015 0949   AST 14 06/17/2024 1034   AST 19 06/27/2015 0949   ALT 16 06/17/2024 1034   ALT 22 06/27/2015 0949   BILITOT 0.9 06/17/2024 1034   BILITOT 0.45 06/27/2015 0949      The 10-year ASCVD risk score (Arnett DK, et al., 2019) is:  6.6%  Assessment and Plan:   1. Diabetes, type 2: uncontrolled per last A1c of 7.8% (04/17/24), decreased from previous, 8.8%. Goal <7% without hypoglycemia.  {lzcgmsmbg:31042} data shows ***. Current Regimen: *** Diet: *** Exercise: ***  HCM: Due for UACR Continue medications today without changes.  ***  Shingles vaccine at local pharmacy - patient plans to do  Reviewed s/sx/tx hypoglycemia Future Consideration: Baseline GFR ~52-56 GLP1-RA: Avoid in setting of acute pancreatitis SGLT2i: Reasonable - Hx OAB though A1c reasonably low. Expect up to ~1 point A1c reduction which would get patient to goal.  Metformin : Reports previous intolerance to metformin  ER.  Diarrhea.  SU: Not unreasonable, though defer given alternative options with lower risk of hypoglycemia.  TZD: Avoiding due to possible weight gain/increase in fracture risk. Insulin : Not unreasonable, though defer as last resort or as needed for severe hyperglycemia given risk hypoglycemia. Other safer options at this time.     2. HTN: controlled based on last clinic BP of 126/82 mmHg (09/16/24), goal <130/80 mmHg. Does not *** monitor BP at home. Denies lightheadedness, dizziness, SOB, CP, vision changes.  Current Regimen: *** Continue medications without changes.    3. ASCVD (  primary prevention): controlled on last lipid panel with LDL 61 mg/dL, TG 75 mg/dL (3/70/74). LDL goal <70 mg/dL (primary prevention, diabetes).  Key risk factors include: diabetes, hypertension, hyperlipidemia, and BMI >30 kg/m2 The 10-year ASCVD risk score (Arnett DK, et al., 2019) is: 6.6% Current Regimen: *** mg daily Continue medications today without changes.    4. Healthcare Maintenance:  Pneumococcal - Current status: ***  Shingles - Current status: *** Influenza - Current status: ***  Due to receive the following vaccines: {LZAPIMMUNIZATION:31028}   Follow Up Follow up with clinical pharmacist via *** in *** weeks Thursday or  Fridays Patient given direct line for questions regarding medication therapy *** PCP follow up December?   Future Appointments  Date Time Provider Department Center  09/20/2025  8:15 AM Orman Erminio POUR, PA-C [facility] None    Manuelita FABIENE Kobs, PharmD Clinical Pharmacist [facility] [phone]

## 2024-10-07 ENCOUNTER — Encounter: Payer: Self-pay | Admitting: Family Medicine

## 2024-10-07 ENCOUNTER — Other Ambulatory Visit: Payer: Self-pay | Admitting: Pharmacist

## 2024-10-07 DIAGNOSIS — E1165 Type 2 diabetes mellitus with hyperglycemia: Secondary | ICD-10-CM

## 2024-10-07 MED ORDER — DEXCOM G7 SENSOR MISC: 11 refills | Status: AC

## 2024-10-07 NOTE — Patient Instructions (Signed)
 [name],   It was a pleasure to speak with you today! As we discussed:?   You may reach me via replying to this MyChart Message, or you may leave me a voicemail at [phone] and I will get back to you shortly.   Continue current medications as you have been taking them  I can send the Dexcom G7 to Dr. Randeen and will check in with the pharmacy tomorrow to ensure it was received/covered.  Try a small protein snack at bed such as low-fat string cheese, almonds, low-sugar peanut butter.   If you do notice further low sugars under 70, we an discuss slightly reducing your Lantus  insulin .   We also discussed checking in the with eye doctor to see if they have thoughts/recommendations   Thank you!   Future Appointments  Date Time Provider Department Center  09/20/2025  8:15 AM Orman Erminio POUR, PA-C [facility] None   Diana Deleon, PharmD Clinical Pharmacist [facility] [phone] You may respond directly to this message, or leave me a voicemail at [phone] and I will get back to you shortly.

## 2024-10-07 NOTE — Progress Notes (Unsigned)
 09/25/2024 Name: Diana Deleon MRN: [id number] DOB: [birth year]  Subjective  No chief complaint on file.   Reason for visit: ?  Rheana Deleon is a 63 y.o. female with a history of diabetes (type 2), who presents today for a diabetes pharmacotherapy visit. They were referred by their PCP for management of diabetes.  Pertinent PMH also includes HTN, hx acute pancreatitis, hypothyroid, osteopenia, OAB, CKD, HLD.  Known DM Complications: nephropathy   Care Team: Primary Care Provider: Tower, Laine LABOR, MD  Medication Access/Adherence: Prescription drug coverage: Payor: BLUE CROSS BLUE SHIELD / Plan: BCBS COMM PPO / Product Type: *No Product type* / .  (Notes switching to [facility] starting 11/16/24)  History of Present Illness:  Notes Trulicity  was stopped due to acute pancreatitis with unknown other cause. Saw GI after who felt it likely wasn't related though it was never resumed. Also had stopped rosuvastatin  at this time.   Since Last visit: Patient reports recent change in sugar readings on Dexcom G6 including a couple of morning readings in the 70s which is not usual for her. Tends to have symptoms when sugars are in this range.   73 Monday, 65 Tuesday treated and resolved half cup of juice. Today, fasting 180s which she notes is not normal for her. Most days sugars are 130s.   Over past couple of weeks, notes vision has been more blurry. Has had blurry vision on and off the past few years. Last eye exam was May with no retinopathy per patient report.   Dexcom = 90 day supply $100 for transmitter, $100 for.  Reported DM Regimen: ?  Glipizide  XL 10 mg daily Lantus  75 units once daily (evening)  DM medications tried in the past:?  metformin  (side effects) Trulicity  (did well for a couple of years, stopped with pancreatitis episode of unknown cause)  SMBG: Dexcom (G6 - expensive) and glucometer  FBG 130s-150s.    Hypo/Hyperglycemia: ?  Symptoms of hypoglycemia since last visit:? Feels  symptoms when sugars approach 100. Occasional lower readings 100s in the morning (1/month) though 2 readings in the 70s this past week.  Symptoms of hyperglycemia since last visit:? Unclear - notes vision has been more blurry as of late. Recently picked up readers to help with vision while working.   Reported Diet: Patient reports she is a very picky eater. Many food aversions (texture). Has a hard time with the diabetic diet. Does not eat vegetables other than salad.  Skips meals often. Usually always lunch/dinner.  Breakfast: usually skips Lunch: Half and hlaf packing leftovers vs out to eat Dinner: Half and hlaf packing leftovers vs out to eat Snacks: Rare Beverages: lots of water . 1 soda day (1-2 mini cans). Milk. No coffee/teas.   Exercise: ***  DM Prevention:  Statin: {Blank single:19197::***,Taking,Not taking,Intolerant to,Declines}; {Blank single:19197::low intensity,moderate intensity,high intensity,n/a}.?  History of chronic kidney disease? yes History of albuminuria? Unknown, last UACR = never checked - DUE ACE/ARB - {Blank single:19197::***,Taking,Not taking,Intolerant to,Declines} ***; Urine MA/CR Ratio - {Blank single:19197::<30 mg/g,elevated,n/a,***}.  Last eye exam: Last in January 2025 - No retinopathy  Last foot exam: 10/21/2023 Tobacco Use: Never smoker  Immunizations:? Flu: Due (Last: 10/10/2023); Pneumococcal: PPSV23 (01/2019); Shingrix: No Record - DUE  Cardiovascular Risk Reduction History of clinical ASCVD? no The 10-year ASCVD risk score (Arnett DK, et al., 2019) is: 6.6% History of heart failure? no History of hyperlipidemia? yes Current BMI: *** kg/m2 (Ht *** in, Wt 95.3 kg) Taking statin? no; {Blank single:19197::low intensity,moderate intensity,high intensity,n/a} (***)  Taking aspirin ? not indicated; Not taking   Taking SGLT-2i? no Taking GLP- 1 RA? no    _______________________________________________   Objective    Review of Systems:? Limited in the setting of virtual visit *** Constitutional:? No fever, chills or unintentional weight loss  Cardiovascular:? No chest pain or pressure, shortness of breath, dyspnea on exertion, orthopnea or LE edema  Pulmonary:? No cough or shortness of breath  GI:? No nausea, vomiting, constipation, diarrhea, abdominal pain, dyspepsia, change in bowel habits  Endocrine:? No polyuria, polyphagia or blurred vision    Physical Examination:  Vitals:  Wt Readings from Last 3 Encounters:  06/17/24 210 lb 2 oz (95.3 kg)  06/14/24 210 lb (95.3 kg)  04/21/24 208 lb (94.3 kg)   BP Readings from Last 3 Encounters:  09/16/24 126/82  06/17/24 128/86  06/16/24 (!) 152/85   Pulse Readings from Last 3 Encounters:  09/16/24 81  06/17/24 (!) 104  06/16/24 100    Labs:?  Lab Results  Component Value Date   HGBA1C 7.8 (H) 04/17/2024   HGBA1C 8.8 (A) 01/23/2024   HGBA1C 7.9 (A) 10/21/2023   GLUCOSE 326 (H) 06/17/2024   CREATININE 1.06 06/17/2024   CREATININE 0.87 06/16/2024   CREATININE 0.93 06/15/2024   GFR 56.68 (L) 06/17/2024   GFR 52.55 (L) 04/17/2024   GFR 58.34 (L) 08/08/2023    Lab Results  Component Value Date   CHOL 132 06/14/2024   LDLCALC 61 06/14/2024   LDLCALC 79 04/17/2024   LDLCALC 68 04/11/2023   HDL 56 06/14/2024   TRIG 75 06/14/2024   TRIG 93.0 04/17/2024   TRIG 95.0 04/11/2023   ALT 16 06/17/2024   ALT 19 06/16/2024   AST 14 06/17/2024   AST 23 06/16/2024     Chemistry      Component Value Date/Time   NA 136 06/17/2024 1034   NA 142 06/27/2015 0949   K 3.5 06/17/2024 1034   K 3.5 06/27/2015 0949   CL 96 06/17/2024 1034   CO2 33 (H) 06/17/2024 1034   CO2 30 (H) 06/27/2015 0949   BUN 11 06/17/2024 1034   BUN 15.1 06/27/2015 0949   CREATININE 1.06 06/17/2024 1034   CREATININE 0.9 06/27/2015 0949      Component Value Date/Time   CALCIUM  8.7 06/17/2024 1034   CALCIUM  9.3 06/27/2015 0949   ALKPHOS 87 06/17/2024 1034    ALKPHOS 88 06/27/2015 0949   AST 14 06/17/2024 1034   AST 19 06/27/2015 0949   ALT 16 06/17/2024 1034   ALT 22 06/27/2015 0949   BILITOT 0.9 06/17/2024 1034   BILITOT 0.45 06/27/2015 0949      The 10-year ASCVD risk score (Arnett DK, et al., 2019) is: 6.6%  Assessment and Plan:   1. Diabetes, type 2: uncontrolled per last A1c of 7.8% (04/17/24), decreased from previous, 8.8%. Goal <7% without hypoglycemia.  {lzcgmsmbg:31042} data shows ***. Current Regimen: *** Diet: *** Exercise: ***  HCM: Due for UACR Continue medications today without changes.  ***  Shingles vaccine at local pharmacy - patient plans to do  Reviewed s/sx/tx hypoglycemia Future Consideration: Baseline GFR ~52-56 GLP1-RA: Avoid in setting of acute pancreatitis SGLT2i: Reasonable - Hx OAB though A1c reasonably low. Expect up to ~1 point A1c reduction which would get patient to goal.  Metformin : Reports previous intolerance to metformin  ER.  Diarrhea.  SU: Not unreasonable, though defer given alternative options with lower risk of hypoglycemia.  TZD: Avoiding due to possible weight gain/increase in fracture risk. Insulin :  Not unreasonable, though defer as last resort or as needed for severe hyperglycemia given risk hypoglycemia. Other safer options at this time.     2. HTN: controlled based on last clinic BP of 126/82 mmHg (09/16/24), goal <130/80 mmHg. Does not *** monitor BP at home. Denies lightheadedness, dizziness, SOB, CP, vision changes.  Current Regimen: *** Continue medications without changes.    3. ASCVD (primary prevention): controlled on last lipid panel with LDL 61 mg/dL, TG 75 mg/dL (3/70/74). LDL goal <70 mg/dL (primary prevention, diabetes).  Key risk factors include: diabetes, hypertension, hyperlipidemia, and BMI >30 kg/m2 The 10-year ASCVD risk score (Arnett DK, et al., 2019) is: 6.6% Current Regimen: *** mg daily Continue medications today without changes.    4. Healthcare Maintenance:   Pneumococcal - Current status: ***  Shingles - Current status: *** Influenza - Current status: ***  Due to receive the following vaccines: {LZAPIMMUNIZATION:31028}   Follow Up Follow up with clinical pharmacist via *** in *** weeks Thursday or Fridays Patient given direct line for questions regarding medication therapy *** PCP follow up December?   Future Appointments  Date Time Provider Department Center  09/20/2025  8:15 AM Orman Erminio POUR, PA-C [facility] None    Manuelita FABIENE Kobs, PharmD Clinical Pharmacist [facility] [phone]

## 2024-10-07 NOTE — Telephone Encounter (Signed)
 Will route to PCP and Manuelita

## 2024-11-05 ENCOUNTER — Other Ambulatory Visit: Payer: Self-pay | Admitting: Family Medicine

## 2025-09-20 ENCOUNTER — Ambulatory Visit: Admitting: Physician Assistant
